# Patient Record
Sex: Female | Born: 1960
Health system: Southern US, Community
[De-identification: ages and names within clinical notes are randomized; demographics above are authoritative.]

## PROBLEM LIST (undated history)

## (undated) DIAGNOSIS — T7840XA Allergy, unspecified, initial encounter: Secondary | ICD-10-CM

## (undated) DIAGNOSIS — M349 Systemic sclerosis, unspecified: Secondary | ICD-10-CM

## (undated) DIAGNOSIS — E559 Vitamin D deficiency, unspecified: Secondary | ICD-10-CM

## (undated) DIAGNOSIS — K219 Gastro-esophageal reflux disease without esophagitis: Secondary | ICD-10-CM

## (undated) DIAGNOSIS — L940 Localized scleroderma [morphea]: Secondary | ICD-10-CM

## (undated) DIAGNOSIS — K743 Primary biliary cirrhosis: Secondary | ICD-10-CM

## (undated) DIAGNOSIS — N920 Excessive and frequent menstruation with regular cycle: Secondary | ICD-10-CM

## (undated) DIAGNOSIS — M1990 Unspecified osteoarthritis, unspecified site: Secondary | ICD-10-CM

## (undated) DIAGNOSIS — I071 Rheumatic tricuspid insufficiency: Secondary | ICD-10-CM

## (undated) DIAGNOSIS — M358 Other specified systemic involvement of connective tissue: Secondary | ICD-10-CM

## (undated) DIAGNOSIS — Z9889 Other specified postprocedural states: Secondary | ICD-10-CM

## (undated) HISTORY — DX: Gastro-esophageal reflux disease without esophagitis: K21.9

## (undated) HISTORY — DX: Rheumatic tricuspid insufficiency: I07.1

## (undated) HISTORY — DX: Vitamin D deficiency, unspecified: E55.9

## (undated) HISTORY — DX: Other specified systemic involvement of connective tissue: M35.8

## (undated) HISTORY — DX: Localized scleroderma (morphea): L94.0

## (undated) HISTORY — PX: VEIN SURGERY: SHX48

## (undated) HISTORY — DX: Systemic sclerosis, unspecified: M34.9

## (undated) HISTORY — DX: Allergy, unspecified, initial encounter: T78.40XA

## (undated) HISTORY — DX: Primary biliary cirrhosis: K74.3

## (undated) HISTORY — DX: Excessive and frequent menstruation with regular cycle: N92.0

## (undated) HISTORY — DX: Other specified postprocedural states: Z98.890

---

## 1990-07-18 HISTORY — PX: DILATION AND CURETTAGE OF UTERUS: SHX78

## 1991-07-19 HISTORY — PX: DILATION AND CURETTAGE OF UTERUS: SHX78

## 2004-12-14 ENCOUNTER — Ambulatory Visit: Payer: Self-pay | Admitting: Internal Medicine

## 2005-07-14 ENCOUNTER — Ambulatory Visit: Payer: Self-pay | Admitting: Rheumatology

## 2005-12-29 ENCOUNTER — Ambulatory Visit: Payer: Self-pay | Admitting: Internal Medicine

## 2007-01-05 ENCOUNTER — Ambulatory Visit: Payer: Self-pay | Admitting: Internal Medicine

## 2008-01-09 ENCOUNTER — Ambulatory Visit: Payer: Self-pay | Admitting: Internal Medicine

## 2008-05-28 ENCOUNTER — Ambulatory Visit: Payer: Self-pay | Admitting: Internal Medicine

## 2009-01-13 ENCOUNTER — Ambulatory Visit: Payer: Self-pay | Admitting: Internal Medicine

## 2009-05-12 ENCOUNTER — Ambulatory Visit: Payer: Self-pay | Admitting: Unknown Physician Specialty

## 2009-05-15 ENCOUNTER — Ambulatory Visit: Payer: Self-pay | Admitting: Unknown Physician Specialty

## 2009-07-18 HISTORY — PX: DILATION AND CURETTAGE OF UTERUS: SHX78

## 2010-01-19 ENCOUNTER — Ambulatory Visit: Payer: Self-pay | Admitting: Unknown Physician Specialty

## 2011-02-01 ENCOUNTER — Ambulatory Visit: Payer: Self-pay | Admitting: Unknown Physician Specialty

## 2012-02-07 ENCOUNTER — Ambulatory Visit: Payer: Self-pay | Admitting: Internal Medicine

## 2012-02-09 ENCOUNTER — Ambulatory Visit: Payer: Self-pay | Admitting: Internal Medicine

## 2012-02-28 LAB — PULMONARY FUNCTION TEST

## 2012-04-06 ENCOUNTER — Ambulatory Visit: Payer: Self-pay | Admitting: Unknown Physician Specialty

## 2012-07-17 ENCOUNTER — Encounter: Payer: Self-pay | Admitting: Internal Medicine

## 2012-07-17 ENCOUNTER — Ambulatory Visit (INDEPENDENT_AMBULATORY_CARE_PROVIDER_SITE_OTHER): Payer: BC Managed Care – PPO | Admitting: Internal Medicine

## 2012-07-17 VITALS — BP 102/60 | HR 66 | Temp 97.8°F | Ht 64.0 in | Wt 177.2 lb

## 2012-07-17 DIAGNOSIS — I7300 Raynaud's syndrome without gangrene: Secondary | ICD-10-CM

## 2012-07-17 DIAGNOSIS — M349 Systemic sclerosis, unspecified: Secondary | ICD-10-CM

## 2012-07-18 ENCOUNTER — Encounter: Payer: Self-pay | Admitting: Internal Medicine

## 2012-07-18 NOTE — Progress Notes (Signed)
  Subjective:    Patient ID: Denise Arnold, female    DOB: May 02, 1961, 52 y.o.   MRN: [id number]  HPI 52 year old female with past history of scleroderma and raynaud's who comes in today for a scheduled follow up.  She states she is seeing Dr Janene Harvey.  Up to date with her breast, pelvic and pap smears.  Vitamin D was low.  On supplemental vitamin D.  Had her mammogram 7/13 and follow up views recommended.  Has her six month follow up 08/14/12.  She recently had an ulcer on her right third finger.  Saw Dr Gavin Potters.  Norvasc was increased to bid and she was placed on nitroglycerin cream.  The ulceration has improved.  Has follow up appt with Dr Gavin Potters 08/07/12.  Breathing test recently evaluated - ok.  Saw Dr Meredeth Ide.  Is being followed at [facility].  Has follow up 07/31/12.  Overall she feels she is doing well.    Past Medical History  Diagnosis Date  . Scleroderma     positive FANA, positive SCL-70 abs, raynauds, mild pulmonary hypertension, normal DLCO  . GERD (gastroesophageal reflux disease)   . Tricuspid regurgitation     Outpatient Encounter Prescriptions as of 07/17/2012  Medication Sig Dispense Refill  . amLODipine (NORVASC) 5 MG tablet Take 5 mg by mouth 2 (two) times daily.      . Calcium-Magnesium-Vitamin D (CALCIUM 500 PO) Take by mouth.      . fish oil-omega-3 fatty acids 1000 MG capsule Take 1 g by mouth daily.      . Multiple Vitamin (MULTIVITAMIN) tablet Take 1 tablet by mouth daily.      . nitroGLYCERIN (NITRO-BID) 2 % ointment Place 0.5 inches onto the skin 3 (three) times daily.        Review of Systems Patient denies any headache, lightheadedness or dizziness.  No sinus or allergy symptoms.   No chest pain, tightness or palpatitions.  No increased shortness of breath, cough or congestion.  No nausea or vomiting.  No acid reflux.  No abdominal pain or cramping.  No bowel change, such as diarrhea, constipation, BRBPR or melana.  No urine change.        Objective:   Physical Exam  Filed Vitals:   07/17/12 0932  BP: 102/60  Pulse: 66  Temp: 97.8 F (22.36 C)    52 year old female in no acute distress.   HEENT:  Nares - clear.  OP- without lesions or erythema.  NECK:  Supple, nontender.  No audible bruit.   HEART:  Appears to be regular. LUNGS:  Without crackles or wheezing audible.  Respirations even and unlabored.   RADIAL PULSE:  Equal bilaterally.  ABDOMEN:  Soft, nontender.  No audible abdominal bruit.   EXTREMITIES:  No increased edema to be present.  Healing ulcer right third finger.                     Assessment & Plan:  ABNORMAL MAMMOGRAM.  Has a six month follow up mammo 08/14/12.    LEG LESION.  Right leg.  She plans to discuss with her dermatologist at her 07/31/12 appt.    VASCULAR.  Saw Dr Guss Bunde.  Doing well.  Continue support hose.    GYN.  Sees Dr Janene Harvey.  Up to date with pelvic/breast exams.  Obtain records.   HEALTH MAINTENANCE.  Gyn through [place].  mammo as outlined.  Had colonoscopy 7-8/13.  Obtain records.

## 2012-07-18 NOTE — Assessment & Plan Note (Signed)
Sees Dr Gavin Potters.  Has the finger ulceration.  Improved.  Continue bid norvasc.  Continue nitroglycerin cream.  Improved.  Keep follow up with Dr Gavin Potters.

## 2012-07-18 NOTE — Assessment & Plan Note (Signed)
Following with Dr Kernodle.  Just evaluated by Dr Fleming.  Breathing test ok per pt.  Follow.     

## 2012-08-07 ENCOUNTER — Encounter: Payer: Self-pay | Admitting: Internal Medicine

## 2012-08-22 ENCOUNTER — Telehealth: Payer: Self-pay | Admitting: Internal Medicine

## 2012-08-22 ENCOUNTER — Ambulatory Visit: Payer: Self-pay | Admitting: Unknown Physician Specialty

## 2012-08-22 NOTE — Telephone Encounter (Signed)
Dr. Hyacinth Meeker put in the referral for the patient's mammogram and her results will be sent to [facility] . Patient's wants to make sure you request her mammogram report in order to receive the results from Dr. Lorin Picket.

## 2012-08-23 NOTE — Telephone Encounter (Signed)
Call [facility] to fax over results.

## 2012-08-27 ENCOUNTER — Telehealth: Payer: Self-pay | Admitting: Internal Medicine

## 2012-08-27 DIAGNOSIS — Z1239 Encounter for other screening for malignant neoplasm of breast: Secondary | ICD-10-CM

## 2012-08-27 DIAGNOSIS — Z1211 Encounter for screening for malignant neoplasm of colon: Secondary | ICD-10-CM

## 2012-08-27 NOTE — Telephone Encounter (Signed)
Patient will need a repeat mammogram done in 6 months from 2.5.14. She will need an order put in for her repeat mammogram and her report requested from [place].

## 2012-08-27 NOTE — Telephone Encounter (Signed)
Need to schedule mammo - follow up.

## 2012-08-27 NOTE — Telephone Encounter (Signed)
Please schedule her a 2 month follow up appt.  Can cancel tomorrow's appt.  Thanks.

## 2012-08-28 NOTE — Telephone Encounter (Signed)
Pt had a six month follow up mammo and ultrasound 08/22/12.  Read as BiRADS II.  Recommended return to yearly mammo - which would make her due for bilateral mammo 7/14.  Order placed for mammo.  Please schedule and let pt know.  Thanks.

## 2012-09-05 ENCOUNTER — Telehealth: Payer: Self-pay | Admitting: Internal Medicine

## 2012-09-05 NOTE — Telephone Encounter (Signed)
Pt left message checking on her 46month mammogram appointment

## 2012-09-05 NOTE — Telephone Encounter (Signed)
Patient is concerned she has a pinched nerve and would like to be seen. I have made her an apt on 3/7. Can we get her in any sooner? The patient is also concerned about her mammo.

## 2012-09-07 NOTE — Telephone Encounter (Signed)
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

## 2012-09-07 NOTE — Telephone Encounter (Signed)
I can see her on 09/14/12 at 2:00 - block 30 minutes.  Let me know if any problem with that appt time.  Confirm does not need to be seen earlier.

## 2012-09-07 NOTE — Telephone Encounter (Signed)
I left msg on home phone asking patient to return my call I was going to offer her an appointment for Monday 09/10/12.

## 2012-09-08 NOTE — Telephone Encounter (Signed)
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

## 2012-09-10 NOTE — Telephone Encounter (Signed)
Pt called and says something around 4 pm would be good if possible

## 2012-09-10 NOTE — Telephone Encounter (Signed)
See if she can come in on Tuesday 09/11/12 at 4:15.  She may have to wait.  Will just add her in at the end of the day.  Thanks.

## 2012-09-10 NOTE — Telephone Encounter (Signed)
Left message for pt to call office

## 2012-09-11 ENCOUNTER — Ambulatory Visit (INDEPENDENT_AMBULATORY_CARE_PROVIDER_SITE_OTHER): Payer: BC Managed Care – PPO | Admitting: Internal Medicine

## 2012-09-11 ENCOUNTER — Encounter: Payer: Self-pay | Admitting: Internal Medicine

## 2012-09-11 VITALS — BP 138/82 | HR 92 | Temp 97.9°F | Wt 175.0 lb

## 2012-09-11 DIAGNOSIS — M349 Systemic sclerosis, unspecified: Secondary | ICD-10-CM

## 2012-09-11 DIAGNOSIS — I7300 Raynaud's syndrome without gangrene: Secondary | ICD-10-CM

## 2012-09-12 ENCOUNTER — Telehealth: Payer: Self-pay | Admitting: Internal Medicine

## 2012-09-12 DIAGNOSIS — R928 Other abnormal and inconclusive findings on diagnostic imaging of breast: Secondary | ICD-10-CM

## 2012-09-12 NOTE — Telephone Encounter (Signed)
Patient needing a referral for a general surgeon.

## 2012-09-12 NOTE — Telephone Encounter (Signed)
Order placed for surgery referral for abnormal mammo

## 2012-09-20 ENCOUNTER — Ambulatory Visit: Payer: BC Managed Care – PPO | Admitting: Internal Medicine

## 2012-09-21 ENCOUNTER — Ambulatory Visit: Payer: BC Managed Care – PPO | Admitting: Internal Medicine

## 2012-09-23 ENCOUNTER — Encounter: Payer: Self-pay | Admitting: Internal Medicine

## 2012-09-23 NOTE — Assessment & Plan Note (Signed)
Following with Dr Gavin Potters.  Just evaluated by Dr Meredeth Ide.  Breathing test ok per pt.  Follow.

## 2012-09-23 NOTE — Progress Notes (Signed)
Subjective:    Patient ID: Denise Arnold, female    DOB: 1960/12/21, 52 y.o.   MRN: [id number]  Sinusitis  52 year old female with past history of scleroderma and raynaud's who comes in today as a work in with concerns regarding some neck and arm discomfort and some numbness.  She states she works on a [employer]im[employer] a lot.  Looks down.  If the turns her head, notices some left arm numbness.  Seeing a [facility].  Helping.  She also had questions and concerns regarding her mammo.  This was a six month follow up.  Read as a Birads II and recommended getting her back in for yearly mammo - which would be done in six months.  No change in her breast.  Otherwise doing well.       Past Medical History  Diagnosis Date  . Scleroderma     positive FANA, positive SCL-70 abs, raynauds, mild pulmonary hypertension, normal DLCO  . GERD (gastroesophageal reflux disease)   . Tricuspid regurgitation     Outpatient Encounter Prescriptions as of 09/11/2012  Medication Sig Dispense Refill  . omeprazole (PRILOSEC) 20 MG capsule Take 20 mg by mouth daily.      Marland Kitchen amLODipine (NORVASC) 5 MG tablet Take 5 mg by mouth 2 (two) times daily.      . Calcium-Magnesium-Vitamin D (CALCIUM 500 PO) Take by mouth.      . fish oil-omega-3 fatty acids 1000 MG capsule Take 1 g by mouth daily.      . Multiple Vitamin (MULTIVITAMIN) tablet Take 1 tablet by mouth daily.      . nitroGLYCERIN (NITRO-BID) 2 % ointment Place 0.5 inches onto the skin 3 (three) times daily.       No facility-administered encounter medications on file as of 09/11/2012.    Review of Systems Patient denies any headache, lightheadedness or dizziness.  No sinus or allergy symptoms.   No chest pain, tightness or palpatitions.  No increased shortness of breath, cough or congestion.  No nausea or vomiting.  No acid reflux.  No abdominal pain or cramping.  No bowel change, such as diarrhea, constipation, BRBPR or melana.  No urine change.  Arm pain and numbness as  outlined.        Objective:   Physical Exam   Filed Vitals:   09/11/12 1658  BP: 138/82  Pulse: 92  Temp: 97.9 F (36.9 C)   52 year old female in no acute distress.   HEENT:  Nares - clear.  OP- without lesions or erythema.  NECK:  Supple, nontender.  No audible bruit.  Increased pulling sensation with rotation of her head. HEART:  Appears to be regular. LUNGS:  Without crackles or wheezing audible.  Respirations even and unlabored.   RADIAL PULSE:  Equal bilaterally.  ABDOMEN:  Soft, nontender.  No audible abdominal bruit.   EXTREMITIES:  No increased edema to be present.  Healing ulcer right third finger.              MSK:  Grip strength normal.  No significant pain with full extension.         Assessment & Plan:  ABNORMAL MAMMOGRAM.  Just had follow up six month mammo.  Read as benign and recommended another six month follow up- which would be her bilateral mammo.  Discussed findings with patient today.  She is anxious.  Request referral to Dr Lemar Livings for his opinion regarding her mammogram.    ARM PAIN/NUMBNESS.  Symptoms and  exam as outlined.  Improving with seeing chiropractor.  Follow.  Will hold on further w/up at this point.  Will notify me if symptoms worsen or change or if she desires any further w/up.    VASCULAR.  Saw Dr Guss Bunde.  Doing well.  Continue support hose.    GYN.  Sees Dr Janene Harvey.  Up to date with pelvic/breast exams.  Obtain records.   HEALTH MAINTENANCE.  Gyn through [facility].  Mammo as outlined.  Had colonoscopy 7-8/13.  Obtain records.

## 2012-09-23 NOTE — Assessment & Plan Note (Signed)
Sees Dr Kernodle.  Has the finger ulceration.  Improved.  Continue bid norvasc.  Continue nitroglycerin cream.  Improved.  Keep follow up with Dr Kernodle.  

## 2012-10-01 ENCOUNTER — Telehealth: Payer: Self-pay | Admitting: Internal Medicine

## 2012-10-01 DIAGNOSIS — M542 Cervicalgia: Secondary | ICD-10-CM

## 2012-10-01 NOTE — Telephone Encounter (Signed)
Patient wanting a referral to a physical therapist . She has gone to [facility] on [address].

## 2012-10-01 NOTE — Telephone Encounter (Signed)
Left message to return call, concerning why patient needs physical therapy.

## 2012-10-01 NOTE — Telephone Encounter (Signed)
Discussed this the last time she was in, she has a pinched nerve in her neck and it goes down the left side of her body. Said she mentioned that she had been to a chiropractor but that did not seem to help but thought physical therapy would be better for her. Admit it did get better but over the wknd it has gotten and think physical therapy could do more for her.

## 2012-10-02 NOTE — Telephone Encounter (Signed)
Patient notified. Denise Arnold patient stated that she prefers to go back to [facility] on [address] [place]. Thanks

## 2012-10-02 NOTE — Telephone Encounter (Signed)
Order placed for physical therapy.   Please notify pt - she should be called with an appt time.

## 2012-10-03 ENCOUNTER — Telehealth: Payer: Self-pay | Admitting: Internal Medicine

## 2012-10-03 NOTE — Telephone Encounter (Signed)
Pt will come in Friday 3/21 @ 4.  Pt wanted to know if there was anything to suggest over the counter for pain until Friday  And patient stated PT was going to call her back this after  [facility]

## 2012-10-03 NOTE — Telephone Encounter (Signed)
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  Was given prednisone a few weeks ago and that helped but in the last day or two she is having a lot of pain again in her left side of her neck.  Would like advice on another medication she could try.

## 2012-10-03 NOTE — Telephone Encounter (Signed)
Given that her symptoms have flared again, I would like to reevaluate her to see what is next best step.  See if she can come in 4:00 on Friday 10/05/12 for evaluation.

## 2012-10-03 NOTE — Telephone Encounter (Signed)
Pt will be here frdiay 3/21

## 2012-10-03 NOTE — Telephone Encounter (Signed)
Can try tylenol extra strength - 2 tablets bid and advil or ibuprofen - 2 tablets q day to bid - take with food.

## 2012-10-04 NOTE — Telephone Encounter (Signed)
Leave patient a message she will be in class this afternoon. She is returning your call.

## 2012-10-04 NOTE — Telephone Encounter (Signed)
Left detailed message on patients voice mail per patient's request.

## 2012-10-04 NOTE — Telephone Encounter (Signed)
Left message for patient to return call on voicemail. 

## 2012-10-05 ENCOUNTER — Ambulatory Visit (INDEPENDENT_AMBULATORY_CARE_PROVIDER_SITE_OTHER): Payer: BC Managed Care – PPO | Admitting: Internal Medicine

## 2012-10-05 ENCOUNTER — Encounter: Payer: Self-pay | Admitting: Internal Medicine

## 2012-10-05 VITALS — BP 110/60 | HR 73 | Temp 98.4°F | Ht 64.0 in | Wt 178.5 lb

## 2012-10-05 DIAGNOSIS — M349 Systemic sclerosis, unspecified: Secondary | ICD-10-CM

## 2012-10-05 DIAGNOSIS — M79609 Pain in unspecified limb: Secondary | ICD-10-CM

## 2012-10-05 DIAGNOSIS — M542 Cervicalgia: Secondary | ICD-10-CM

## 2012-10-05 DIAGNOSIS — M79602 Pain in left arm: Secondary | ICD-10-CM

## 2012-10-05 MED ORDER — CYCLOBENZAPRINE HCL 5 MG PO TABS: 5.0000 mg | ORAL_TABLET | Freq: Every evening | ORAL | Status: DC | PRN

## 2012-10-07 ENCOUNTER — Encounter: Payer: Self-pay | Admitting: Internal Medicine

## 2012-10-07 NOTE — Progress Notes (Signed)
Subjective:    Patient ID: Denise Arnold, female    DOB: 10/27/1960, 52 y.o.   MRN: [id number]  Neck Pain   Sinusitis Associated symptoms include neck pain.  52 year old female with past history of scleroderma and raynaud's who comes in today as a work in with concerns regarding some neck and arm discomfort and some numbness.  She works on a Animator a lot.  Looks down.  If the turns her head, notices some left arm numbness and pain with looking up and looking to the side.  Has seen a chiropractor.  I saw her last visit for the neck and arm pain.  See that note for details.  Was given medrol dose pack.  This helped for a while and now has flared back up again.  More pain localized in her left upper arm and into her axilla.      Past Medical History  Diagnosis Date  . Scleroderma     positive FANA, positive SCL-70 abs, raynauds, mild pulmonary hypertension, normal DLCO  . GERD (gastroesophageal reflux disease)   . Tricuspid regurgitation     Outpatient Encounter Prescriptions as of 10/05/2012  Medication Sig Dispense Refill  . amLODipine (NORVASC) 5 MG tablet Take 5 mg by mouth 2 (two) times daily.      . Calcium-Magnesium-Vitamin D (CALCIUM 500 PO) Take by mouth.      . cyclobenzaprine (FLEXERIL) 5 MG tablet Take 1 tablet (5 mg total) by mouth at bedtime as needed for muscle spasms.  20 tablet  0  . fish oil-omega-3 fatty acids 1000 MG capsule Take 1 g by mouth daily.      . Multiple Vitamin (MULTIVITAMIN) tablet Take 1 tablet by mouth daily.      . nitroGLYCERIN (NITRO-BID) 2 % ointment Place 0.5 inches onto the skin 3 (three) times daily.      Marland Kitchen omeprazole (PRILOSEC) 20 MG capsule Take 20 mg by mouth daily.      . [DISCONTINUED] cyclobenzaprine (FLEXERIL) 5 MG tablet Take 5 mg by mouth at bedtime as needed for muscle spasms.       No facility-administered encounter medications on file as of 10/05/2012.    Review of Systems  HENT: Positive for neck pain.   Patient denies any headache,  lightheadedness or dizziness.  No sinus or allergy symptoms.   No chest pain, tightness or palpatitions.  No increased shortness of breath, cough or congestion.  No nausea or vomiting.  No acid reflux.  No abdominal pain or cramping.  No bowel change, such as diarrhea, constipation, BRBPR or melana.  No urine change.  Arm pain and numbness as outlined as well as neck and shoulder pain as outlined.  Hurts more with certain positions- i.e., turning her head and looking up, etc.        Objective:   Physical Exam   Filed Vitals:   10/05/12 1604  BP: 110/60  Pulse: 73  Temp: 98.4 F (65.64 C)   52 year old female in no acute distress.   HEENT:  Nares - clear.  OP- without lesions or erythema.  NECK:  Supple, nontender.  No audible bruit.  Increased pulling sensation with rotation of her head.  Increased pain in her left shoulder with looking up.  Increased pain in the left upper arm with looking from left to right.  HEART:  Appears to be regular. LUNGS:  Without crackles or wheezing audible.  Respirations even and unlabored.   RADIAL PULSE:  Equal  bilaterally.  ABDOMEN:  Soft, nontender.  No audible abdominal bruit.   EXTREMITIES:  No increased edema to be present.  Healing ulcer right third finger.              MSK:  Grip strength normal.  No significant pain with full extension.          Assessment & Plan:  ABNORMAL MAMMOGRAM.  Just had follow up six month mammo.  Read as benign and recommended another six month follow up- which would be her bilateral mammo.  See last note for details. Referred to Dr Lemar Livings.    ARM PAIN/NUMBNESS.  Symptoms and exam as outlined.  Improved with steroids.  Will give her another medrol dose pack.  Also, flexeril 5mg  q hs prn.  Will obtain MRI c-spine.  Review this prior to PT.    VASCULAR.  Saw Dr Guss Bunde.  Doing well.  Continue support hose.    GYN.  Sees Dr Janene Harvey.  Up to date with pelvic/breast exams.  Obtain records.   HEALTH MAINTENANCE.  Gyn  through [place].  Mammo as outlined.  Had colonoscopy 7-8/13.

## 2012-10-07 NOTE — Assessment & Plan Note (Addendum)
Following with Dr Kernodle.  Stable.   

## 2012-10-20 ENCOUNTER — Ambulatory Visit: Payer: Self-pay | Admitting: Internal Medicine

## 2012-10-24 ENCOUNTER — Telehealth: Payer: Self-pay | Admitting: Internal Medicine

## 2012-10-24 DIAGNOSIS — M542 Cervicalgia: Secondary | ICD-10-CM

## 2012-10-24 NOTE — Telephone Encounter (Signed)
Order placed for neurosurgery referrral.

## 2012-11-06 ENCOUNTER — Encounter: Payer: Self-pay | Admitting: Internal Medicine

## 2013-01-11 ENCOUNTER — Encounter: Payer: BC Managed Care – PPO | Admitting: Internal Medicine

## 2013-01-14 ENCOUNTER — Encounter: Payer: Self-pay | Admitting: Internal Medicine

## 2013-01-14 ENCOUNTER — Ambulatory Visit (INDEPENDENT_AMBULATORY_CARE_PROVIDER_SITE_OTHER): Payer: BC Managed Care – PPO | Admitting: Internal Medicine

## 2013-01-14 VITALS — BP 110/70 | HR 71 | Temp 98.1°F | Ht 64.5 in | Wt 179.0 lb

## 2013-01-14 DIAGNOSIS — I7300 Raynaud's syndrome without gangrene: Secondary | ICD-10-CM

## 2013-01-14 DIAGNOSIS — K219 Gastro-esophageal reflux disease without esophagitis: Secondary | ICD-10-CM

## 2013-01-14 DIAGNOSIS — M5030 Other cervical disc degeneration, unspecified cervical region: Secondary | ICD-10-CM

## 2013-01-14 DIAGNOSIS — E559 Vitamin D deficiency, unspecified: Secondary | ICD-10-CM

## 2013-01-14 DIAGNOSIS — M349 Systemic sclerosis, unspecified: Secondary | ICD-10-CM

## 2013-01-15 ENCOUNTER — Telehealth: Payer: Self-pay | Admitting: Internal Medicine

## 2013-01-15 ENCOUNTER — Encounter: Payer: Self-pay | Admitting: Internal Medicine

## 2013-01-15 DIAGNOSIS — E559 Vitamin D deficiency, unspecified: Secondary | ICD-10-CM | POA: Insufficient documentation

## 2013-01-15 DIAGNOSIS — M5030 Other cervical disc degeneration, unspecified cervical region: Secondary | ICD-10-CM | POA: Insufficient documentation

## 2013-01-15 DIAGNOSIS — K219 Gastro-esophageal reflux disease without esophagitis: Secondary | ICD-10-CM | POA: Insufficient documentation

## 2013-01-15 NOTE — Assessment & Plan Note (Signed)
On replacement.  Obtain lab results from Dr Janene Harvey.

## 2013-01-15 NOTE — Assessment & Plan Note (Signed)
Will increase omeprazole to bid.  Describes the intermittent swallowing issues.  Plans to discuss with Dr Gavin Potters.  Discussed with her regarding swallowing evaluation - especially given her history of scleroderma.  She prefers to discuss with Dr Gavin Potters.  Has an appt with him 7/14.

## 2013-01-15 NOTE — Assessment & Plan Note (Signed)
Neck and shoulder doing better.  Traction prn helps.  Saw Dr Stern.  Refer to his note for details.   

## 2013-01-15 NOTE — Assessment & Plan Note (Signed)
Sees Dr Kernodle.  Has the finger ulceration.  Improved.  Continue bid norvasc.  Keep follow up with Dr Kernodle.   

## 2013-01-15 NOTE — Assessment & Plan Note (Signed)
Following with Dr Kernodle.  Stable.   

## 2013-01-15 NOTE — Telephone Encounter (Signed)
Records received per Dr. Lorin Picket

## 2013-01-15 NOTE — Progress Notes (Signed)
Subjective:    Patient ID: Denise Arnold, female    DOB: 16-Jun-1961, 52 y.o.   MRN: [id number]  HPI 52 year old female with past history of scleroderma and raynaud's who comes in today for a scheduled follow up.  She states she is seeing Dr Janene Harvey.  Up to date with her breast, pelvic and pap smears.  Just evaluated 12/31/12.  Vitamin D was low.  On supplemental vitamin D.  Is scheduled to have her mammogram in 8/14.  She has an ulcer on her right third finger.  Saw Dr Gavin Potters.  Norvasc was increased to bid and she was placed on nitroglycerin cream.  The ulceration has improved, but still present.  Breathing stable.  Occasionally will notice some change with her swallowing.  No significant dysphagia.  Occasional reflux.  On prilosec q day.  Planning to discuss with Dr Gavin Potters.  We discussed further swallowing evaluation, given her history of scleroderma.  She prefers to hold on further testing and discuss with Dr Gavin Potters.  Has follow up planned with Dr Gavin Potters 7/14. Neck/arm better.  Saw neurosurgery (Dr Venetia Maxon).  Recommended traction.  Does this intermittently.  Doing better.      Past Medical History  Diagnosis Date  . Scleroderma     positive FANA, positive SCL-70 abs, raynauds, mild pulmonary hypertension, normal DLCO  . GERD (gastroesophageal reflux disease)   . Tricuspid regurgitation     Outpatient Encounter Prescriptions as of 01/14/2013  Medication Sig Dispense Refill  . amLODipine (NORVASC) 5 MG tablet Take 5 mg by mouth daily.       . Calcium-Magnesium-Vitamin D (CALCIUM 500 PO) Take by mouth.      . fish oil-omega-3 fatty acids 1000 MG capsule Take 1 g by mouth daily.      . Multiple Vitamin (MULTIVITAMIN) tablet Take 1 tablet by mouth daily.      Marland Kitchen omeprazole (PRILOSEC) 20 MG capsule Take 20 mg by mouth daily.      . Vitamin D, Ergocalciferol, (DRISDOL) 50000 UNITS CAPS Take 50,000 Units by mouth every 7 (seven) days.      . [DISCONTINUED] cyclobenzaprine (FLEXERIL) 5 MG tablet Take  1 tablet (5 mg total) by mouth at bedtime as needed for muscle spasms.  20 tablet  0  . [DISCONTINUED] nitroGLYCERIN (NITRO-BID) 2 % ointment Place 0.5 inches onto the skin 3 (three) times daily.       No facility-administered encounter medications on file as of 01/14/2013.    Review of Systems Patient denies any headache, lightheadedness or dizziness.  No sinus or allergy symptoms.   No chest pain, tightness or palpatitions.  No increased shortness of breath, cough or congestion.  No nausea or vomiting.  Minimal acid reflux.  Some occasional swallowing change as outlined.  No abdominal pain or cramping.  No bowel change, such as diarrhea, constipation, BRBPR or melana.  No urine change.  Neck/arm pain better.  Traction intermittently - helps.       Objective:   Physical Exam   Filed Vitals:   01/14/13 1336  BP: 110/70  Pulse: 71  Temp: 98.1 F (26.60 C)    52 year old female in no acute distress.   HEENT:  Nares - clear.  OP- without lesions or erythema.  NECK:  Supple, nontender.  No audible bruit.   HEART:  Appears to be regular. LUNGS:  Without crackles or wheezing audible.  Respirations even and unlabored.   RADIAL PULSE:  Equal bilaterally.  ABDOMEN:  Soft,  nontender.  No audible abdominal bruit.   EXTREMITIES:  No increased edema to be present.  Healing ulcer right third finger.                     Assessment & Plan:  ABNORMAL MAMMOGRAM.  Had a six month follow up mammo 08/14/12 - ok.  Is scheduled for her bilateral mammogram - 8/14.    VASCULAR.  Saw Dr Guss Bunde.  Doing well.  Continue support hose.    GYN.  Sees Dr Janene Harvey.  Up to date with pelvic/breast exams.  Last pap 12/31/12.   HEALTH MAINTENANCE.  Gyn through [facility].  Mammogram as outlined.  Had colonoscopy 02/15/12.

## 2013-01-15 NOTE — Telephone Encounter (Signed)
Need lab results from [facility].  Saw Dr Janene Harvey.  Also need last pap results.

## 2013-01-17 ENCOUNTER — Telehealth: Payer: Self-pay | Admitting: *Deleted

## 2013-01-17 ENCOUNTER — Other Ambulatory Visit: Payer: Self-pay | Admitting: Internal Medicine

## 2013-01-17 DIAGNOSIS — E559 Vitamin D deficiency, unspecified: Secondary | ICD-10-CM

## 2013-01-17 NOTE — Telephone Encounter (Signed)
Pt informed & will call back at a later date to schedule lab appt.

## 2013-01-17 NOTE — Telephone Encounter (Signed)
Pt called to check on her records/results received from [facility]-she never heard from her pap results & wanted to be sure everything was okay

## 2013-01-17 NOTE — Telephone Encounter (Signed)
Notify pt that I received and reviewed her records from [facility].  Her pap was ok.  Her vitamin D was a little low.  My understanding is that they put her on vit d replacement (she is taking now).  I would like to recheck a vitamin d level on her in 3 months.  Please schedule her for a non fasting lab appt in 3 months.

## 2013-01-17 NOTE — Progress Notes (Signed)
Order placed for f/u vit d lab

## 2013-01-31 ENCOUNTER — Other Ambulatory Visit: Payer: Self-pay | Admitting: Rheumatology

## 2013-01-31 DIAGNOSIS — R1310 Dysphagia, unspecified: Secondary | ICD-10-CM

## 2013-01-31 DIAGNOSIS — M349 Systemic sclerosis, unspecified: Secondary | ICD-10-CM

## 2013-02-06 ENCOUNTER — Ambulatory Visit
Admission: RE | Admit: 2013-02-06 | Discharge: 2013-02-06 | Disposition: A | Discharge status: Home / Self Care | Payer: BC Managed Care – PPO | Source: Ambulatory Visit | Attending: Rheumatology | Admitting: Rheumatology

## 2013-02-06 DIAGNOSIS — R1310 Dysphagia, unspecified: Secondary | ICD-10-CM

## 2013-02-06 DIAGNOSIS — M349 Systemic sclerosis, unspecified: Secondary | ICD-10-CM

## 2013-02-18 ENCOUNTER — Ambulatory Visit: Payer: Self-pay | Admitting: Internal Medicine

## 2013-02-25 ENCOUNTER — Encounter: Payer: Self-pay | Admitting: Internal Medicine

## 2013-02-25 DIAGNOSIS — K219 Gastro-esophageal reflux disease without esophagitis: Secondary | ICD-10-CM

## 2013-02-25 DIAGNOSIS — Z86010 Personal history of colonic polyps: Secondary | ICD-10-CM

## 2013-03-01 ENCOUNTER — Encounter: Payer: Self-pay | Admitting: Internal Medicine

## 2013-04-05 ENCOUNTER — Ambulatory Visit: Payer: Self-pay | Admitting: Unknown Physician Specialty

## 2013-04-05 DIAGNOSIS — Z9889 Other specified postprocedural states: Secondary | ICD-10-CM

## 2013-04-05 HISTORY — DX: Other specified postprocedural states: Z98.890

## 2013-04-09 LAB — PATHOLOGY REPORT

## 2013-04-16 ENCOUNTER — Ambulatory Visit: Payer: BC Managed Care – PPO | Admitting: Adult Health

## 2013-04-19 ENCOUNTER — Other Ambulatory Visit: Payer: BC Managed Care – PPO

## 2013-04-19 ENCOUNTER — Ambulatory Visit: Payer: BC Managed Care – PPO

## 2013-04-22 DIAGNOSIS — Z86010 Personal history of colonic polyps: Secondary | ICD-10-CM | POA: Insufficient documentation

## 2013-04-26 ENCOUNTER — Ambulatory Visit (INDEPENDENT_AMBULATORY_CARE_PROVIDER_SITE_OTHER): Payer: BC Managed Care – PPO

## 2013-04-26 ENCOUNTER — Other Ambulatory Visit (INDEPENDENT_AMBULATORY_CARE_PROVIDER_SITE_OTHER): Payer: BC Managed Care – PPO

## 2013-04-26 DIAGNOSIS — Z23 Encounter for immunization: Secondary | ICD-10-CM

## 2013-04-26 DIAGNOSIS — E559 Vitamin D deficiency, unspecified: Secondary | ICD-10-CM

## 2013-04-27 ENCOUNTER — Encounter: Payer: Self-pay | Admitting: Internal Medicine

## 2013-05-03 ENCOUNTER — Encounter: Payer: Self-pay | Admitting: Internal Medicine

## 2013-05-08 ENCOUNTER — Encounter: Payer: Self-pay | Admitting: Internal Medicine

## 2013-05-13 ENCOUNTER — Encounter: Payer: Self-pay | Admitting: Internal Medicine

## 2013-05-16 ENCOUNTER — Encounter: Payer: Self-pay | Admitting: *Deleted

## 2013-07-15 ENCOUNTER — Ambulatory Visit (INDEPENDENT_AMBULATORY_CARE_PROVIDER_SITE_OTHER): Payer: BC Managed Care – PPO | Admitting: Internal Medicine

## 2013-07-15 ENCOUNTER — Telehealth: Payer: Self-pay | Admitting: *Deleted

## 2013-07-15 ENCOUNTER — Encounter: Payer: Self-pay | Admitting: Internal Medicine

## 2013-07-15 VITALS — BP 110/70 | HR 68 | Temp 98.1°F | Ht 64.5 in | Wt 177.5 lb

## 2013-07-15 DIAGNOSIS — Z01419 Encounter for gynecological examination (general) (routine) without abnormal findings: Secondary | ICD-10-CM

## 2013-07-15 DIAGNOSIS — R5381 Other malaise: Secondary | ICD-10-CM

## 2013-07-15 DIAGNOSIS — Z86010 Personal history of colonic polyps: Secondary | ICD-10-CM

## 2013-07-15 DIAGNOSIS — Z23 Encounter for immunization: Secondary | ICD-10-CM

## 2013-07-15 DIAGNOSIS — I7300 Raynaud's syndrome without gangrene: Secondary | ICD-10-CM

## 2013-07-15 DIAGNOSIS — M349 Systemic sclerosis, unspecified: Secondary | ICD-10-CM

## 2013-07-15 DIAGNOSIS — K219 Gastro-esophageal reflux disease without esophagitis: Secondary | ICD-10-CM

## 2013-07-15 DIAGNOSIS — E559 Vitamin D deficiency, unspecified: Secondary | ICD-10-CM

## 2013-07-15 DIAGNOSIS — Z13220 Encounter for screening for lipoid disorders: Secondary | ICD-10-CM

## 2013-07-15 DIAGNOSIS — M5030 Other cervical disc degeneration, unspecified cervical region: Secondary | ICD-10-CM

## 2013-07-15 DIAGNOSIS — R5383 Other fatigue: Secondary | ICD-10-CM

## 2013-07-15 NOTE — Progress Notes (Signed)
Pre-visit discussion using our clinic review tool. No additional management support is needed unless otherwise documented below in the visit note.  

## 2013-07-15 NOTE — Telephone Encounter (Signed)
Pt is coming in for labs tomorrow 12.30.2014 what labs and dx? 

## 2013-07-15 NOTE — Telephone Encounter (Signed)
Orders placed for labs

## 2013-07-15 NOTE — Progress Notes (Signed)
Subjective:    Patient ID: Denise Arnold, female    DOB: 1960-07-26, 52 y.o.   MRN: [id number]  HPI 52 year old female with past history of scleroderma and raynaud's who comes in today for a scheduled follow up.  She had been seeing Dr Janene Harvey.  Up to date with her breast, pelvic and pap smears.  Last evaluated 12/31/12.  Vitamin D was low.  On supplemental vitamin D.  Mammogram 02/18/13 - Birads II.  She has had an ulcer on her right third finger.  Saw Dr Gavin Potters.  Norvasc was increased to bid and she was placed on nitroglycerin cream.  The ulceration has improved. Breathing stable.   On prilosec q day.  No acid reflux or problems swallowing reported.  Has follow up planned with Dr Gavin Potters today.  Neck/arm better.  Saw neurosurgery (Dr Venetia Maxon).  Recommended traction.  Does this intermittently.  Doing better.  Planning to retire soon.       Past Medical History  Diagnosis Date  . Scleroderma     positive FANA, positive SCL-70 abs, raynauds, mild pulmonary hypertension, normal DLCO  . GERD (gastroesophageal reflux disease)   . Tricuspid regurgitation   . History of endoscopy 04/05/2013    upper    Outpatient Encounter Prescriptions as of 07/15/2013  Medication Sig  . amLODipine (NORVASC) 5 MG tablet Take 5 mg by mouth 2 (two) times daily.   . Calcium-Magnesium-Vitamin D (CALCIUM 500 PO) Take by mouth.  . cholecalciferol (VITAMIN D) 1000 UNITS tablet Take 1,000 Units by mouth daily.  . fish oil-omega-3 fatty acids 1000 MG capsule Take 1 g by mouth daily.  . Multiple Vitamin (MULTIVITAMIN) tablet Take 1 tablet by mouth daily.  Marland Kitchen omeprazole (PRILOSEC) 20 MG capsule Take 20 mg by mouth 2 (two) times daily.   . [DISCONTINUED] Vitamin D, Ergocalciferol, (DRISDOL) 50000 UNITS CAPS Take 50,000 Units by mouth every 7 (seven) days.    Review of Systems Patient denies any headache, lightheadedness or dizziness.  No sinus or allergy symptoms.   No chest pain, tightness or palpatitions.  No increased  shortness of breath, cough or congestion.  No nausea or vomiting.  No acid reflux.  No swallowing changes reported.   No abdominal pain or cramping.  No bowel change, such as diarrhea, constipation, BRBPR or melana.  No urine change.  Neck/arm pain better.  Traction intermittently - helps.  Desires to see another gyn.  Has Mirena IUD.  Needs removed.       Objective:   Physical Exam   Filed Vitals:   07/15/13 1355  BP: 110/70  Pulse: 68  Temp: 98.1 F (49.6 C)   52 year old female in no acute distress.   HEENT:  Nares - clear.  OP- without lesions or erythema.  NECK:  Supple, nontender.  No audible bruit.   HEART:  Appears to be regular. LUNGS:  Without crackles or wheezing audible.  Respirations even and unlabored.   RADIAL PULSE:  Equal bilaterally.  ABDOMEN:  Soft, nontender.  No audible abdominal bruit.   EXTREMITIES:  No increased edema to be present.  Healing ulcer right third finger.                     Assessment & Plan:  ABNORMAL MAMMOGRAM.  Had a six month follow up mammo 08/14/12 - ok.  Had bilateral mammogram - 02/18/13 - Birads II.     VASCULAR.  Saw Dr Guss Bunde.  Doing well.  Continue  support hose.    GYN.  Sees Dr Janene Harvey.  Up to date with pelvic/breast exams.  Last pap 12/31/12.  Wants to see a different gyn.  Will refer to Dr Valentino Saxon.    HEALTH MAINTENANCE.  Gyn previously through [place].  Mammogram as outlined.  Had colonoscopy 02/15/12.

## 2013-07-16 ENCOUNTER — Other Ambulatory Visit (INDEPENDENT_AMBULATORY_CARE_PROVIDER_SITE_OTHER): Payer: BC Managed Care – PPO

## 2013-07-16 DIAGNOSIS — R5383 Other fatigue: Secondary | ICD-10-CM

## 2013-07-16 DIAGNOSIS — Z13220 Encounter for screening for lipoid disorders: Secondary | ICD-10-CM

## 2013-07-16 DIAGNOSIS — R5381 Other malaise: Secondary | ICD-10-CM

## 2013-07-16 LAB — CBC WITH DIFFERENTIAL/PLATELET
Basophils Relative: 0.4 % (ref 0.0–3.0)
Eosinophils Absolute: 0.1 10*3/uL (ref 0.0–0.7)
Eosinophils Relative: 2.1 % (ref 0.0–5.0)
Hemoglobin: 13.6 g/dL (ref 12.0–15.0)
MCHC: 34.2 g/dL (ref 30.0–36.0)
MCV: 89 fl (ref 78.0–100.0)
Monocytes Absolute: 0.4 10*3/uL (ref 0.1–1.0)
Neutro Abs: 3.6 10*3/uL (ref 1.4–7.7)
Neutrophils Relative %: 66.6 % (ref 43.0–77.0)
RBC: 4.47 Mil/uL (ref 3.87–5.11)
WBC: 5.4 10*3/uL (ref 4.5–10.5)

## 2013-07-16 LAB — COMPREHENSIVE METABOLIC PANEL
AST: 20 U/L (ref 0–37)
Albumin: 4 g/dL (ref 3.5–5.2)
Alkaline Phosphatase: 49 U/L (ref 39–117)
BUN: 13 mg/dL (ref 6–23)
Calcium: 9.2 mg/dL (ref 8.4–10.5)
Chloride: 106 mEq/L (ref 96–112)
Creatinine, Ser: 0.9 mg/dL (ref 0.4–1.2)
Glucose, Bld: 87 mg/dL (ref 70–99)
Potassium: 4.3 mEq/L (ref 3.5–5.1)

## 2013-07-16 LAB — LIPID PANEL
Cholesterol: 177 mg/dL (ref 0–200)
Triglycerides: 109 mg/dL (ref 0.0–149.0)

## 2013-07-16 LAB — TSH: TSH: 1.42 u[IU]/mL (ref 0.35–5.50)

## 2013-07-17 ENCOUNTER — Encounter: Payer: Self-pay | Admitting: Internal Medicine

## 2013-07-18 ENCOUNTER — Encounter: Payer: Self-pay | Admitting: Internal Medicine

## 2013-07-18 NOTE — Assessment & Plan Note (Signed)
Neck and shoulder doing better.  Traction prn helps.  Saw Dr Stern.  Refer to his note for details.   

## 2013-07-18 NOTE — Assessment & Plan Note (Signed)
On replacement.  Follow.  

## 2013-07-18 NOTE — Assessment & Plan Note (Signed)
Colonoscopy as outlined.  Recommended f/u colonoscopy 9/19.    

## 2013-07-18 NOTE — Assessment & Plan Note (Signed)
Sees Dr Kernodle.  Has the finger ulceration.  Improved.  Continue bid norvasc.  Keep follow up with Dr Kernodle.   

## 2013-07-18 NOTE — Assessment & Plan Note (Signed)
Currently doing well.  Follow.  

## 2013-07-18 NOTE — Assessment & Plan Note (Signed)
Following with Dr Kernodle.  Stable.   

## 2014-01-15 ENCOUNTER — Encounter: Payer: Self-pay | Admitting: Internal Medicine

## 2014-01-15 ENCOUNTER — Ambulatory Visit (INDEPENDENT_AMBULATORY_CARE_PROVIDER_SITE_OTHER): Payer: BC Managed Care – PPO | Admitting: Internal Medicine

## 2014-01-15 VITALS — BP 110/70 | HR 60 | Temp 98.2°F | Ht 64.25 in | Wt 176.0 lb

## 2014-01-15 DIAGNOSIS — K2100 Gastro-esophageal reflux disease with esophagitis, without bleeding: Secondary | ICD-10-CM

## 2014-01-15 DIAGNOSIS — R071 Chest pain on breathing: Secondary | ICD-10-CM

## 2014-01-15 DIAGNOSIS — E559 Vitamin D deficiency, unspecified: Secondary | ICD-10-CM

## 2014-01-15 DIAGNOSIS — I7300 Raynaud's syndrome without gangrene: Secondary | ICD-10-CM

## 2014-01-15 DIAGNOSIS — Z860100 Personal history of colon polyps, unspecified: Secondary | ICD-10-CM

## 2014-01-15 DIAGNOSIS — R0789 Other chest pain: Secondary | ICD-10-CM

## 2014-01-15 DIAGNOSIS — M5030 Other cervical disc degeneration, unspecified cervical region: Secondary | ICD-10-CM

## 2014-01-15 DIAGNOSIS — M349 Systemic sclerosis, unspecified: Secondary | ICD-10-CM

## 2014-01-15 NOTE — Progress Notes (Signed)
Pre visit review using our clinic review tool, if applicable. No additional management support is needed unless otherwise documented below in the visit note. 

## 2014-01-16 ENCOUNTER — Encounter: Payer: BC Managed Care – PPO | Admitting: Internal Medicine

## 2014-01-19 ENCOUNTER — Encounter: Payer: Self-pay | Admitting: Internal Medicine

## 2014-01-19 DIAGNOSIS — R0789 Other chest pain: Secondary | ICD-10-CM | POA: Insufficient documentation

## 2014-01-19 NOTE — Assessment & Plan Note (Signed)
Following with Dr Jefm Bryant.  Stable.

## 2014-01-19 NOTE — Assessment & Plan Note (Signed)
On replacement.  Follow.  

## 2014-01-19 NOTE — Assessment & Plan Note (Signed)
Sees Dr Jefm Bryant.  Has the finger ulceration.  Improved.  Continue bid norvasc.  Keep follow up with Dr Jefm Bryant.

## 2014-01-19 NOTE — Assessment & Plan Note (Signed)
Neck and shoulder doing better.  Traction prn helps.  Saw Dr Vertell Limber.  Refer to his note for details.

## 2014-01-19 NOTE — Assessment & Plan Note (Signed)
Currently doing well.  Follow.  

## 2014-01-19 NOTE — Progress Notes (Signed)
Subjective:    Patient ID: Denise Arnold, female    DOB: May 18, 1961, 53 y.o.   MRN: [id number]  HPI 53 year old female with past history of scleroderma and raynaud's who comes in today to follow up on these issues as well as for a complete physical exam.   She recently saw Dr Marcelline Mates.  Still has her IUD.  States labs checked.  Was checking to see if postmenopausal.  Mammogram 02/18/13 - Birads II.  She has had an ulcer on her right third finger.  Saw Dr Jefm Bryant.  Norvasc was increased to bid and she was placed on nitroglycerin cream.  The ulceration has improved. Breathing stable.   On prilosec q day.  No acid reflux or problems swallowing reported.  Neck/arm better.  Saw neurosurgery (Dr Vertell Limber).  Recommended traction.  Does this intermittently.  Doing better.  First day of retirement - today.  She has been exercising.  Has noticed over the last week some minimal discomfort in her left lateral chest wall.  Some minimal pain to palpation over this area.  Certain movement aggravates.       Past Medical History  Diagnosis Date  . Scleroderma     positive FANA, positive SCL-70 abs, raynauds, mild pulmonary hypertension, normal DLCO  . GERD (gastroesophageal reflux disease)   . Tricuspid regurgitation   . History of endoscopy 04/05/2013    upper    Outpatient Encounter Prescriptions as of 01/15/2014  Medication Sig  . amLODipine (NORVASC) 5 MG tablet Take 5 mg by mouth 2 (two) times daily.   . Calcium-Magnesium-Vitamin D (CALCIUM 500 PO) Take by mouth.  . cholecalciferol (VITAMIN D) 1000 UNITS tablet Take 1,000 Units by mouth daily.  . fish oil-omega-3 fatty acids 1000 MG capsule Take 1 g by mouth daily.  . Multiple Vitamin (MULTIVITAMIN) tablet Take 1 tablet by mouth daily.  Marland Kitchen omeprazole (PRILOSEC) 20 MG capsule Take 20 mg by mouth 2 (two) times daily.     Review of Systems Patient denies any headache, lightheadedness or dizziness.  No sinus or allergy symptoms.   No chest pain, tightness or  palpatitions.  No increased shortness of breath, cough or congestion.  No nausea or vomiting.  No acid reflux.  No swallowing changes reported.   No abdominal pain or cramping.  No bowel change, such as diarrhea, constipation, BRBPR or melana.  No urine change.  Neck/arm pain better.  Traction as needed.   Has Mirena IUD.  Saw gyn.  Left lateral chest discomfort with palpation and movement.  Started exercising recently.       Objective:   Physical Exam   Filed Vitals:   01/15/14 1538  BP: 110/70  Pulse: 60  Temp: 98.2 F (16.75 C)   53 year old female in no acute distress.   HEENT:  Nares- clear.  Oropharynx - without lesions. NECK:  Supple.  Nontender.  No audible bruit.  HEART:  Appears to be regular. LUNGS:  No crackles or wheezing audible.  Respirations even and unlabored.  RADIAL PULSE:  Equal bilaterally.    BREASTS:  No nipple discharge or nipple retraction present.  Could not appreciate any distinct nodules or axillary adenopathy.  Minimal discomfort - left lateral chest wall.  No rash.   ABDOMEN:  Soft, nontender.  Bowel sounds present and normal.  No audible abdominal bruit.  GU:  Performed by gyn.    EXTREMITIES:  No increased edema present.  DP pulses palpable and equal bilaterally.  Healing ulcer - finger.                 Assessment & Plan:  ABNORMAL MAMMOGRAM.  Had a six month follow up mammo 08/14/12 - ok.  Had bilateral mammogram - 02/18/13 - Birads II.   Is scheduled for a f/u mammogram 02/27/14.    VASCULAR.  Saw Dr Aleda Grana.  Doing well.  Continue support hose.    GYN.  Just saw Dr Marcelline Mates.  See above.     HEALTH MAINTENANCE.  Saw gyn recently.  Mammogram as outlined.  Had colonoscopy 02/15/12.

## 2014-01-19 NOTE — Assessment & Plan Note (Signed)
Reproducible discomfort on exam.  Exam as outlined.  Scheduled for mammogram in august as outlined.  Just started exercising.  Could have aggravated a muscle.  Avoid strenuous lifting.  Call if persistent pain or problems.

## 2014-01-19 NOTE — Assessment & Plan Note (Signed)
Colonoscopy as outlined.  Recommended f/u colonoscopy 9/19.

## 2014-01-20 ENCOUNTER — Telehealth: Payer: Self-pay | Admitting: Internal Medicine

## 2014-01-20 ENCOUNTER — Encounter: Payer: Self-pay | Admitting: Internal Medicine

## 2014-01-20 NOTE — Telephone Encounter (Signed)
Sent pt a My Chart message in response to her questions.  I will place labs in your box.  Please forward to Dr Precious Reel.  Thanks.

## 2014-01-20 NOTE — Telephone Encounter (Signed)
Pt was notified by her gyn of lab results.  Had questions about her cholesterol.  Please send her a copy of the [place].  I have already responded back to her questions.  She just needs a copy of the [facility] Lipid diet.  Please send to her.  Thanks.

## 2014-01-20 NOTE — Telephone Encounter (Signed)
Copy of lab results placed in your folder

## 2014-01-20 NOTE — Telephone Encounter (Signed)
Pt left vm, states she had test results sent from Dr. Marcelline Mates.  States she was told lipids were high and she asked them to send to Dr. Nicki Reaper so Dr. Nicki Reaper can take care of her.  Asking for a call with the plan.  States she sees Dr. Duard Brady today and would also like him to have those results.

## 2014-01-21 NOTE — Telephone Encounter (Signed)
Copy of diet mailed

## 2014-01-24 ENCOUNTER — Other Ambulatory Visit: Payer: Self-pay | Admitting: Internal Medicine

## 2014-01-24 NOTE — Telephone Encounter (Signed)
Pt responded to [employer] & message forwarded to Dr. Nicki Reaper

## 2014-02-27 ENCOUNTER — Ambulatory Visit: Payer: Self-pay | Admitting: Obstetrics and Gynecology

## 2014-02-27 LAB — HM MAMMOGRAPHY: HM Mammogram: NORMAL

## 2014-07-21 ENCOUNTER — Encounter: Payer: Self-pay | Admitting: Internal Medicine

## 2014-07-21 ENCOUNTER — Telehealth: Payer: Self-pay | Admitting: *Deleted

## 2014-07-21 ENCOUNTER — Ambulatory Visit (INDEPENDENT_AMBULATORY_CARE_PROVIDER_SITE_OTHER): Payer: Medicare Other | Admitting: Internal Medicine

## 2014-07-21 VITALS — BP 116/70 | HR 69 | Temp 97.9°F | Ht 64.25 in | Wt 171.5 lb

## 2014-07-21 DIAGNOSIS — Z86010 Personal history of colonic polyps: Secondary | ICD-10-CM

## 2014-07-21 DIAGNOSIS — M349 Systemic sclerosis, unspecified: Secondary | ICD-10-CM

## 2014-07-21 DIAGNOSIS — I7300 Raynaud's syndrome without gangrene: Secondary | ICD-10-CM

## 2014-07-21 DIAGNOSIS — K2100 Gastro-esophageal reflux disease with esophagitis, without bleeding: Secondary | ICD-10-CM

## 2014-07-21 DIAGNOSIS — L98499 Non-pressure chronic ulcer of skin of other sites with unspecified severity: Secondary | ICD-10-CM

## 2014-07-21 DIAGNOSIS — E7800 Pure hypercholesterolemia, unspecified: Secondary | ICD-10-CM

## 2014-07-21 MED ORDER — MUPIROCIN 2 % EX OINT: 1.0000 "application " | TOPICAL_OINTMENT | Freq: Two times a day (BID) | CUTANEOUS | Status: DC

## 2014-07-21 NOTE — Progress Notes (Signed)
Subjective:    Patient ID: Denise Arnold, female    DOB: 1961-05-26, 54 y.o.   MRN: [id number]  HPI 54 year old female with past history of scleroderma and raynaud's who comes in today for a scheduled follow up.  Had her IUD removed.  No period since.  Doing well.   Mammogram 02/18/13 - Birads II.  Had mammogram 2015.  States ok.  Need results.  She has had an ulcer on her right third finger.  Recently worsened.  Increased swelling and infection.  Saw Dr Jefm Bryant.  She is on  norvasc bid and she was placed on nitroglycerin cream.  Was placed on bactrim.  Had intolerance.  Then placed on zpak.  Completed yesterday.  Finger drained recently.  Decreased pain and swelling.  Breathing stable.   On prilosec q day.  No acid reflux or problems swallowing reported.   She has been exercising.  Enjoying retirement.       Past Medical History  Diagnosis Date  . Scleroderma     positive FANA, positive SCL-70 abs, raynauds, mild pulmonary hypertension, normal DLCO  . GERD (gastroesophageal reflux disease)   . Tricuspid regurgitation   . History of endoscopy 04/05/2013    upper    Outpatient Encounter Prescriptions as of 07/21/2014  Medication Sig  . amLODipine (NORVASC) 5 MG tablet Take 5 mg by mouth 3 (three) times daily.   . Calcium-Magnesium-Vitamin D (CALCIUM 500 PO) Take by mouth.  . cholecalciferol (VITAMIN D) 1000 UNITS tablet Take 1,000 Units by mouth daily.  . fish oil-omega-3 fatty acids 1000 MG capsule Take 1 g by mouth daily.  . Multiple Vitamin (MULTIVITAMIN) tablet Take 1 tablet by mouth daily.  Marland Kitchen omeprazole (PRILOSEC) 20 MG capsule Take 20 mg by mouth 2 (two) times daily.   . [DISCONTINUED] sulfamethoxazole-trimethoprim (BACTRIM DS,SEPTRA DS) 800-160 MG per tablet Take by mouth.    Review of Systems Patient denies any headache, lightheadedness or dizziness.  No sinus or allergy symptoms.   No chest pain, tightness or palpatitions.  No increased shortness of breath, cough or congestion.  No  nausea or vomiting.  No acid reflux.  No swallowing changes reported.   No abdominal pain or cramping.  No bowel change, such as diarrhea, constipation, BRBPR or melana.  No urine change.  S/p IUD removal.  Doing well.  No period.  Finger swelling and ulceration as outlined.       Objective:   Physical Exam  Filed Vitals:   07/21/14 1407  BP: 116/70  Pulse: 69  Temp: 97.9 F (64.26 C)   54 year old female in no acute distress.   HEENT:  Nares- clear.  Oropharynx - without lesions. NECK:  Supple.  Nontender.  No audible bruit.  HEART:  Appears to be regular. LUNGS:  No crackles or wheezing audible.  Respirations even and unlabored.  RADIAL PULSE:  Equal bilaterally.    ABDOMEN:  Soft, nontender.  Bowel sounds present and normal.  No audible abdominal bruit.   EXTREMITIES:  No increased edema present.  DP pulses palpable and equal bilaterally.  Right third finger - swelling and draining.  No erythema extending up the finger.  Increased pain to palpation.                Assessment & Plan:  1. Gastroesophageal reflux disease with esophagitis Controlled.  On omeprazole.    2. Scleroderma Following with Dr Jefm Bryant.    3. Raynauds phenomenon On amlodipine and nitroglycerin cream.  Increased swelling and infection as outlined.  Recently drained.  Some better now.  Seeing Dr Jefm Bryant.  Just completed zpak.  bactroban as directed.  Follow closely.    4. History of colonic polyps Colonoscopy 02/15/12.    5. Finger ulcer, with unspecified severity See above.  bactroban as directed.  Draining.  Starting to improve per pt.  Has f/u with Dr Jefm Bryant next week.  Just completed zpak.  Follow closely.    6. ABNORMAL MAMMOGRAM.  Had a six month follow up mammo 08/14/12 - ok.  Had bilateral mammogram - 02/18/13 - Birads II.   Was scheduled for a f/u mammogram 02/27/14.  States had mammo and ok.  Need results.    7. VASCULAR.  Saw Dr Aleda Grana.  Doing well.  Continue support hose.    8. GYN.   Seeing Dr Marcelline Mates.  S/P IUD removal.     HEALTH MAINTENANCE.  Seeing gyn recently.  Mammogram as outlined.  Had colonoscopy 02/15/12.

## 2014-07-21 NOTE — Telephone Encounter (Signed)
Pt is coming tomorrow what labs and dx?  

## 2014-07-21 NOTE — Progress Notes (Signed)
Pre visit review using our clinic review tool, if applicable. No additional management support is needed unless otherwise documented below in the visit note. 

## 2014-07-21 NOTE — Telephone Encounter (Signed)
Order placed for labs.

## 2014-07-22 ENCOUNTER — Other Ambulatory Visit (INDEPENDENT_AMBULATORY_CARE_PROVIDER_SITE_OTHER): Payer: BC Managed Care – PPO

## 2014-07-22 DIAGNOSIS — E7800 Pure hypercholesterolemia, unspecified: Secondary | ICD-10-CM

## 2014-07-22 DIAGNOSIS — M349 Systemic sclerosis, unspecified: Secondary | ICD-10-CM

## 2014-07-22 LAB — COMPREHENSIVE METABOLIC PANEL
ALK PHOS: 60 U/L (ref 39–117)
ALT: 18 U/L (ref 0–35)
AST: 21 U/L (ref 0–37)
Albumin: 4.2 g/dL (ref 3.5–5.2)
BUN: 16 mg/dL (ref 6–23)
CALCIUM: 9.5 mg/dL (ref 8.4–10.5)
CO2: 28 meq/L (ref 19–32)
Chloride: 105 mEq/L (ref 96–112)
Creatinine, Ser: 0.8 mg/dL (ref 0.4–1.2)
GFR: 75.49 mL/min (ref >60.00)
GLUCOSE: 90 mg/dL (ref 70–99)
POTASSIUM: 4 meq/L (ref 3.5–5.1)
Sodium: 138 mEq/L (ref 135–145)
TOTAL PROTEIN: 7.3 g/dL (ref 6.0–8.3)
Total Bilirubin: 0.8 mg/dL (ref 0.2–1.2)

## 2014-07-22 LAB — LIPID PANEL
CHOLESTEROL: 185 mg/dL (ref 0–200)
HDL: 52.9 mg/dL (ref >39.00)
LDL Cholesterol: 105 mg/dL — ABNORMAL HIGH (ref 0–99)
NONHDL: 132.1
TRIGLYCERIDES: 137 mg/dL (ref 0.0–149.0)
Total CHOL/HDL Ratio: 3
VLDL: 27.4 mg/dL (ref 0.0–40.0)

## 2014-07-23 ENCOUNTER — Encounter: Payer: Self-pay | Admitting: Internal Medicine

## 2014-07-26 ENCOUNTER — Encounter: Payer: Self-pay | Admitting: Internal Medicine

## 2014-07-26 DIAGNOSIS — L98499 Non-pressure chronic ulcer of skin of other sites with unspecified severity: Secondary | ICD-10-CM

## 2015-01-13 ENCOUNTER — Encounter: Payer: Self-pay | Admitting: Obstetrics and Gynecology

## 2015-01-20 ENCOUNTER — Encounter: Payer: Self-pay | Admitting: Internal Medicine

## 2015-01-20 ENCOUNTER — Other Ambulatory Visit (HOSPITAL_COMMUNITY)
Admission: RE | Admit: 2015-01-20 | Discharge: 2015-01-20 | Disposition: A | Discharge status: Home / Self Care | Payer: BC Managed Care – PPO | Source: Ambulatory Visit | Attending: Internal Medicine | Admitting: Internal Medicine

## 2015-01-20 ENCOUNTER — Ambulatory Visit (INDEPENDENT_AMBULATORY_CARE_PROVIDER_SITE_OTHER): Payer: BC Managed Care – PPO | Admitting: Internal Medicine

## 2015-01-20 VITALS — BP 110/70 | HR 62 | Temp 98.1°F | Ht 64.25 in | Wt 173.4 lb

## 2015-01-20 DIAGNOSIS — Z1151 Encounter for screening for human papillomavirus (HPV): Secondary | ICD-10-CM | POA: Diagnosis present

## 2015-01-20 DIAGNOSIS — I7300 Raynaud's syndrome without gangrene: Secondary | ICD-10-CM

## 2015-01-20 DIAGNOSIS — Z01419 Encounter for gynecological examination (general) (routine) without abnormal findings: Secondary | ICD-10-CM | POA: Diagnosis present

## 2015-01-20 DIAGNOSIS — Z0000 Encounter for general adult medical examination without abnormal findings: Secondary | ICD-10-CM | POA: Diagnosis not present

## 2015-01-20 DIAGNOSIS — Z13220 Encounter for screening for lipoid disorders: Secondary | ICD-10-CM

## 2015-01-20 DIAGNOSIS — K2100 Gastro-esophageal reflux disease with esophagitis, without bleeding: Secondary | ICD-10-CM

## 2015-01-20 DIAGNOSIS — M5030 Other cervical disc degeneration, unspecified cervical region: Secondary | ICD-10-CM

## 2015-01-20 DIAGNOSIS — M349 Systemic sclerosis, unspecified: Secondary | ICD-10-CM

## 2015-01-20 DIAGNOSIS — E559 Vitamin D deficiency, unspecified: Secondary | ICD-10-CM

## 2015-01-20 DIAGNOSIS — Z1239 Encounter for other screening for malignant neoplasm of breast: Secondary | ICD-10-CM

## 2015-01-20 DIAGNOSIS — L98499 Non-pressure chronic ulcer of skin of other sites with unspecified severity: Secondary | ICD-10-CM

## 2015-01-20 DIAGNOSIS — Z86010 Personal history of colonic polyps: Secondary | ICD-10-CM

## 2015-01-20 NOTE — Progress Notes (Signed)
Patient ID: Denise Arnold, female   DOB: [birth year], 54 y.o.   MRN: [id number]   Subjective:    Patient ID: Denise Arnold, female    DOB: [birth year], 54 y.o.   MRN: [id number]  HPI  Patient here to follow up on these medical issues as well as for a complete physical exam.  Her finger has improved.  Seeing Dr Jefm Bryant for follow up of her raynaud's and her scleroderma.  No sob.  Due to see Dr Raul Del in 02/2015.  Staying active.  Is exercising.  No cardiac symptoms with increased activity or exertion.  Bowels stable.     Past Medical History  Diagnosis Date  . Scleroderma     positive FANA, positive SCL-70 abs, raynauds, mild pulmonary hypertension, normal DLCO  . GERD (gastroesophageal reflux disease)   . Tricuspid regurgitation   . History of endoscopy 04/05/2013    upper  . Menorrhagia   . Reynolds syndrome   . Vitamin D deficiency     Current Outpatient Prescriptions on File Prior to Visit  Medication Sig Dispense Refill  . amLODipine (NORVASC) 5 MG tablet Take 5 mg by mouth 2 (two) times daily.     . Calcium-Magnesium-Vitamin D (CALCIUM 500 PO) Take by mouth.    . cholecalciferol (VITAMIN D) 1000 UNITS tablet Take 1,000 Units by mouth daily.    . fish oil-omega-3 fatty acids 1000 MG capsule Take 1 g by mouth daily.    . Multiple Vitamin (MULTIVITAMIN) tablet Take 1 tablet by mouth daily.    Marland Kitchen omeprazole (PRILOSEC) 20 MG capsule Take 20 mg by mouth 2 (two) times daily.      No current facility-administered medications on file prior to visit.    Review of Systems  Constitutional: Negative for appetite change and unexpected weight change.  HENT: Negative for congestion and sinus pressure.   Eyes: Negative for pain and visual disturbance.  Respiratory: Negative for cough, chest tightness and shortness of breath.   Cardiovascular: Negative for chest pain, palpitations and leg swelling.  Gastrointestinal: Negative for nausea, vomiting, abdominal pain and diarrhea.  Genitourinary:  Negative for dysuria and difficulty urinating.  Musculoskeletal:       Followed by Dr Jefm Bryant for her scleroderma and raynaud's.  Finger better.    Skin: Negative for color change and rash.  Neurological: Negative for dizziness, light-headedness and headaches.  Hematological: Negative for adenopathy. Does not bruise/bleed easily.  Psychiatric/Behavioral: Negative for dysphoric mood and agitation.       Objective:    Physical Exam  Constitutional: She is oriented to person, place, and time. She appears well-developed and well-nourished.  HENT:  Nose: Nose normal.  Mouth/Throat: Oropharynx is clear and moist.  Eyes: Right eye exhibits no discharge. Left eye exhibits no discharge. No scleral icterus.  Neck: Neck supple. No thyromegaly present.  Cardiovascular: Normal rate and regular rhythm.   Pulmonary/Chest: Breath sounds normal. No accessory muscle usage. No tachypnea. No respiratory distress. She has no decreased breath sounds. She has no wheezes. She has no rhonchi. Right breast exhibits no inverted nipple, no mass, no nipple discharge and no tenderness (no axillary adenopathy). Left breast exhibits no inverted nipple, no mass, no nipple discharge and no tenderness (no axilarry adenopathy).  Abdominal: Soft. Bowel sounds are normal. There is no tenderness.  Genitourinary:  Normal external genitalia.  Vaginal vault without lesions.  Cervix identified.  Pap smear performed.  Could not appreciate any adnexal masses or tenderness.    Musculoskeletal: She exhibits  no edema or tenderness.  Lymphadenopathy:    She has no cervical adenopathy.  Neurological: She is alert and oriented to person, place, and time.  Skin: Skin is warm. No rash noted.  Psychiatric: She has a normal mood and affect. Her behavior is normal.    BP 110/70 mmHg  Pulse 62  Temp(Src) 98.1 F (36.7 C) (Oral)  Ht 5' 4.25" (1.632 m)  Wt 173 lb 6 oz (78.642 kg)  BMI 29.53 kg/m2  SpO2 98% Wt Readings from Last 3  Encounters:  01/20/15 173 lb 6 oz (78.642 kg)  07/21/14 171 lb 8 oz (77.792 kg)  01/15/14 176 lb (79.833 kg)     Lab Results  Component Value Date   WBC 5.3 01/21/2015   HGB 13.4 01/21/2015   HCT 39.9 01/21/2015   PLT 208.0 01/21/2015   GLUCOSE 91 01/21/2015   CHOL 177 01/21/2015   TRIG 136.0 01/21/2015   HDL 49.50 01/21/2015   LDLCALC 100* 01/21/2015   ALT 15 01/21/2015   AST 20 01/21/2015   NA 140 01/21/2015   K 4.3 01/21/2015   CL 105 01/21/2015   CREATININE 0.81 01/21/2015   BUN 12 01/21/2015   CO2 29 01/21/2015   TSH 1.79 01/21/2015       Assessment & Plan:   Problem List Items Addressed This Visit    Degenerative cervical disc    Saw Dr Vertell Limber.  No pain reported today.       Finger ulcer    Healing.  Improved.  Continue f/u with Dr Jefm Bryant.        GERD (gastroesophageal reflux disease)    EGD as outlined in overview.  Doing well.        Health care maintenance    Physical today 01/20/15.  PAP today.  Refer to GI as outlined.  Schedule mammogram.       History of colonic polyps    Colonoscopy 04/05/13.  Recommended f/u colonoscopy in 03/2018.  States she spoke to Dr Tiffany Kocher when her husband went in for his colonoscopy.  States was told to schedule earlier appt.  Referral palced.        Relevant Orders   Ambulatory referral to Gastroenterology   Cytology - PAP (Completed)   Raynauds phenomenon    Followed by Dr Jefm Bryant.  Finger improved.       Relevant Orders   Cytology - PAP (Completed)   Scleroderma - Primary    Followed by Dr Jefm Bryant.  Due to see Dr Raul Del next month.        Relevant Orders   CBC with Differential/Platelet (Completed)   TSH (Completed)   Basic metabolic panel (Completed)   Hepatic function panel (Completed)   Cytology - PAP (Completed)   Vitamin D deficiency    Follow level.       Relevant Orders   Vit D  25 hydroxy (rtn osteoporosis monitoring) (Completed)   Cytology - PAP (Completed)    Other Visit Diagnoses     Screening breast examination        Relevant Orders    MM DIGITAL SCREENING BILATERAL    Cytology - PAP (Completed)    Screening cholesterol level        Relevant Orders    Lipid panel (Completed)    Cytology - PAP (Completed)        Einar Pheasant, MD

## 2015-01-20 NOTE — Progress Notes (Signed)
Pre visit review using our clinic review tool, if applicable. No additional management support is needed unless otherwise documented below in the visit note. 

## 2015-01-21 ENCOUNTER — Other Ambulatory Visit (INDEPENDENT_AMBULATORY_CARE_PROVIDER_SITE_OTHER): Payer: BC Managed Care – PPO

## 2015-01-21 ENCOUNTER — Encounter: Payer: Self-pay | Admitting: Internal Medicine

## 2015-01-21 DIAGNOSIS — M349 Systemic sclerosis, unspecified: Secondary | ICD-10-CM

## 2015-01-21 DIAGNOSIS — Z13220 Encounter for screening for lipoid disorders: Secondary | ICD-10-CM | POA: Diagnosis not present

## 2015-01-21 DIAGNOSIS — E559 Vitamin D deficiency, unspecified: Secondary | ICD-10-CM | POA: Diagnosis not present

## 2015-01-21 LAB — BASIC METABOLIC PANEL
BUN: 12 mg/dL (ref 6–23)
CALCIUM: 9.6 mg/dL (ref 8.4–10.5)
CO2: 29 mEq/L (ref 19–32)
Chloride: 105 mEq/L (ref 96–112)
Creatinine, Ser: 0.81 mg/dL (ref 0.40–1.20)
GFR: 78.57 mL/min (ref >60.00)
Glucose, Bld: 91 mg/dL (ref 70–99)
POTASSIUM: 4.3 meq/L (ref 3.5–5.1)
SODIUM: 140 meq/L (ref 135–145)

## 2015-01-21 LAB — HEPATIC FUNCTION PANEL
ALBUMIN: 3.9 g/dL (ref 3.5–5.2)
ALT: 15 U/L (ref 0–35)
AST: 20 U/L (ref 0–37)
Alkaline Phosphatase: 61 U/L (ref 39–117)
Bilirubin, Direct: 0.1 mg/dL (ref 0.0–0.3)
Total Bilirubin: 0.5 mg/dL (ref 0.2–1.2)
Total Protein: 7 g/dL (ref 6.0–8.3)

## 2015-01-21 LAB — LIPID PANEL
CHOLESTEROL: 177 mg/dL (ref 0–200)
HDL: 49.5 mg/dL (ref >39.00)
LDL Cholesterol: 100 mg/dL — ABNORMAL HIGH (ref 0–99)
NonHDL: 127.5
TRIGLYCERIDES: 136 mg/dL (ref 0.0–149.0)
Total CHOL/HDL Ratio: 4
VLDL: 27.2 mg/dL (ref 0.0–40.0)

## 2015-01-21 LAB — TSH: TSH: 1.79 u[IU]/mL (ref 0.35–4.50)

## 2015-01-21 LAB — VITAMIN D 25 HYDROXY (VIT D DEFICIENCY, FRACTURES): VITD: 32.32 ng/mL (ref 30.00–100.00)

## 2015-01-21 LAB — CYTOLOGY - PAP

## 2015-01-22 ENCOUNTER — Encounter: Payer: Self-pay | Admitting: *Deleted

## 2015-01-22 ENCOUNTER — Encounter: Payer: Self-pay | Admitting: Internal Medicine

## 2015-01-22 LAB — CBC WITH DIFFERENTIAL/PLATELET
Basophils Absolute: 0 10*3/uL (ref 0.0–0.1)
Basophils Relative: 0.2 % (ref 0.0–3.0)
EOS PCT: 2 % (ref 0.0–5.0)
Eosinophils Absolute: 0.1 10*3/uL (ref 0.0–0.7)
HEMATOCRIT: 39.9 % (ref 36.0–46.0)
Hemoglobin: 13.4 g/dL (ref 12.0–15.0)
LYMPHS ABS: 1.6 10*3/uL (ref 0.7–4.0)
Lymphocytes Relative: 30.4 % (ref 12.0–46.0)
MCHC: 33.6 g/dL (ref 30.0–36.0)
MCV: 90.5 fl (ref 78.0–100.0)
Monocytes Absolute: 0.3 10*3/uL (ref 0.1–1.0)
Monocytes Relative: 6 % (ref 3.0–12.0)
Neutro Abs: 3.2 10*3/uL (ref 1.4–7.7)
Neutrophils Relative %: 61.4 % (ref 43.0–77.0)
Platelets: 208 10*3/uL (ref 150.0–400.0)
RBC: 4.41 Mil/uL (ref 3.87–5.11)
RDW: 14.1 % (ref 11.5–15.5)
WBC: 5.3 10*3/uL (ref 4.0–10.5)

## 2015-01-25 ENCOUNTER — Encounter: Payer: Self-pay | Admitting: Internal Medicine

## 2015-01-25 DIAGNOSIS — Z0000 Encounter for general adult medical examination without abnormal findings: Secondary | ICD-10-CM | POA: Insufficient documentation

## 2015-01-25 NOTE — Assessment & Plan Note (Signed)
Follow level.

## 2015-01-25 NOTE — Assessment & Plan Note (Signed)
Healing.  Improved.  Continue f/u with Dr Jefm Bryant.

## 2015-01-25 NOTE — Assessment & Plan Note (Signed)
Followed by Dr Jefm Bryant.  Due to see Dr Raul Del next month.

## 2015-01-25 NOTE — Assessment & Plan Note (Signed)
Physical today 01/20/15.  PAP today.  Refer to GI as outlined.  Schedule mammogram.

## 2015-01-25 NOTE — Assessment & Plan Note (Addendum)
Colonoscopy 04/05/13.  Recommended f/u colonoscopy in 03/2018.  States she spoke to Dr Tiffany Kocher when her husband went in for his colonoscopy.  States was told to schedule earlier appt.  Referral palced.

## 2015-01-25 NOTE — Assessment & Plan Note (Signed)
EGD as outlined in overview.  Doing well.

## 2015-01-25 NOTE — Assessment & Plan Note (Signed)
Followed by Dr Jefm Bryant.  Finger improved.

## 2015-01-25 NOTE — Assessment & Plan Note (Signed)
Saw Dr Vertell Limber.  No pain reported today.

## 2015-01-29 ENCOUNTER — Ambulatory Visit (INDEPENDENT_AMBULATORY_CARE_PROVIDER_SITE_OTHER): Payer: BC Managed Care – PPO | Admitting: General Surgery

## 2015-01-29 ENCOUNTER — Encounter: Payer: Self-pay | Admitting: General Surgery

## 2015-01-29 VITALS — BP 120/78 | HR 80 | Resp 12 | Ht 64.0 in | Wt 172.0 lb

## 2015-01-29 DIAGNOSIS — I868 Varicose veins of other specified sites: Secondary | ICD-10-CM | POA: Diagnosis not present

## 2015-01-29 DIAGNOSIS — I8390 Asymptomatic varicose veins of unspecified lower extremity: Secondary | ICD-10-CM

## 2015-01-29 NOTE — Progress Notes (Signed)
Patient ID: Denise Arnold, female   DOB: 1960/12/11, 54 y.o.   MRN: [id number]  Chief Complaint  Patient presents with  . Other    Varicose veins    HPI Denise Arnold is a 54 y.o. female here today for a evaluation of Varicose veins. Patient states she had a bilateral leg ultrasound was done in 2015. She states both legs swell but no pain. She states she has a sore spot on her right leg.  HPI  Past Medical History  Diagnosis Date  . Scleroderma     positive FANA, positive SCL-70 abs, raynauds, mild pulmonary hypertension, normal DLCO  . GERD (gastroesophageal reflux disease)   . Tricuspid regurgitation   . History of endoscopy 04/05/2013    upper  . Menorrhagia   . Reynolds syndrome   . Vitamin D deficiency     Past Surgical History  Procedure Laterality Date  . Dilation and curettage of uterus  1992  . Dilation and curettage of uterus  1993  . Cesarean section  1994  . Dilation and curettage of uterus  2011  . Vein surgery      Family History  Problem Relation Age of Onset  . Heart disease Father     myocardial infarction  . Arthritis/Rheumatoid Father   . Hypertension Father   . Hyperlipidemia Mother   . Breast cancer Maternal Grandmother   . Colon cancer Neg Hx     Social History History  Substance Use Topics  . Smoking status: Never Smoker   . Smokeless tobacco: Never Used  . Alcohol Use: No    Allergies  Allergen Reactions  . Sulfa Antibiotics Nausea And Vomiting  . Penicillins Rash    Current Outpatient Prescriptions  Medication Sig Dispense Refill  . amLODipine (NORVASC) 5 MG tablet Take 5 mg by mouth 2 (two) times daily.     . Calcium-Magnesium-Vitamin D (CALCIUM 500 PO) Take by mouth.    . cholecalciferol (VITAMIN D) 1000 UNITS tablet Take 1,000 Units by mouth daily.    . fish oil-omega-3 fatty acids 1000 MG capsule Take 1 g by mouth daily.    Marland Kitchen glucosamine-chondroitin 500-400 MG tablet Take 1 tablet by mouth daily.    . Multiple Vitamin  (MULTIVITAMIN) tablet Take 1 tablet by mouth daily.    Marland Kitchen omeprazole (PRILOSEC) 20 MG capsule Take 20 mg by mouth 2 (two) times daily.      No current facility-administered medications for this visit.    Review of Systems Review of Systems  Constitutional: Negative.   Respiratory: Negative.   Cardiovascular: Positive for leg swelling. Negative for chest pain and palpitations.    Blood pressure 120/78, pulse 80, resp. rate 12, height 5\' 4"  (1.626 m), weight 172 lb (78.019 kg).  Physical Exam Physical Exam  Constitutional: She is oriented to person, place, and time. She appears well-developed and well-nourished.  Eyes: Conjunctivae are normal. No scleral icterus.  Neck: Neck supple.  Cardiovascular: Normal rate, regular rhythm and normal heart sounds.   Pulses:      Dorsalis pedis pulses are 2+ on the right side, and 2+ on the left side.       Posterior tibial pulses are 2+ on the right side, and 2+ on the left side.  Very mild edema bilateral legs.   Abdominal: Soft. Normal appearance and bowel sounds are normal. There is no hepatomegaly. There is no tenderness.  Lymphadenopathy:    She has no cervical adenopathy.  Neurological: She is alert  and oriented to person, place, and time.  Skin: Skin is warm and dry.  There sore spot pt points to in right leg is in mid leg, no skin induration or subcutaneous mass. There is minimally prominent vein at this site. Very few residual VV noted on both legs.  Data Reviewed Notes from [place] has had bilateral GSV ablations 3-4 yrs ago.   Assessment    Pt advised-no findings of concern in the right leg where she fels a sore spot. Has features of venous insufficiency- not requiring further invesetigation at present    Plan    Patient to return if she develops any stasis symptoms-these were explained  to her .  Patient to use her compassion hoses daily.     PCP:  Derry Skill 01/30/2015, 5:05  PM

## 2015-01-29 NOTE — Patient Instructions (Signed)
Patient to return as needed.  Patient to worn her compassion hoses daily.

## 2015-01-30 ENCOUNTER — Encounter: Payer: Self-pay | Admitting: General Surgery

## 2015-02-02 ENCOUNTER — Encounter: Payer: Self-pay | Admitting: General Surgery

## 2015-03-02 ENCOUNTER — Ambulatory Visit
Admission: RE | Admit: 2015-03-02 | Discharge: 2015-03-02 | Disposition: A | Discharge status: Home / Self Care | Payer: BC Managed Care – PPO | Source: Ambulatory Visit | Attending: Internal Medicine | Admitting: Internal Medicine

## 2015-03-02 DIAGNOSIS — Z1239 Encounter for other screening for malignant neoplasm of breast: Secondary | ICD-10-CM

## 2015-03-02 DIAGNOSIS — Z1231 Encounter for screening mammogram for malignant neoplasm of breast: Secondary | ICD-10-CM | POA: Diagnosis present

## 2015-05-28 ENCOUNTER — Encounter: Payer: Self-pay | Admitting: *Deleted

## 2015-05-29 ENCOUNTER — Encounter: Payer: Self-pay | Admitting: Anesthesiology

## 2015-05-29 ENCOUNTER — Ambulatory Visit: Payer: BC Managed Care – PPO | Admitting: Anesthesiology

## 2015-05-29 ENCOUNTER — Encounter
Admission: RE | Disposition: A | Discharge status: Home / Self Care | Payer: Self-pay | Source: Ambulatory Visit | Attending: Unknown Physician Specialty

## 2015-05-29 ENCOUNTER — Ambulatory Visit
Admission: RE | Admit: 2015-05-29 | Discharge: 2015-05-29 | Disposition: A | Discharge status: Home / Self Care | Payer: BC Managed Care – PPO | Source: Ambulatory Visit | Attending: Unknown Physician Specialty | Admitting: Unknown Physician Specialty

## 2015-05-29 DIAGNOSIS — K5730 Diverticulosis of large intestine without perforation or abscess without bleeding: Secondary | ICD-10-CM | POA: Diagnosis not present

## 2015-05-29 DIAGNOSIS — N920 Excessive and frequent menstruation with regular cycle: Secondary | ICD-10-CM | POA: Insufficient documentation

## 2015-05-29 DIAGNOSIS — Z86010 Personal history of colonic polyps: Secondary | ICD-10-CM | POA: Insufficient documentation

## 2015-05-29 DIAGNOSIS — Z803 Family history of malignant neoplasm of breast: Secondary | ICD-10-CM | POA: Diagnosis not present

## 2015-05-29 DIAGNOSIS — E559 Vitamin D deficiency, unspecified: Secondary | ICD-10-CM | POA: Diagnosis not present

## 2015-05-29 DIAGNOSIS — Z9889 Other specified postprocedural states: Secondary | ICD-10-CM | POA: Insufficient documentation

## 2015-05-29 DIAGNOSIS — D125 Benign neoplasm of sigmoid colon: Secondary | ICD-10-CM | POA: Insufficient documentation

## 2015-05-29 DIAGNOSIS — Z8261 Family history of arthritis: Secondary | ICD-10-CM | POA: Insufficient documentation

## 2015-05-29 DIAGNOSIS — Z8249 Family history of ischemic heart disease and other diseases of the circulatory system: Secondary | ICD-10-CM | POA: Diagnosis not present

## 2015-05-29 DIAGNOSIS — K219 Gastro-esophageal reflux disease without esophagitis: Secondary | ICD-10-CM | POA: Diagnosis not present

## 2015-05-29 DIAGNOSIS — I272 Other secondary pulmonary hypertension: Secondary | ICD-10-CM | POA: Insufficient documentation

## 2015-05-29 DIAGNOSIS — K640 First degree hemorrhoids: Secondary | ICD-10-CM | POA: Insufficient documentation

## 2015-05-29 DIAGNOSIS — M1990 Unspecified osteoarthritis, unspecified site: Secondary | ICD-10-CM | POA: Diagnosis not present

## 2015-05-29 DIAGNOSIS — I071 Rheumatic tricuspid insufficiency: Secondary | ICD-10-CM | POA: Diagnosis not present

## 2015-05-29 DIAGNOSIS — Z880 Allergy status to penicillin: Secondary | ICD-10-CM | POA: Insufficient documentation

## 2015-05-29 DIAGNOSIS — I7300 Raynaud's syndrome without gangrene: Secondary | ICD-10-CM | POA: Insufficient documentation

## 2015-05-29 DIAGNOSIS — M349 Systemic sclerosis, unspecified: Secondary | ICD-10-CM | POA: Insufficient documentation

## 2015-05-29 DIAGNOSIS — Z882 Allergy status to sulfonamides status: Secondary | ICD-10-CM | POA: Diagnosis not present

## 2015-05-29 DIAGNOSIS — Z79899 Other long term (current) drug therapy: Secondary | ICD-10-CM | POA: Insufficient documentation

## 2015-05-29 HISTORY — PX: COLONOSCOPY WITH PROPOFOL: SHX5780

## 2015-05-29 HISTORY — DX: Unspecified osteoarthritis, unspecified site: M19.90

## 2015-05-29 SURGERY — COLONOSCOPY WITH PROPOFOL
Anesthesia: General

## 2015-05-29 MED ORDER — MIDAZOLAM HCL 5 MG/5ML IJ SOLN
INTRAMUSCULAR | Status: DC | PRN
  Administered 2015-05-29: 1 mg via INTRAVENOUS

## 2015-05-29 MED ORDER — SODIUM CHLORIDE 0.9 % IV SOLN: INTRAVENOUS | Status: DC

## 2015-05-29 MED ORDER — PROPOFOL 10 MG/ML IV BOLUS
INTRAVENOUS | Status: DC | PRN
  Administered 2015-05-29: 55 mg via INTRAVENOUS

## 2015-05-29 MED ORDER — ONDANSETRON HCL 4 MG/2ML IJ SOLN: 4.0000 mg | Freq: Once | INTRAMUSCULAR | Status: DC | PRN

## 2015-05-29 MED ORDER — SODIUM CHLORIDE 0.9 % IV SOLN
INTRAVENOUS | Status: DC
  Administered 2015-05-29: 14:00:00 via INTRAVENOUS

## 2015-05-29 MED ORDER — PHENYLEPHRINE HCL 10 MG/ML IJ SOLN
INTRAMUSCULAR | Status: DC | PRN
Start: 2015-05-29 — End: 2015-05-29
  Administered 2015-05-29: 100 ug via INTRAVENOUS
  Administered 2015-05-29: 75 ug via INTRAVENOUS

## 2015-05-29 MED ORDER — PROPOFOL 500 MG/50ML IV EMUL
INTRAVENOUS | Status: DC | PRN
  Administered 2015-05-29: 140 ug/kg/min via INTRAVENOUS

## 2015-05-29 MED ORDER — FENTANYL CITRATE (PF) 100 MCG/2ML IJ SOLN
25.0000 ug | INTRAMUSCULAR | Status: DC | PRN
  Administered 2015-05-29: 50 ug via INTRAVENOUS

## 2015-05-29 NOTE — Anesthesia Preprocedure Evaluation (Signed)
Anesthesia Evaluation  Patient identified by MRN, date of birth, ID band Patient awake    Reviewed: Allergy & Precautions, NPO status , Patient's Chart, lab work & pertinent test results  Airway Mallampati: II  TM Distance: <3 FB Neck ROM: Full    Dental no notable dental hx. (+) Caps   Pulmonary neg pulmonary ROS,    Pulmonary exam normal breath sounds clear to auscultation       Cardiovascular hypertension, Pt. on medications Normal cardiovascular exam+ Valvular Problems/Murmurs MR   raynauds   Neuro/Psych negative neurological ROS  negative psych ROS   GI/Hepatic Neg liver ROS, GERD  Medicated and Controlled,  Endo/Other  negative endocrine ROS  Renal/GU negative Renal ROS     Musculoskeletal  (+) Arthritis , Osteoarthritis,    Abdominal Normal abdominal exam  (+)   Peds negative pediatric ROS (+)  Hematology negative hematology ROS (+)   Anesthesia Other Findings Scleroderma...degenerative disc disease  Reproductive/Obstetrics                             Anesthesia Physical Anesthesia Plan  ASA: III  Anesthesia Plan: General   Post-op Pain Management:    Induction: Intravenous  Airway Management Planned: Nasal Cannula  Additional Equipment:   Intra-op Plan:   Post-operative Plan:   Informed Consent: I have reviewed the patients History and Physical, chart, labs and discussed the procedure including the risks, benefits and alternatives for the proposed anesthesia with the patient or authorized representative who has indicated his/her understanding and acceptance.   Dental advisory given  Plan Discussed with: CRNA and Surgeon  Anesthesia Plan Comments:         Anesthesia Quick Evaluation

## 2015-05-29 NOTE — Transfer of Care (Signed)
Immediate Anesthesia Transfer of Care Note  Patient: Denise Arnold  Procedure(s) Performed: Procedure(s): COLONOSCOPY WITH PROPOFOL (N/A)  Patient Location: PACU and Endoscopy Unit  Anesthesia Type:General  Level of Consciousness: awake, alert  and oriented  Airway & Oxygen Therapy: Patient Spontanous Breathing and Patient connected to nasal cannula oxygen  Post-op Assessment: Report given to RN, Post -op Vital signs reviewed and stable, Patient moving all extremities and Patient moving all extremities X 4  Post vital signs: Reviewed and stable  Last Vitals:  Filed Vitals:   05/29/15 1420  BP:   Pulse:   Temp: 36 C  Resp:     Complications: No apparent anesthesia complications

## 2015-05-29 NOTE — H&P (Signed)
Primary Care Physician:  Einar Pheasant, MD Primary Gastroenterologist:  Dr. Vira Agar  Pre-Procedure History & Physical: HPI:  Denise Arnold is a 54 y.o. female is here for an colonoscopy.   Past Medical History  Diagnosis Date  . Scleroderma (HCC)     positive FANA, positive SCL-70 abs, raynauds, mild pulmonary hypertension, normal DLCO  . GERD (gastroesophageal reflux disease)   . Tricuspid regurgitation   . History of endoscopy 04/05/2013    upper  . Menorrhagia   . Reynolds syndrome ([place])   . Vitamin D deficiency   . Arthritis     Past Surgical History  Procedure Laterality Date  . Dilation and curettage of uterus  1992  . Dilation and curettage of uterus  1993  . Cesarean section  1994  . Dilation and curettage of uterus  2011  . Vein surgery      Prior to Admission medications   Medication Sig Start Date End Date Taking? Authorizing Provider  amLODipine (NORVASC) 5 MG tablet Take 5 mg by mouth 2 (two) times daily.    Yes Historical Provider, MD  Calcium-Magnesium-Vitamin D (CALCIUM 500 PO) Take by mouth.   Yes Historical Provider, MD  cholecalciferol (VITAMIN D) 1000 UNITS tablet Take 1,000 Units by mouth daily.   Yes Historical Provider, MD  fish oil-omega-3 fatty acids 1000 MG capsule Take 1 g by mouth daily.   Yes Historical Provider, MD  Multiple Vitamin (MULTIVITAMIN) tablet Take 1 tablet by mouth daily.   Yes Historical Provider, MD  nitroGLYCERIN (NITROGLYN) 2 % ointment Apply 0.5 inches topically 3 (three) times daily as needed for chest pain.   Yes Historical Provider, MD  omeprazole (PRILOSEC) 20 MG capsule Take 20 mg by mouth 2 (two) times daily.    Yes Historical Provider, MD  glucosamine-chondroitin 500-400 MG tablet Take 1 tablet by mouth daily.    Historical Provider, MD    Allergies as of 04/13/2015 - Review Complete 02/02/2015  Allergen Reaction Noted  . Sulfa antibiotics Nausea And Vomiting 07/21/2014  . Penicillins Rash 07/17/2012    Family  History  Problem Relation Age of Onset  . Heart disease Father     myocardial infarction  . Arthritis/Rheumatoid Father   . Hypertension Father   . Hyperlipidemia Mother   . Breast cancer Maternal Grandmother   . Colon cancer Neg Hx     Social History   Social History  . Marital Status: Married    Spouse Name: N/A  . Number of Children: 1  . Years of Education: N/A   Occupational History  . Not on file.   Social History Main Topics  . Smoking status: Never Smoker   . Smokeless tobacco: Never Used  . Alcohol Use: No  . Drug Use: No  . Sexual Activity: Not on file   Other Topics Concern  . Not on file   Social History Narrative    Review of Systems: See HPI, otherwise negative ROS  Physical Exam: BP 125/75 mmHg  Pulse 56  Temp(Src) 98.5 F (36.9 C) (Tympanic)  Resp 19  Ht 5\' 4"  (1.626 m)  Wt 76.204 kg (168 lb)  BMI 28.82 kg/m2  SpO2 100%  LMP 02/14/2010 General:   Alert,  pleasant and cooperative in NAD Head:  Normocephalic and atraumatic. Neck:  Supple; no masses or thyromegaly. Lungs:  Clear throughout to auscultation.    Heart:  Regular rate and rhythm. Abdomen:  Soft, nontender and nondistended. Normal bowel sounds, without guarding, and without  rebound.   Neurologic:  Alert and  oriented x4;  grossly normal neurologically.  Impression/Plan: Denise Arnold is here for an colonoscopy to be performed for Denise Arnold colon polyps  Risks, benefits, limitations, and alternatives regarding  colonoscopy have been reviewed with the patient.  Questions have been answered.  All parties agreeable.   Gaylyn Cheers, MD  05/29/2015, 1:49 PM

## 2015-05-29 NOTE — Op Note (Signed)
[facility] Gastroenterology Patient Name: Denise Arnold Procedure Date: 05/29/2015 1:55 PM MRN: [id number] Account #: [id number] Date of Birth: 1961/05/12 Admit Type: Outpatient Age: 54 Room: [facility] ENDO ROOM 1 Gender: Female Note Status: Finalized Procedure:         Colonoscopy Indications:       High risk colon cancer surveillance: Personal history of                     colonic polyps Providers:         Manya Silvas, MD Medicines:         Propofol per Anesthesia Complications:     No immediate complications. Procedure:         Pre-Anesthesia Assessment:                    - After reviewing the risks and benefits, the patient was                     deemed in satisfactory condition to undergo the procedure.                    After obtaining informed consent, the colonoscope was                     passed under direct vision. Throughout the procedure, the                     patient's blood pressure, pulse, and oxygen saturations                     were monitored continuously. The Colonoscope was                     introduced through the anus and advanced to the the cecum,                     identified by appendiceal orifice and ileocecal valve. The                     colonoscopy was performed without difficulty. The patient                     tolerated the procedure well. The quality of the bowel                     preparation was excellent. Findings:      A diminutive polyp was found in the sigmoid colon. The polyp was       sessile. The polyp was removed with a cold snare. Resection was       complete, but the polyp tissue was not retrieved.      A few small-mouthed diverticula were found in the sigmoid colon.      Internal hemorrhoids were found during endoscopy. The hemorrhoids were       small and Grade I (internal hemorrhoids that do not prolapse).      The exam was otherwise without abnormality. Impression:        - One diminutive polyp in the  sigmoid colon. Complete                     resection. Polyp tissue not retrieved.                    - Diverticulosis in  the sigmoid colon.                    - Internal hemorrhoids.                    - The examination was otherwise normal. Recommendation:    - The findings and recommendations were discussed with the                     patient. Manya Silvas, MD 05/29/2015 2:17:56 PM This report has been signed electronically. Number of Addenda: 0 Note Initiated On: 05/29/2015 1:55 PM Scope Withdrawal Time: 0 hours 10 minutes 33 seconds  Total Procedure Duration: 0 hours 16 minutes 9 seconds       [facility]

## 2015-05-30 NOTE — Anesthesia Postprocedure Evaluation (Signed)
  Anesthesia Post-op Note  Patient: Denise Arnold  Procedure(s) Performed: Procedure(s): COLONOSCOPY WITH PROPOFOL (N/A)  Anesthesia type:General  Patient location: PACU  Post pain: Pain level controlled  Post assessment: Post-op Vital signs reviewed, Patient's Cardiovascular Status Stable, Respiratory Function Stable, Patent Airway and No signs of Nausea or vomiting  Post vital signs: Reviewed and stable  Last Vitals:  Filed Vitals:   05/29/15 1510  BP:   Pulse:   Temp:   Resp: 14    Level of consciousness: awake, alert  and patient cooperative  Complications: No apparent anesthesia complications

## 2015-06-03 ENCOUNTER — Encounter: Payer: Self-pay | Admitting: Unknown Physician Specialty

## 2015-07-23 ENCOUNTER — Encounter: Payer: Self-pay | Admitting: Internal Medicine

## 2015-07-23 ENCOUNTER — Ambulatory Visit (INDEPENDENT_AMBULATORY_CARE_PROVIDER_SITE_OTHER): Payer: BC Managed Care – PPO | Admitting: Internal Medicine

## 2015-07-23 VITALS — BP 100/70 | HR 65 | Temp 97.7°F | Resp 18 | Ht 64.0 in | Wt 169.0 lb

## 2015-07-23 DIAGNOSIS — E559 Vitamin D deficiency, unspecified: Secondary | ICD-10-CM | POA: Diagnosis not present

## 2015-07-23 DIAGNOSIS — M349 Systemic sclerosis, unspecified: Secondary | ICD-10-CM

## 2015-07-23 DIAGNOSIS — L98499 Non-pressure chronic ulcer of skin of other sites with unspecified severity: Secondary | ICD-10-CM

## 2015-07-23 DIAGNOSIS — Z860100 Personal history of colon polyps, unspecified: Secondary | ICD-10-CM

## 2015-07-23 DIAGNOSIS — K2100 Gastro-esophageal reflux disease with esophagitis, without bleeding: Secondary | ICD-10-CM

## 2015-07-23 DIAGNOSIS — I7300 Raynaud's syndrome without gangrene: Secondary | ICD-10-CM

## 2015-07-23 DIAGNOSIS — Z13220 Encounter for screening for lipoid disorders: Secondary | ICD-10-CM | POA: Diagnosis not present

## 2015-07-23 NOTE — Progress Notes (Signed)
Pre-visit discussion using our clinic review tool. No additional management support is needed unless otherwise documented below in the visit note.  

## 2015-07-23 NOTE — Progress Notes (Signed)
Patient ID: Denise Arnold, female   DOB: October 25, 1960, 55 y.o.   MRN: [id number]   Subjective:    Patient ID: Denise Arnold, female    DOB: 11-Jan-1961, 55 y.o.   MRN: [id number]  HPI  Patient with past history of scleroderma, GERD, Reynauds syndrome and arthritis.  She comes in today to follow up on these issues.  She is doing well.  Exercising.  Stays active.  No cardiac symptoms with increased activity or exertion.  No sob.  No acid reflux.  No abdominal pain or cramping.  Bowels stable.  Finger ulceration better.   Had colonoscopy.  Exercises.     Past Medical History  Diagnosis Date  . Scleroderma (HCC)     positive FANA, positive SCL-70 abs, raynauds, mild pulmonary hypertension, normal DLCO  . GERD (gastroesophageal reflux disease)   . Tricuspid regurgitation   . History of endoscopy 04/05/2013    upper  . Menorrhagia   . Reynolds syndrome ([place])   . Vitamin D deficiency   . Arthritis    Past Surgical History  Procedure Laterality Date  . Dilation and curettage of uterus  1992  . Dilation and curettage of uterus  1993  . Cesarean section  1994  . Dilation and curettage of uterus  2011  . Vein surgery    . Colonoscopy with propofol N/A 05/29/2015    Procedure: COLONOSCOPY WITH PROPOFOL;  Surgeon: Manya Silvas, MD;  Location: [facility] ENDOSCOPY;  Service: Endoscopy;  Laterality: N/A;   Family History  Problem Relation Age of Onset  . Heart disease Father     myocardial infarction  . Arthritis/Rheumatoid Father   . Hypertension Father   . Hyperlipidemia Mother   . Breast cancer Maternal Grandmother   . Colon cancer Neg Hx    Social History   Social History  . Marital Status: Married    Spouse Name: N/A  . Number of Children: 1  . Years of Education: N/A   Social History Main Topics  . Smoking status: Never Smoker   . Smokeless tobacco: Never Used  . Alcohol Use: No  . Drug Use: No  . Sexual Activity: Not Asked   Other Topics Concern  . None   Social History  Narrative    Outpatient Encounter Prescriptions as of 07/23/2015  Medication Sig  . amLODipine (NORVASC) 5 MG tablet Take 5 mg by mouth daily.   . Calcium-Magnesium-Vitamin D (CALCIUM 500 PO) Take by mouth.  . cholecalciferol (VITAMIN D) 1000 UNITS tablet Take 1,000 Units by mouth daily.  . fish oil-omega-3 fatty acids 1000 MG capsule Take 1 g by mouth daily.  Marland Kitchen glucosamine-chondroitin 500-400 MG tablet Take 1 tablet by mouth daily.  . Multiple Vitamin (MULTIVITAMIN) tablet Take 1 tablet by mouth daily.  . nitroGLYCERIN (NITROGLYN) 2 % ointment Apply 0.5 inches topically 3 (three) times daily as needed.   Marland Kitchen omeprazole (PRILOSEC) 20 MG capsule Take 20 mg by mouth 2 (two) times daily.   . TURMERIC PO Take by mouth.   No facility-administered encounter medications on file as of 07/23/2015.    Review of Systems  Constitutional: Negative for appetite change and unexpected weight change.  HENT: Negative for congestion and sinus pressure.   Respiratory: Negative for cough, chest tightness and shortness of breath.   Cardiovascular: Negative for chest pain, palpitations and leg swelling.  Gastrointestinal: Negative for nausea, vomiting, abdominal pain and diarrhea.  Genitourinary: Negative for dysuria and difficulty urinating.  Musculoskeletal: Negative for myalgias  and back pain.  Skin: Negative for color change and rash.       Finger ulceration better.   Neurological: Negative for dizziness, light-headedness and headaches.  Psychiatric/Behavioral: Negative for dysphoric mood and agitation.       Objective:    Physical Exam  Constitutional: She appears well-developed and well-nourished. No distress.  HENT:  Nose: Nose normal.  Mouth/Throat: Oropharynx is clear and moist.  Eyes: Conjunctivae are normal. Right eye exhibits no discharge. Left eye exhibits no discharge.  Neck: Neck supple. No thyromegaly present.  Cardiovascular: Normal rate and regular rhythm.   Pulmonary/Chest: Breath  sounds normal. No respiratory distress. She has no wheezes.  Abdominal: Soft. Bowel sounds are normal. There is no tenderness.  Musculoskeletal: She exhibits no edema or tenderness.  Lymphadenopathy:    She has no cervical adenopathy.  Skin: No rash noted. No erythema.  Psychiatric: She has a normal mood and affect. Her behavior is normal.    BP 100/70 mmHg  Pulse 65  Temp(Src) 97.7 F (36.5 C) (Oral)  Resp 18  Ht 5\' 4"  (1.626 m)  Wt 169 lb (76.658 kg)  BMI 28.99 kg/m2  SpO2 98%  LMP 02/14/2010 Wt Readings from Last 3 Encounters:  07/23/15 169 lb (76.658 kg)  05/29/15 168 lb (76.204 kg)  01/29/15 172 lb (78.019 kg)     Lab Results  Component Value Date   WBC 5.3 01/21/2015   HGB 13.4 01/21/2015   HCT 39.9 01/21/2015   PLT 208.0 01/21/2015   GLUCOSE 91 01/21/2015   CHOL 177 01/21/2015   TRIG 136.0 01/21/2015   HDL 49.50 01/21/2015   LDLCALC 100* 01/21/2015   ALT 15 01/21/2015   AST 20 01/21/2015   NA 140 01/21/2015   K 4.3 01/21/2015   CL 105 01/21/2015   CREATININE 0.81 01/21/2015   BUN 12 01/21/2015   CO2 29 01/21/2015   TSH 1.79 01/21/2015       Assessment & Plan:   Problem List Items Addressed This Visit    Finger ulcer ([place])    Better.  Follow.       GERD (gastroesophageal reflux disease)    EGD 03/2013.  On omeprazole.  Upper symptoms controlled.        History of colonic polyps    Colonoscopy 03/2013.  Recommended f/u colonoscopy 03/2018      Raynauds phenomenon    Followed by Dr Jefm Bryant.  On amlodipine.        Scleroderma ([place]) - Primary    Followed by Dr Jefm Bryant.  Stable.        Relevant Orders   CBC with Differential/Platelet   Hepatic function panel   Basic metabolic panel   Vitamin D deficiency    Continue vitamin D supplements.  Follow.        Other Visit Diagnoses    Screening cholesterol level        Relevant Orders    Lipid panel        Einar Pheasant, MD

## 2015-07-26 ENCOUNTER — Encounter: Payer: Self-pay | Admitting: Internal Medicine

## 2015-07-26 NOTE — Assessment & Plan Note (Signed)
Better.  Follow.  

## 2015-07-26 NOTE — Assessment & Plan Note (Signed)
Colonoscopy 03/2013.  Recommended f/u colonoscopy 03/2018

## 2015-07-26 NOTE — Assessment & Plan Note (Signed)
Followed by Dr Jefm Bryant.  On amlodipine.

## 2015-07-26 NOTE — Assessment & Plan Note (Signed)
EGD 03/2013.  On omeprazole.  Upper symptoms controlled.

## 2015-07-26 NOTE — Assessment & Plan Note (Signed)
Followed by Dr Kernodle.  Stable.  

## 2015-07-26 NOTE — Assessment & Plan Note (Signed)
Continue vitamin D supplements.  Follow.   

## 2015-07-29 ENCOUNTER — Other Ambulatory Visit (INDEPENDENT_AMBULATORY_CARE_PROVIDER_SITE_OTHER): Payer: BC Managed Care – PPO

## 2015-07-29 DIAGNOSIS — Z13220 Encounter for screening for lipoid disorders: Secondary | ICD-10-CM

## 2015-07-29 DIAGNOSIS — M349 Systemic sclerosis, unspecified: Secondary | ICD-10-CM

## 2015-07-29 LAB — BASIC METABOLIC PANEL
BUN: 16 mg/dL (ref 6–23)
CALCIUM: 9.6 mg/dL (ref 8.4–10.5)
CHLORIDE: 104 meq/L (ref 96–112)
CO2: 30 meq/L (ref 19–32)
CREATININE: 0.82 mg/dL (ref 0.40–1.20)
GFR: 77.32 mL/min (ref >60.00)
Glucose, Bld: 84 mg/dL (ref 70–99)
Potassium: 4.9 mEq/L (ref 3.5–5.1)
Sodium: 140 mEq/L (ref 135–145)

## 2015-07-29 LAB — CBC WITH DIFFERENTIAL/PLATELET
BASOS ABS: 0 10*3/uL (ref 0.0–0.1)
BASOS PCT: 0.5 % (ref 0.0–3.0)
EOS ABS: 0.1 10*3/uL (ref 0.0–0.7)
Eosinophils Relative: 1.9 % (ref 0.0–5.0)
HEMATOCRIT: 41.7 % (ref 36.0–46.0)
Hemoglobin: 13.8 g/dL (ref 12.0–15.0)
LYMPHS ABS: 1.5 10*3/uL (ref 0.7–4.0)
Lymphocytes Relative: 28.4 % (ref 12.0–46.0)
MCHC: 33 g/dL (ref 30.0–36.0)
MCV: 90.7 fl (ref 78.0–100.0)
MONO ABS: 0.4 10*3/uL (ref 0.1–1.0)
Monocytes Relative: 8.2 % (ref 3.0–12.0)
NEUTROS ABS: 3.1 10*3/uL (ref 1.4–7.7)
NEUTROS PCT: 61 % (ref 43.0–77.0)
PLATELETS: 201 10*3/uL (ref 150.0–400.0)
RBC: 4.6 Mil/uL (ref 3.87–5.11)
RDW: 13.9 % (ref 11.5–15.5)
WBC: 5.2 10*3/uL (ref 4.0–10.5)

## 2015-07-29 LAB — LIPID PANEL
CHOL/HDL RATIO: 3
Cholesterol: 187 mg/dL (ref 0–200)
HDL: 59.4 mg/dL (ref >39.00)
LDL Cholesterol: 111 mg/dL — ABNORMAL HIGH (ref 0–99)
NonHDL: 127.97
TRIGLYCERIDES: 83 mg/dL (ref 0.0–149.0)
VLDL: 16.6 mg/dL (ref 0.0–40.0)

## 2015-07-29 LAB — HEPATIC FUNCTION PANEL
ALK PHOS: 60 U/L (ref 39–117)
ALT: 13 U/L (ref 0–35)
AST: 21 U/L (ref 0–37)
Albumin: 4.1 g/dL (ref 3.5–5.2)
BILIRUBIN DIRECT: 0.1 mg/dL (ref 0.0–0.3)
TOTAL PROTEIN: 6.6 g/dL (ref 6.0–8.3)
Total Bilirubin: 0.7 mg/dL (ref 0.2–1.2)

## 2015-07-30 ENCOUNTER — Encounter: Payer: Self-pay | Admitting: Internal Medicine

## 2015-09-01 ENCOUNTER — Encounter: Payer: Self-pay | Admitting: Internal Medicine

## 2015-09-02 ENCOUNTER — Encounter: Payer: Self-pay | Admitting: Family Medicine

## 2015-09-02 ENCOUNTER — Ambulatory Visit (INDEPENDENT_AMBULATORY_CARE_PROVIDER_SITE_OTHER): Payer: BC Managed Care – PPO | Admitting: Family Medicine

## 2015-09-02 VITALS — BP 108/62 | HR 76 | Temp 97.9°F | Ht 64.0 in | Wt 166.6 lb

## 2015-09-02 DIAGNOSIS — I7300 Raynaud's syndrome without gangrene: Secondary | ICD-10-CM | POA: Diagnosis not present

## 2015-09-02 MED ORDER — DOXYCYCLINE HYCLATE 100 MG PO TABS: 100.0000 mg | ORAL_TABLET | Freq: Two times a day (BID) | ORAL | Status: DC

## 2015-09-02 NOTE — Progress Notes (Signed)
Patient ID: Denise Arnold, female   DOB: [birth year], 55 y.o.   MRN: [id number]  Tommi Rumps, MD Phone: [phone]  Denise Arnold is a 55 y.o. female who presents today for same-day visit.  Patient reports since the beginning of January she has had discomfort in her right distal index finger. Notes worsening last 2 weeks. Has separated the skin between the nail bed proximally. There is erythema overlying the distal interphalangeal joint. Notes it is throbbing. Hard to bend the DIP joint. Finger has not been cold. No increase in warmth. No color change to the finger. Notes it felt better this morning though worsened over the last several hours. No fevers. In the past she has been treated with Bactrim and topical mupirocin for fingertip ulcers  PMH: nonsmoker.   ROS see history of present illness  Objective  Physical Exam Filed Vitals:   09/02/15 0902  BP: 108/62  Pulse: 76  Temp: 97.9 F (36.6 C)    BP Readings from Last 3 Encounters:  09/02/15 108/62  07/23/15 100/70  05/29/15 117/73   Wt Readings from Last 3 Encounters:  09/02/15 166 lb 9.6 oz (75.569 kg)  07/23/15 169 lb (76.658 kg)  05/29/15 168 lb (76.204 kg)    Physical Exam  Constitutional: She is well-developed, well-nourished, and in no distress.  HENT:  Head: Normocephalic and atraumatic.  Cardiovascular: Normal rate, regular rhythm and normal heart sounds.  Exam reveals no gallop and no friction rub.   No murmur heard. Pulmonary/Chest: Effort normal and breath sounds normal. No respiratory distress. She has no wheezes. She has no rales.  Musculoskeletal:  Right index finger with redness over DIP joint distally to the base of the fingernail and separation between finger nail and skin at the nail bed, no drainage, sensation intact, finger warm and well perfused with cap refill less than 2 seconds, finger pink, mildly tender, decreased range of motion on flexion of DIP joint, other fingers appear normal with  sensation intact and good cap refill with normal range of motion and nontender  Skin: She is not diaphoretic.     Assessment/Plan: Please see individual problem list.  Raynauds phenomenon Issue with patient's right index finger could be related to her Raynaud's phenomenon and scleroderma. Could also be related to infection. Finger is warm and well perfused and has no signs of ischemia. No systemic signs of illness. I spoke with the patient's rheumatologist Dr. Jefm Bryant over the phone and described the exam findings and history. He advised that this could be infection versus calcinosis. Advised to treat with doxycycline, analgesic, and have the patient continue to do Nitropaste between the web spaces of her fingers. He will see her in follow-up. Will have our referral coordinator obtain an appointment for the patient next several days. Patient was prescribed doxycycline. Ibuprofen or Tylenol for discomfort. Continue Nitropaste. Given return precautions.    Meds ordered this encounter  Medications  . doxycycline (VIBRA-TABS) 100 MG tablet    Sig: Take 1 tablet (100 mg total) by mouth 2 (two) times daily.    Dispense:  14 tablet    Refill:  0     Tommi Rumps

## 2015-09-02 NOTE — Patient Instructions (Signed)
Nice to meet you Her symptoms are likely related to your scleroderma, though could also be related to infection. We will treat you with an antibiotic called doxycycline. You can continue Tylenol and ibuprofen as needed for pain. He should continue to use the Nitropaste as well. If you need to move the joint, spreading redness, change in color of your finger, decreased pinkness, blackness, or blue color to your finger or your finger becomes cold or you develop any new or changing symptoms she needs seek medical attention immediately.

## 2015-09-02 NOTE — Progress Notes (Signed)
Pre visit review using our clinic review tool, if applicable. No additional management support is needed unless otherwise documented below in the visit note. 

## 2015-09-02 NOTE — Assessment & Plan Note (Signed)
Issue with patient's right index finger could be related to her Raynaud's phenomenon and scleroderma. Could also be related to infection. Finger is warm and well perfused and has no signs of ischemia. No systemic signs of illness. I spoke with the patient's rheumatologist Dr. Jefm Bryant over the phone and described the exam findings and history. He advised that this could be infection versus calcinosis. Advised to treat with doxycycline, analgesic, and have the patient continue to do Nitropaste between the web spaces of her fingers. He will see her in follow-up. Will have our referral coordinator obtain an appointment for the patient next several days. Patient was prescribed doxycycline. Ibuprofen or Tylenol for discomfort. Continue Nitropaste. Given return precautions.

## 2015-09-04 ENCOUNTER — Encounter: Payer: Self-pay | Admitting: Internal Medicine

## 2015-09-08 MED ORDER — FLUCONAZOLE 150 MG PO TABS: 150.0000 mg | ORAL_TABLET | Freq: Once | ORAL | Status: DC

## 2015-09-08 NOTE — Telephone Encounter (Signed)
Order sent in for diflucan 150mg  x 1

## 2016-01-25 ENCOUNTER — Ambulatory Visit (INDEPENDENT_AMBULATORY_CARE_PROVIDER_SITE_OTHER): Payer: BC Managed Care – PPO | Admitting: Internal Medicine

## 2016-01-25 ENCOUNTER — Telehealth: Payer: Self-pay | Admitting: *Deleted

## 2016-01-25 ENCOUNTER — Encounter: Payer: Self-pay | Admitting: Internal Medicine

## 2016-01-25 VITALS — BP 110/70 | HR 69 | Temp 97.9°F | Resp 18 | Ht 64.0 in | Wt 168.0 lb

## 2016-01-25 DIAGNOSIS — Z1239 Encounter for other screening for malignant neoplasm of breast: Secondary | ICD-10-CM

## 2016-01-25 DIAGNOSIS — K2100 Gastro-esophageal reflux disease with esophagitis, without bleeding: Secondary | ICD-10-CM

## 2016-01-25 DIAGNOSIS — I7300 Raynaud's syndrome without gangrene: Secondary | ICD-10-CM

## 2016-01-25 DIAGNOSIS — E559 Vitamin D deficiency, unspecified: Secondary | ICD-10-CM

## 2016-01-25 DIAGNOSIS — L98499 Non-pressure chronic ulcer of skin of other sites with unspecified severity: Secondary | ICD-10-CM

## 2016-01-25 DIAGNOSIS — Z0000 Encounter for general adult medical examination without abnormal findings: Secondary | ICD-10-CM | POA: Diagnosis not present

## 2016-01-25 DIAGNOSIS — Z86010 Personal history of colonic polyps: Secondary | ICD-10-CM

## 2016-01-25 DIAGNOSIS — M349 Systemic sclerosis, unspecified: Secondary | ICD-10-CM

## 2016-01-25 DIAGNOSIS — Z13220 Encounter for screening for lipoid disorders: Secondary | ICD-10-CM

## 2016-01-25 DIAGNOSIS — M5030 Other cervical disc degeneration, unspecified cervical region: Secondary | ICD-10-CM

## 2016-01-25 DIAGNOSIS — M25531 Pain in right wrist: Secondary | ICD-10-CM

## 2016-01-25 NOTE — Assessment & Plan Note (Addendum)
Physical today 01/25/16.  PAP 01/20/15- negative with negative HPV.  Colonoscopy 03/2013.  Recommended f/u colonoscopy in 03/2018.  She reported that she had her colonoscopy 05/2015 and states not due until 2021.  Obtain colonoscopy report.   Mammogram 03/02/15 - Birads I.  Schedule f/u mammogram.

## 2016-01-25 NOTE — Telephone Encounter (Signed)
Orders placed for labs

## 2016-01-25 NOTE — Progress Notes (Signed)
Patient ID: Denise Arnold, female   DOB: 07-10-1961, 55 y.o.   MRN: [id number]   Subjective:    Patient ID: Denise Arnold, female    DOB: 31-May-1961, 55 y.o.   MRN: [id number]  HPI  Patient here for her physical exam.  She states she is doing well.  Still exercising.  No cardiac symptoms with increased activity or exertion.  No sob.  No acid reflux.  Finger looks better.  No ulcerations.  No abdominal pain or cramping.  Bowels stable.  Does report increased pain in her right wrist. Notices more when she is using her hand to push herself up (with flexion of her wrist).  Notices when doing a push up.  Started with soreness in her right forearm.  Now localized to her wrist and notices when in a strained or flexed position.  Notices when working on her computer.  Also report some neck tightness.  Massage helps.  No numbness or tingling in upper extremities.     Past Medical History  Diagnosis Date  . Scleroderma (HCC)     positive FANA, positive SCL-70 abs, raynauds, mild pulmonary hypertension, normal DLCO  . GERD (gastroesophageal reflux disease)   . Tricuspid regurgitation   . History of endoscopy 04/05/2013    upper  . Menorrhagia   . 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 Vitamin D deficiency   . Arthritis    Past Surgical History  Procedure Laterality Date  . Dilation and curettage of uterus  1992  . Dilation and curettage of uterus  1993  . Cesarean section  1994  . Dilation and curettage of uterus  2011  . Vein surgery    . Colonoscopy with propofol N/A 05/29/2015    Procedure: COLONOSCOPY WITH PROPOFOL;  Surgeon: Manya Silvas, MD;  Location: [facility] ENDOSCOPY;  Service: Endoscopy;  Laterality: N/A;   Family History  Problem Relation Age of Onset  . Heart disease Father     myocardial infarction  . Arthritis/Rheumatoid Father   . Hypertension Father   . Hyperlipidemia Mother   . Breast cancer Maternal Grandmother   . Colon cancer Neg Hx    Social History   Social History  .  Marital Status: Married    Spouse Name: N/A  . Number of Children: 1  . Years of Education: N/A   Social History Main Topics  . Smoking status: Never Smoker   . Smokeless tobacco: Never Used  . Alcohol Use: No  . Drug Use: No  . Sexual Activity: Not Asked   Other Topics Concern  . None   Social History Narrative    Outpatient Encounter Prescriptions as of 01/25/2016  Medication Sig  . amLODipine (NORVASC) 5 MG tablet Take 5 mg by mouth 2 (two) times daily.   . Calcium-Magnesium-Vitamin D (CALCIUM 500 PO) Take by mouth.  . cholecalciferol (VITAMIN D) 1000 UNITS tablet Take 1,000 Units by mouth daily.  . fish oil-omega-3 fatty acids 1000 MG capsule Take 1 g by mouth daily.  Marland Kitchen glucosamine-chondroitin 500-400 MG tablet Take 1 tablet by mouth daily.  . Multiple Vitamin (MULTIVITAMIN) tablet Take 1 tablet by mouth daily.  . nitroGLYCERIN (NITROGLYN) 2 % ointment Apply 0.5 inches topically 3 (three) times daily as needed.   Marland Kitchen omeprazole (PRILOSEC) 20 MG capsule Take 20 mg by mouth 2 (two) times daily.   . Probiotic Product (PROBIOTIC DAILY PO) Take by mouth.  . [DISCONTINUED] doxycycline (VIBRA-TABS) 100 MG tablet Take 1 tablet (100 mg total)  by mouth 2 (two) times daily.  . [DISCONTINUED] fluconazole (DIFLUCAN) 150 MG tablet Take 1 tablet (150 mg total) by mouth once.  . [DISCONTINUED] TURMERIC PO Take by mouth.   No facility-administered encounter medications on file as of 01/25/2016.    Review of Systems  Constitutional: Negative for appetite change and unexpected weight change.  HENT: Negative for congestion and sinus pressure.   Eyes: Negative for pain and visual disturbance.  Respiratory: Negative for cough, chest tightness and shortness of breath.   Cardiovascular: Negative for chest pain, palpitations and leg swelling.  Gastrointestinal: Negative for nausea, vomiting, abdominal pain and diarrhea.  Genitourinary: Negative for dysuria and difficulty urinating.    Musculoskeletal: Negative for back pain.       Right wrist pain as outlined.   Skin: Negative for color change and rash.  Neurological: Negative for dizziness, light-headedness and headaches.  Hematological: Negative for adenopathy. Does not bruise/bleed easily.  Psychiatric/Behavioral: Negative for dysphoric mood and agitation.       Objective:    Physical Exam  Constitutional: She is oriented to person, place, and time. She appears well-developed and well-nourished. No distress.  HENT:  Nose: Nose normal.  Mouth/Throat: Oropharynx is clear and moist.  Eyes: Right eye exhibits no discharge. Left eye exhibits no discharge. No scleral icterus.  Neck: Neck supple. No thyromegaly present.  Cardiovascular: Normal rate and regular rhythm.   Pulmonary/Chest: Breath sounds normal. No accessory muscle usage. No tachypnea. No respiratory distress. She has no decreased breath sounds. She has no wheezes. She has no rhonchi. Right breast exhibits no inverted nipple, no mass, no nipple discharge and no tenderness (no axillary adenopathy). Left breast exhibits no inverted nipple, no mass, no nipple discharge and no tenderness (no axilarry adenopathy).  Abdominal: Soft. Bowel sounds are normal. There is no tenderness.  Musculoskeletal: She exhibits no edema or tenderness.  Increased pain in her right wrist with flexion.  No pain right forearm.  Good rom of neck.    Lymphadenopathy:    She has no cervical adenopathy.  Neurological: She is alert and oriented to person, place, and time.  Skin: Skin is warm. No rash noted. No erythema.  Psychiatric: She has a normal mood and affect. Her behavior is normal.    BP 110/70 mmHg  Pulse 69  Temp(Src) 97.9 F (36.6 C) (Oral)  Resp 18  Ht 5\' 4"  (1.626 m)  Wt 168 lb (76.204 kg)  BMI 28.82 kg/m2  SpO2 98%  LMP 02/14/2010 Wt Readings from Last 3 Encounters:  01/25/16 168 lb (76.204 kg)  09/02/15 166 lb 9.6 oz (75.569 kg)  07/23/15 169 lb (76.658 kg)      Lab Results  Component Value Date   WBC 5.2 07/29/2015   HGB 13.8 07/29/2015   HCT 41.7 07/29/2015   PLT 201.0 07/29/2015   GLUCOSE 84 07/29/2015   CHOL 187 07/29/2015   TRIG 83.0 07/29/2015   HDL 59.40 07/29/2015   LDLCALC 111* 07/29/2015   ALT 13 07/29/2015   AST 21 07/29/2015   NA 140 07/29/2015   K 4.9 07/29/2015   CL 104 07/29/2015   CREATININE 0.82 07/29/2015   BUN 16 07/29/2015   CO2 30 07/29/2015   TSH 1.79 01/21/2015       Assessment & Plan:   Problem List Items Addressed This Visit    Degenerative cervical disc    Some stiffness as outlined.  Massage helped.  Not a significant issue today.  Follow.  Finger ulcer ([place])    Resolved.  Still some changes with her nail and finger tip, but ulceration resolved.        GERD (gastroesophageal reflux disease)    Had EGD 03/26/13 as outlined.  Upper symptoms controlled on prilosec.       Relevant Medications   Probiotic Product (PROBIOTIC DAILY PO)   Health care maintenance    Physical today 01/25/16.  PAP 01/20/15- negative with negative HPV.  Colonoscopy 03/2013.  Recommended f/u colonoscopy in 03/2018.  She reported that she had her colonoscopy 05/2015 and states not due until 2021.  Obtain colonoscopy report.   Mammogram 03/02/15 - Birads I.  Schedule f/u mammogram.       History of colonic polyps    States had colonoscopy 05/2015.  Recommended f/u in 2021.       Raynauds phenomenon    Stable.  On amlodipine.       Relevant Orders   Comprehensive metabolic panel   TSH   Scleroderma (HCC)    Stable.  Followed by Dr Jefm Bryant.        Vitamin D deficiency    Continue vitamin D supplements.  Follow.       Wrist pain, right    Hold on xray.  Wrist splint.  Follow.  Due to see Dr Jefm Bryant next week.  Will discuss with him at appt.        Other Visit Diagnoses    Routine general medical examination at a health care facility    -  Primary    Screening breast examination        Relevant Orders     MM DIGITAL SCREENING BILATERAL    Screening cholesterol level        Relevant Orders    Lipid panel        Einar Pheasant, MD

## 2016-01-25 NOTE — Progress Notes (Signed)
Pre-visit discussion using our clinic review tool. No additional management support is needed unless otherwise documented below in the visit note.  

## 2016-01-25 NOTE — Telephone Encounter (Signed)
Please advise and order for labs, last labs in January of 2017. thanks

## 2016-01-25 NOTE — Telephone Encounter (Signed)
Patient is scheduled for labs 01/27/16, she stated that she  Has labs with her physical appt.  Please advise if orders a placed

## 2016-01-26 ENCOUNTER — Encounter: Payer: Self-pay | Admitting: Internal Medicine

## 2016-01-26 DIAGNOSIS — M25531 Pain in right wrist: Secondary | ICD-10-CM | POA: Insufficient documentation

## 2016-01-26 NOTE — Assessment & Plan Note (Signed)
Continue vitamin D supplements.  Follow.   

## 2016-01-26 NOTE — Assessment & Plan Note (Signed)
Some stiffness as outlined.  Massage helped.  Not a significant issue today.  Follow.

## 2016-01-26 NOTE — Assessment & Plan Note (Signed)
Hold on xray.  Wrist splint.  Follow.  Due to see Dr Jefm Bryant next week.  Will discuss with him at appt.

## 2016-01-26 NOTE — Assessment & Plan Note (Signed)
States had colonoscopy 05/2015.  Recommended f/u in 2021.

## 2016-01-26 NOTE — Assessment & Plan Note (Signed)
Stable.  Followed by Dr Kernodle.   

## 2016-01-26 NOTE — Assessment & Plan Note (Signed)
Had EGD 03/26/13 as outlined.  Upper symptoms controlled on prilosec.

## 2016-01-26 NOTE — Assessment & Plan Note (Signed)
Resolved.  Still some changes with her nail and finger tip, but ulceration resolved.

## 2016-01-26 NOTE — Assessment & Plan Note (Signed)
Stable.  On amlodipine.

## 2016-01-27 ENCOUNTER — Other Ambulatory Visit (INDEPENDENT_AMBULATORY_CARE_PROVIDER_SITE_OTHER): Payer: BC Managed Care – PPO

## 2016-01-27 ENCOUNTER — Encounter: Payer: Self-pay | Admitting: Internal Medicine

## 2016-01-27 DIAGNOSIS — I7300 Raynaud's syndrome without gangrene: Secondary | ICD-10-CM | POA: Diagnosis not present

## 2016-01-27 DIAGNOSIS — Z13220 Encounter for screening for lipoid disorders: Secondary | ICD-10-CM

## 2016-01-27 LAB — LIPID PANEL
CHOL/HDL RATIO: 3
Cholesterol: 180 mg/dL (ref 0–200)
HDL: 57.9 mg/dL (ref >39.00)
LDL CALC: 100 mg/dL — AB (ref 0–99)
NONHDL: 122
Triglycerides: 109 mg/dL (ref 0.0–149.0)
VLDL: 21.8 mg/dL (ref 0.0–40.0)

## 2016-01-27 LAB — COMPREHENSIVE METABOLIC PANEL
ALT: 11 U/L (ref 0–35)
AST: 17 U/L (ref 0–37)
Albumin: 3.9 g/dL (ref 3.5–5.2)
Alkaline Phosphatase: 56 U/L (ref 39–117)
BILIRUBIN TOTAL: 0.6 mg/dL (ref 0.2–1.2)
BUN: 13 mg/dL (ref 6–23)
CALCIUM: 9.6 mg/dL (ref 8.4–10.5)
CHLORIDE: 106 meq/L (ref 96–112)
CO2: 30 mEq/L (ref 19–32)
Creatinine, Ser: 0.81 mg/dL (ref 0.40–1.20)
GFR: 78.27 mL/min (ref >60.00)
Glucose, Bld: 94 mg/dL (ref 70–99)
POTASSIUM: 4 meq/L (ref 3.5–5.1)
Sodium: 140 mEq/L (ref 135–145)
Total Protein: 6.8 g/dL (ref 6.0–8.3)

## 2016-01-27 LAB — TSH: TSH: 2.28 u[IU]/mL (ref 0.35–4.50)

## 2016-03-02 ENCOUNTER — Ambulatory Visit
Admission: RE | Admit: 2016-03-02 | Discharge: 2016-03-02 | Disposition: A | Discharge status: Home / Self Care | Payer: BC Managed Care – PPO | Source: Ambulatory Visit | Attending: Internal Medicine | Admitting: Internal Medicine

## 2016-03-02 ENCOUNTER — Other Ambulatory Visit: Payer: Self-pay | Admitting: Internal Medicine

## 2016-03-02 DIAGNOSIS — Z1239 Encounter for other screening for malignant neoplasm of breast: Secondary | ICD-10-CM | POA: Diagnosis not present

## 2016-03-02 DIAGNOSIS — Z1231 Encounter for screening mammogram for malignant neoplasm of breast: Secondary | ICD-10-CM | POA: Diagnosis not present

## 2016-07-20 ENCOUNTER — Encounter: Payer: Self-pay | Admitting: Internal Medicine

## 2016-07-26 ENCOUNTER — Ambulatory Visit: Payer: BC Managed Care – PPO | Admitting: Internal Medicine

## 2016-07-27 ENCOUNTER — Ambulatory Visit: Payer: BC Managed Care – PPO | Admitting: Internal Medicine

## 2016-07-27 ENCOUNTER — Ambulatory Visit (INDEPENDENT_AMBULATORY_CARE_PROVIDER_SITE_OTHER): Payer: BC Managed Care – PPO | Admitting: Internal Medicine

## 2016-07-27 ENCOUNTER — Encounter: Payer: Self-pay | Admitting: Internal Medicine

## 2016-07-27 VITALS — BP 130/70 | HR 66 | Wt 166.0 lb

## 2016-07-27 DIAGNOSIS — E559 Vitamin D deficiency, unspecified: Secondary | ICD-10-CM | POA: Diagnosis not present

## 2016-07-27 DIAGNOSIS — Z13220 Encounter for screening for lipoid disorders: Secondary | ICD-10-CM | POA: Diagnosis not present

## 2016-07-27 DIAGNOSIS — K2100 Gastro-esophageal reflux disease with esophagitis, without bleeding: Secondary | ICD-10-CM

## 2016-07-27 DIAGNOSIS — I7300 Raynaud's syndrome without gangrene: Secondary | ICD-10-CM | POA: Diagnosis not present

## 2016-07-27 DIAGNOSIS — M349 Systemic sclerosis, unspecified: Secondary | ICD-10-CM | POA: Diagnosis not present

## 2016-07-27 DIAGNOSIS — L98499 Non-pressure chronic ulcer of skin of other sites with unspecified severity: Secondary | ICD-10-CM

## 2016-07-27 LAB — COMPREHENSIVE METABOLIC PANEL
ALBUMIN: 4 g/dL (ref 3.5–5.2)
ALK PHOS: 58 U/L (ref 39–117)
ALT: 11 U/L (ref 0–35)
AST: 17 U/L (ref 0–37)
BILIRUBIN TOTAL: 0.7 mg/dL (ref 0.2–1.2)
BUN: 14 mg/dL (ref 6–23)
CALCIUM: 9.5 mg/dL (ref 8.4–10.5)
CHLORIDE: 105 meq/L (ref 96–112)
CO2: 27 mEq/L (ref 19–32)
CREATININE: 0.81 mg/dL (ref 0.40–1.20)
GFR: 78.13 mL/min (ref >60.00)
Glucose, Bld: 90 mg/dL (ref 70–99)
Potassium: 4.3 mEq/L (ref 3.5–5.1)
SODIUM: 140 meq/L (ref 135–145)
TOTAL PROTEIN: 7 g/dL (ref 6.0–8.3)

## 2016-07-27 LAB — CBC WITH DIFFERENTIAL/PLATELET
Basophils Absolute: 0 10*3/uL (ref 0.0–0.1)
Basophils Relative: 0.4 % (ref 0.0–3.0)
EOS ABS: 0.1 10*3/uL (ref 0.0–0.7)
EOS PCT: 1.8 % (ref 0.0–5.0)
HEMATOCRIT: 40.3 % (ref 36.0–46.0)
HEMOGLOBIN: 13.7 g/dL (ref 12.0–15.0)
LYMPHS PCT: 28 % (ref 12.0–46.0)
Lymphs Abs: 1.5 10*3/uL (ref 0.7–4.0)
MCHC: 34.1 g/dL (ref 30.0–36.0)
MCV: 90.1 fl (ref 78.0–100.0)
MONO ABS: 0.4 10*3/uL (ref 0.1–1.0)
Monocytes Relative: 7.6 % (ref 3.0–12.0)
Neutro Abs: 3.3 10*3/uL (ref 1.4–7.7)
Neutrophils Relative %: 62.2 % (ref 43.0–77.0)
Platelets: 224 10*3/uL (ref 150.0–400.0)
RBC: 4.47 Mil/uL (ref 3.87–5.11)
RDW: 13.7 % (ref 11.5–15.5)
WBC: 5.3 10*3/uL (ref 4.0–10.5)

## 2016-07-27 LAB — LIPID PANEL
CHOLESTEROL: 197 mg/dL (ref 0–200)
HDL: 62 mg/dL (ref >39.00)
LDL Cholesterol: 117 mg/dL — ABNORMAL HIGH (ref 0–99)
NonHDL: 135.49
TRIGLYCERIDES: 93 mg/dL (ref 0.0–149.0)
Total CHOL/HDL Ratio: 3
VLDL: 18.6 mg/dL (ref 0.0–40.0)

## 2016-07-27 LAB — VITAMIN D 25 HYDROXY (VIT D DEFICIENCY, FRACTURES): VITD: 94.48 ng/mL (ref 30.00–100.00)

## 2016-07-27 NOTE — Progress Notes (Signed)
Patient ID: Denise Arnold, female   DOB: 10-Mar-1961, 56 y.o.   MRN: [id number]   Subjective:    Patient ID: Denise Arnold, female    DOB: 1961/05/17, 56 y.o.   MRN: [id number]  HPI  Patient here for a scheduled follow up.  States she is doing well.  Is exercising regularly.  No chest pain.  No sob.  No acid reflux. No abdominal pain or cramping.  Bowels stable.  Fingers doing better.     Past Medical History:  Diagnosis Date  . Arthritis   . GERD (gastroesophageal reflux disease)   . History of endoscopy 04/05/2013   upper  . Menorrhagia   . 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 Scleroderma (HCC)    positive FANA, positive SCL-70 abs, raynauds, mild pulmonary hypertension, normal DLCO  . Tricuspid regurgitation   . Vitamin D deficiency    Past Surgical History:  Procedure Laterality Date  . CESAREAN SECTION  1994  . COLONOSCOPY WITH PROPOFOL N/A 05/29/2015   Procedure: COLONOSCOPY WITH PROPOFOL;  Surgeon: Manya Silvas, MD;  Location: [facility] ENDOSCOPY;  Service: Endoscopy;  Laterality: N/A;  . DILATION AND CURETTAGE OF UTERUS  1992  . DILATION AND CURETTAGE OF UTERUS  1993  . DILATION AND CURETTAGE OF UTERUS  2011  . VEIN SURGERY     Family History  Problem Relation Age of Onset  . Heart disease Father     myocardial infarction  . Arthritis/Rheumatoid Father   . Hypertension Father   . Hyperlipidemia Mother   . Breast cancer Maternal Grandmother   . Colon cancer Neg Hx    Social History   Social History  . Marital status: Married    Spouse name: N/A  . Number of children: 1  . Years of education: N/A   Social History Main Topics  . Smoking status: Never Smoker  . Smokeless tobacco: Never Used  . Alcohol use No  . Drug use: No  . Sexual activity: Not Asked   Other Topics Concern  . None   Social History Narrative  . None    Outpatient Encounter Prescriptions as of 07/27/2016  Medication Sig  . amLODipine (NORVASC) 5 MG tablet Take 5 mg by mouth 2 (two) times daily.    . Calcium-Magnesium-Vitamin D (CALCIUM 500 PO) Take by mouth.  . cholecalciferol (VITAMIN D) 1000 UNITS tablet Take 1,000 Units by mouth daily.  . fish oil-omega-3 fatty acids 1000 MG capsule Take 1 g by mouth daily.  Marland Kitchen glucosamine-chondroitin 500-400 MG tablet Take 1 tablet by mouth daily.  . Multiple Vitamin (MULTIVITAMIN) tablet Take 1 tablet by mouth daily.  . nitroGLYCERIN (NITROGLYN) 2 % ointment Apply 0.5 inches topically 3 (three) times daily as needed.   Marland Kitchen omeprazole (PRILOSEC) 20 MG capsule Take 20 mg by mouth 2 (two) times daily.   . Probiotic Product (PROBIOTIC DAILY PO) Take by mouth.   No facility-administered encounter medications on file as of 07/27/2016.     Review of Systems  Constitutional: Negative for appetite change and unexpected weight change.  HENT: Negative for congestion and sinus pressure.   Respiratory: Negative for cough, chest tightness and shortness of breath.   Cardiovascular: Negative for chest pain, palpitations and leg swelling.  Gastrointestinal: Negative for abdominal pain, diarrhea, nausea and vomiting.  Genitourinary: Negative for difficulty urinating and dysuria.  Musculoskeletal: Negative for back pain and joint swelling.  Skin: Negative for color change and rash.  Neurological: Negative for dizziness, light-headedness and headaches.  Psychiatric/Behavioral: Negative for agitation and dysphoric mood.       Objective:     Blood pressure rechecked by me:  110/72  Physical Exam  Constitutional: She appears well-developed and well-nourished. No distress.  HENT:  Nose: Nose normal.  Mouth/Throat: Oropharynx is clear and moist.  Neck: Neck supple. No thyromegaly present.  Cardiovascular: Normal rate and regular rhythm.   Pulmonary/Chest: Breath sounds normal. No respiratory distress. She has no wheezes.  Abdominal: Soft. Bowel sounds are normal. There is no tenderness.  Musculoskeletal: She exhibits no edema or tenderness.    Lymphadenopathy:    She has no cervical adenopathy.  Skin: No rash noted. No erythema.  Psychiatric: She has a normal mood and affect. Her behavior is normal.    BP 130/70   Pulse 66   Wt 166 lb (75.3 kg)   LMP 02/14/2010   SpO2 98%   BMI 28.49 kg/m  Wt Readings from Last 3 Encounters:  07/27/16 166 lb (75.3 kg)  01/25/16 168 lb (76.2 kg)  09/02/15 166 lb 9.6 oz (75.6 kg)     Lab Results  Component Value Date   WBC 5.3 07/27/2016   HGB 13.7 07/27/2016   HCT 40.3 07/27/2016   PLT 224.0 07/27/2016   GLUCOSE 90 07/27/2016   CHOL 197 07/27/2016   TRIG 93.0 07/27/2016   HDL 62.00 07/27/2016   LDLCALC 117 (H) 07/27/2016   ALT 11 07/27/2016   AST 17 07/27/2016   NA 140 07/27/2016   K 4.3 07/27/2016   CL 105 07/27/2016   CREATININE 0.81 07/27/2016   BUN 14 07/27/2016   CO2 27 07/27/2016   TSH 2.28 01/27/2016    Mm Screening Breast Tomo Bilateral  Result Date: 03/02/2016 CLINICAL DATA:  Screening. EXAM: 2D DIGITAL SCREENING BILATERAL MAMMOGRAM WITH CAD AND ADJUNCT TOMO COMPARISON:  Previous exam(s). ACR Breast Density Category c: The breast tissue is heterogeneously dense, which may obscure small masses. FINDINGS: There are no findings suspicious for malignancy. Images were processed with CAD. IMPRESSION: No mammographic evidence of malignancy. A result letter of this screening mammogram will be mailed directly to the patient. RECOMMENDATION: Screening mammogram in one year. (Code:SM-B-01Y) BI-RADS CATEGORY  1: Negative. Electronically Signed   By: Pamelia Hoit M.D.   On: 03/02/2016 17:20       Assessment & Plan:   Problem List Items Addressed This Visit    Finger ulcer ([place])    Resolved.  Better.  Continue amlodipine.        GERD (gastroesophageal reflux disease)    Controlled on prilosec.        Raynauds phenomenon    On amlodipine.  Followed by Dr Jefm Bryant.        Scleroderma (HCC)    Stable.  Followed by Dr Jefm Bryant.        Relevant Orders   CBC with  Differential/Platelet (Completed)   Comprehensive metabolic panel (Completed)   Vitamin D deficiency    Continue vitamin D supplements.  Follow.       Relevant Orders   VITAMIN D 25 Hydroxy (Vit-D Deficiency, Fractures) (Completed)    Other Visit Diagnoses    Screening cholesterol level    -  Primary   Relevant Orders   Lipid panel (Completed)       Einar Pheasant, MD

## 2016-07-28 ENCOUNTER — Encounter: Payer: Self-pay | Admitting: Internal Medicine

## 2016-08-01 ENCOUNTER — Encounter: Payer: Self-pay | Admitting: Internal Medicine

## 2016-08-01 NOTE — Assessment & Plan Note (Signed)
On amlodipine.  Followed by Dr Kernodle.   

## 2016-08-01 NOTE — Assessment & Plan Note (Signed)
Controlled on prilosec.   

## 2016-08-01 NOTE — Assessment & Plan Note (Signed)
Stable.  Followed by Dr Kernodle.   

## 2016-08-01 NOTE — Assessment & Plan Note (Signed)
Resolved.  Better.  Continue amlodipine.

## 2016-08-01 NOTE — Assessment & Plan Note (Signed)
Continue vitamin D supplements.  Follow.   

## 2016-08-02 DIAGNOSIS — M349 Systemic sclerosis, unspecified: Secondary | ICD-10-CM | POA: Diagnosis not present

## 2016-08-02 DIAGNOSIS — I7300 Raynaud's syndrome without gangrene: Secondary | ICD-10-CM | POA: Diagnosis not present

## 2016-10-19 ENCOUNTER — Ambulatory Visit (INDEPENDENT_AMBULATORY_CARE_PROVIDER_SITE_OTHER): Payer: BC Managed Care – PPO | Admitting: Family Medicine

## 2016-10-19 ENCOUNTER — Encounter: Payer: Self-pay | Admitting: Family Medicine

## 2016-10-19 DIAGNOSIS — R05 Cough: Secondary | ICD-10-CM

## 2016-10-19 DIAGNOSIS — R059 Cough, unspecified: Secondary | ICD-10-CM | POA: Insufficient documentation

## 2016-10-19 MED ORDER — PREDNISONE 50 MG PO TABS: ORAL_TABLET | ORAL | 0 refills | Status: DC

## 2016-10-19 NOTE — Assessment & Plan Note (Signed)
New problem. Exam unremarkable. Treating with prednisone.

## 2016-10-19 NOTE — Patient Instructions (Signed)
Prednisone as prescribed.  Let us know if you worse or fail to improve.  Take care  Dr. Lacinda Axon

## 2016-10-19 NOTE — Progress Notes (Signed)
Pre visit review using our clinic review tool, if applicable. No additional management support is needed unless otherwise documented below in the visit note. 

## 2016-10-19 NOTE — Progress Notes (Signed)
   Subjective:  Patient ID: Denise Arnold, female    DOB: [birth year]  Age: 56 y.o. MRN: [id number]  CC: Cough  HPI:  56 year old female with scleroderma presents with complaints of cough.  Cough  1 week.  Nonproductive. Moderate in severity.  No associated fever or shortness of breath.  She been taking Mucinex and Robitussin without significant improvement.  Report some associated wheezing.  No known exacerbating factors.  No other associated symptoms.  No other complaints or concerns at this time.   Social Hx   Social History   Social History  . Marital status: Married    Spouse name: N/A  . Number of children: 1  . Years of education: N/A   Social History Main Topics  . Smoking status: Never Smoker  . Smokeless tobacco: Never Used  . Alcohol use No  . Drug use: No  . Sexual activity: Not Asked   Other Topics Concern  . None   Social History Narrative  . None    Review of Systems  Constitutional: Negative.   Respiratory: Positive for cough.    Objective:  BP 130/68   Pulse 96   Temp 97.8 F (36.6 C) (Oral)   Wt 166 lb 2 oz (75.4 kg)   LMP 02/14/2010   SpO2 93%   BMI 28.52 kg/m   BP/Weight 10/19/2016 07/27/2016 3/83/3383  Systolic BP 291 916 606  Diastolic BP 68 70 70  Wt. (Lbs) 166.13 166 168  BMI 28.52 28.49 28.82    Physical Exam  Constitutional: She is oriented to person, place, and time. She appears well-developed. No distress.  HENT:  Mouth/Throat: Oropharynx is clear and moist.  Cardiovascular: Normal rate and regular rhythm.   Pulmonary/Chest: Effort normal and breath sounds normal. She has no wheezes. She has no rales.  Neurological: She is alert and oriented to person, place, and time.  Psychiatric: She has a normal mood and affect.  Vitals reviewed.  Lab Results  Component Value Date   WBC 5.3 07/27/2016   HGB 13.7 07/27/2016   HCT 40.3 07/27/2016   PLT 224.0 07/27/2016   GLUCOSE 90 07/27/2016   CHOL 197 07/27/2016   TRIG 93.0 07/27/2016   HDL 62.00 07/27/2016   LDLCALC 117 (H) 07/27/2016   ALT 11 07/27/2016   AST 17 07/27/2016   NA 140 07/27/2016   K 4.3 07/27/2016   CL 105 07/27/2016   CREATININE 0.81 07/27/2016   BUN 14 07/27/2016   CO2 27 07/27/2016   TSH 2.28 01/27/2016    Assessment & Plan:   Problem List Items Addressed This Visit    Cough    New problem. Exam unremarkable. Treating with prednisone.         Meds ordered this encounter  Medications  . predniSONE (DELTASONE) 50 MG tablet    Sig: 1 tablet daily x 5 days.    Dispense:  5 tablet    Refill:  0   Follow-up: PRN  Denise Arnold

## 2016-10-21 ENCOUNTER — Encounter: Payer: Self-pay | Admitting: Internal Medicine

## 2016-10-24 NOTE — Telephone Encounter (Signed)
If symptoms are improved, may not need abx  If persistent symptoms or feel needs abx, I can reevaluate to confirm.   Just let me know if feels needs anything.

## 2016-10-24 NOTE — Telephone Encounter (Signed)
Spoke with patient states cough has got better if it doesn't improve will let us know .

## 2016-11-03 ENCOUNTER — Encounter: Payer: Self-pay | Admitting: Internal Medicine

## 2016-11-05 DIAGNOSIS — N390 Urinary tract infection, site not specified: Secondary | ICD-10-CM | POA: Diagnosis not present

## 2016-11-07 ENCOUNTER — Encounter: Payer: Self-pay | Admitting: Internal Medicine

## 2016-11-16 ENCOUNTER — Encounter: Payer: Self-pay | Admitting: Internal Medicine

## 2016-11-16 DIAGNOSIS — N3000 Acute cystitis without hematuria: Secondary | ICD-10-CM

## 2016-11-17 ENCOUNTER — Other Ambulatory Visit (INDEPENDENT_AMBULATORY_CARE_PROVIDER_SITE_OTHER): Payer: BC Managed Care – PPO

## 2016-11-17 ENCOUNTER — Telehealth: Payer: Self-pay | Admitting: Radiology

## 2016-11-17 DIAGNOSIS — N3000 Acute cystitis without hematuria: Secondary | ICD-10-CM

## 2016-11-17 LAB — URINALYSIS, ROUTINE W REFLEX MICROSCOPIC
Bilirubin Urine: NEGATIVE
Hgb urine dipstick: NEGATIVE
Ketones, ur: NEGATIVE
Leukocytes, UA: NEGATIVE
Nitrite: NEGATIVE
PH: 5.5 (ref 5.0–8.0)
RBC / HPF: NONE SEEN (ref 0–?)
TOTAL PROTEIN, URINE-UPE24: NEGATIVE
UROBILINOGEN UA: 0.2 (ref 0.0–1.0)
Urine Glucose: NEGATIVE

## 2016-11-17 NOTE — Telephone Encounter (Signed)
Order placed for urinalysis and culture

## 2016-11-17 NOTE — Telephone Encounter (Signed)
Pt was put on lab schedule today, please place future orders. Thank you.

## 2016-11-17 NOTE — Telephone Encounter (Signed)
I have placed the order

## 2016-11-17 NOTE — Telephone Encounter (Signed)
I have spoke to patient she would like to leave sample. I have put on lab schedule but will need orders. She lives out of town and would like to do while she is near our office today.

## 2016-11-18 ENCOUNTER — Encounter: Payer: Self-pay | Admitting: Internal Medicine

## 2016-11-18 LAB — URINE CULTURE: ORGANISM ID, BACTERIA: NO GROWTH

## 2016-11-19 ENCOUNTER — Encounter: Payer: Self-pay | Admitting: Internal Medicine

## 2017-01-30 DIAGNOSIS — I7300 Raynaud's syndrome without gangrene: Secondary | ICD-10-CM | POA: Diagnosis not present

## 2017-01-30 DIAGNOSIS — M7062 Trochanteric bursitis, left hip: Secondary | ICD-10-CM | POA: Diagnosis not present

## 2017-01-30 DIAGNOSIS — M349 Systemic sclerosis, unspecified: Secondary | ICD-10-CM | POA: Diagnosis not present

## 2017-02-01 ENCOUNTER — Encounter: Payer: Self-pay | Admitting: Internal Medicine

## 2017-02-01 ENCOUNTER — Ambulatory Visit (INDEPENDENT_AMBULATORY_CARE_PROVIDER_SITE_OTHER): Payer: BC Managed Care – PPO | Admitting: Internal Medicine

## 2017-02-01 VITALS — BP 130/66 | HR 67 | Temp 97.8°F | Resp 12 | Ht 64.0 in | Wt 163.2 lb

## 2017-02-01 DIAGNOSIS — I7300 Raynaud's syndrome without gangrene: Secondary | ICD-10-CM | POA: Diagnosis not present

## 2017-02-01 DIAGNOSIS — E559 Vitamin D deficiency, unspecified: Secondary | ICD-10-CM | POA: Diagnosis not present

## 2017-02-01 DIAGNOSIS — L98499 Non-pressure chronic ulcer of skin of other sites with unspecified severity: Secondary | ICD-10-CM | POA: Diagnosis not present

## 2017-02-01 DIAGNOSIS — Z1231 Encounter for screening mammogram for malignant neoplasm of breast: Secondary | ICD-10-CM

## 2017-02-01 DIAGNOSIS — K2100 Gastro-esophageal reflux disease with esophagitis, without bleeding: Secondary | ICD-10-CM

## 2017-02-01 DIAGNOSIS — M349 Systemic sclerosis, unspecified: Secondary | ICD-10-CM | POA: Diagnosis not present

## 2017-02-01 DIAGNOSIS — Z13220 Encounter for screening for lipoid disorders: Secondary | ICD-10-CM | POA: Diagnosis not present

## 2017-02-01 DIAGNOSIS — Z0000 Encounter for general adult medical examination without abnormal findings: Secondary | ICD-10-CM

## 2017-02-01 DIAGNOSIS — Z124 Encounter for screening for malignant neoplasm of cervix: Secondary | ICD-10-CM

## 2017-02-01 DIAGNOSIS — Z860100 Personal history of colon polyps, unspecified: Secondary | ICD-10-CM

## 2017-02-01 DIAGNOSIS — Z1239 Encounter for other screening for malignant neoplasm of breast: Secondary | ICD-10-CM

## 2017-02-01 LAB — COMPREHENSIVE METABOLIC PANEL
ALBUMIN: 4.1 g/dL (ref 3.5–5.2)
ALK PHOS: 56 U/L (ref 39–117)
ALT: 10 U/L (ref 0–35)
AST: 16 U/L (ref 0–37)
BILIRUBIN TOTAL: 0.6 mg/dL (ref 0.2–1.2)
BUN: 15 mg/dL (ref 6–23)
CO2: 29 mEq/L (ref 19–32)
CREATININE: 0.83 mg/dL (ref 0.40–1.20)
Calcium: 9.9 mg/dL (ref 8.4–10.5)
Chloride: 105 mEq/L (ref 96–112)
GFR: 75.81 mL/min (ref >60.00)
Glucose, Bld: 91 mg/dL (ref 70–99)
Potassium: 4.6 mEq/L (ref 3.5–5.1)
Sodium: 139 mEq/L (ref 135–145)
TOTAL PROTEIN: 6.7 g/dL (ref 6.0–8.3)

## 2017-02-01 LAB — CBC WITH DIFFERENTIAL/PLATELET
BASOS PCT: 0.3 % (ref 0.0–3.0)
Basophils Absolute: 0 10*3/uL (ref 0.0–0.1)
EOS ABS: 0.1 10*3/uL (ref 0.0–0.7)
Eosinophils Relative: 1.8 % (ref 0.0–5.0)
HEMATOCRIT: 40.3 % (ref 36.0–46.0)
Hemoglobin: 13.4 g/dL (ref 12.0–15.0)
LYMPHS ABS: 1.5 10*3/uL (ref 0.7–4.0)
LYMPHS PCT: 28.2 % (ref 12.0–46.0)
MCHC: 33.2 g/dL (ref 30.0–36.0)
MCV: 92.4 fl (ref 78.0–100.0)
Monocytes Absolute: 0.5 10*3/uL (ref 0.1–1.0)
Monocytes Relative: 8.8 % (ref 3.0–12.0)
Neutro Abs: 3.3 10*3/uL (ref 1.4–7.7)
Neutrophils Relative %: 60.9 % (ref 43.0–77.0)
Platelets: 205 10*3/uL (ref 150.0–400.0)
RBC: 4.36 Mil/uL (ref 3.87–5.11)
RDW: 13.5 % (ref 11.5–15.5)
WBC: 5.4 10*3/uL (ref 4.0–10.5)

## 2017-02-01 LAB — LIPID PANEL
CHOL/HDL RATIO: 3
Cholesterol: 186 mg/dL (ref 0–200)
HDL: 62.7 mg/dL (ref >39.00)
LDL Cholesterol: 107 mg/dL — ABNORMAL HIGH (ref 0–99)
NONHDL: 123.65
Triglycerides: 81 mg/dL (ref 0.0–149.0)
VLDL: 16.2 mg/dL (ref 0.0–40.0)

## 2017-02-01 LAB — TSH: TSH: 1.77 u[IU]/mL (ref 0.35–4.50)

## 2017-02-01 NOTE — Progress Notes (Signed)
Patient ID: Denise Arnold, female   DOB: Jun 27, 1961, 56 y.o.   MRN: [id number]   Subjective:    Patient ID: Denise Arnold, female    DOB: 1960/08/18, 56 y.o.   MRN: [id number]  HPI  Patient here for her physical exam.  She reports she is doing relatively well.  Still exercises regularly.  No chest pain.  No sob.  No acid reflux.  No abdominal pain.  Seeing Dr Jefm Bryant - for f/u scleroderma.  Stable.     Past Medical History:  Diagnosis Date  . Arthritis   . GERD (gastroesophageal reflux disease)   . History of endoscopy 04/05/2013   upper  . Menorrhagia   . 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 Scleroderma ([facility])    positive FANA, positive SCL-70 abs, raynauds, mild pulmonary hypertension, normal DLCO  . Tricuspid regurgitation   . Vitamin D deficiency    Past Surgical History:  Procedure Laterality Date  . CESAREAN SECTION  1994  . COLONOSCOPY WITH PROPOFOL N/A 05/29/2015   Procedure: COLONOSCOPY WITH PROPOFOL;  Surgeon: Manya Silvas, MD;  Location: [facility] ENDOSCOPY;  Service: Endoscopy;  Laterality: N/A;  . DILATION AND CURETTAGE OF UTERUS  1992  . DILATION AND CURETTAGE OF UTERUS  1993  . DILATION AND CURETTAGE OF UTERUS  2011  . VEIN SURGERY     Family History  Problem Relation Age of Onset  . Heart disease Father        myocardial infarction  . Arthritis/Rheumatoid Father   . Hypertension Father   . Hyperlipidemia Mother   . Breast cancer Maternal Grandmother   . Colon cancer Neg Hx    Social History   Social History  . Marital status: Married    Spouse name: N/A  . Number of children: 1  . Years of education: N/A   Social History Main Topics  . Smoking status: Never Smoker  . Smokeless tobacco: Never Used  . Alcohol use No  . Drug use: No  . Sexual activity: Not Asked   Other Topics Concern  . None   Social History Narrative  . None    Outpatient Encounter Prescriptions as of 02/01/2017  Medication Sig  . amLODipine (NORVASC) 5 MG tablet Take 5 mg by mouth 2  (two) times daily.   . Calcium-Magnesium-Vitamin D (CALCIUM 500 PO) Take by mouth.  . cholecalciferol (VITAMIN D) 1000 UNITS tablet Take 1,000 Units by mouth daily.  . fish oil-omega-3 fatty acids 1000 MG capsule Take 1 g by mouth daily.  Marland Kitchen glucosamine-chondroitin 500-400 MG tablet Take 1 tablet by mouth daily.  . Multiple Vitamin (MULTIVITAMIN) tablet Take 1 tablet by mouth daily.  . nitroGLYCERIN (NITROGLYN) 2 % ointment Apply 0.5 inches topically 3 (three) times daily as needed.   Marland Kitchen omeprazole (PRILOSEC) 20 MG capsule Take 20 mg by mouth 2 (two) times daily.   . predniSONE (DELTASONE) 50 MG tablet 1 tablet daily x 5 days.  . Probiotic Product (PROBIOTIC DAILY PO) Take by mouth.   No facility-administered encounter medications on file as of 02/01/2017.     Review of Systems  Constitutional: Negative for appetite change and unexpected weight change.  HENT: Negative for congestion and sinus pressure.   Eyes: Negative for pain and visual disturbance.  Respiratory: Negative for cough, chest tightness and shortness of breath.   Cardiovascular: Negative for chest pain, palpitations and leg swelling.  Gastrointestinal: Negative for abdominal pain, nausea and vomiting.  Genitourinary: Negative for difficulty urinating and dysuria.  Musculoskeletal: Negative for back pain and joint swelling.  Skin: Negative for color change and rash.  Neurological: Negative for dizziness, light-headedness and headaches.  Hematological: Negative for adenopathy. Does not bruise/bleed easily.  Psychiatric/Behavioral: Negative for agitation and dysphoric mood.       Objective:    Physical Exam  Constitutional: She is oriented to person, place, and time. She appears well-developed and well-nourished. No distress.  HENT:  Nose: Nose normal.  Mouth/Throat: Oropharynx is clear and moist.  Eyes: Right eye exhibits no discharge. Left eye exhibits no discharge. No scleral icterus.  Neck: Neck supple. No  thyromegaly present.  Cardiovascular: Normal rate and regular rhythm.   Pulmonary/Chest: Breath sounds normal. No accessory muscle usage. No tachypnea. No respiratory distress. She has no decreased breath sounds. She has no wheezes. She has no rhonchi. Right breast exhibits no inverted nipple, no mass, no nipple discharge and no tenderness (no axillary adenopathy). Left breast exhibits no inverted nipple, no mass, no nipple discharge and no tenderness (no axilarry adenopathy).  Abdominal: Soft. Bowel sounds are normal. There is no tenderness.  Genitourinary:  Genitourinary Comments: Normal external genitalia.  Vaginal vault without lesions.  Cervix identified.  Pap smear performed.  Could not appreciate any adnexal masses or tenderness.    Musculoskeletal: She exhibits no edema or tenderness.  Lymphadenopathy:    She has no cervical adenopathy.  Neurological: She is alert and oriented to person, place, and time.  Skin: Skin is warm. No rash noted. No erythema.  Psychiatric: She has a normal mood and affect. Her behavior is normal.    BP 130/66 (BP Location: Left Arm, Patient Position: Sitting, Cuff Size: Normal)   Pulse 67   Temp 97.8 F (36.6 C) (Oral)   Resp 12   Ht 5\' 4"  (1.626 m)   Wt 163 lb 3.2 oz (74 kg)   LMP 02/14/2010   SpO2 99%   BMI 28.01 kg/m  Wt Readings from Last 3 Encounters:  02/01/17 163 lb 3.2 oz (74 kg)  10/19/16 166 lb 2 oz (75.4 kg)  07/27/16 166 lb (75.3 kg)     Lab Results  Component Value Date   WBC 5.4 02/01/2017   HGB 13.4 02/01/2017   HCT 40.3 02/01/2017   PLT 205.0 02/01/2017   GLUCOSE 91 02/01/2017   CHOL 186 02/01/2017   TRIG 81.0 02/01/2017   HDL 62.70 02/01/2017   LDLCALC 107 (H) 02/01/2017   ALT 10 02/01/2017   AST 16 02/01/2017   NA 139 02/01/2017   K 4.6 02/01/2017   CL 105 02/01/2017   CREATININE 0.83 02/01/2017   BUN 15 02/01/2017   CO2 29 02/01/2017   TSH 1.77 02/01/2017    Mm Screening Breast Tomo Bilateral  Result Date:  03/02/2016 CLINICAL DATA:  Screening. EXAM: 2D DIGITAL SCREENING BILATERAL MAMMOGRAM WITH CAD AND ADJUNCT TOMO COMPARISON:  Previous exam(s). ACR Breast Density Category c: The breast tissue is heterogeneously dense, which may obscure small masses. FINDINGS: There are no findings suspicious for malignancy. Images were processed with CAD. IMPRESSION: No mammographic evidence of malignancy. A result letter of this screening mammogram will be mailed directly to the patient. RECOMMENDATION: Screening mammogram in one year. (Code:SM-B-01Y) BI-RADS CATEGORY  1: Negative. Electronically Signed   By: Pamelia Hoit M.D.   On: 03/02/2016 17:20       Assessment & Plan:   Problem List Items Addressed This Visit    Finger ulcer ([place])    Continue amlodipine.  Improved.  GERD (gastroesophageal reflux disease)    Controlled on prilosec.        Health care maintenance    Physical today 02/01/17.  PAP 01/20/15 - negative with negative HPV.  PAP 02/01/17.  Colonoscopy 03/2013.  Recommended f/u colonoscopy in 05/2015 - diminutive polyp, diverticulosis and internal hemorrhoids.         History of colonic polyps    Colonoscopy 05/2015.  Recommended f/u colonoscopy in 2021.        Raynauds phenomenon    On amlodipine.  Followed by Dr Jefm Bryant.        Scleroderma (HCC)    Stable.  Followed by Dr Jefm Bryant.        Relevant Orders   CBC with Differential/Platelet (Completed)   Comprehensive metabolic panel (Completed)   TSH (Completed)   Vitamin D deficiency    Continue vitamin D supplements.         Other Visit Diagnoses    Screening for breast cancer    -  Primary   Relevant Orders   MM DIGITAL SCREENING BILATERAL   Screening cholesterol level       Relevant Orders   Lipid panel (Completed)       Einar Pheasant, MD

## 2017-02-01 NOTE — Assessment & Plan Note (Addendum)
Physical today 02/01/17.  PAP 01/20/15 - negative with negative HPV.  PAP 02/01/17.  Colonoscopy 03/2013.  Recommended f/u colonoscopy in 05/2015 - diminutive polyp, diverticulosis and internal hemorrhoids.

## 2017-02-01 NOTE — Progress Notes (Signed)
Pre-visit discussion using our clinic review tool. No additional management support is needed unless otherwise documented below in the visit note.  

## 2017-02-02 ENCOUNTER — Other Ambulatory Visit (HOSPITAL_COMMUNITY)
Admission: RE | Admit: 2017-02-02 | Discharge: 2017-02-02 | Disposition: A | Discharge status: Home / Self Care | Payer: BC Managed Care – PPO | Source: Ambulatory Visit | Attending: Internal Medicine | Admitting: Internal Medicine

## 2017-02-02 ENCOUNTER — Encounter: Payer: Self-pay | Admitting: Internal Medicine

## 2017-02-02 NOTE — Addendum Note (Signed)
Addended by: Francella Solian on: 02/02/2017 08:59 AM   Modules accepted: Orders

## 2017-02-02 NOTE — Assessment & Plan Note (Signed)
Controlled on prilosec.   

## 2017-02-02 NOTE — Assessment & Plan Note (Signed)
Colonoscopy 05/2015.  Recommended f/u colonoscopy in 2021.

## 2017-02-02 NOTE — Assessment & Plan Note (Signed)
Continue amlodipine.  Improved.

## 2017-02-02 NOTE — Assessment & Plan Note (Signed)
Stable.  Followed by Dr Kernodle.   

## 2017-02-02 NOTE — Assessment & Plan Note (Signed)
Continue vitamin D supplements.  

## 2017-02-02 NOTE — Assessment & Plan Note (Signed)
On amlodipine.  Followed by Dr Kernodle.   

## 2017-02-06 LAB — CYTOLOGY - PAP
Diagnosis: NEGATIVE
HPV: NOT DETECTED

## 2017-02-07 ENCOUNTER — Encounter: Payer: Self-pay | Admitting: Internal Medicine

## 2017-03-03 ENCOUNTER — Ambulatory Visit
Admission: RE | Admit: 2017-03-03 | Discharge: 2017-03-03 | Disposition: A | Discharge status: Home / Self Care | Payer: BC Managed Care – PPO | Source: Ambulatory Visit | Attending: Internal Medicine | Admitting: Internal Medicine

## 2017-03-03 DIAGNOSIS — Z1231 Encounter for screening mammogram for malignant neoplasm of breast: Secondary | ICD-10-CM | POA: Diagnosis present

## 2017-03-03 DIAGNOSIS — Z1239 Encounter for other screening for malignant neoplasm of breast: Secondary | ICD-10-CM

## 2017-03-07 DIAGNOSIS — Z1283 Encounter for screening for malignant neoplasm of skin: Secondary | ICD-10-CM | POA: Diagnosis not present

## 2017-03-07 DIAGNOSIS — L814 Other melanin hyperpigmentation: Secondary | ICD-10-CM | POA: Diagnosis not present

## 2017-03-07 DIAGNOSIS — L570 Actinic keratosis: Secondary | ICD-10-CM | POA: Diagnosis not present

## 2017-03-07 DIAGNOSIS — L821 Other seborrheic keratosis: Secondary | ICD-10-CM | POA: Diagnosis not present

## 2017-03-08 DIAGNOSIS — M349 Systemic sclerosis, unspecified: Secondary | ICD-10-CM | POA: Diagnosis not present

## 2017-03-08 DIAGNOSIS — R0609 Other forms of dyspnea: Secondary | ICD-10-CM | POA: Diagnosis not present

## 2017-03-08 DIAGNOSIS — R05 Cough: Secondary | ICD-10-CM | POA: Diagnosis not present

## 2017-03-08 DIAGNOSIS — Z8719 Personal history of other diseases of the digestive system: Secondary | ICD-10-CM | POA: Diagnosis not present

## 2017-03-28 DIAGNOSIS — M349 Systemic sclerosis, unspecified: Secondary | ICD-10-CM | POA: Diagnosis not present

## 2017-03-28 DIAGNOSIS — R0609 Other forms of dyspnea: Secondary | ICD-10-CM | POA: Diagnosis not present

## 2017-08-08 DIAGNOSIS — M7062 Trochanteric bursitis, left hip: Secondary | ICD-10-CM | POA: Diagnosis not present

## 2017-08-08 DIAGNOSIS — M349 Systemic sclerosis, unspecified: Secondary | ICD-10-CM | POA: Diagnosis not present

## 2017-08-08 DIAGNOSIS — I7300 Raynaud's syndrome without gangrene: Secondary | ICD-10-CM | POA: Diagnosis not present

## 2017-08-09 ENCOUNTER — Ambulatory Visit (INDEPENDENT_AMBULATORY_CARE_PROVIDER_SITE_OTHER): Payer: BC Managed Care – PPO | Admitting: Internal Medicine

## 2017-08-09 ENCOUNTER — Encounter: Payer: Self-pay | Admitting: Internal Medicine

## 2017-08-09 DIAGNOSIS — M349 Systemic sclerosis, unspecified: Secondary | ICD-10-CM

## 2017-08-09 DIAGNOSIS — E559 Vitamin D deficiency, unspecified: Secondary | ICD-10-CM

## 2017-08-09 DIAGNOSIS — K2100 Gastro-esophageal reflux disease with esophagitis, without bleeding: Secondary | ICD-10-CM

## 2017-08-09 DIAGNOSIS — I7300 Raynaud's syndrome without gangrene: Secondary | ICD-10-CM | POA: Diagnosis not present

## 2017-08-09 DIAGNOSIS — L98499 Non-pressure chronic ulcer of skin of other sites with unspecified severity: Secondary | ICD-10-CM | POA: Diagnosis not present

## 2017-08-09 NOTE — Progress Notes (Signed)
Patient ID: RAYLEE STREHL, female   DOB: May 01, 1961, 57 y.o.   MRN: [id number]   Subjective:    Patient ID: Bing Matter Hessling, female    DOB: 20-Feb-1961, 57 y.o.   MRN: [id number]  HPI  Patient here for a scheduled follow up.  She reports she is doing well.  Sees Dr Jefm Bryant for her scleroderma and raynauds.  Some bilateral hip pain.  Stable.  Desires no further intervention.  Exercises.  No chest pain.  No sob.  No acid reflux.  No abdominal pain.  Bowels moving.     Past Medical History:  Diagnosis Date  . Arthritis   . GERD (gastroesophageal reflux disease)   . History of endoscopy 04/05/2013   upper  . Menorrhagia   . 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 Scleroderma (HCC)    positive FANA, positive SCL-70 abs, raynauds, mild pulmonary hypertension, normal DLCO  . Tricuspid regurgitation   . Vitamin D deficiency    Past Surgical History:  Procedure Laterality Date  . CESAREAN SECTION  1994  . COLONOSCOPY WITH PROPOFOL N/A 05/29/2015   Procedure: COLONOSCOPY WITH PROPOFOL;  Surgeon: Manya Silvas, MD;  Location: [facility] ENDOSCOPY;  Service: Endoscopy;  Laterality: N/A;  . DILATION AND CURETTAGE OF UTERUS  1992  . DILATION AND CURETTAGE OF UTERUS  1993  . DILATION AND CURETTAGE OF UTERUS  2011  . VEIN SURGERY     Family History  Problem Relation Age of Onset  . Heart disease Father        myocardial infarction  . Arthritis/Rheumatoid Father   . Hypertension Father   . Hyperlipidemia Mother   . Breast cancer Maternal Grandmother   . Colon cancer Neg Hx    Social History   Socioeconomic History  . Marital status: Married    Spouse name: None  . Number of children: 1  . Years of education: None  . Highest education level: None  Social Needs  . Financial resource strain: None  . Food insecurity - worry: None  . Food insecurity - inability: None  . Transportation needs - medical: None  . Transportation needs - non-medical: None  Occupational History  . None  Tobacco Use  .  Smoking status: Never Smoker  . Smokeless tobacco: Never Used  Substance and Sexual Activity  . Alcohol use: No    Alcohol/week: 0.0 oz  . Drug use: No  . Sexual activity: None  Other Topics Concern  . None  Social History Narrative  . None    Outpatient Encounter Medications as of 08/09/2017  Medication Sig  . amLODipine (NORVASC) 5 MG tablet Take 5 mg by mouth 2 (two) times daily.   . Calcium-Magnesium-Vitamin D (CALCIUM 500 PO) Take by mouth.  . cholecalciferol (VITAMIN D) 1000 UNITS tablet Take 1,000 Units by mouth daily.  . fish oil-omega-3 fatty acids 1000 MG capsule Take 1 g by mouth daily.  Marland Kitchen glucosamine-chondroitin 500-400 MG tablet Take 1 tablet by mouth daily.  Marland Kitchen MISC NATURAL PRODUCTS PO Herbal Supplement  . Multiple Vitamin (MULTIVITAMIN) tablet Take 1 tablet by mouth daily.  . nitroGLYCERIN (NITROGLYN) 2 % ointment Apply 0.5 inches topically 3 (three) times daily as needed.   Marland Kitchen omeprazole (PRILOSEC) 20 MG capsule Take 20 mg by mouth 2 (two) times daily.   . predniSONE (DELTASONE) 50 MG tablet 1 tablet daily x 5 days.  . Probiotic Product (PROBIOTIC DAILY PO) Take by mouth.  . Probiotic Product (PROBIOTIC PO) Take by mouth.  No facility-administered encounter medications on file as of 08/09/2017.     Review of Systems  Constitutional: Negative for appetite change and unexpected weight change.  HENT: Negative for congestion and sinus pressure.   Respiratory: Negative for cough, chest tightness and shortness of breath.   Cardiovascular: Negative for chest pain, palpitations and leg swelling.  Gastrointestinal: Negative for abdominal pain, diarrhea, nausea and vomiting.  Genitourinary: Negative for difficulty urinating and dysuria.  Musculoskeletal: Negative for myalgias.       Bilateral hip pain as outlined.  Stable.    Skin: Negative for color change and rash.  Neurological: Negative for dizziness, light-headedness and headaches.  Psychiatric/Behavioral: Negative  for agitation and dysphoric mood.       Objective:    Physical Exam  Constitutional: She appears well-developed and well-nourished. No distress.  HENT:  Nose: Nose normal.  Mouth/Throat: Oropharynx is clear and moist.  Neck: Neck supple. No thyromegaly present.  Cardiovascular: Normal rate and regular rhythm.  Pulmonary/Chest: Breath sounds normal. No respiratory distress. She has no wheezes.  Abdominal: Soft. Bowel sounds are normal. There is no tenderness.  Musculoskeletal: She exhibits no edema or tenderness.  Lymphadenopathy:    She has no cervical adenopathy.  Skin: No rash noted. No erythema.  Psychiatric: She has a normal mood and affect. Her behavior is normal.    BP 102/60 (BP Location: Left Arm, Patient Position: Sitting, Cuff Size: Normal)   Pulse 74   Temp 98.2 F (36.8 C) (Oral)   LMP 02/14/2010  Wt Readings from Last 3 Encounters:  02/01/17 163 lb 3.2 oz (74 kg)  10/19/16 166 lb 2 oz (75.4 kg)  07/27/16 166 lb (75.3 kg)     Lab Results  Component Value Date   WBC 5.4 02/01/2017   HGB 13.4 02/01/2017   HCT 40.3 02/01/2017   PLT 205.0 02/01/2017   GLUCOSE 91 02/01/2017   CHOL 186 02/01/2017   TRIG 81.0 02/01/2017   HDL 62.70 02/01/2017   LDLCALC 107 (H) 02/01/2017   ALT 10 02/01/2017   AST 16 02/01/2017   NA 139 02/01/2017   K 4.6 02/01/2017   CL 105 02/01/2017   CREATININE 0.83 02/01/2017   BUN 15 02/01/2017   CO2 29 02/01/2017   TSH 1.77 02/01/2017    Mm Screening Breast Tomo Bilateral  Result Date: 03/03/2017 CLINICAL DATA:  Screening. EXAM: 2D DIGITAL SCREENING BILATERAL MAMMOGRAM WITH CAD AND ADJUNCT TOMO COMPARISON:  Previous exam(s). ACR Breast Density Category b: There are scattered areas of fibroglandular density. FINDINGS: There are no findings suspicious for malignancy. Images were processed with CAD. IMPRESSION: No mammographic evidence of malignancy. A result letter of this screening mammogram will be mailed directly to the patient.  RECOMMENDATION: Screening mammogram in one year. (Code:SM-B-01Y) BI-RADS CATEGORY  1: Negative. Electronically Signed   By: Lajean Manes M.D.   On: 03/03/2017 12:25       Assessment & Plan:   Problem List Items Addressed This Visit    Finger ulcer ([place])    Better.  Continue on amlodipine.        GERD (gastroesophageal reflux disease)    Controlled on prilosec.        Relevant Medications   Probiotic Product (PROBIOTIC PO)   Raynauds phenomenon    On amlodipine.  Followed by Dr Jefm Bryant.  No active lesions.  Stable.       Scleroderma (HCC)    Stable.  Followed by Dr Jefm Bryant.  Vitamin D deficiency    Follow vitamin D level.           Einar Pheasant, MD

## 2017-08-12 ENCOUNTER — Encounter: Payer: Self-pay | Admitting: Internal Medicine

## 2017-08-12 NOTE — Assessment & Plan Note (Signed)
On amlodipine.  Followed by Dr Jefm Bryant.  No active lesions.  Stable.

## 2017-08-12 NOTE — Assessment & Plan Note (Signed)
Stable.  Followed by Dr Kernodle.   

## 2017-08-12 NOTE — Assessment & Plan Note (Signed)
Controlled on prilosec.   

## 2017-08-12 NOTE — Assessment & Plan Note (Signed)
Better.  Continue on amlodipine.

## 2017-08-12 NOTE — Assessment & Plan Note (Signed)
Follow vitamin D level.  

## 2017-08-29 DIAGNOSIS — M349 Systemic sclerosis, unspecified: Secondary | ICD-10-CM | POA: Diagnosis not present

## 2017-09-18 ENCOUNTER — Encounter: Payer: Self-pay | Admitting: *Deleted

## 2017-09-18 ENCOUNTER — Ambulatory Visit: Payer: Self-pay | Admitting: *Deleted

## 2017-09-18 NOTE — Telephone Encounter (Signed)
  Pt called with skipped beats that she is noticing more frequently. She states when she is seating or trying to sleep on her left side she notices it. She is denying other cardiac symptoms, or shortness of breath.  She would like an appointment, but only wants to see her pcp. No near appointment available with her pcp. She thinks she needs a stress test done. Will contact Dr. Vella Kohler or her cardiologist    Reason for Disposition . [1] Skipped or extra beat(s) AND [2] occurs < 4 times / minute  Answer Assessment - Initial Assessment Questions 1. DESCRIPTION: "Please describe your heart rate or heart beat that you are having" (e.g., fast/slow, regular/irregular, skipped or extra beats, "palpitations")     Skipped beats 2. ONSET: "When did it start?" (Minutes, hours or days)      Noticed more frequently since last week. 3. DURATION: "How long does it last" (e.g., seconds, minutes, hours)     Not sure 4. PATTERN "Does it come and go, or has it been constant since it started?"  "Does it get worse with exertion?"   "Are you feeling it now?"     Constant, just noticed more lately 5. TAP: "Using your hand, can you tap out what you are feeling on a chair or table in front of you, so that I can hear?" (Note: not all patients can do this)       n/a 6. HEART RATE: "Can you tell me your heart rate?" "How many beats in 15 seconds?"  (Note: not all patients can do this)       Usually 60ish 7. RECURRENT SYMPTOM: "Have you ever had this before?" If so, ask: "When was the last time?" and "What happened that time?"      Yes pretty much all the time 8. CAUSE: "What do you think is causing the palpitations?"     n/a 9. CARDIAC HISTORY: "Do you have any history of heart disease?" (e.g., heart attack, angina, bypass surgery, angioplasty, arrhythmia)      arrhythmia 10. OTHER SYMPTOMS: "Do you have any other symptoms?" (e.g., dizziness, chest pain, sweating, difficulty breathing)       Some dizziness last week  when she was exercising.  11. PREGNANCY: "Is there any chance you are pregnant?" "When was your last menstrual period?"       no  Protocols used: HEART RATE AND HEARTBEAT QUESTIONS-A-AH

## 2017-09-18 NOTE — Telephone Encounter (Signed)
  Reason for Disposition . [1] Skipped or extra beat(s) AND [2] occurs 4 or more times per minute  Answer Assessment - Initial Assessment Questions 1. DESCRIPTION: "Please describe your heart rate or heart beat that you are having" (e.g., fast/slow, regular/irregular, skipped or extra beats, "palpitations")     Skipped beats 2. ONSET: "When did it start?" (Minutes, hours or days)      Started noticing more last week 3. DURATION: "How long does it last" (e.g., seconds, minutes, hours)     Not sure 4. PATTERN "Does it come and go, or has it been constant since it started?"  "Does it get worse with exertion?"   "Are you feeling it now?"     Constantly more noticeable at times, worse with stress 5. TAP: "Using your hand, can you tap out what you are feeling on a chair or table in front of you, so that I can hear?" (Note: not all patients can do this)       no 6. HEART RATE: "Can you tell me your heart rate?" "How many beats in 15 seconds?"  (Note: not all patients can do this)       60's 7. RECURRENT SYMPTOM: "Have you ever had this before?" If so, ask: "When was the last time?" and "What happened that time?"      Yes most of the time 8. CAUSE: "What do you think is causing the palpitations?"     Not sure why she is noticing it more frequently 9. CARDIAC HISTORY: "Do you have any history of heart disease?" (e.g., heart attack, angina, bypass surgery, angioplasty, arrhythmia)      Mitral valve, arrhythmia 10. OTHER SYMPTOMS: "Do you have any other symptoms?" (e.g., dizziness, chest pain, sweating, difficulty breathing)       Dizziness once last week when exercising 11. PREGNANCY: "Is there any chance you are pregnant?" "When was your last menstrual period?"       no  Protocols used: HEART RATE AND HEARTBEAT QUESTIONS-A-AH

## 2017-09-18 NOTE — Telephone Encounter (Signed)
  Additional Information . Commented on: [1] Skipped or extra beat(s) AND [2] occurs 4 or more times per minute    error . Commented on: [1] Skipped or extra beat(s) AND [2] occurs < 4 times / minute    Pt is having skipped heart beats which she says seems to be noticing more frequently. Denies cardiac symptoms.  She thinks she needs a stress test done. Will contact Dr. Vella Kohler or her c ardiologist.  Protocols used: HEART RATE AND HEARTBEAT QUESTIONS-A-AH  This encounter was created in error - please disregard.

## 2017-09-22 DIAGNOSIS — I059 Rheumatic mitral valve disease, unspecified: Secondary | ICD-10-CM | POA: Diagnosis not present

## 2017-09-22 DIAGNOSIS — R002 Palpitations: Secondary | ICD-10-CM | POA: Diagnosis not present

## 2017-09-22 DIAGNOSIS — M349 Systemic sclerosis, unspecified: Secondary | ICD-10-CM | POA: Diagnosis not present

## 2017-10-16 DIAGNOSIS — I059 Rheumatic mitral valve disease, unspecified: Secondary | ICD-10-CM | POA: Diagnosis not present

## 2017-10-16 DIAGNOSIS — M349 Systemic sclerosis, unspecified: Secondary | ICD-10-CM | POA: Diagnosis not present

## 2017-10-16 DIAGNOSIS — R002 Palpitations: Secondary | ICD-10-CM | POA: Diagnosis not present

## 2017-11-23 ENCOUNTER — Encounter: Payer: Self-pay | Admitting: Internal Medicine

## 2017-11-24 ENCOUNTER — Telehealth: Payer: Self-pay | Admitting: *Deleted

## 2017-11-24 NOTE — Telephone Encounter (Signed)
Please call pt and confirm that she is not having any acute issues (I.e., fever, muscle aches, rash, etc).  If no and she just found a tick on her, then recommend monitoring for any of these symptoms or problems.  Will need to be evaluated if any problems.  Thanks

## 2017-11-24 NOTE — Telephone Encounter (Signed)
Patient is currently having no symptoms denies rash, fever . Chills or muscle aches. Patient was advised to monitor and to call office immediately if symptoms occur.Patient stated she had to use magnifying glass to see tick, advised patient to monitor and lets [place] know .

## 2017-11-24 NOTE — Telephone Encounter (Signed)
Left message for patient to return call to office. 

## 2017-11-24 NOTE — Telephone Encounter (Signed)
Patient calling back, call [phone]

## 2017-11-24 NOTE — Telephone Encounter (Signed)
Copied from [place] #[id number]. Topic: Quick Communication - Office Called Patient >> Nov 24, 2017  1:46 PM Synthia Innocent wrote: Reason for CRM: Returning call

## 2017-12-19 DIAGNOSIS — T07XXXA Unspecified multiple injuries, initial encounter: Secondary | ICD-10-CM | POA: Diagnosis not present

## 2017-12-19 DIAGNOSIS — L820 Inflamed seborrheic keratosis: Secondary | ICD-10-CM | POA: Diagnosis not present

## 2018-01-10 DIAGNOSIS — L0390 Cellulitis, unspecified: Secondary | ICD-10-CM | POA: Diagnosis not present

## 2018-01-11 IMAGING — MG MM DIGITAL SCREENING BILAT W/ TOMO W/ CAD
9 of 12 series · 9 of 28 positions shown · non-contrast
Comparison: Previous exam(s).

CLINICAL DATA: Screening.

EXAM:
2D DIGITAL SCREENING BILATERAL MAMMOGRAM WITH CAD AND ADJUNCT TOMO

[L MLO synth-2D]
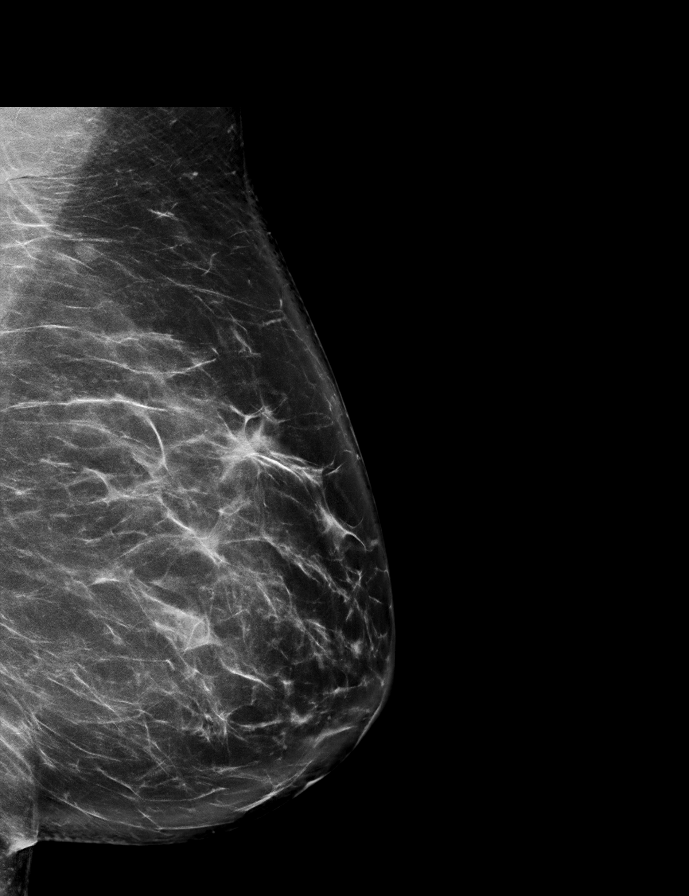

[L CC synth-2D]
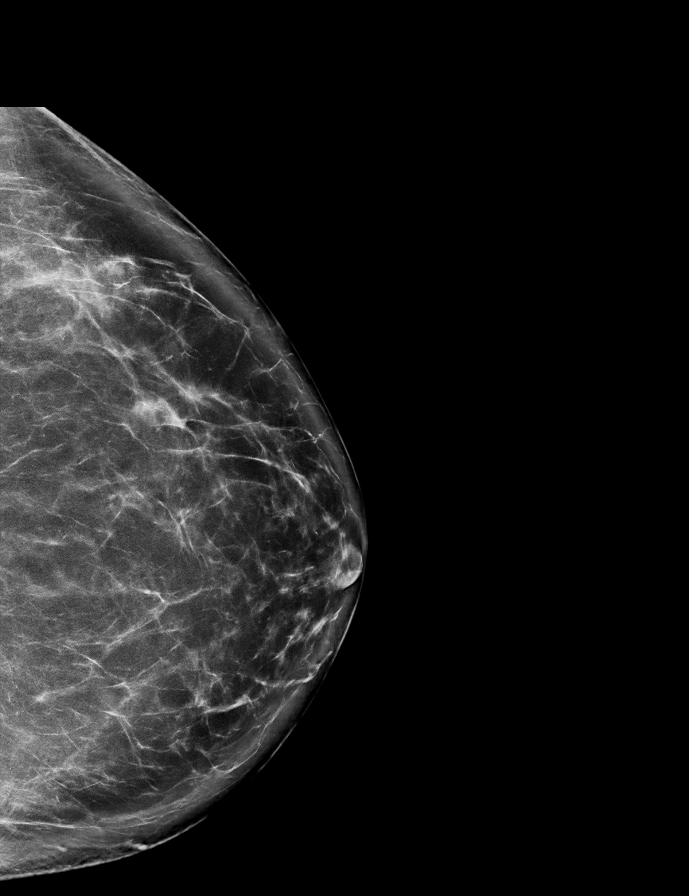

[R MLO synth-2D]
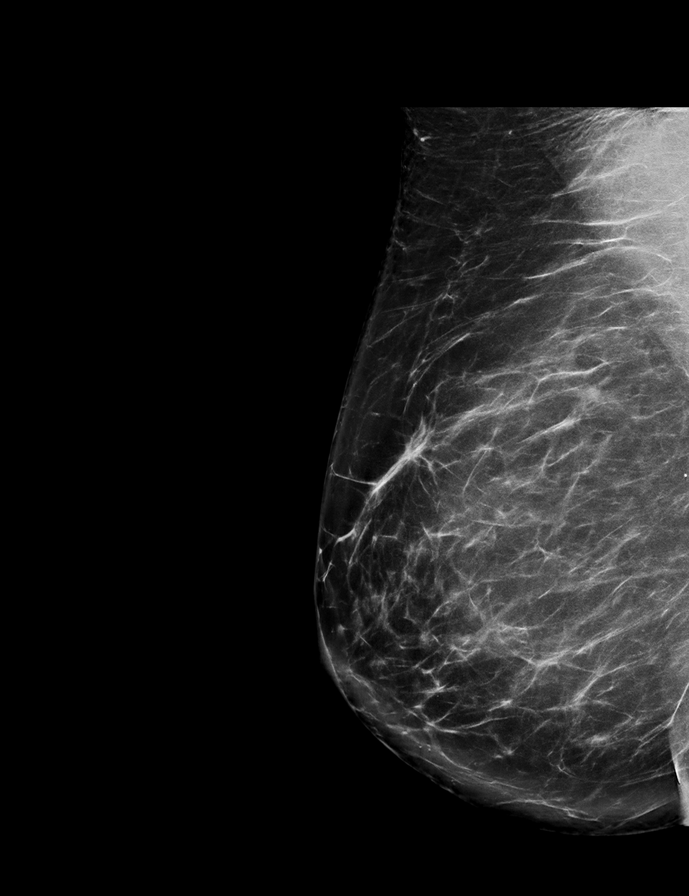

[L MLO]
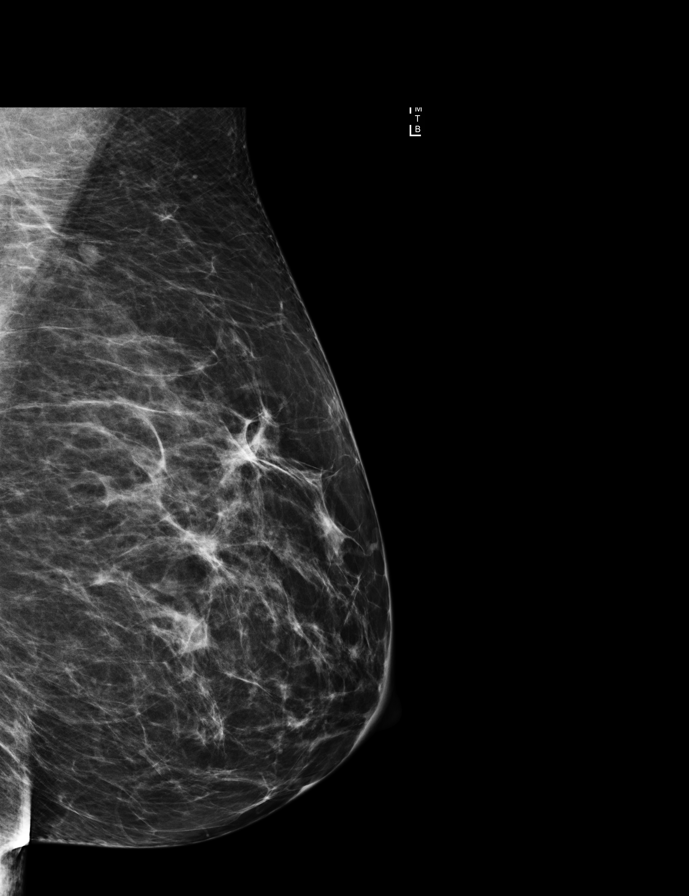

[R CC synth-2D]
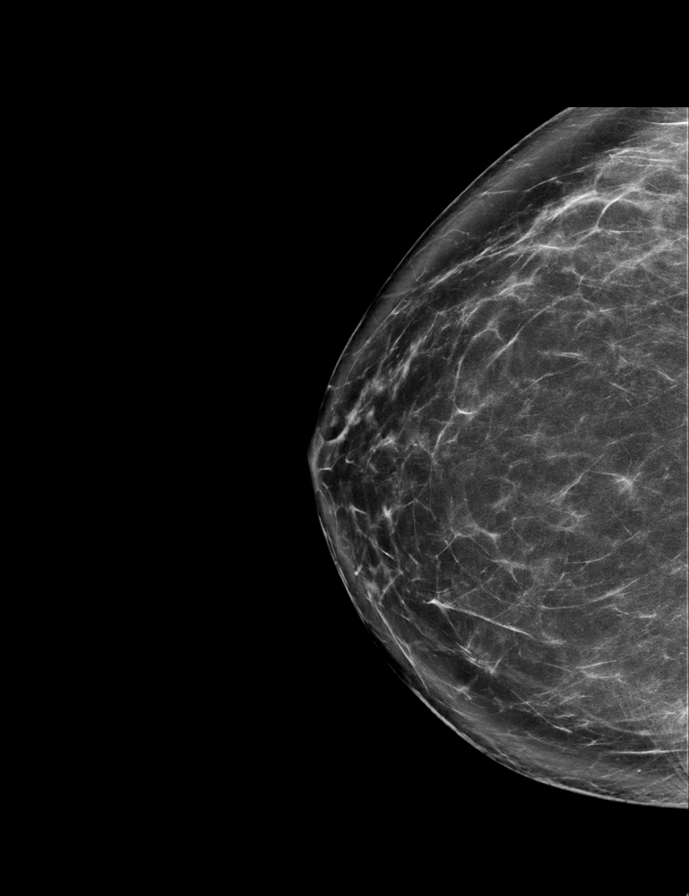

[R CC]
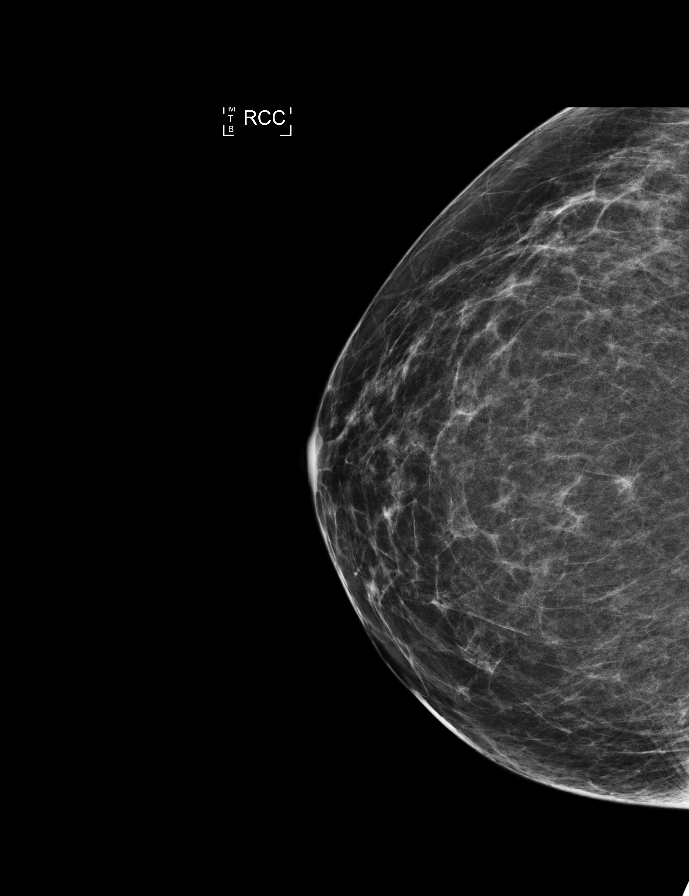

[R MLO]
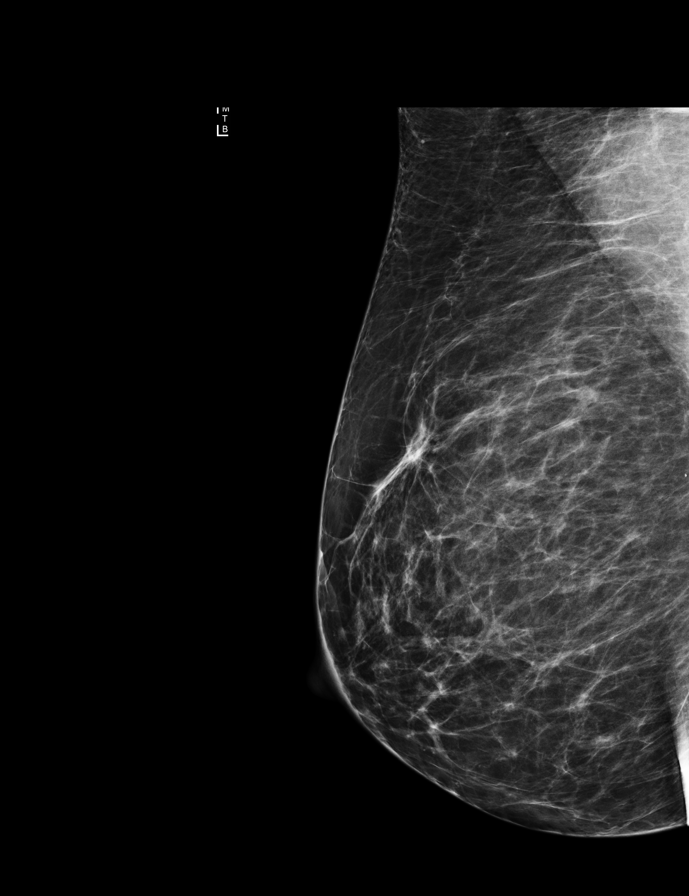

[L CC]
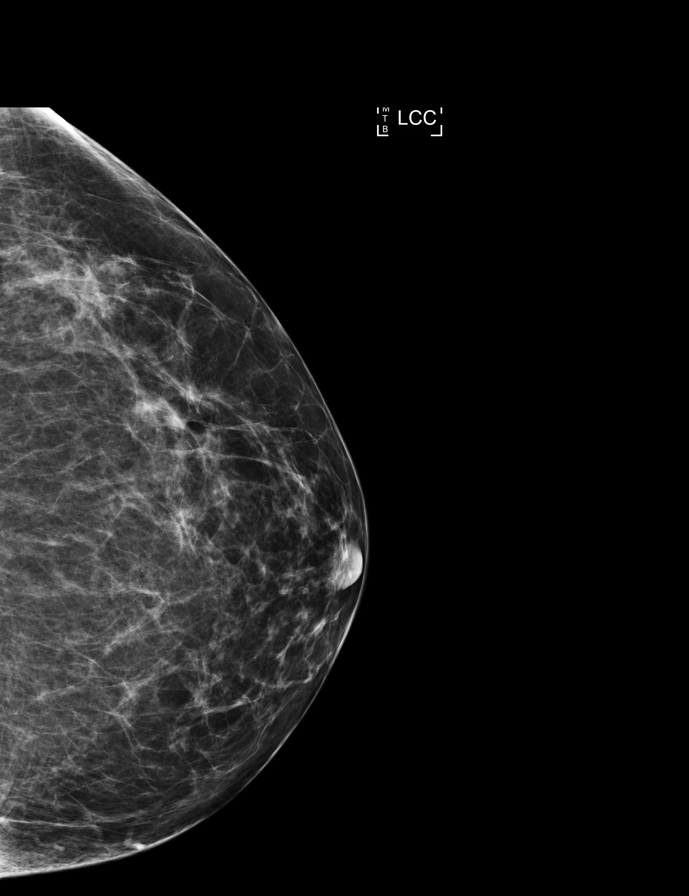

[R MLO tomo · tomo slice 48/95.0]
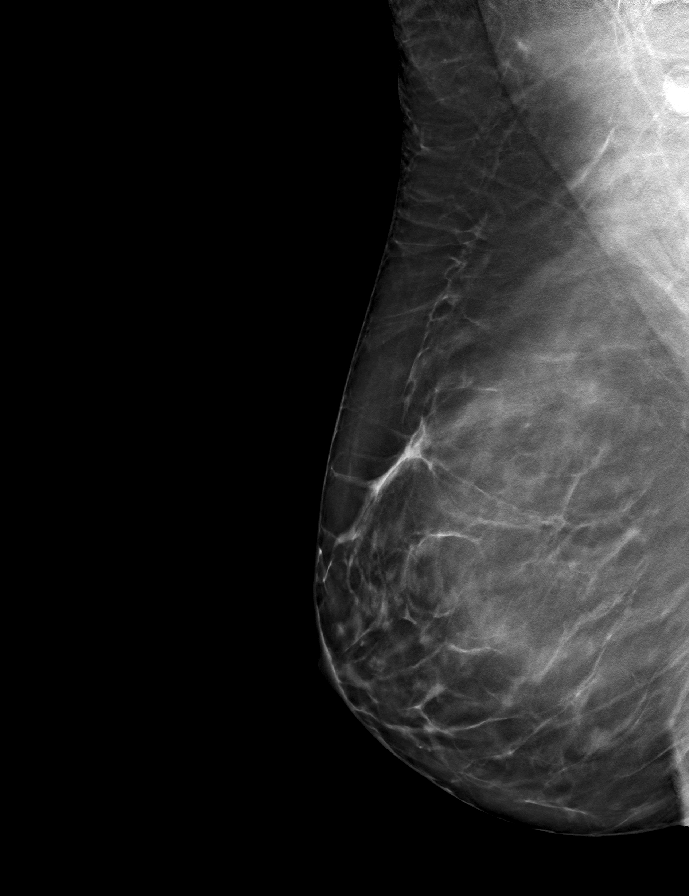

[9 of 28 positions shown; findings below may reference images not displayed]

ACR Breast Density Category b: There are scattered areas of
fibroglandular density.
FINDINGS: There are no findings suspicious for malignancy. Images were
processed with CAD.
IMPRESSION: No mammographic evidence of malignancy. A result letter of this
screening mammogram will be mailed directly to the patient.

RECOMMENDATION:
Screening mammogram in one year. (Code:97-6-RS4)

BI-RADS CATEGORY  1: Negative.

## 2018-01-12 DIAGNOSIS — Z9889 Other specified postprocedural states: Secondary | ICD-10-CM | POA: Diagnosis not present

## 2018-01-12 DIAGNOSIS — R2230 Localized swelling, mass and lump, unspecified upper limb: Secondary | ICD-10-CM | POA: Insufficient documentation

## 2018-01-12 DIAGNOSIS — R2231 Localized swelling, mass and lump, right upper limb: Secondary | ICD-10-CM | POA: Diagnosis not present

## 2018-01-14 ENCOUNTER — Encounter: Payer: Self-pay | Admitting: Internal Medicine

## 2018-01-15 ENCOUNTER — Ambulatory Visit (INDEPENDENT_AMBULATORY_CARE_PROVIDER_SITE_OTHER): Payer: BC Managed Care – PPO | Admitting: Internal Medicine

## 2018-01-15 DIAGNOSIS — L03113 Cellulitis of right upper limb: Secondary | ICD-10-CM

## 2018-01-15 DIAGNOSIS — M349 Systemic sclerosis, unspecified: Secondary | ICD-10-CM | POA: Diagnosis not present

## 2018-01-15 MED ORDER — DOXYCYCLINE HYCLATE 100 MG PO TABS: 100.0000 mg | ORAL_TABLET | Freq: Two times a day (BID) | ORAL | 0 refills | Status: DC

## 2018-01-15 NOTE — Telephone Encounter (Signed)
I have scheduled patient with you at 4:00 today.

## 2018-01-15 NOTE — Progress Notes (Signed)
Patient ID: Denise Arnold, female   DOB: 06/23/1961, 57 y.o.   MRN: [id number]   Subjective:    Patient ID: Denise Arnold, female    DOB: June 30, 1961, 57 y.o.   MRN: [id number]  HPI  Patient here as a work in with concerns regarding persistent right arm lesion.   She was at the beach last week.  Noticed what looked like a boil on her right arm.  Persistent.  To an urgent care.  Diagnosed with cellulitis.  Given rocephin.  Returned two days later.  Able to express some pus from the lesion.  Back to urgent care.  S/p incision and she reported a "kernel" removed from center of lesion.  Placed on keflex.  States the surrounding redness has improved, but now has a firm red area above the lesion.  Increased tenderness.  No fever.  Eating.  No diarrhea.  Some minimal nausea since starting keflex.     Past Medical History:  Diagnosis Date  . Arthritis   . GERD (gastroesophageal reflux disease)   . History of endoscopy 04/05/2013   upper  . Menorrhagia   . 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 Scleroderma (HCC)    positive FANA, positive SCL-70 abs, raynauds, mild pulmonary hypertension, normal DLCO  . Tricuspid regurgitation   . Vitamin D deficiency    Past Surgical History:  Procedure Laterality Date  . CESAREAN SECTION  1994  . COLONOSCOPY WITH PROPOFOL N/A 05/29/2015   Procedure: COLONOSCOPY WITH PROPOFOL;  Surgeon: Manya Silvas, MD;  Location: [facility] ENDOSCOPY;  Service: Endoscopy;  Laterality: N/A;  . DILATION AND CURETTAGE OF UTERUS  1992  . DILATION AND CURETTAGE OF UTERUS  1993  . DILATION AND CURETTAGE OF UTERUS  2011  . VEIN SURGERY     Family History  Problem Relation Age of Onset  . Heart disease Father        myocardial infarction  . Arthritis/Rheumatoid Father   . Hypertension Father   . Hyperlipidemia Mother   . Breast cancer Maternal Grandmother   . Colon cancer Neg Hx    Social History   Socioeconomic History  . Marital status: Married    Spouse name: Not on file  . Number  of children: 1  . Years of education: Not on file  . Highest education level: Not on file  Occupational History  . Not on file  Social Needs  . Financial resource strain: Not on file  . Food insecurity:    Worry: Not on file    Inability: Not on file  . Transportation needs:    Medical: Not on file    Non-medical: Not on file  Tobacco Use  . Smoking status: Never Smoker  . Smokeless tobacco: Never Used  Substance and Sexual Activity  . Alcohol use: No    Alcohol/week: 0.0 oz  . Drug use: No  . Sexual activity: Not on file  Lifestyle  . Physical activity:    Days per week: Not on file    Minutes per session: Not on file  . Stress: Not on file  Relationships  . Social connections:    Talks on phone: Not on file    Gets together: Not on file    Attends religious service: Not on file    Active member of club or organization: Not on file    Attends meetings of clubs or organizations: Not on file    Relationship status: Not on file  Other Topics Concern  .  Not on file  Social History Narrative  . Not on file    Outpatient Encounter Medications as of 01/15/2018  Medication Sig  . amLODipine (NORVASC) 10 MG tablet Take 10 mg by mouth daily.  Marland Kitchen amLODipine (NORVASC) 5 MG tablet Take 5 mg by mouth 2 (two) times daily.   . Calcium-Magnesium-Vitamin D (CALCIUM 500 PO) Take by mouth.  . cholecalciferol (VITAMIN D) 1000 UNITS tablet Take 1,000 Units by mouth daily.  Marland Kitchen doxycycline (VIBRA-TABS) 100 MG tablet Take 1 tablet (100 mg total) by mouth 2 (two) times daily.  . fish oil-omega-3 fatty acids 1000 MG capsule Take 1 g by mouth daily.  Marland Kitchen glucosamine-chondroitin 500-400 MG tablet Take 1 tablet by mouth daily.  Marland Kitchen MISC NATURAL PRODUCTS PO Herbal Supplement  . Multiple Vitamin (MULTIVITAMIN) tablet Take 1 tablet by mouth daily.  . nitroGLYCERIN (NITROGLYN) 2 % ointment Apply 0.5 inches topically 3 (three) times daily as needed.   Marland Kitchen omeprazole (PRILOSEC) 20 MG capsule Take 20 mg by  mouth 2 (two) times daily.   . Probiotic Product (PROBIOTIC PO) Take by mouth.  . [DISCONTINUED] predniSONE (DELTASONE) 50 MG tablet 1 tablet daily x 5 days.  . [DISCONTINUED] Probiotic Product (PROBIOTIC DAILY PO) Take by mouth.   No facility-administered encounter medications on file as of 01/15/2018.     Review of Systems  Constitutional: Negative for appetite change and fever.  Respiratory: Negative for cough and shortness of breath.   Gastrointestinal: Negative for diarrhea and vomiting.       Minimal nausea.   Musculoskeletal: Negative for joint swelling and myalgias.  Skin:       Lesion right arm with surrounding redness and tenderness.   Psychiatric/Behavioral: Negative for agitation and dysphoric mood.       Objective:    Physical Exam  Constitutional: She appears well-developed and well-nourished. No distress.  Neck: Neck supple.  Cardiovascular: Normal rate and regular rhythm.  Pulmonary/Chest: Breath sounds normal. No respiratory distress. She has no wheezes.  Musculoskeletal: She exhibits no edema.  Lymphadenopathy:    She has no cervical adenopathy.  Skin:  Small lesion right forearm with surrounding erythema and induration.  Increased firm tender area above the lesion.    Psychiatric: She has a normal mood and affect. Her behavior is normal.    BP 122/70 (BP Location: Left Arm, Patient Position: Sitting, Cuff Size: Normal)   Pulse 70   Temp 97.8 F (36.6 C) (Oral)   Resp 16   Wt 166 lb 3.2 oz (75.4 kg)   LMP 02/14/2010   SpO2 99%   BMI 28.53 kg/m  Wt Readings from Last 3 Encounters:  01/15/18 166 lb 3.2 oz (75.4 kg)  02/01/17 163 lb 3.2 oz (74 kg)  10/19/16 166 lb 2 oz (75.4 kg)     Lab Results  Component Value Date   WBC 5.4 02/01/2017   HGB 13.4 02/01/2017   HCT 40.3 02/01/2017   PLT 205.0 02/01/2017   GLUCOSE 91 02/01/2017   CHOL 186 02/01/2017   TRIG 81.0 02/01/2017   HDL 62.70 02/01/2017   LDLCALC 107 (H) 02/01/2017   ALT 10 02/01/2017     AST 16 02/01/2017   NA 139 02/01/2017   K 4.6 02/01/2017   CL 105 02/01/2017   CREATININE 0.83 02/01/2017   BUN 15 02/01/2017   CO2 29 02/01/2017   TSH 1.77 02/01/2017    Mm Screening Breast Tomo Bilateral  Result Date: 03/03/2017 CLINICAL DATA:  Screening. EXAM: 2D DIGITAL  SCREENING BILATERAL MAMMOGRAM WITH CAD AND ADJUNCT TOMO COMPARISON:  Previous exam(s). ACR Breast Density Category b: There are scattered areas of fibroglandular density. FINDINGS: There are no findings suspicious for malignancy. Images were processed with CAD. IMPRESSION: No mammographic evidence of malignancy. A result letter of this screening mammogram will be mailed directly to the patient. RECOMMENDATION: Screening mammogram in one year. (Code:SM-B-01Y) BI-RADS CATEGORY  1: Negative. Electronically Signed   By: Lajean Manes M.D.   On: 03/03/2017 12:25       Assessment & Plan:   Problem List Items Addressed This Visit    Cellulitis    Recently evaluated at urgent care.  Diagnosed with cellulitis.  On keflex.  S/p I & D.  Some improvement with the surrounding erythema, but since the I&D, she has noticed increased redness and firm area just above the opening.  Tender to touch.  Discussed applying warm compresses.  Star doxycycline.  Call with update.  May need surgery to evaluate.        Relevant Orders   Ambulatory referral to General Surgery   Scleroderma [facility])    Has been stable.  Followed by Dr Jefm Bryant.            Einar Pheasant, MD

## 2018-01-16 ENCOUNTER — Encounter: Payer: Self-pay | Admitting: Internal Medicine

## 2018-01-16 ENCOUNTER — Telehealth: Payer: Self-pay | Admitting: Internal Medicine

## 2018-01-16 DIAGNOSIS — L0390 Cellulitis, unspecified: Secondary | ICD-10-CM | POA: Insufficient documentation

## 2018-01-16 NOTE — Assessment & Plan Note (Signed)
Recently evaluated at urgent care.  Diagnosed with cellulitis.  On keflex.  S/p I & D.  Some improvement with the surrounding erythema, but since the I&D, she has noticed increased redness and firm area just above the opening.  Tender to touch.  Discussed applying warm compresses.  Star doxycycline.  Call with update.  May need surgery to evaluate.

## 2018-01-16 NOTE — Assessment & Plan Note (Signed)
Has been stable.  Followed by Dr Jefm Bryant.

## 2018-01-16 NOTE — Telephone Encounter (Signed)
My chart message sent to pt for update on arm.

## 2018-01-17 ENCOUNTER — Encounter: Payer: Self-pay | Admitting: General Surgery

## 2018-01-19 ENCOUNTER — Other Ambulatory Visit: Payer: Self-pay | Admitting: Internal Medicine

## 2018-01-19 DIAGNOSIS — Z1231 Encounter for screening mammogram for malignant neoplasm of breast: Secondary | ICD-10-CM

## 2018-01-25 ENCOUNTER — Encounter: Payer: Self-pay | Admitting: General Surgery

## 2018-01-25 ENCOUNTER — Ambulatory Visit (INDEPENDENT_AMBULATORY_CARE_PROVIDER_SITE_OTHER): Payer: BC Managed Care – PPO | Admitting: General Surgery

## 2018-01-25 VITALS — BP 110/62 | HR 73 | Resp 12 | Ht 64.0 in | Wt 165.0 lb

## 2018-01-25 DIAGNOSIS — L03113 Cellulitis of right upper limb: Secondary | ICD-10-CM

## 2018-01-25 NOTE — Progress Notes (Signed)
Patient ID: Denise Arnold, female   DOB: September 09, 1960, 57 y.o.   MRN: [id number]  Chief Complaint  Patient presents with  . Other    cellulitis    HPI Denise Arnold is a 57 y.o. female.  Here for evaluation of cellulitis on her right forearm referred by Dr Nicki Reaper. She noticed a red bump on Monday 01-08-18 while at the beach " like a bite". She states it got worse and when she saw the physician at the beach she initially received a antibiotic injection. She states the physician at the beach placed her on keflex, she took it for 3 days but didn't feel "right" while taking it so she stopped. She states they opened the area up while at the beach as well. She has used heat to the area. She states there is a hard area left and was concerned. She completed the doxycycline on Monday. Last seen by Dr Jamal Collin in July 2016.  HPI  Past Medical History:  Diagnosis Date  . Arthritis   . GERD (gastroesophageal reflux disease)   . History of endoscopy 04/05/2013   upper  . Menorrhagia   . 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 Scleroderma (HCC)    positive FANA, positive SCL-70 abs, raynauds, mild pulmonary hypertension, normal DLCO  . Tricuspid regurgitation   . Vitamin D deficiency     Past Surgical History:  Procedure Laterality Date  . CESAREAN SECTION  1994  . COLONOSCOPY WITH PROPOFOL N/A 05/29/2015   Procedure: COLONOSCOPY WITH PROPOFOL;  Surgeon: Manya Silvas, MD;  Location: [facility] ENDOSCOPY;  Service: Endoscopy;  Laterality: N/A;  . DILATION AND CURETTAGE OF UTERUS  1992  . DILATION AND CURETTAGE OF UTERUS  1993  . DILATION AND CURETTAGE OF UTERUS  2011  . VEIN SURGERY      Family History  Problem Relation Age of Onset  . Heart disease Father        myocardial infarction  . Arthritis/Rheumatoid Father   . Hypertension Father   . Hyperlipidemia Mother   . Breast cancer Maternal Grandmother   . Colon cancer Neg Hx     Social History Social History   Tobacco Use  . Smoking status: Never  Smoker  . Smokeless tobacco: Never Used  Substance Use Topics  . Alcohol use: No    Alcohol/week: 0.0 oz  . Drug use: No    Allergies  Allergen Reactions  . Sulfa Antibiotics Nausea And Vomiting  . Penicillins Rash    Current Outpatient Medications  Medication Sig Dispense Refill  . amLODipine (NORVASC) 10 MG tablet Take 10 mg by mouth daily.  1  . Calcium-Magnesium-Vitamin D (CALCIUM 500 PO) Take by mouth.    . cholecalciferol (VITAMIN D) 1000 UNITS tablet Take 1,000 Units by mouth daily.    . fish oil-omega-3 fatty acids 1000 MG capsule Take 1 g by mouth daily.    Marland Kitchen glucosamine-chondroitin 500-400 MG tablet Take 1 tablet by mouth daily.    Marland Kitchen MISC NATURAL PRODUCTS PO Herbal Supplement    . Multiple Vitamin (MULTIVITAMIN) tablet Take 1 tablet by mouth daily.    . nitroGLYCERIN (NITROGLYN) 2 % ointment Apply 0.5 inches topically 3 (three) times daily as needed.     Marland Kitchen omeprazole (PRILOSEC) 20 MG capsule Take 20 mg by mouth 2 (two) times daily.     . Probiotic Product (PROBIOTIC PO) Take by mouth.     No current facility-administered medications for this visit.     Review of  Systems Review of Systems  Constitutional: Negative.   Respiratory: Negative.   Cardiovascular: Negative.     Blood pressure 110/62, pulse 73, resp. rate 12, height 5\' 4"  (1.626 m), weight 165 lb (74.8 kg), last menstrual period 02/14/2010.  Physical Exam Physical Exam  Constitutional: She is oriented to person, place, and time. She appears well-developed and well-nourished.  Musculoskeletal:       Arms: Lymphadenopathy:    She has no axillary adenopathy.       Right axillary: No pectoral adenopathy present.  Neurological: She is alert and oriented to person, place, and time.  Skin: Skin is warm and dry.  Psychiatric: Her behavior is normal.    Data Reviewed PCP records. Phone images provided by patient.  Assessment    Local cellulitis secondary to insect bite, resolving.    Plan    No  indications for operative intervention at this time.  Residual thickening should resolve with time, made resolved more quickly with local heat application. The patient is aware to use a heating pad as needed for comfort. The patient is aware to call back for any questions or new concerns Follow up as needed. ]     HPI, Physical Exam, Assessment and Plan have been scribed under the direction and in the presence of Robert Bellow, MD. Karie Fetch, RN  I have completed the exam and reviewed the above documentation for accuracy and completeness.  I agree with the above.  Haematologist has been used and any errors in dictation or transcription are unintentional.  Hervey Ard, M.D., F.A.C.S.  Denise Arnold 01/25/2018, 9:13 AM

## 2018-01-25 NOTE — Patient Instructions (Signed)
The patient is aware to use a heating pad as needed for comfort. The patient is aware to call back for any questions or concerns.  

## 2018-02-05 DIAGNOSIS — M349 Systemic sclerosis, unspecified: Secondary | ICD-10-CM | POA: Diagnosis not present

## 2018-02-05 DIAGNOSIS — I7300 Raynaud's syndrome without gangrene: Secondary | ICD-10-CM | POA: Diagnosis not present

## 2018-02-05 DIAGNOSIS — M7062 Trochanteric bursitis, left hip: Secondary | ICD-10-CM | POA: Diagnosis not present

## 2018-02-06 ENCOUNTER — Ambulatory Visit (INDEPENDENT_AMBULATORY_CARE_PROVIDER_SITE_OTHER): Payer: BC Managed Care – PPO | Admitting: Internal Medicine

## 2018-02-06 ENCOUNTER — Encounter: Payer: Self-pay | Admitting: Internal Medicine

## 2018-02-06 VITALS — BP 102/68 | HR 69 | Temp 97.5°F | Resp 18 | Ht 64.0 in | Wt 166.0 lb

## 2018-02-06 DIAGNOSIS — Z86010 Personal history of colonic polyps: Secondary | ICD-10-CM

## 2018-02-06 DIAGNOSIS — K2100 Gastro-esophageal reflux disease with esophagitis, without bleeding: Secondary | ICD-10-CM

## 2018-02-06 DIAGNOSIS — Z0000 Encounter for general adult medical examination without abnormal findings: Secondary | ICD-10-CM | POA: Diagnosis not present

## 2018-02-06 DIAGNOSIS — L98499 Non-pressure chronic ulcer of skin of other sites with unspecified severity: Secondary | ICD-10-CM

## 2018-02-06 DIAGNOSIS — E559 Vitamin D deficiency, unspecified: Secondary | ICD-10-CM

## 2018-02-06 DIAGNOSIS — M349 Systemic sclerosis, unspecified: Secondary | ICD-10-CM

## 2018-02-06 NOTE — Progress Notes (Signed)
Patient ID: ORIE CUTTINO, female   DOB: 01-05-1961, 57 y.o.   MRN: [id number]   Subjective:    Patient ID: Bing Matter Hurrell, female    DOB: 16-Jun-1961, 57 y.o.   MRN: [id number]  HPI  Patient here for her physical exam.  Saw Dr Jefm Bryant 02/05/18 for f/u scleroderma.  Felt stable.  Recommended continuing amlodipine and PPI.  Recommended f/u in 6 months.  She has noticed occasionally having problems with pills that are not coated.  No problems swallowing food or liquids.  Stays active.  Exercises regularly.  No chest pain.  No sob.  No acid relfux.  No abdominal pain.  Bowels moving.  No urine change.  Discussed labs.  Discussed continued diet and exercise.     Past Medical History:  Diagnosis Date  . Arthritis   . GERD (gastroesophageal reflux disease)   . History of endoscopy 04/05/2013   upper  . Menorrhagia   . 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 Scleroderma (HCC)    positive FANA, positive SCL-70 abs, raynauds, mild pulmonary hypertension, normal DLCO  . Tricuspid regurgitation   . Vitamin D deficiency    Past Surgical History:  Procedure Laterality Date  . CESAREAN SECTION  1994  . COLONOSCOPY WITH PROPOFOL N/A 05/29/2015   Procedure: COLONOSCOPY WITH PROPOFOL;  Surgeon: Manya Silvas, MD;  Location: [facility] ENDOSCOPY;  Service: Endoscopy;  Laterality: N/A;  . DILATION AND CURETTAGE OF UTERUS  1992  . DILATION AND CURETTAGE OF UTERUS  1993  . DILATION AND CURETTAGE OF UTERUS  2011  . VEIN SURGERY     Family History  Problem Relation Age of Onset  . Heart disease Father        myocardial infarction  . Arthritis/Rheumatoid Father   . Hypertension Father   . Hyperlipidemia Mother   . Breast cancer Maternal Grandmother   . Colon cancer Neg Hx    Social History   Socioeconomic History  . Marital status: Married    Spouse name: Not on file  . Number of children: 1  . Years of education: Not on file  . Highest education level: Not on file  Occupational History  . Not on file    Social Needs  . Financial resource strain: Not on file  . Food insecurity:    Worry: Not on file    Inability: Not on file  . Transportation needs:    Medical: Not on file    Non-medical: Not on file  Tobacco Use  . Smoking status: Never Smoker  . Smokeless tobacco: Never Used  Substance and Sexual Activity  . Alcohol use: No    Alcohol/week: 0.0 oz  . Drug use: No  . Sexual activity: Not on file  Lifestyle  . Physical activity:    Days per week: Not on file    Minutes per session: Not on file  . Stress: Not on file  Relationships  . Social connections:    Talks on phone: Not on file    Gets together: Not on file    Attends religious service: Not on file    Active member of club or organization: Not on file    Attends meetings of clubs or organizations: Not on file    Relationship status: Not on file  Other Topics Concern  . Not on file  Social History Narrative  . Not on file    Outpatient Encounter Medications as of 02/06/2018  Medication Sig  . amLODipine (NORVASC) 10 MG  tablet Take 10 mg by mouth daily.  . Calcium-Magnesium-Vitamin D (CALCIUM 500 PO) Take by mouth.  . cholecalciferol (VITAMIN D) 1000 UNITS tablet Take 1,000 Units by mouth daily.  . fish oil-omega-3 fatty acids 1000 MG capsule Take 1 g by mouth daily.  Marland Kitchen glucosamine-chondroitin 500-400 MG tablet Take 1 tablet by mouth daily.  Marland Kitchen MISC NATURAL PRODUCTS PO Herbal Supplement  . Multiple Vitamin (MULTIVITAMIN) tablet Take 1 tablet by mouth daily.  . nitroGLYCERIN (NITROGLYN) 2 % ointment Apply 0.5 inches topically 3 (three) times daily as needed.   Marland Kitchen omeprazole (PRILOSEC) 20 MG capsule Take 20 mg by mouth 2 (two) times daily.   . Probiotic Product (PROBIOTIC PO) Take by mouth.   No facility-administered encounter medications on file as of 02/06/2018.     Review of Systems  Constitutional: Negative for appetite change and unexpected weight change.  HENT: Negative for congestion and sinus pressure.    Eyes: Negative for pain and visual disturbance.  Respiratory: Negative for cough, chest tightness and shortness of breath.   Cardiovascular: Negative for chest pain, palpitations and leg swelling.  Gastrointestinal: Negative for abdominal pain, diarrhea, nausea and vomiting.  Genitourinary: Negative for difficulty urinating and dysuria.  Musculoskeletal: Negative for joint swelling and myalgias.  Skin: Negative for color change and rash.  Neurological: Negative for dizziness, light-headedness and headaches.  Hematological: Negative for adenopathy. Does not bruise/bleed easily.  Psychiatric/Behavioral: Negative for agitation and dysphoric mood.       Objective:     Blood pressure rechecked by me:  110/68  Physical Exam  Constitutional: She is oriented to person, place, and time. She appears well-developed and well-nourished. No distress.  HENT:  Nose: Nose normal.  Mouth/Throat: Oropharynx is clear and moist.  Eyes: Right eye exhibits no discharge. Left eye exhibits no discharge. No scleral icterus.  Neck: Neck supple. No thyromegaly present.  Cardiovascular: Normal rate and regular rhythm.  Pulmonary/Chest: Breath sounds normal. No accessory muscle usage. No tachypnea. No respiratory distress. She has no decreased breath sounds. She has no wheezes. She has no rhonchi. Right breast exhibits no inverted nipple, no mass, no nipple discharge and no tenderness (no axillary adenopathy). Left breast exhibits no inverted nipple, no mass, no nipple discharge and no tenderness (no axilarry adenopathy).  Abdominal: Soft. Bowel sounds are normal. There is no tenderness.  Musculoskeletal: She exhibits no edema or tenderness.  Lymphadenopathy:    She has no cervical adenopathy.  Neurological: She is alert and oriented to person, place, and time.  Skin: No rash noted. No erythema.  Psychiatric: She has a normal mood and affect. Her behavior is normal.    BP 102/68 (BP Location: Left Arm,  Patient Position: Sitting, Cuff Size: Normal)   Pulse 69   Temp (!) 97.5 F (36.4 C) (Oral)   Resp 18   Ht 5\' 4"  (1.626 m)   Wt 166 lb (75.3 kg)   LMP 02/14/2010   SpO2 99%   BMI 28.49 kg/m  Wt Readings from Last 3 Encounters:  02/06/18 166 lb (75.3 kg)  01/25/18 165 lb (74.8 kg)  01/15/18 166 lb 3.2 oz (75.4 kg)     Lab Results  Component Value Date   WBC 5.4 02/01/2017   HGB 13.4 02/01/2017   HCT 40.3 02/01/2017   PLT 205.0 02/01/2017   GLUCOSE 91 02/01/2017   CHOL 186 02/01/2017   TRIG 81.0 02/01/2017   HDL 62.70 02/01/2017   LDLCALC 107 (H) 02/01/2017   ALT 10 02/01/2017  AST 16 02/01/2017   NA 139 02/01/2017   K 4.6 02/01/2017   CL 105 02/01/2017   CREATININE 0.83 02/01/2017   BUN 15 02/01/2017   CO2 29 02/01/2017   TSH 1.77 02/01/2017    Mm Screening Breast Tomo Bilateral  Result Date: 03/03/2017 CLINICAL DATA:  Screening. EXAM: 2D DIGITAL SCREENING BILATERAL MAMMOGRAM WITH CAD AND ADJUNCT TOMO COMPARISON:  Previous exam(s). ACR Breast Density Category b: There are scattered areas of fibroglandular density. FINDINGS: There are no findings suspicious for malignancy. Images were processed with CAD. IMPRESSION: No mammographic evidence of malignancy. A result letter of this screening mammogram will be mailed directly to the patient. RECOMMENDATION: Screening mammogram in one year. (Code:SM-B-01Y) BI-RADS CATEGORY  1: Negative. Electronically Signed   By: Lajean Manes M.D.   On: 03/03/2017 12:25       Assessment & Plan:   Problem List Items Addressed This Visit    Finger ulcer ([place])    Continue amlodipine.  Stable.       GERD (gastroesophageal reflux disease)    Controlled on current regimen.  Follow.        Health care maintenance    Physical today 02/06/18.  PAP 02/01/17 - negative with negative HPV.  Colonoscopy 05/2015 diminutive polyp, diverticulosis and internal hemorrhoids.        History of colonic polyps    Colonoscopy 04/05/13 as outlined.   Recommended f/u colonoscopy in 03/2018.        Scleroderma ([place])    Followed by Dr Jefm Bryant.  Discussed swallowing.  Desires no further intervention.  Follow.        Vitamin D deficiency    Follow vitamin D level.         Other Visit Diagnoses    Routine general medical examination at a health care facility    -  Primary       Einar Pheasant, MD

## 2018-02-06 NOTE — Assessment & Plan Note (Signed)
Physical today 02/06/18.  PAP 02/01/17 - negative with negative HPV.  Colonoscopy 05/2015 diminutive polyp, diverticulosis and internal hemorrhoids.

## 2018-02-07 ENCOUNTER — Encounter: Payer: Self-pay | Admitting: Internal Medicine

## 2018-02-07 ENCOUNTER — Telehealth: Payer: Self-pay | Admitting: Radiology

## 2018-02-07 DIAGNOSIS — Z13220 Encounter for screening for lipoid disorders: Secondary | ICD-10-CM

## 2018-02-07 NOTE — Telephone Encounter (Signed)
Order placed for fasting cholesterol.

## 2018-02-07 NOTE — Telephone Encounter (Signed)
Pt scheduled fasting labs @ 10:15am on 7.26.19

## 2018-02-07 NOTE — Assessment & Plan Note (Signed)
Followed by Dr Jefm Bryant.  Discussed swallowing.  Desires no further intervention.  Follow.

## 2018-02-07 NOTE — Telephone Encounter (Signed)
PT coming in for labs Friday, please place future orders.Thank you

## 2018-02-07 NOTE — Assessment & Plan Note (Signed)
Continue amlodipine.  Stable.   

## 2018-02-07 NOTE — Assessment & Plan Note (Signed)
Colonoscopy 04/05/13 as outlined.  Recommended f/u colonoscopy in 03/2018.

## 2018-02-07 NOTE — Assessment & Plan Note (Signed)
Controlled on current regimen.  Follow.  

## 2018-02-07 NOTE — Assessment & Plan Note (Signed)
Follow vitamin D level.  

## 2018-02-09 ENCOUNTER — Other Ambulatory Visit (INDEPENDENT_AMBULATORY_CARE_PROVIDER_SITE_OTHER): Payer: BC Managed Care – PPO

## 2018-02-09 DIAGNOSIS — Z13220 Encounter for screening for lipoid disorders: Secondary | ICD-10-CM

## 2018-02-09 LAB — LIPID PANEL
CHOLESTEROL: 197 mg/dL (ref 0–200)
HDL: 61.4 mg/dL (ref >39.00)
LDL Cholesterol: 115 mg/dL — ABNORMAL HIGH (ref 0–99)
NonHDL: 135.94
Total CHOL/HDL Ratio: 3
Triglycerides: 107 mg/dL (ref 0.0–149.0)
VLDL: 21.4 mg/dL (ref 0.0–40.0)

## 2018-02-12 ENCOUNTER — Encounter: Payer: Self-pay | Admitting: Internal Medicine

## 2018-03-05 ENCOUNTER — Ambulatory Visit
Admission: RE | Admit: 2018-03-05 | Discharge: 2018-03-05 | Disposition: A | Discharge status: Home / Self Care | Payer: BC Managed Care – PPO | Source: Ambulatory Visit | Attending: Internal Medicine | Admitting: Internal Medicine

## 2018-03-05 DIAGNOSIS — Z1231 Encounter for screening mammogram for malignant neoplasm of breast: Secondary | ICD-10-CM | POA: Diagnosis present

## 2018-03-26 DIAGNOSIS — D2270 Melanocytic nevi of unspecified lower limb, including hip: Secondary | ICD-10-CM | POA: Diagnosis not present

## 2018-03-26 DIAGNOSIS — Z1283 Encounter for screening for malignant neoplasm of skin: Secondary | ICD-10-CM | POA: Diagnosis not present

## 2018-03-26 DIAGNOSIS — D225 Melanocytic nevi of trunk: Secondary | ICD-10-CM | POA: Diagnosis not present

## 2018-03-29 DIAGNOSIS — R0602 Shortness of breath: Secondary | ICD-10-CM | POA: Diagnosis not present

## 2018-03-29 DIAGNOSIS — M349 Systemic sclerosis, unspecified: Secondary | ICD-10-CM | POA: Diagnosis not present

## 2018-06-05 ENCOUNTER — Encounter: Payer: Self-pay | Admitting: Internal Medicine

## 2018-06-06 ENCOUNTER — Telehealth: Payer: Self-pay

## 2018-06-06 NOTE — Telephone Encounter (Signed)
There is a my chart message in your box for this pt.

## 2018-06-06 NOTE — Telephone Encounter (Signed)
Copied from [place] [id number]. Topic: General - Inquiry >> Jun 06, 2018  9:17 AM Conception Chancy, NT wrote: Reason for CRM: patient is calling and following up on the mychart message she sent yesterday 06/05/18.

## 2018-08-07 DIAGNOSIS — I7300 Raynaud's syndrome without gangrene: Secondary | ICD-10-CM | POA: Diagnosis not present

## 2018-08-07 DIAGNOSIS — M25562 Pain in left knee: Secondary | ICD-10-CM | POA: Diagnosis not present

## 2018-08-07 DIAGNOSIS — M349 Systemic sclerosis, unspecified: Secondary | ICD-10-CM | POA: Diagnosis not present

## 2018-08-08 LAB — BASIC METABOLIC PANEL
BUN: 11 (ref 4–21)
CREATININE: 0.8 (ref 0.5–1.1)
GLUCOSE: 86
Potassium: 4.5 (ref 3.4–5.3)
Sodium: 142 (ref 137–147)

## 2018-08-08 LAB — CBC AND DIFFERENTIAL
HCT: 43 (ref 36–46)
Hemoglobin: 13.8 (ref 12.0–16.0)
PLATELETS: 204 (ref 150–399)
WBC: 5.1

## 2018-08-08 LAB — HEPATIC FUNCTION PANEL
ALK PHOS: 56 (ref 25–125)
ALT: 12 (ref 7–35)
AST: 14 (ref 13–35)
Bilirubin, Total: 0.6

## 2018-08-08 LAB — LIPID PANEL
Cholesterol: 208 — AB (ref 0–200)
HDL: 61 (ref 35–70)
LDL CALC: 124
TRIGLYCERIDES: 118 (ref 40–160)

## 2018-08-08 LAB — TSH: TSH: 2.2 (ref 0.41–5.90)

## 2018-08-08 LAB — VITAMIN D 25 HYDROXY (VIT D DEFICIENCY, FRACTURES): Vit D, 25-Hydroxy: 46.1

## 2018-08-09 ENCOUNTER — Ambulatory Visit: Payer: BC Managed Care – PPO | Admitting: Internal Medicine

## 2018-08-13 ENCOUNTER — Ambulatory Visit: Payer: BC Managed Care – PPO | Admitting: Internal Medicine

## 2018-08-22 ENCOUNTER — Ambulatory Visit (INDEPENDENT_AMBULATORY_CARE_PROVIDER_SITE_OTHER): Payer: BC Managed Care – PPO | Admitting: Internal Medicine

## 2018-08-22 ENCOUNTER — Encounter: Payer: Self-pay | Admitting: Internal Medicine

## 2018-08-22 VITALS — BP 124/72 | HR 72 | Temp 97.6°F | Resp 18 | Wt 164.8 lb

## 2018-08-22 DIAGNOSIS — L98499 Non-pressure chronic ulcer of skin of other sites with unspecified severity: Secondary | ICD-10-CM

## 2018-08-22 DIAGNOSIS — M349 Systemic sclerosis, unspecified: Secondary | ICD-10-CM | POA: Diagnosis not present

## 2018-08-22 DIAGNOSIS — E559 Vitamin D deficiency, unspecified: Secondary | ICD-10-CM

## 2018-08-22 DIAGNOSIS — I7300 Raynaud's syndrome without gangrene: Secondary | ICD-10-CM | POA: Diagnosis not present

## 2018-08-22 DIAGNOSIS — K2100 Gastro-esophageal reflux disease with esophagitis, without bleeding: Secondary | ICD-10-CM

## 2018-08-22 DIAGNOSIS — J3489 Other specified disorders of nose and nasal sinuses: Secondary | ICD-10-CM

## 2018-08-22 MED ORDER — AZELASTINE HCL 0.1 % NA SOLN: 1.0000 | Freq: Two times a day (BID) | NASAL | 0 refills | Status: DC

## 2018-08-22 NOTE — Progress Notes (Signed)
Patient ID: Denise Arnold, female   DOB: 02/02/1961, 58 y.o.   MRN: [id number]   Subjective:    Patient ID: Denise Arnold, female    DOB: 1961-05-31, 58 y.o.   MRN: [id number]  HPI  Patient here for a scheduled follow up.  She reports she has been doing relatively well.  Has been exercising.  No chest pain.  No sob.  No acid reflux.  No abdominal pain.  Bowels moving.  States had a cold over the past 7-10 days. Better now, but still with some increased drainage.  Has taken zyrtec.  Dries her out too much.  No acid reflux.  No fever.  No cough or chest congestion.  Discussed labs.     Past Medical History:  Diagnosis Date  . Arthritis   . GERD (gastroesophageal reflux disease)   . History of endoscopy 04/05/2013   upper  . Menorrhagia   . 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 Scleroderma ([facility])    positive FANA, positive SCL-70 abs, raynauds, mild pulmonary hypertension, normal DLCO  . Tricuspid regurgitation   . Vitamin D deficiency    Past Surgical History:  Procedure Laterality Date  . CESAREAN SECTION  1994  . COLONOSCOPY WITH PROPOFOL N/A 05/29/2015   Procedure: COLONOSCOPY WITH PROPOFOL;  Surgeon: Manya Silvas, MD;  Location: [facility] ENDOSCOPY;  Service: Endoscopy;  Laterality: N/A;  . DILATION AND CURETTAGE OF UTERUS  1992  . DILATION AND CURETTAGE OF UTERUS  1993  . DILATION AND CURETTAGE OF UTERUS  2011  . VEIN SURGERY     Family History  Problem Relation Age of Onset  . Heart disease Father        myocardial infarction  . Arthritis/Rheumatoid Father   . Hypertension Father   . Hyperlipidemia Mother   . Breast cancer Maternal Grandmother   . Colon cancer Neg Hx    Social History   Socioeconomic History  . Marital status: Married    Spouse name: Not on file  . Number of children: 1  . Years of education: Not on file  . Highest education level: Not on file  Occupational History  . Not on file  Social Needs  . Financial resource strain: Not on file  . Food insecurity:    Worry: Not on file    Inability: Not on file  . Transportation needs:    Medical: Not on file    Non-medical: Not on file  Tobacco Use  . Smoking status: Never Smoker  . Smokeless tobacco: Never Used  Substance and Sexual Activity  . Alcohol use: No    Alcohol/week: 0.0 standard drinks  . Drug use: No  . Sexual activity: Not on file  Lifestyle  . Physical activity:    Days per week: Not on file    Minutes per session: Not on file  . Stress: Not on file  Relationships  . Social connections:    Talks on phone: Not on file    Gets together: Not on file    Attends religious service: Not on file    Active member of club or organization: Not on file    Attends meetings of clubs or organizations: Not on file    Relationship status: Not on file  Other Topics Concern  . Not on file  Social History Narrative  . Not on file    Outpatient Encounter Medications as of 08/22/2018  Medication Sig  . amLODipine (NORVASC) 10 MG tablet Take 10 mg by  mouth daily.  Marland Kitchen azelastine (ASTELIN) 0.1 % nasal spray Place 1 spray into both nostrils 2 (two) times daily. Use in each nostril as directed  . Calcium-Magnesium-Vitamin D (CALCIUM 500 PO) Take by mouth.  . cholecalciferol (VITAMIN D) 1000 UNITS tablet Take 1,000 Units by mouth daily.  . fish oil-omega-3 fatty acids 1000 MG capsule Take 1 g by mouth daily.  Marland Kitchen glucosamine-chondroitin 500-400 MG tablet Take 1 tablet by mouth daily.  Marland Kitchen MISC NATURAL PRODUCTS PO Herbal Supplement  . Multiple Vitamin (MULTIVITAMIN) tablet Take 1 tablet by mouth daily.  . nitroGLYCERIN (NITROGLYN) 2 % ointment Apply 0.5 inches topically 3 (three) times daily as needed.   Marland Kitchen omeprazole (PRILOSEC) 20 MG capsule Take 20 mg by mouth 2 (two) times daily.   . Probiotic Product (PROBIOTIC PO) Take by mouth.   No facility-administered encounter medications on file as of 08/22/2018.     Review of Systems  Constitutional: Negative for appetite change and unexpected weight  change.  HENT: Positive for congestion and postnasal drip. Negative for sinus pressure.   Respiratory: Negative for cough, chest tightness and shortness of breath.   Cardiovascular: Negative for chest pain, palpitations and leg swelling.  Gastrointestinal: Negative for abdominal pain, diarrhea, nausea and vomiting.  Genitourinary: Negative for difficulty urinating and dysuria.  Musculoskeletal: Negative for joint swelling and myalgias.  Skin: Negative for color change and rash.  Neurological: Negative for dizziness, light-headedness and headaches.  Psychiatric/Behavioral: Negative for agitation and dysphoric mood.       Objective:     Blood pressure rechecked by me:  112/72  Physical Exam Constitutional:      General: She is not in acute distress.    Appearance: Normal appearance.  HENT:     Nose: Nose normal. No congestion.     Mouth/Throat:     Pharynx: No oropharyngeal exudate or posterior oropharyngeal erythema.  Neck:     Musculoskeletal: Neck supple. No muscular tenderness.     Thyroid: No thyromegaly.  Cardiovascular:     Rate and Rhythm: Normal rate and regular rhythm.  Pulmonary:     Effort: No respiratory distress.     Breath sounds: Normal breath sounds. No wheezing.  Abdominal:     General: Bowel sounds are normal.     Palpations: Abdomen is soft.     Tenderness: There is no abdominal tenderness.  Musculoskeletal:        General: No swelling or tenderness.  Lymphadenopathy:     Cervical: No cervical adenopathy.  Skin:    Findings: No erythema or rash.  Neurological:     Mental Status: She is alert.  Psychiatric:        Mood and Affect: Mood normal.        Behavior: Behavior normal.     BP 124/72 (BP Location: Left Arm, Patient Position: Sitting, Cuff Size: Normal)   Pulse 72   Temp 97.6 F (36.4 C) (Oral)   Resp 18   Wt 164 lb 12.8 oz (74.8 kg)   LMP 02/14/2010   SpO2 96%   BMI 28.29 kg/m  Wt Readings from Last 3 Encounters:  08/22/18 164 lb  12.8 oz (74.8 kg)  02/06/18 166 lb (75.3 kg)  01/25/18 165 lb (74.8 kg)     Lab Results  Component Value Date   WBC 5.1 08/08/2018   HGB 13.8 08/08/2018   HCT 43 08/08/2018   PLT 204 08/08/2018   GLUCOSE 91 02/01/2017   CHOL 208 (A) 08/08/2018  TRIG 118 08/08/2018   HDL 61 08/08/2018   LDLCALC 124 08/08/2018   ALT 12 08/08/2018   AST 14 08/08/2018   NA 142 08/08/2018   K 4.5 08/08/2018   CL 105 02/01/2017   CREATININE 0.8 08/08/2018   BUN 11 08/08/2018   CO2 29 02/01/2017   TSH 2.20 08/08/2018    Mm 3d Screen Breast Bilateral  Result Date: 03/05/2018 CLINICAL DATA:  Screening. EXAM: DIGITAL SCREENING BILATERAL MAMMOGRAM WITH TOMO AND CAD COMPARISON:  Previous exam(s). ACR Breast Density Category b: There are scattered areas of fibroglandular density. FINDINGS: There are no findings suspicious for malignancy. Images were processed with CAD. IMPRESSION: No mammographic evidence of malignancy. A result letter of this screening mammogram will be mailed directly to the patient. RECOMMENDATION: Screening mammogram in one year. (Code:SM-B-01Y) BI-RADS CATEGORY  1: Negative. Electronically Signed   By: Nolon Nations M.D.   On: 03/05/2018 12:28       Assessment & Plan:   Problem List Items Addressed This Visit    Finger ulcer ([place])    Continue amlodipine.  Stable.        GERD (gastroesophageal reflux disease)    Controlled.        Raynauds phenomenon    On amlodipine.  Followed by Dr Jefm Bryant.  Stable.        Scleroderma ([place])    Followed by Dr Jefm Bryant.  She feels is stable.        Vitamin D deficiency    Follow vitamin D level.        Other Visit Diagnoses    Nasal drainage    -  Primary   Astelin nasal spray as directed.  Follow.         Einar Pheasant, MD

## 2018-08-24 ENCOUNTER — Ambulatory Visit: Payer: BC Managed Care – PPO | Admitting: Internal Medicine

## 2018-08-26 ENCOUNTER — Encounter: Payer: Self-pay | Admitting: Internal Medicine

## 2018-08-26 NOTE — Assessment & Plan Note (Signed)
Controlled.  

## 2018-08-26 NOTE — Assessment & Plan Note (Signed)
On amlodipine.  Followed by Dr Jefm Bryant.  Stable.

## 2018-08-26 NOTE — Assessment & Plan Note (Signed)
Followed by Dr Jefm Bryant.  She feels is stable.

## 2018-08-26 NOTE — Assessment & Plan Note (Signed)
Continue amlodipine.  Stable.

## 2018-08-26 NOTE — Assessment & Plan Note (Signed)
Follow vitamin D level.  

## 2018-10-23 ENCOUNTER — Telehealth (INDEPENDENT_AMBULATORY_CARE_PROVIDER_SITE_OTHER): Payer: BC Managed Care – PPO

## 2018-10-23 ENCOUNTER — Ambulatory Visit (INDEPENDENT_AMBULATORY_CARE_PROVIDER_SITE_OTHER): Payer: BC Managed Care – PPO | Admitting: Internal Medicine

## 2018-10-23 ENCOUNTER — Encounter: Payer: Self-pay | Admitting: Internal Medicine

## 2018-10-23 DIAGNOSIS — N3000 Acute cystitis without hematuria: Secondary | ICD-10-CM

## 2018-10-23 DIAGNOSIS — R300 Dysuria: Secondary | ICD-10-CM

## 2018-10-23 LAB — POCT URINALYSIS DIPSTICK
Blood, UA: NEGATIVE
Glucose, UA: NEGATIVE
Nitrite, UA: NEGATIVE
Protein, UA: POSITIVE — AB
Spec Grav, UA: 1.025 (ref 1.010–1.025)
Urobilinogen, UA: 0.2 E.U./dL
pH, UA: 5.5 (ref 5.0–8.0)

## 2018-10-23 LAB — URINALYSIS, MICROSCOPIC ONLY

## 2018-10-23 MED ORDER — FLUCONAZOLE 150 MG PO TABS: ORAL_TABLET | ORAL | 0 refills | Status: DC

## 2018-10-23 MED ORDER — PHENAZOPYRIDINE HCL 200 MG PO TABS: 200.0000 mg | ORAL_TABLET | Freq: Three times a day (TID) | ORAL | 0 refills | Status: DC | PRN

## 2018-10-23 MED ORDER — NITROFURANTOIN MONOHYD MACRO 100 MG PO CAPS: 100.0000 mg | ORAL_CAPSULE | Freq: Two times a day (BID) | ORAL | 0 refills | Status: DC

## 2018-10-23 NOTE — Progress Notes (Addendum)
Patient ID: Denise Arnold, female   DOB: 1960-11-01, 58 y.o.   MRN: [id number] Telephone Visit:  Note  I connected with Denise Arnold on 10/23/18 at  2:00 PM EDT by telephone.  Verified that I am speaking with the correct person using two identifiers.  Location patient: home Location provider:work Persons participating in the telephone visit: patient, provider  I discussed the limitations of evaluation and management by telephone.  This visit type was conducted due to national recommendations for restrictions regarding the COVID-19 pandemic.  This format is felt to be most appropriate for this patient at this time.   The patient expressed understanding and agreed to proceed.   HPI: Scheduled as an acute visit.  She reports that symptoms started two days ago.  Increased yesterday.  Dysuria.  Urgency.  Low back - sore.  Urine darker.  No fever.  Temp 98.9.  No vomiting or diarrhea.  No abdominal pain.  No hematuria and no vaginal discharge.  Last uti - two years ago.  Feels similar.  Request diflucan.     ROS: See pertinent positives and negatives per HPI.  Past Medical History:  Diagnosis Date  . Arthritis   . GERD (gastroesophageal reflux disease)   . History of endoscopy 04/05/2013   upper  . Menorrhagia   . 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 Scleroderma ([facility]CC)    positive FANA, positive SCL-70 abs, raynauds, mild pulmonary hypertension, normal DLCO  . Tricuspid regurgitation   . Vitamin D deficiency     Past Surgical History:  Procedure Laterality Date  . CESAREAN SECTION  1994  . COLONOSCOPY WITH PROPOFOL N/A 05/29/2015   Procedure: COLONOSCOPY WITH PROPOFOL;  Surgeon: Manya Silvas, MD;  Location: [facility] ENDOSCOPY;  Service: Endoscopy;  Laterality: N/A;  . DILATION AND CURETTAGE OF UTERUS  1992  . DILATION AND CURETTAGE OF UTERUS  1993  . DILATION AND CURETTAGE OF UTERUS  2011  . VEIN SURGERY      Family History  Problem Relation Age of Onset  . Heart disease Father    myocardial infarction  . Arthritis/Rheumatoid Father   . Hypertension Father   . Hyperlipidemia Mother   . Breast cancer Maternal Grandmother   . Colon cancer Neg Hx     SOCIAL HX: reviewed.    Current Outpatient Medications:  .  Biotin 5000 MCG TABS, , Disp: , Rfl:  .  amLODipine (NORVASC) 10 MG tablet, Take 10 mg by mouth daily., Disp: , Rfl: 1 .  azelastine (ASTELIN) 0.1 % nasal spray, Place 1 spray into both nostrils 2 (two) times daily. Use in each nostril as directed, Disp: 30 mL, Rfl: 0 .  Calcium-Magnesium-Vitamin D (CALCIUM 500 PO), Take by mouth., Disp: , Rfl:  .  cholecalciferol (VITAMIN D) 1000 UNITS tablet, Take 1,000 Units by mouth daily., Disp: , Rfl:  .  fish oil-omega-3 fatty acids 1000 MG capsule, Take 1 g by mouth daily., Disp: , Rfl:  .  fluconazole (DIFLUCAN) 150 MG tablet, Take one tablet x 1 and then if persistent symptoms may repeat x 1 in 3 days., Disp: 2 tablet, Rfl: 0 .  glucosamine-chondroitin 500-400 MG tablet, Take 1 tablet by mouth daily., Disp: , Rfl:  .  MISC NATURAL PRODUCTS PO, Herbal Supplement, Disp: , Rfl:  .  Multiple Vitamin (MULTIVITAMIN) tablet, Take 1 tablet by mouth daily., Disp: , Rfl:  .  nitrofurantoin, macrocrystal-monohydrate, (MACROBID) 100 MG capsule, Take 1 capsule (100 mg total) by mouth 2 (two) times  daily., Disp: 10 capsule, Rfl: 0 .  nitroGLYCERIN (NITROGLYN) 2 % ointment, Apply 0.5 inches topically 3 (three) times daily as needed. , Disp: , Rfl:  .  omeprazole (PRILOSEC) 20 MG capsule, Take 20 mg by mouth 2 (two) times daily. , Disp: , Rfl:  .  phenazopyridine (PYRIDIUM) 200 MG tablet, Take 1 tablet (200 mg total) by mouth 3 (three) times daily as needed for pain., Disp: 6 tablet, Rfl: 0 .  Probiotic Product (PROBIOTIC PO), Take by mouth., Disp: , Rfl:   EXAM:  VITALS per patient if applicable: 21.3 - per pt.    GENERAL: alert.  Answering questions appropriately.  Sounds in no acute distress.    PSYCH/NEURO: pleasant and  cooperative, no obvious depression or anxiety, speech and thought processing grossly intact  ASSESSMENT AND PLAN:  Discussed the following assessment and plan:  Dysuria - Plan: Urine Microscopic, Urine Culture, CANCELED: POCT urinalysis dipstick, CANCELED: POCT urinalysis dipstick  Acute cystitis without hematuria     I discussed the assessment and treatment plan with the patient. The patient was provided an opportunity to ask questions and all were answered. The patient agreed with the plan and demonstrated an understanding of the instructions.   The patient was advised to call back or seek an in-person evaluation if the symptoms worsen or if the condition fails to improve as anticipated.  I provided 15 minutes of non-face-to-face time during this encounter.   Einar Pheasant, MD

## 2018-10-23 NOTE — Telephone Encounter (Signed)
Copied from [place] [id number]. Topic: General - Other >> Oct 23, 2018  8:50 AM Oneta Rack wrote: Relation to pt: self  Call back number: [phone]  Pharmacy: [id number] - [place], [place] - [phone] (Phone) [phone] (Fax)  Reason for call:  Patient states she has a UTI, urgency to urinate not voiding, burning, lower back pain  for 1 day, patient available for virtual appointment.

## 2018-10-23 NOTE — Telephone Encounter (Signed)
Lab orders placed.  

## 2018-10-23 NOTE — Telephone Encounter (Signed)
Called and spoke to pt.  Pt c/o of burning during urination, urge to urinate, lower back pain.  No fever.  Pt believes that this is probably a UTI since she had one 2 years ago and the symptoms are similar.  Pt scheduled for telephone visit w/ PCP today @ 2:00 pm.  Pt will come into office prior to visit to give urine sample in lab.

## 2018-10-25 ENCOUNTER — Encounter: Payer: Self-pay | Admitting: Internal Medicine

## 2018-10-25 LAB — URINE CULTURE
MICRO NUMBER:: 380357
SPECIMEN QUALITY:: ADEQUATE

## 2018-10-27 ENCOUNTER — Encounter: Payer: Self-pay | Admitting: Internal Medicine

## 2018-10-27 DIAGNOSIS — N390 Urinary tract infection, site not specified: Secondary | ICD-10-CM | POA: Insufficient documentation

## 2018-10-27 NOTE — Assessment & Plan Note (Signed)
Symptoms and urine dip appear to be c/w uti.  Treat with  Macrobid.  Probiotics as directed.  Pyridium.  Pt request to have diflucan if needed.  Stay hydrated.  Follow.

## 2018-10-29 ENCOUNTER — Telehealth: Payer: Self-pay

## 2018-10-29 NOTE — Telephone Encounter (Signed)
Patient would like to wait until she takes her other diflucan on Wednesday. She says urinary symptoms are better but she is having an itchy feeling in the vaginal area. Advised if symptoms worsen or do not improve with diflucan then she will need to be re evaluated. Pt has completed abx and took first diflucan on Sunday. Patient is taking probiotic daily as well

## 2018-10-29 NOTE — Telephone Encounter (Signed)
Copied from [place] [id number]. Topic: General - Other >> Oct 25, 2018  4:13 PM Celene Kras A wrote: Reason for CRM: Pt returning call to Dr. Lars Mage nurse. Please advise.

## 2018-10-29 NOTE — Telephone Encounter (Signed)
See my chart message

## 2018-10-29 NOTE — Telephone Encounter (Signed)
LMTCB

## 2018-11-01 ENCOUNTER — Telehealth: Payer: Self-pay

## 2018-11-01 MED ORDER — NITROFURANTOIN MONOHYD MACRO 100 MG PO CAPS: 100.0000 mg | ORAL_CAPSULE | Freq: Two times a day (BID) | ORAL | 0 refills | Status: DC

## 2018-11-01 NOTE — Telephone Encounter (Signed)
rx sent in for macrobid for 5 more days.  Continue probiotic as we discussed and continue for two weeks after completing abx.  If persistent symptoms, let us know.

## 2018-11-01 NOTE — Telephone Encounter (Signed)
Copied from [place] [id number]. Topic: Quick Communication - See Telephone Encounter >> Nov 01, 2018 11:33 AM Loma Boston wrote: CRM for notification. See Telephone encounter for: 11/01/18. PT had a phone visit with Dr. Nicki Reaper on 4/7 for possible UTI and was given medication but is still having all the same symptoms. She would like either Dr Nicki Reaper or her nurse to return call to her as she is feeling very uncomfortable with urgency. Call back at [phone].

## 2018-11-01 NOTE — Telephone Encounter (Signed)
Just need to clarify.  She was placed on macrobid for UTI.  Symptoms are better, but not resolved?  If so, I can extend out abx or change abx to a different abx.

## 2018-11-01 NOTE — Telephone Encounter (Signed)
Symptoms are better and she says she does feel better but she can tell that they are not resolved and she is not having any new symptoms. She is okay with extending abx or changing abx.

## 2018-11-01 NOTE — Addendum Note (Signed)
Addended by: Alisa Graff on: 11/01/2018 04:00 PM   Modules accepted: Orders

## 2018-11-01 NOTE — Telephone Encounter (Signed)
I sent you a phone note about this as well.

## 2018-11-01 NOTE — Telephone Encounter (Signed)
Patient called in to let me know that she took her last diflucan yesterday. She is feeling some better but not completely. Thought it may be a yeast infection at first but diflucan did not resolve. She is not having the back pain, no fever, and other symptoms are some better but she is still having the urgency and pressure. She thinks she needs another antibiotic. Advised that I would send a message over and I may have to call her in the morning . She was okay with that.

## 2018-11-01 NOTE — Telephone Encounter (Signed)
See phone message.  Extended abx.

## 2018-11-02 NOTE — Telephone Encounter (Signed)
Pt aware.

## 2018-11-07 ENCOUNTER — Encounter: Payer: Self-pay | Admitting: Internal Medicine

## 2018-11-08 MED ORDER — FLUCONAZOLE 150 MG PO TABS: ORAL_TABLET | ORAL | 0 refills | Status: DC

## 2018-11-08 NOTE — Telephone Encounter (Signed)
Please clarify with pt if needed  I sent in a prescription for diflucan (two tablets as directed).  Did she get this prescription.  Has she taken?

## 2018-11-08 NOTE — Telephone Encounter (Signed)
rx sent in for diflucan.  If persistent problems, let us know.

## 2018-11-08 NOTE — Telephone Encounter (Signed)
She took the diflucan that was given on 4/7 and then treated with another abx so needs another rx for diflucan.

## 2019-01-02 ENCOUNTER — Other Ambulatory Visit: Payer: Self-pay

## 2019-01-03 ENCOUNTER — Ambulatory Visit (INDEPENDENT_AMBULATORY_CARE_PROVIDER_SITE_OTHER): Payer: BC Managed Care – PPO | Admitting: Family Medicine

## 2019-01-03 VITALS — BP 102/64 | HR 65 | Temp 98.2°F | Resp 16 | Ht 64.0 in | Wt 164.8 lb

## 2019-01-03 DIAGNOSIS — R2233 Localized swelling, mass and lump, upper limb, bilateral: Secondary | ICD-10-CM

## 2019-01-03 NOTE — Progress Notes (Signed)
Subjective:    Patient ID: Denise Arnold, female    DOB: 11/26/1960, 58 y.o.   MRN: [id number]  HPI   Patient presents to clinic due to suspected lumps in bilateral axilla.  Patient states she noticed areas a couple of weeks ago, and became concerned.  Does have family history of breast cancer.  Mammogram performed last August was given a score of BI-RADS 1.  She plans to have repeat mammogram in August for her annual screening.  Denies any suspicious areas in her breasts or any changes in breast skin.  Denies any nipple drainage or discharge or inverted nipple.    Patient Active Problem List   Diagnosis Date Noted  . UTI (urinary tract infection) 10/27/2018  . Cellulitis 01/16/2018  . Cough 10/19/2016  . Health care maintenance 01/25/2015  . Finger ulcer ([place]) 07/26/2014  . History of colonic polyps 04/22/2013  . Vitamin D deficiency 01/15/2013  . Degenerative cervical disc 01/15/2013  . GERD (gastroesophageal reflux disease) 01/15/2013  . Scleroderma ([place]) 07/17/2012  . Raynauds phenomenon 07/17/2012   Social History   Tobacco Use  . Smoking status: Never Smoker  . Smokeless tobacco: Never Used  Substance Use Topics  . Alcohol use: No    Alcohol/week: 0.0 standard drinks   Review of Systems  Constitutional: Negative for chills, fatigue and fever.  HENT: Negative for congestion, ear pain, sinus pain and sore throat.   Eyes: Negative.   Respiratory: Negative for cough, shortness of breath and wheezing.   Cardiovascular: Negative for chest pain, palpitations and leg swelling.  Gastrointestinal: Negative for abdominal pain, diarrhea, nausea and vomiting.  Genitourinary: Negative for dysuria, frequency and urgency.  Musculoskeletal: Negative for arthralgias and myalgias.  Skin: Negative for color change, pallor and rash. ?mass in both axilla Neurological: Negative for syncope, light-headedness and headaches.  Psychiatric/Behavioral: The patient is not nervous/anxious.     Objective:   Physical Exam Vitals signs and nursing note reviewed.  Constitutional:      General: She is not in acute distress.    Appearance: She is not ill-appearing, toxic-appearing or diaphoretic.  Eyes:     General: No scleral icterus.    Extraocular Movements: Extraocular movements intact.     Conjunctiva/sclera: Conjunctivae normal.  Cardiovascular:     Rate and Rhythm: Normal rate and regular rhythm.     Heart sounds: Normal heart sounds.  Pulmonary:     Effort: Pulmonary effort is normal.     Breath sounds: Normal breath sounds.  Chest:       Comments: Location of questionable lump/mass areas in the axilla representing red marks on diagram.  Areas could be fatty tissue and/or lipoma Skin:    General: Skin is warm and dry.     Coloration: Skin is not jaundiced or pale.     Findings: No erythema or rash.  Neurological:     Mental Status: She is alert and oriented to person, place, and time.     Gait: Gait normal.  Psychiatric:        Mood and Affect: Mood normal.        Behavior: Behavior normal.    Vitals:   01/03/19 1309  BP: 102/64  Pulse: 65  Resp: 16  Temp: 98.2 F (36.8 C)  SpO2: 98%      Assessment & Plan:    Possible lump or mass in bilateral axillae- we will get ultrasound to further investigate.  The area in bilateral axillae does not feel hard  or fixed.  Area feels more consistent with fatty tissue or a lipoma.  Patient reassured that her mammograms have been negative and this is a good sign.  Ultrasound will help [place] to determine what these areas are.  Advised to call office right away if any changes occur including increasing in size, development of pain, redness of skin.  Patient aware she will be contacted with ultrasound appointment details.  She will otherwise keep regularly scheduled follow-up with PCP as planned.

## 2019-01-06 ENCOUNTER — Telehealth: Payer: BC Managed Care – PPO | Admitting: Physician Assistant

## 2019-01-06 DIAGNOSIS — R300 Dysuria: Secondary | ICD-10-CM

## 2019-01-06 MED ORDER — NITROFURANTOIN MONOHYD MACRO 100 MG PO CAPS: 100.0000 mg | ORAL_CAPSULE | Freq: Two times a day (BID) | ORAL | 0 refills | Status: DC

## 2019-01-06 MED ORDER — FLUCONAZOLE 150 MG PO TABS: ORAL_TABLET | ORAL | 0 refills | Status: DC

## 2019-01-06 NOTE — Progress Notes (Signed)
We are sorry that you are not feeling well.  Here is how we plan to help!  Based on what you shared with me it looks like you most likely have a simple urinary tract infection.  A UTI (Urinary Tract Infection) is a bacterial infection of the bladder.  Most cases of urinary tract infections are simple to treat but a key part of your care is to encourage you to drink plenty of fluids and watch your symptoms carefully.  I have prescribed MacroBid 100 mg twice a day for 5 days. I will also send in a Diflucan for yeast. Your symptoms should gradually improve. Call us if the burning in your urine worsens, you develop worsening fever, back pain or pelvic pain or if your symptoms do not resolve after completing the antibiotic.  Urinary tract infections can be prevented by drinking plenty of water to keep your body hydrated.  Also be sure when you wipe, wipe from front to back and don't hold it in!  If possible, empty your bladder every 4 hours.  Your e-visit answers were reviewed by a board certified advanced clinical practitioner to complete your personal care plan.  Depending on the condition, your plan could have included both over the counter or prescription medications.  If there is a problem please reply  once you have received a response from your provider.  Your safety is important to [place].  If you have drug allergies check your prescription carefully.    You can use MyChart to ask questions about today's visit, request a non-urgent call back, or ask for a work or school excuse for 24 hours related to this e-Visit. If it has been greater than 24 hours you will need to follow up with your provider, or enter a new e-Visit to address those concerns.   You will get an e-mail in the next two days asking about your experience.  I hope that your e-visit has been valuable and will speed your recovery. Thank you for using e-visits.

## 2019-01-06 NOTE — Progress Notes (Signed)
I have spent 5 minutes in review of e-visit questionnaire, review and updating patient chart, medical decision making and response to patient.   Airiana Elman Cody Brooklynn Brandenburg, PA-C    

## 2019-01-08 ENCOUNTER — Encounter: Payer: Self-pay | Admitting: Internal Medicine

## 2019-01-09 ENCOUNTER — Other Ambulatory Visit: Payer: Self-pay | Admitting: Family Medicine

## 2019-01-09 ENCOUNTER — Other Ambulatory Visit (INDEPENDENT_AMBULATORY_CARE_PROVIDER_SITE_OTHER): Payer: BC Managed Care – PPO

## 2019-01-09 ENCOUNTER — Telehealth: Payer: Self-pay

## 2019-01-09 ENCOUNTER — Other Ambulatory Visit: Payer: Self-pay

## 2019-01-09 DIAGNOSIS — R2233 Localized swelling, mass and lump, upper limb, bilateral: Secondary | ICD-10-CM

## 2019-01-09 DIAGNOSIS — R300 Dysuria: Secondary | ICD-10-CM | POA: Diagnosis not present

## 2019-01-09 LAB — POCT URINALYSIS DIPSTICK
Blood, UA: NEGATIVE
Glucose, UA: NEGATIVE
Ketones, UA: 15
Nitrite, UA: POSITIVE
Protein, UA: POSITIVE — AB
Spec Grav, UA: >=1.03 — AB (ref 1.010–1.025)
Urobilinogen, UA: 2 E.U./dL — AB
pH, UA: 5.5 (ref 5.0–8.0)

## 2019-01-09 LAB — URINALYSIS, MICROSCOPIC ONLY

## 2019-01-09 NOTE — Telephone Encounter (Signed)
Urgency to urinate, burning upon urination and feeling of needing to go but no urine. Taking ATBs and AZO over the counter. Patient on the way to office to leave a urine sample.

## 2019-01-09 NOTE — Telephone Encounter (Signed)
Called patient x3 times to get more information regarding my chart message. Need to triage and get more information about symptoms and severity of pain. My chart message sent about bringing in urine sample as well.

## 2019-01-09 NOTE — Addendum Note (Signed)
Addended by: Lars Masson on: 01/09/2019 11:05 AM   Modules accepted: Orders

## 2019-01-09 NOTE — Telephone Encounter (Signed)
Patient stated only symptoms she is having is burning and urgency. Confirmed no vaginal symptoms. My Chart msg stated she was having pain a 9/10, confirmed with patient that she is not really having a pain but the burning and urgency has been pretty bad and has just gotten a little better today. No other acute symptoms. Pt has dropped off urine. Advised I would let her know when virtual appt will be. She is okay to do today or tomorrow. Her main concern is that she is going out of town on Friday and wants to get this taken care of before she leaves.

## 2019-01-09 NOTE — Telephone Encounter (Signed)
Since I am not in the office this pm, if she is ok to wait, schedule appt tomorrow.  If any problems or feels needs seen today, I will work in.

## 2019-01-09 NOTE — Telephone Encounter (Signed)
Appt scheduled for tomorrow at 8am. 

## 2019-01-09 NOTE — Telephone Encounter (Signed)
Already addressed

## 2019-01-10 ENCOUNTER — Ambulatory Visit (INDEPENDENT_AMBULATORY_CARE_PROVIDER_SITE_OTHER): Payer: BC Managed Care – PPO | Admitting: Internal Medicine

## 2019-01-10 ENCOUNTER — Other Ambulatory Visit: Payer: Self-pay

## 2019-01-10 ENCOUNTER — Encounter: Payer: Self-pay | Admitting: Internal Medicine

## 2019-01-10 DIAGNOSIS — N3000 Acute cystitis without hematuria: Secondary | ICD-10-CM

## 2019-01-10 DIAGNOSIS — R222 Localized swelling, mass and lump, trunk: Secondary | ICD-10-CM | POA: Diagnosis not present

## 2019-01-10 DIAGNOSIS — R2230 Localized swelling, mass and lump, unspecified upper limb: Secondary | ICD-10-CM

## 2019-01-10 LAB — URINE CULTURE
MICRO NUMBER:: 602924
Result:: NO GROWTH
SPECIMEN QUALITY:: ADEQUATE

## 2019-01-10 MED ORDER — CIPROFLOXACIN HCL 500 MG PO TABS: 500.0000 mg | ORAL_TABLET | Freq: Two times a day (BID) | ORAL | 0 refills | Status: DC

## 2019-01-10 MED ORDER — PHENAZOPYRIDINE HCL 200 MG PO TABS: 200.0000 mg | ORAL_TABLET | Freq: Three times a day (TID) | ORAL | 0 refills | Status: DC | PRN

## 2019-01-10 NOTE — Progress Notes (Signed)
Patient ID: Denise Arnold, female   DOB: 02-Dec-1960, 58 y.o.   MRN: [id number]   Virtual Visit via video Note  This visit type was conducted due to national recommendations for restrictions regarding the COVID-19 pandemic (e.g. social distancing).  This format is felt to be most appropriate for this patient at this time.  All issues noted in this document were discussed and addressed.  No physical exam was performed (except for noted visual exam findings with Video Visits).   I connected with Aleathea Myler by a video enabled telemedicine application and verified that I am speaking with the correct person using two identifiers. Location patient: home Location provider: work  Persons participating in the virtual visit: patient, provider  I discussed the limitations, risks, security and privacy concerns of performing an evaluation and management service by video and the availability of in person appointments. The patient expressed understanding and agreed to proceed.   Reason for visit: acute visit  HPI: She had E visit recently and diagnosed with UTI.  Treated with macrobid.  States she is still having symptoms.  Reports increased urgency and dysuria.  Increased discomfort with urination.  No back pain.  No abdominal pain.  No fever.  Eating and drinking.  Bowels moving. 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  Was evaluated for bilateral axillary fullness.  States is better since being on abx.  Not completely resolved, but better.  Saw Lauren.  Scheduled for mammogram/ultrasound.  No vaginal symptoms.  No discharge or vaginal itching.     ROS: See pertinent positives and negatives per HPI.  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HCC)    Scleroderma (HCC)    positive FANA, positive SCL-70 abs, raynauds, mild pulmonary hypertension, normal DLCO    Tricuspid regurgitation    Vitamin D deficiency     Past Surgical History:  Procedure Laterality Date   CESAREAN SECTION  1994   COLONOSCOPY WITH PROPOFOL N/A 05/29/2015   Procedure: COLONOSCOPY WITH PROPOFOL;  Surgeon: Manya Silvas, MD;  Location: [facility];  Service: Endoscopy;  Laterality: N/A;   DILATION AND CURETTAGE OF UTERUS  1992   DILATION AND CURETTAGE OF UTERUS  1993   DILATION AND CURETTAGE OF UTERUS  2011   VEIN SURGERY      Family History  Problem Relation Age of Onset   Heart disease Father        myocardial infarction   Arthritis/Rheumatoid Father    Hypertension Father    Hyperlipidemia Mother    Breast cancer Maternal Grandmother    Colon cancer Neg Hx     SOCIAL HX: reviewed.    Current Outpatient Medications:    amLODipine (NORVASC) 10 MG tablet, Take 10 mg by mouth daily., Disp: , Rfl: 1   azelastine (ASTELIN) 0.1 % nasal spray, Place 1 spray into both nostrils 2 (two) times daily. Use in each nostril as directed, Disp: 30 mL, Rfl: 0   Biotin 5000 MCG TABS, , Disp: , Rfl:    Calcium-Magnesium-Vitamin D (CALCIUM 500 PO), Take by mouth., Disp: , Rfl:    cholecalciferol (VITAMIN D) 1000 UNITS tablet, Take 1,000 Units by mouth daily., Disp: , Rfl:    ciprofloxacin (CIPRO) 500 MG tablet, Take 1 tablet (500 mg total) by mouth 2 (two) times daily., Disp: 10 tablet, Rfl: 0  fish oil-omega-3 fatty acids 1000 MG capsule, Take 1 g by mouth daily., Disp: , Rfl:    fluconazole (DIFLUCAN) 150 MG tablet, Take one tablet x 1 and then if persistent symptoms may repeat x 1 in 3 days., Disp: 2 tablet, Rfl: 0   glucosamine-chondroitin 500-400 MG tablet, Take 1 tablet by mouth daily., Disp: , Rfl:    MISC NATURAL PRODUCTS PO, Herbal Supplement, Disp: , Rfl:    Multiple Vitamin (MULTIVITAMIN) tablet, Take 1 tablet by mouth daily., Disp: , Rfl:    nitroGLYCERIN (NITROGLYN) 2 % ointment, Apply 0.5 inches topically 3 (three) times daily as  needed. , Disp: , Rfl:    omeprazole (PRILOSEC) 20 MG capsule, Take 20 mg by mouth 2 (two) times daily. , Disp: , Rfl:    phenazopyridine (PYRIDIUM) 200 MG tablet, Take 1 tablet (200 mg total) by mouth 3 (three) times daily as needed for pain., Disp: 6 tablet, Rfl: 0   Probiotic Product (PROBIOTIC PO), Take by mouth., Disp: , Rfl:   EXAM:  GENERAL: alert, oriented, appears well and in no acute distress  HEENT: atraumatic, conjunttiva clear, no obvious abnormalities on inspection of external nose and ears  NECK: normal movements of the head and neck  LUNGS: on inspection no signs of respiratory distress, breathing rate appears normal, no obvious gross SOB, gasping or wheezing  CV: no obvious cyanosis  SKIN:  Minimal fullness - left axilla.  No significant tenderness to palpation.   PSYCH/NEURO: pleasant and cooperative, no obvious depression or anxiety, speech and thought processing grossly intact  ASSESSMENT AND PLAN:  Discussed the following assessment and plan:  UTI (urinary tract infection) Dysuria and urgency as outlined.  Urine dip with positive nitrites, moderate leukocytes.  On macrobid.   Persistent symptoms.  No vaginal symptoms.  Send for culture.  Change abx to cipro.  Pt allergic to pcn and sulfa.  Pyridium rx sent in for discomfort.  Stay hydrated.    Axillary fullness Recently evaluated for axillary fullness as outlined.  F/u mammogram and ultrasound ordered.  Improved with abx.  cipro for uti. Call with update to assess for continued improvement and resolution.      I discussed the assessment and treatment plan with the patient. The patient was provided an opportunity to ask questions and all were answered. The patient agreed with the plan and demonstrated an understanding of the instructions.   The patient was advised to call back or seek an in-person evaluation if the symptoms worsen or if the condition fails to improve as anticipated.   Einar Pheasant, MD

## 2019-01-13 ENCOUNTER — Encounter: Payer: Self-pay | Admitting: Internal Medicine

## 2019-01-13 DIAGNOSIS — R2230 Localized swelling, mass and lump, unspecified upper limb: Secondary | ICD-10-CM | POA: Insufficient documentation

## 2019-01-13 DIAGNOSIS — R222 Localized swelling, mass and lump, trunk: Secondary | ICD-10-CM | POA: Insufficient documentation

## 2019-01-13 NOTE — Assessment & Plan Note (Signed)
Dysuria and urgency as outlined.  Urine dip with positive nitrites, moderate leukocytes.  On macrobid.   Persistent symptoms.  No vaginal symptoms.  Send for culture.  Change abx to cipro.  Pt allergic to pcn and sulfa.  Pyridium rx sent in for discomfort.  Stay hydrated.

## 2019-01-13 NOTE — Assessment & Plan Note (Signed)
Recently evaluated for axillary fullness as outlined.  F/u mammogram and ultrasound ordered.  Improved with abx.  cipro for uti. Call with update to assess for continued improvement and resolution.

## 2019-01-22 ENCOUNTER — Other Ambulatory Visit: Payer: Self-pay | Admitting: Family Medicine

## 2019-01-22 ENCOUNTER — Ambulatory Visit
Admission: RE | Admit: 2019-01-22 | Discharge: 2019-01-22 | Disposition: A | Discharge status: Home / Self Care | Payer: BC Managed Care – PPO | Source: Ambulatory Visit | Attending: Family Medicine | Admitting: Family Medicine

## 2019-01-22 ENCOUNTER — Other Ambulatory Visit: Payer: Self-pay

## 2019-01-22 DIAGNOSIS — R2233 Localized swelling, mass and lump, upper limb, bilateral: Secondary | ICD-10-CM

## 2019-02-01 ENCOUNTER — Encounter: Payer: Self-pay | Admitting: Internal Medicine

## 2019-02-25 ENCOUNTER — Other Ambulatory Visit: Payer: Self-pay

## 2019-02-26 ENCOUNTER — Ambulatory Visit (INDEPENDENT_AMBULATORY_CARE_PROVIDER_SITE_OTHER): Payer: BC Managed Care – PPO | Admitting: Internal Medicine

## 2019-02-26 ENCOUNTER — Other Ambulatory Visit (HOSPITAL_COMMUNITY)
Admission: RE | Admit: 2019-02-26 | Discharge: 2019-02-26 | Disposition: A | Discharge status: Home / Self Care | Payer: BC Managed Care – PPO | Source: Ambulatory Visit | Attending: Internal Medicine | Admitting: Internal Medicine

## 2019-02-26 ENCOUNTER — Ambulatory Visit: Payer: BC Managed Care – PPO | Admitting: Internal Medicine

## 2019-02-26 VITALS — BP 110/62 | Temp 98.1°F | Resp 16 | Ht 64.0 in | Wt 164.4 lb

## 2019-02-26 DIAGNOSIS — Z0000 Encounter for general adult medical examination without abnormal findings: Secondary | ICD-10-CM

## 2019-02-26 DIAGNOSIS — R102 Pelvic and perineal pain unspecified side: Secondary | ICD-10-CM

## 2019-02-26 DIAGNOSIS — K2100 Gastro-esophageal reflux disease with esophagitis, without bleeding: Secondary | ICD-10-CM

## 2019-02-26 DIAGNOSIS — M349 Systemic sclerosis, unspecified: Secondary | ICD-10-CM

## 2019-02-26 DIAGNOSIS — Z124 Encounter for screening for malignant neoplasm of cervix: Secondary | ICD-10-CM | POA: Insufficient documentation

## 2019-02-26 DIAGNOSIS — I7300 Raynaud's syndrome without gangrene: Secondary | ICD-10-CM

## 2019-02-26 DIAGNOSIS — Z1211 Encounter for screening for malignant neoplasm of colon: Secondary | ICD-10-CM

## 2019-02-26 DIAGNOSIS — E559 Vitamin D deficiency, unspecified: Secondary | ICD-10-CM

## 2019-02-26 NOTE — Assessment & Plan Note (Addendum)
Physical today 02/26/19.  PAP 02/01/17 - negative with negative HPV.  Follow up pap today 02/26/19.  Colonoscopy 05/2015 - diminutive polyp, diverticulosis and internal hemorrhoids.  IFOB given.

## 2019-02-26 NOTE — Progress Notes (Signed)
Patient ID: Denise Arnold, female   DOB: July 05, 1961, 58 y.o.   MRN: [id number]   Subjective:    Patient ID: Denise Arnold, female    DOB: [birth year], 58 y.o.   MRN: [id number]  HPI  Patient here for her physical exam.  Reports she is doing relatively well.  Recently evaluated and diagnosed with UTI.  No urinary symptoms now.  Had urinalysis through rheumatology.  Urine ok.  She notes occasional vaginal pain.  Localized to outside vagina.  Not constant and not severe.  No intravaginal pain or discharge.  No bleeding.  No abdominal pain.  Bowels moving.  Staying active.  Still exercising.  Teaching exercise classes.  No chest pain.  No sob.  No acid reflux.  No abdominal pain. Bowels moving.     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HCC)    Scleroderma (HCC)    positive FANA, positive SCL-70 abs, raynauds, mild pulmonary hypertension, normal DLCO   Tricuspid regurgitation    Vitamin D deficiency    Past Surgical History:  Procedure Laterality Date   CESAREAN SECTION  1994   COLONOSCOPY WITH PROPOFOL N/A 05/29/2015   Procedure: COLONOSCOPY WITH PROPOFOL;  Surgeon: Manya Silvas, MD;  Location: [facility];  Service: Endoscopy;  Laterality: N/A;   DILATION AND CURETTAGE OF UTERUS  1992   DILATION AND CURETTAGE OF UTERUS  1993   DILATION AND CURETTAGE OF UTERUS  2011   VEIN SURGERY     Family History  Problem Relation Age of Onset   Heart disease Father        myocardial infarction   Arthritis/Rheumatoid Father    Hypertension Father    Hyperlipidemia Mother    Breast cancer Maternal Grandmother    Colon cancer Neg Hx    Social History   Socioeconomic History   Marital status: Married    Spouse name: Not on file   Number of children: 1   Years of education: Not on file   Highest education level: Not on file   Occupational History   Not on file  Social Needs   Financial resource strain: Not on file   Food insecurity    Worry: Not on file    Inability: Not on file   Transportation needs    Medical: Not on file    Non-medical: Not on file  Tobacco Use   Smoking status: Never Smoker   Smokeless tobacco: Never Used  Substance and Sexual Activity   Alcohol use: No    Alcohol/week: 0.0 standard drinks   Drug use: No   Sexual activity: Not on file  Lifestyle   Physical activity    Days per week: Not on file    Minutes per session: Not on file   Stress: Not on file  Relationships   Social connections    Talks on phone: Not on file    Gets together: Not on file    Attends religious service: Not on file    Active member of club or organization: Not on file    Attends meetings of clubs or organizations: Not on file    Relationship status: Not on file  Other Topics Concern   Not on file  Social History Narrative   Not on file    Outpatient Encounter Medications as of 02/26/2019  Medication  Sig   amLODipine (NORVASC) 10 MG tablet Take 10 mg by mouth daily.   azelastine (ASTELIN) 0.1 % nasal spray Place 1 spray into both nostrils 2 (two) times daily. Use in each nostril as directed   Biotin 5000 MCG TABS    Calcium-Magnesium-Vitamin D (CALCIUM 500 PO) Take by mouth.   cholecalciferol (VITAMIN D) 1000 UNITS tablet Take 1,000 Units by mouth daily.   ciprofloxacin (CIPRO) 500 MG tablet Take 1 tablet (500 mg total) by mouth 2 (two) times daily.   fish oil-omega-3 fatty acids 1000 MG capsule Take 1 g by mouth daily.   fluconazole (DIFLUCAN) 150 MG tablet Take one tablet x 1 and then if persistent symptoms may repeat x 1 in 3 days.   glucosamine-chondroitin 500-400 MG tablet Take 1 tablet by mouth daily.   MISC NATURAL PRODUCTS PO Herbal Supplement   Multiple Vitamin (MULTIVITAMIN) tablet Take 1 tablet by mouth daily.   nitroGLYCERIN (NITROGLYN) 2 % ointment Apply  0.5 inches topically 3 (three) times daily as needed.    omeprazole (PRILOSEC) 20 MG capsule Take 20 mg by mouth 2 (two) times daily.    phenazopyridine (PYRIDIUM) 200 MG tablet Take 1 tablet (200 mg total) by mouth 3 (three) times daily as needed for pain.   Probiotic Product (PROBIOTIC PO) Take by mouth.   No facility-administered encounter medications on file as of 02/26/2019.     Review of Systems  Constitutional: Negative for appetite change and unexpected weight change.  HENT: Negative for congestion and sinus pressure.   Eyes: Negative for pain and visual disturbance.  Respiratory: Negative for cough, chest tightness and shortness of breath.   Cardiovascular: Negative for chest pain, palpitations and leg swelling.  Gastrointestinal: Negative for abdominal pain, diarrhea, nausea and vomiting.  Genitourinary: Negative for difficulty urinating and dysuria.  Musculoskeletal: Negative for joint swelling and myalgias.  Skin: Negative for color change and rash.  Neurological: Negative for dizziness, light-headedness and headaches.  Hematological: Negative for adenopathy. Does not bruise/bleed easily.  Psychiatric/Behavioral: Negative for agitation and dysphoric mood.       Objective:    Physical Exam Constitutional:      General: She is not in acute distress.    Appearance: Normal appearance. She is well-developed.  HENT:     Right Ear: External ear normal.     Left Ear: External ear normal.  Eyes:     General: No scleral icterus.       Right eye: No discharge.        Left eye: No discharge.     Conjunctiva/sclera: Conjunctivae normal.  Neck:     Musculoskeletal: Neck supple. No muscular tenderness.     Thyroid: No thyromegaly.  Cardiovascular:     Rate and Rhythm: Normal rate and regular rhythm.  Pulmonary:     Effort: No tachypnea, accessory muscle usage or respiratory distress.     Breath sounds: Normal breath sounds. No decreased breath sounds or wheezing.  Chest:      Breasts:        Right: No inverted nipple, mass, nipple discharge or tenderness (no axillary adenopathy).        Left: No inverted nipple, mass, nipple discharge or tenderness (no axilarry adenopathy).  Abdominal:     General: Bowel sounds are normal.     Palpations: Abdomen is soft.     Tenderness: There is no abdominal tenderness.  Genitourinary:    Comments: Normal external genitalia.  Vaginal vault without lesions.  Cervix identified.  Pap smear performed.  Could not appreciate any adnexal masses or tenderness.   Musculoskeletal:        General: No swelling or tenderness.  Lymphadenopathy:     Cervical: No cervical adenopathy.  Skin:    Findings: No erythema or rash.  Neurological:     Mental Status: She is alert and oriented to person, place, and time.  Psychiatric:        Mood and Affect: Mood normal.        Behavior: Behavior normal.     BP 110/62    Temp 98.1 F (36.7 C) (Oral)    Resp 16    Ht 5\' 4"  (1.626 m)    Wt 164 lb 6.4 oz (74.6 kg)    LMP 02/14/2010    BMI 28.22 kg/m  Wt Readings from Last 3 Encounters:  02/26/19 164 lb 6.4 oz (74.6 kg)  01/10/19 161 lb (73 kg)  01/03/19 164 lb 12.8 oz (74.8 kg)     Lab Results  Component Value Date   WBC 5.1 08/08/2018   HGB 13.8 08/08/2018   HCT 43 08/08/2018   PLT 204 08/08/2018   GLUCOSE 91 02/01/2017   CHOL 208 (A) 08/08/2018   TRIG 118 08/08/2018   HDL 61 08/08/2018   LDLCALC 124 08/08/2018   ALT 12 08/08/2018   AST 14 08/08/2018   NA 142 08/08/2018   K 4.5 08/08/2018   CL 105 02/01/2017   CREATININE 0.8 08/08/2018   BUN 11 08/08/2018   CO2 29 02/01/2017   TSH 2.20 08/08/2018    Mm Diag Breast Tomo Bilateral  Result Date: 01/22/2019 CLINICAL DATA:  58 year old presenting with a palpable concern in the far POSTERIOR LEFT axilla which she initially noted approximately 1 month ago. Patient was treated with antibiotics for urinary tract infection and the palpable concern improved thereafter. Annual  mammographic evaluation. EXAM: DIGITAL DIAGNOSTIC BILATERAL MAMMOGRAM WITH CAD AND TOMO ULTRASOUND LEFT AXILLA COMPARISON:  Previous exam(s). ACR Breast Density Category b: There are scattered areas of fibroglandular density. FINDINGS: Tomosynthesis and synthesized full field CC and MLO views of both breasts were obtained. No findings suspicious for malignancy in either breast. Mammographic images were processed with CAD. On correlative physical exam, there is no palpable mass or lymphadenopathy in the UPPER LEFT axilla in the area of previous concern. Targeted LEFT axillary ultrasound is performed, showing normal tissue in the UPPER LEFT axilla in the area of prior palpable concern. No mass or pathologic lymphadenopathy is identified. IMPRESSION: 1. No mammographic evidence of malignancy involving either breast. 2. No pathologic LEFT axillary lymphadenopathy. RECOMMENDATION: Screening mammogram in one year.(Code:SM-B-01Y) I have discussed the findings and recommendations with the patient. If applicable, a reminder letter will be sent to the patient regarding the next appointment. BI-RADS CATEGORY  1: Negative. Electronically Signed   By: Evangeline Dakin M.D.   On: 01/22/2019 12:08   [place] Axilla Left  Result Date: 01/22/2019 CLINICAL DATA:  58 year old presenting with a palpable concern in the far POSTERIOR LEFT axilla which she initially noted approximately 1 month ago. Patient was treated with antibiotics for urinary tract infection and the palpable concern improved thereafter. Annual mammographic evaluation. EXAM: DIGITAL DIAGNOSTIC BILATERAL MAMMOGRAM WITH CAD AND TOMO ULTRASOUND LEFT AXILLA COMPARISON:  Previous exam(s). ACR Breast Density Category b: There are scattered areas of fibroglandular density. FINDINGS: Tomosynthesis and synthesized full field CC and MLO views of both breasts were obtained. No findings suspicious for malignancy in either breast. Mammographic images were processed  with CAD. On  correlative physical exam, there is no palpable mass or lymphadenopathy in the UPPER LEFT axilla in the area of previous concern. Targeted LEFT axillary ultrasound is performed, showing normal tissue in the UPPER LEFT axilla in the area of prior palpable concern. No mass or pathologic lymphadenopathy is identified. IMPRESSION: 1. No mammographic evidence of malignancy involving either breast. 2. No pathologic LEFT axillary lymphadenopathy. RECOMMENDATION: Screening mammogram in one year.(Code:SM-B-01Y) I have discussed the findings and recommendations with the patient. If applicable, a reminder letter will be sent to the patient regarding the next appointment. BI-RADS CATEGORY  1: Negative. Electronically Signed   By: Evangeline Dakin M.D.   On: 01/22/2019 12:08       Assessment & Plan:   Problem List Items Addressed This Visit    GERD (gastroesophageal reflux disease)    Controlled on current regimen.        Health care maintenance    Physical today 02/26/19.  PAP 02/01/17 - negative with negative HPV.  Follow up pap today 02/26/19.  Colonoscopy 05/2015 - diminutive polyp, diverticulosis and internal hemorrhoids.  IFOB given.        Raynauds phenomenon    On amlodipine.  Followed by Dr Jefm Bryant.        Scleroderma ([place])    Followed by Dr Jefm Bryant.  Stable.       Vaginal pain    Intermittent vaginal pain.  No abnormality noted on exam.  Discussed with her today.  Wants to follow.  Will notify me if persistent pain.        Vitamin D deficiency    Follow vitamin D level.         Other Visit Diagnoses    Routine general medical examination at a health care facility    -  Primary   Colon cancer screening       Relevant Orders   Fecal occult blood, imunochemical (Completed)   Cervical cancer screening       Relevant Orders   Cytology - PAP( [place]) (Completed)       Einar Pheasant, MD

## 2019-02-27 ENCOUNTER — Encounter: Payer: Self-pay | Admitting: Internal Medicine

## 2019-02-27 LAB — CYTOLOGY - PAP
Diagnosis: NEGATIVE
HPV: NOT DETECTED

## 2019-02-28 ENCOUNTER — Other Ambulatory Visit (INDEPENDENT_AMBULATORY_CARE_PROVIDER_SITE_OTHER): Payer: BC Managed Care – PPO

## 2019-02-28 ENCOUNTER — Encounter: Payer: Self-pay | Admitting: Internal Medicine

## 2019-02-28 DIAGNOSIS — Z1211 Encounter for screening for malignant neoplasm of colon: Secondary | ICD-10-CM | POA: Diagnosis not present

## 2019-02-28 LAB — FECAL OCCULT BLOOD, IMMUNOCHEMICAL: Fecal Occult Bld: NEGATIVE

## 2019-03-03 ENCOUNTER — Encounter: Payer: Self-pay | Admitting: Internal Medicine

## 2019-03-03 DIAGNOSIS — R102 Pelvic and perineal pain: Secondary | ICD-10-CM | POA: Insufficient documentation

## 2019-03-03 NOTE — Assessment & Plan Note (Signed)
Followed by Dr Kernodle.  Stable.  

## 2019-03-03 NOTE — Assessment & Plan Note (Signed)
Intermittent vaginal pain.  No abnormality noted on exam.  Discussed with her today.  Wants to follow.  Will notify me if persistent pain.

## 2019-03-03 NOTE — Assessment & Plan Note (Signed)
On amlodipine.  Followed by Dr Jefm Bryant.

## 2019-03-03 NOTE — Assessment & Plan Note (Signed)
Controlled on current regimen.   

## 2019-03-03 NOTE — Assessment & Plan Note (Signed)
Follow vitamin D level.  

## 2019-04-01 ENCOUNTER — Encounter

## 2019-04-01 ENCOUNTER — Ambulatory Visit: Payer: BC Managed Care – PPO | Admitting: Internal Medicine

## 2019-08-30 ENCOUNTER — Ambulatory Visit: Payer: BC Managed Care – PPO | Admitting: Internal Medicine

## 2019-09-06 ENCOUNTER — Encounter: Payer: Self-pay | Admitting: Internal Medicine

## 2019-09-06 ENCOUNTER — Ambulatory Visit (INDEPENDENT_AMBULATORY_CARE_PROVIDER_SITE_OTHER): Payer: BC Managed Care – PPO | Admitting: Internal Medicine

## 2019-09-06 ENCOUNTER — Other Ambulatory Visit: Payer: Self-pay | Admitting: *Deleted

## 2019-09-06 ENCOUNTER — Other Ambulatory Visit: Payer: Self-pay

## 2019-09-06 DIAGNOSIS — M349 Systemic sclerosis, unspecified: Secondary | ICD-10-CM

## 2019-09-06 DIAGNOSIS — R300 Dysuria: Secondary | ICD-10-CM

## 2019-09-06 DIAGNOSIS — N3000 Acute cystitis without hematuria: Secondary | ICD-10-CM | POA: Diagnosis not present

## 2019-09-06 DIAGNOSIS — Z13220 Encounter for screening for lipoid disorders: Secondary | ICD-10-CM | POA: Diagnosis not present

## 2019-09-06 LAB — POCT URINALYSIS DIP (MANUAL ENTRY)
Bilirubin, UA: NEGATIVE
Glucose, UA: NEGATIVE mg/dL
Ketones, POC UA: NEGATIVE mg/dL
Nitrite, UA: NEGATIVE
Protein Ur, POC: NEGATIVE mg/dL
Spec Grav, UA: >=1.03 — AB (ref 1.010–1.025)
Urobilinogen, UA: 0.2 E.U./dL
pH, UA: 5 (ref 5.0–8.0)

## 2019-09-06 MED ORDER — NITROFURANTOIN MONOHYD MACRO 100 MG PO CAPS: 100.0000 mg | ORAL_CAPSULE | Freq: Two times a day (BID) | ORAL | 0 refills | Status: DC

## 2019-09-06 MED ORDER — PHENAZOPYRIDINE HCL 200 MG PO TABS: 200.0000 mg | ORAL_TABLET | Freq: Three times a day (TID) | ORAL | 0 refills | Status: DC | PRN

## 2019-09-06 MED ORDER — FLUCONAZOLE 150 MG PO TABS: ORAL_TABLET | ORAL | 0 refills | Status: DC

## 2019-09-06 NOTE — Telephone Encounter (Signed)
Denise Arnold, see my chart note. Pt being worked in at [employer].  She is coming by to leave a urine.  Can place orders.  Thanks.

## 2019-09-06 NOTE — Progress Notes (Signed)
Patient ID: Denise Arnold, female   DOB: Mar 28, 1961, 59 y.o.   MRN: [id number]   Virtual Visit via videoNote  This visit type was conducted due to national recommendations for restrictions regarding the COVID-19 pandemic (e.g. social distancing).  This format is felt to be most appropriate for this patient at this time.  All issues noted in this document were discussed and addressed.  No physical exam was performed (except for noted visual exam findings with Video Visits).   I connected with Aylla Polyak by a video enabled telemedicine application and verified that I am speaking with the correct person using two identifiers. Location patient: home Location provider: work Persons participating in the virtual visit: patient, provider  The limitations, risks, security and privacy concerns of performing an evaluation and management service by video and the availability of in person appointments have been discussed. The patient expressed understanding and agreed to proceed.   Reason for visit: work in appt  HPI: Work in appt with concerns over possible UTI.  Reports noticing increased urgency and dysuria.  Feels similar to UTI.  No nausea or vomiting.  Eating and drinking.  No back pain or abdominal reported.  No fever.     ROS: See pertinent positives and negatives per HPI.  Past Medical History:  Diagnosis Date  . Arthritis   . GERD (gastroesophageal reflux disease)   . History of endoscopy 04/05/2013   upper  . Menorrhagia   . 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 Scleroderma (HCC)    positive FANA, positive SCL-70 abs, raynauds, mild pulmonary hypertension, normal DLCO  . Tricuspid regurgitation   . Vitamin D deficiency     Past Surgical History:  Procedure Laterality Date  . CESAREAN SECTION  1994  . COLONOSCOPY WITH PROPOFOL N/A 05/29/2015   Procedure: COLONOSCOPY WITH PROPOFOL;  Surgeon: Manya Silvas, MD;  Location: [facility] ENDOSCOPY;  Service: Endoscopy;  Laterality: N/A;  . DILATION  AND CURETTAGE OF UTERUS  1992  . DILATION AND CURETTAGE OF UTERUS  1993  . DILATION AND CURETTAGE OF UTERUS  2011  . VEIN SURGERY      Family History  Problem Relation Age of Onset  . Heart disease Father        myocardial infarction  . Arthritis/Rheumatoid Father   . Hypertension Father   . Hyperlipidemia Mother   . Breast cancer Maternal Grandmother   . Colon cancer Neg Hx     SOCIAL HX: reviewed.    Current Outpatient Medications:  .  amLODipine (NORVASC) 10 MG tablet, Take 10 mg by mouth daily., Disp: , Rfl: 1 .  azelastine (ASTELIN) 0.1 % nasal spray, Place 1 spray into both nostrils 2 (two) times daily. Use in each nostril as directed, Disp: 30 mL, Rfl: 0 .  Biotin 5000 MCG TABS, , Disp: , Rfl:  .  Calcium-Magnesium-Vitamin D (CALCIUM 500 PO), Take by mouth., Disp: , Rfl:  .  cholecalciferol (VITAMIN D) 1000 UNITS tablet, Take 1,000 Units by mouth daily., Disp: , Rfl:  .  fish oil-omega-3 fatty acids 1000 MG capsule, Take 1 g by mouth daily., Disp: , Rfl:  .  fluconazole (DIFLUCAN) 150 MG tablet, Take one tablet x 1 and then if persistent symptoms may repeat x 1 in 3 days., Disp: 2 tablet, Rfl: 0 .  glucosamine-chondroitin 500-400 MG tablet, Take 1 tablet by mouth daily., Disp: , Rfl:  .  MISC NATURAL PRODUCTS PO, Herbal Supplement, Disp: , Rfl:  .  Multiple Vitamin (MULTIVITAMIN)  tablet, Take 1 tablet by mouth daily., Disp: , Rfl:  .  nitrofurantoin, macrocrystal-monohydrate, (MACROBID) 100 MG capsule, Take 1 capsule (100 mg total) by mouth 2 (two) times daily., Disp: 10 capsule, Rfl: 0 .  nitroGLYCERIN (NITROGLYN) 2 % ointment, Apply 0.5 inches topically 3 (three) times daily as needed. , Disp: , Rfl:  .  omeprazole (PRILOSEC) 20 MG capsule, Take 20 mg by mouth 2 (two) times daily. , Disp: , Rfl:  .  phenazopyridine (PYRIDIUM) 200 MG tablet, Take 1 tablet (200 mg total) by mouth 3 (three) times daily as needed for pain., Disp: 6 tablet, Rfl: 0 .  Probiotic Product  (PROBIOTIC PO), Take by mouth., Disp: , Rfl:   EXAM:  GENERAL: alert, oriented, appears well and in no acute distress  HEENT: atraumatic, conjunttiva clear, no obvious abnormalities on inspection of external nose and ears  NECK: normal movements of the head and neck  LUNGS: on inspection no signs of respiratory distress, breathing rate appears normal, no obvious gross SOB, gasping or wheezing  CV: no obvious cyanosis  PSYCH/NEURO: pleasant and cooperative, no obvious depression or anxiety, speech and thought processing grossly intact  ASSESSMENT AND PLAN:  Discussed the following assessment and plan:  UTI (urinary tract infection) Symptoms appear to be c/w uti. Urine dip - trace leukocytes and trace blood.  Given symptoms and urine dip, will treat for UTI with macrobid.  Pyridium to help with discomfort.  Culture sent.  Will notify once culture results available.  Will need f/u urinalysis to confirm blood clears.    Scleroderma Followed by Dr Jefm Bryant.  Has regular f/u appt with me soon.  Check routine labs.     Meds ordered this encounter  Medications  . nitrofurantoin, macrocrystal-monohydrate, (MACROBID) 100 MG capsule    Sig: Take 1 capsule (100 mg total) by mouth 2 (two) times daily.    Dispense:  10 capsule    Refill:  0  . phenazopyridine (PYRIDIUM) 200 MG tablet    Sig: Take 1 tablet (200 mg total) by mouth 3 (three) times daily as needed for pain.    Dispense:  6 tablet    Refill:  0  . fluconazole (DIFLUCAN) 150 MG tablet    Sig: Take one tablet x 1 and then if persistent symptoms may repeat x 1 in 3 days.    Dispense:  2 tablet    Refill:  0     I discussed the assessment and treatment plan with the patient. The patient was provided an opportunity to ask questions and all were answered. The patient agreed with the plan and demonstrated an understanding of the instructions.   The patient was advised to call back or seek an in-person evaluation if the symptoms  worsen or if the condition fails to improve as anticipated.   Einar Pheasant, MD

## 2019-09-06 NOTE — Addendum Note (Signed)
Addended by: Leeanne Rio on: 09/06/2019 01:14 PM   Modules accepted: Orders

## 2019-09-06 NOTE — Telephone Encounter (Signed)
Urine orders were already placed

## 2019-09-06 NOTE — Addendum Note (Signed)
Addended by: Leeanne Rio on: 09/06/2019 01:15 PM   Modules accepted: Orders

## 2019-09-07 LAB — URINALYSIS, MICROSCOPIC ONLY
Bacteria, UA: NONE SEEN
Casts: NONE SEEN /lpf
Epithelial Cells (non renal): NONE SEEN /hpf (ref 0–10)

## 2019-09-08 LAB — URINE CULTURE

## 2019-09-09 ENCOUNTER — Other Ambulatory Visit: Payer: Self-pay | Admitting: Internal Medicine

## 2019-09-09 DIAGNOSIS — R319 Hematuria, unspecified: Secondary | ICD-10-CM

## 2019-09-09 NOTE — Progress Notes (Signed)
Order placed for f/u urinalysis 

## 2019-09-15 ENCOUNTER — Encounter: Payer: Self-pay | Admitting: Internal Medicine

## 2019-09-15 ENCOUNTER — Ambulatory Visit: Payer: BC Managed Care – PPO | Attending: Internal Medicine

## 2019-09-15 DIAGNOSIS — Z23 Encounter for immunization: Secondary | ICD-10-CM | POA: Insufficient documentation

## 2019-09-15 NOTE — Assessment & Plan Note (Signed)
Symptoms appear to be c/w uti. Urine dip - trace leukocytes and trace blood.  Given symptoms and urine dip, will treat for UTI with macrobid.  Pyridium to help with discomfort.  Culture sent.  Will notify once culture results available.  Will need f/u urinalysis to confirm blood clears.

## 2019-09-15 NOTE — Assessment & Plan Note (Signed)
Followed by Dr Jefm Bryant.  Has regular f/u appt with me soon.  Check routine labs.

## 2019-09-15 NOTE — Progress Notes (Signed)
   Covid-19 Vaccination Clinic  Name:  LASHAWNNA BACHELLER    MRN: [id number] DOB: Nov 14, 1960  09/15/2019  Ms. Viglione was observed post Covid-19 immunization for 15 minutes without incidence. She was provided with Vaccine Information Sheet and instruction to access the V-Safe system.   Ms. Yearby was instructed to call 911 with any severe reactions post vaccine: Marland Kitchen Difficulty breathing  . Swelling of your face and throat  . A fast heartbeat  . A bad rash all over your body  . Dizziness and weakness    Immunizations Administered    Name Date Dose VIS Date Route   Pfizer COVID-19 Vaccine 09/15/2019  2:16 PM 0.3 mL 06/28/2019 Intramuscular   Manufacturer: [place]   Lot: [id number]   [place]: [id number]

## 2019-09-20 ENCOUNTER — Other Ambulatory Visit (INDEPENDENT_AMBULATORY_CARE_PROVIDER_SITE_OTHER): Payer: BC Managed Care – PPO

## 2019-09-20 ENCOUNTER — Other Ambulatory Visit: Payer: Self-pay

## 2019-09-20 DIAGNOSIS — Z13220 Encounter for screening for lipoid disorders: Secondary | ICD-10-CM

## 2019-09-20 DIAGNOSIS — M349 Systemic sclerosis, unspecified: Secondary | ICD-10-CM | POA: Diagnosis not present

## 2019-09-20 DIAGNOSIS — R319 Hematuria, unspecified: Secondary | ICD-10-CM | POA: Diagnosis not present

## 2019-09-20 LAB — CBC WITH DIFFERENTIAL/PLATELET
Basophils Absolute: 0 10*3/uL (ref 0.0–0.1)
Basophils Relative: 0.6 % (ref 0.0–3.0)
Eosinophils Absolute: 0.1 10*3/uL (ref 0.0–0.7)
Eosinophils Relative: 2.5 % (ref 0.0–5.0)
HCT: 39 % (ref 36.0–46.0)
Hemoglobin: 13.2 g/dL (ref 12.0–15.0)
Lymphocytes Relative: 28.7 % (ref 12.0–46.0)
Lymphs Abs: 1.3 10*3/uL (ref 0.7–4.0)
MCHC: 33.7 g/dL (ref 30.0–36.0)
MCV: 90.9 fl (ref 78.0–100.0)
Monocytes Absolute: 0.4 10*3/uL (ref 0.1–1.0)
Monocytes Relative: 9.3 % (ref 3.0–12.0)
Neutro Abs: 2.7 10*3/uL (ref 1.4–7.7)
Neutrophils Relative %: 58.9 % (ref 43.0–77.0)
Platelets: 218 10*3/uL (ref 150.0–400.0)
RBC: 4.29 Mil/uL (ref 3.87–5.11)
RDW: 13.8 % (ref 11.5–15.5)
WBC: 4.5 10*3/uL (ref 4.0–10.5)

## 2019-09-20 LAB — URINALYSIS, ROUTINE W REFLEX MICROSCOPIC
Bilirubin Urine: NEGATIVE
Hgb urine dipstick: NEGATIVE
Ketones, ur: NEGATIVE
Leukocytes,Ua: NEGATIVE
Nitrite: NEGATIVE
Specific Gravity, Urine: 1.025 (ref 1.000–1.030)
Total Protein, Urine: NEGATIVE
Urine Glucose: NEGATIVE
Urobilinogen, UA: 0.2 (ref 0.0–1.0)
pH: 5.5 (ref 5.0–8.0)

## 2019-09-20 LAB — COMPREHENSIVE METABOLIC PANEL
ALT: 10 U/L (ref 0–35)
AST: 17 U/L (ref 0–37)
Albumin: 3.8 g/dL (ref 3.5–5.2)
Alkaline Phosphatase: 54 U/L (ref 39–117)
BUN: 12 mg/dL (ref 6–23)
CO2: 27 mEq/L (ref 19–32)
Calcium: 9.4 mg/dL (ref 8.4–10.5)
Chloride: 107 mEq/L (ref 96–112)
Creatinine, Ser: 0.83 mg/dL (ref 0.40–1.20)
GFR: 70.66 mL/min (ref >60.00)
Glucose, Bld: 91 mg/dL (ref 70–99)
Potassium: 4.4 mEq/L (ref 3.5–5.1)
Sodium: 141 mEq/L (ref 135–145)
Total Bilirubin: 0.5 mg/dL (ref 0.2–1.2)
Total Protein: 6.6 g/dL (ref 6.0–8.3)

## 2019-09-20 LAB — LIPID PANEL
Cholesterol: 180 mg/dL (ref 0–200)
HDL: 57.4 mg/dL (ref >39.00)
LDL Cholesterol: 103 mg/dL — ABNORMAL HIGH (ref 0–99)
NonHDL: 122.72
Total CHOL/HDL Ratio: 3
Triglycerides: 100 mg/dL (ref 0.0–149.0)
VLDL: 20 mg/dL (ref 0.0–40.0)

## 2019-09-20 LAB — TSH: TSH: 1.65 u[IU]/mL (ref 0.35–4.50)

## 2019-09-23 ENCOUNTER — Other Ambulatory Visit: Payer: Self-pay

## 2019-09-23 ENCOUNTER — Ambulatory Visit (INDEPENDENT_AMBULATORY_CARE_PROVIDER_SITE_OTHER): Payer: BC Managed Care – PPO | Admitting: Internal Medicine

## 2019-09-23 DIAGNOSIS — Z860100 Personal history of colon polyps, unspecified: Secondary | ICD-10-CM

## 2019-09-23 DIAGNOSIS — M349 Systemic sclerosis, unspecified: Secondary | ICD-10-CM | POA: Diagnosis not present

## 2019-09-23 DIAGNOSIS — E559 Vitamin D deficiency, unspecified: Secondary | ICD-10-CM

## 2019-09-23 DIAGNOSIS — K2100 Gastro-esophageal reflux disease with esophagitis, without bleeding: Secondary | ICD-10-CM

## 2019-09-23 DIAGNOSIS — I7300 Raynaud's syndrome without gangrene: Secondary | ICD-10-CM

## 2019-09-23 MED ORDER — AZELASTINE HCL 0.1 % NA SOLN: 1.0000 | Freq: Two times a day (BID) | NASAL | 0 refills | Status: DC

## 2019-09-23 NOTE — Progress Notes (Signed)
Patient ID: Denise Arnold, female   DOB: January 21, 1961, 59 y.o.   MRN: [id number]   Subjective:    Patient ID: Denise Arnold, female    DOB: 12/07/1960, 59 y.o.   MRN: [id number]  HPI  Patient here for a scheduled follow up.  She reports she is doing relatively well.  Handling stress.  Stays active.  Exercises.  No chest pain or sob reported.  No abdominal pain or bowel change reported.  Some allergies.  Discussed astelin.  Discussed swallowing.  Plans to f/u with Dr Jefm Bryant - swallowing evaluation.  Discussed labs.    Past Medical History:  Diagnosis Date  . Arthritis   . GERD (gastroesophageal reflux disease)   . History of endoscopy 04/05/2013   upper  . Menorrhagia   . 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 Scleroderma (HCC)    positive FANA, positive SCL-70 abs, raynauds, mild pulmonary hypertension, normal DLCO  . Tricuspid regurgitation   . Vitamin D deficiency    Past Surgical History:  Procedure Laterality Date  . CESAREAN SECTION  1994  . COLONOSCOPY WITH PROPOFOL N/A 05/29/2015   Procedure: COLONOSCOPY WITH PROPOFOL;  Surgeon: Manya Silvas, MD;  Location: [facility] ENDOSCOPY;  Service: Endoscopy;  Laterality: N/A;  . DILATION AND CURETTAGE OF UTERUS  1992  . DILATION AND CURETTAGE OF UTERUS  1993  . DILATION AND CURETTAGE OF UTERUS  2011  . VEIN SURGERY     Family History  Problem Relation Age of Onset  . Heart disease Father        myocardial infarction  . Arthritis/Rheumatoid Father   . Hypertension Father   . Hyperlipidemia Mother   . Breast cancer Maternal Grandmother   . Colon cancer Neg Hx    Social History   Socioeconomic History  . Marital status: Married    Spouse name: Not on file  . Number of children: 1  . Years of education: Not on file  . Highest education level: Not on file  Occupational History  . Not on file  Tobacco Use  . Smoking status: Never Smoker  . Smokeless tobacco: Never Used  Substance and Sexual Activity  . Alcohol use: No    Alcohol/week:  0.0 standard drinks  . Drug use: No  . Sexual activity: Not on file  Other Topics Concern  . Not on file  Social History Narrative  . Not on file   Social Determinants of Health   Financial Resource Strain:   . Difficulty of Paying Living Expenses:   Food Insecurity:   . Worried About Charity fundraiser in the Last Year:   . Arboriculturist in the Last Year:   Transportation Needs:   . Film/video editor (Medical):   Marland Kitchen Lack of Transportation (Non-Medical):   Physical Activity:   . Days of Exercise per Week:   . Minutes of Exercise per Session:   Stress:   . Feeling of Stress :   Social Connections:   . Frequency of Communication with Friends and Family:   . Frequency of Social Gatherings with Friends and Family:   . Attends Religious Services:   . Active Member of Clubs or Organizations:   . Attends Archivist Meetings:   Marland Kitchen Marital Status:     Outpatient Encounter Medications as of 09/23/2019  Medication Sig  . amLODipine (NORVASC) 10 MG tablet Take 10 mg by mouth daily.  Marland Kitchen azelastine (ASTELIN) 0.1 % nasal spray Place 1 spray into  both nostrils 2 (two) times daily. Use in each nostril as directed  . Biotin 5000 MCG TABS   . Calcium-Magnesium-Vitamin D (CALCIUM 500 PO) Take by mouth.  . cholecalciferol (VITAMIN D) 1000 UNITS tablet Take 1,000 Units by mouth daily.  . fish oil-omega-3 fatty acids 1000 MG capsule Take 1 g by mouth daily.  Marland Kitchen glucosamine-chondroitin 500-400 MG tablet Take 1 tablet by mouth daily.  Marland Kitchen MISC NATURAL PRODUCTS PO Herbal Supplement  . Multiple Vitamin (MULTIVITAMIN) tablet Take 1 tablet by mouth daily.  . nitroGLYCERIN (NITROGLYN) 2 % ointment Apply 0.5 inches topically 3 (three) times daily as needed.   Marland Kitchen omeprazole (PRILOSEC) 20 MG capsule Take 20 mg by mouth 2 (two) times daily.   . Probiotic Product (PROBIOTIC PO) Take by mouth.  . [DISCONTINUED] azelastine (ASTELIN) 0.1 % nasal spray Place 1 spray into both nostrils 2 (two) times  daily. Use in each nostril as directed  . [DISCONTINUED] fluconazole (DIFLUCAN) 150 MG tablet Take one tablet x 1 and then if persistent symptoms may repeat x 1 in 3 days.  . [DISCONTINUED] nitrofurantoin, macrocrystal-monohydrate, (MACROBID) 100 MG capsule Take 1 capsule (100 mg total) by mouth 2 (two) times daily.  . [DISCONTINUED] phenazopyridine (PYRIDIUM) 200 MG tablet Take 1 tablet (200 mg total) by mouth 3 (three) times daily as needed for pain.   No facility-administered encounter medications on file as of 09/23/2019.    Review of Systems  Constitutional: Negative for appetite change and unexpected weight change.  HENT: Negative for sinus pressure and sore throat.   Respiratory: Negative for cough, chest tightness and shortness of breath.   Cardiovascular: Negative for chest pain, palpitations and leg swelling.  Gastrointestinal: Negative for abdominal pain, diarrhea, nausea and vomiting.  Genitourinary: Negative for difficulty urinating and dysuria.  Musculoskeletal: Negative for joint swelling and myalgias.  Skin: Negative for color change and rash.  Neurological: Negative for dizziness, light-headedness and headaches.  Psychiatric/Behavioral: Negative for agitation and dysphoric mood.       Objective:    Physical Exam Constitutional:      General: She is not in acute distress.    Appearance: Normal appearance.  HENT:     Head: Normocephalic and atraumatic.     Right Ear: External ear normal.     Left Ear: External ear normal.  Eyes:     General: No scleral icterus.       Right eye: No discharge.        Left eye: No discharge.     Conjunctiva/sclera: Conjunctivae normal.  Neck:     Thyroid: No thyromegaly.  Cardiovascular:     Rate and Rhythm: Normal rate and regular rhythm.  Pulmonary:     Effort: No respiratory distress.     Breath sounds: Normal breath sounds. No wheezing.  Abdominal:     General: Bowel sounds are normal.     Palpations: Abdomen is soft.      Tenderness: There is no abdominal tenderness.  Musculoskeletal:        General: No swelling or tenderness.     Cervical back: Neck supple. No tenderness.  Lymphadenopathy:     Cervical: No cervical adenopathy.  Skin:    Findings: No erythema or rash.  Neurological:     Mental Status: She is alert.  Psychiatric:        Mood and Affect: Mood normal.        Behavior: Behavior normal.     BP 108/68   Pulse 87  Temp 97.6 F (36.4 C)   Resp 16   Wt 167 lb 12.8 oz (76.1 kg)   LMP 02/14/2010   SpO2 99%   BMI 28.80 kg/m  Wt Readings from Last 3 Encounters:  09/23/19 167 lb 12.8 oz (76.1 kg)  02/26/19 164 lb 6.4 oz (74.6 kg)  01/10/19 161 lb (73 kg)     Lab Results  Component Value Date   WBC 4.5 09/20/2019   HGB 13.2 09/20/2019   HCT 39.0 09/20/2019   PLT 218.0 09/20/2019   GLUCOSE 91 09/20/2019   CHOL 180 09/20/2019   TRIG 100.0 09/20/2019   HDL 57.40 09/20/2019   LDLCALC 103 (H) 09/20/2019   ALT 10 09/20/2019   AST 17 09/20/2019   NA 141 09/20/2019   K 4.4 09/20/2019   CL 107 09/20/2019   CREATININE 0.83 09/20/2019   BUN 12 09/20/2019   CO2 27 09/20/2019   TSH 1.65 09/20/2019       Assessment & Plan:   Problem List Items Addressed This Visit    GERD (gastroesophageal reflux disease)    Acid reflux controlled on omeprazole.  Discussed swallowing.  Plans to f/u with Dr Jefm Bryant - swallowing evaluation.        History of colonic polyps    Colonoscopy 05/2015 - diminutive polyp.        Raynauds phenomenon    On amlodipine.  Sees Dr Jefm Bryant.       Scleroderma ([place])    Followed by Dr Jefm Bryant. Stable.  Follow.       Relevant Orders   Comprehensive metabolic panel   Lipid panel   Vitamin D deficiency    Follow vitamin D level.           Einar Pheasant, MD

## 2019-09-29 ENCOUNTER — Encounter: Payer: Self-pay | Admitting: Internal Medicine

## 2019-09-29 NOTE — Assessment & Plan Note (Signed)
Follow vitamin D level.  

## 2019-09-29 NOTE — Assessment & Plan Note (Signed)
On amlodipine.  Sees Dr Jefm Bryant.

## 2019-09-29 NOTE — Assessment & Plan Note (Signed)
Acid reflux controlled on omeprazole.  Discussed swallowing.  Plans to f/u with Dr Jefm Bryant - swallowing evaluation.

## 2019-09-29 NOTE — Assessment & Plan Note (Signed)
Colonoscopy 05/2015 - diminutive polyp.

## 2019-09-29 NOTE — Assessment & Plan Note (Signed)
Followed by Dr Jefm Bryant. Stable.  Follow.

## 2019-10-09 ENCOUNTER — Ambulatory Visit: Payer: BC Managed Care – PPO | Attending: Internal Medicine

## 2019-10-09 DIAGNOSIS — Z23 Encounter for immunization: Secondary | ICD-10-CM

## 2019-10-09 NOTE — Progress Notes (Signed)
   Covid-19 Vaccination Clinic  Name:  Denise Arnold    MRN: [id number] DOB: 03-Nov-1960  10/09/2019  [name] was observed post Covid-19 immunization for 15 minutes without incident. She was provided with Vaccine Information Sheet and instruction to access the V-Safe system.   [name] was instructed to call 911 with any severe reactions post vaccine: Marland Kitchen Difficulty breathing  . Swelling of face and throat  . A fast heartbeat  . A bad rash all over body  . Dizziness and weakness   Immunizations Administered    Name Date Dose VIS Date Route   Pfizer COVID-19 Vaccine 10/09/2019 12:56 PM 0.3 mL 06/28/2019 Intramuscular   Manufacturer: [place]   Lot: [id number]   [place]: [id number]

## 2019-10-16 ENCOUNTER — Other Ambulatory Visit: Payer: Self-pay | Admitting: Internal Medicine

## 2019-10-31 ENCOUNTER — Other Ambulatory Visit: Payer: Self-pay | Admitting: Specialist

## 2019-10-31 DIAGNOSIS — R1310 Dysphagia, unspecified: Secondary | ICD-10-CM

## 2019-11-15 ENCOUNTER — Ambulatory Visit
Admission: RE | Admit: 2019-11-15 | Discharge: 2019-11-15 | Disposition: A | Discharge status: Home / Self Care | Payer: BC Managed Care – PPO | Source: Ambulatory Visit | Attending: Specialist | Admitting: Specialist

## 2019-11-15 ENCOUNTER — Other Ambulatory Visit: Payer: Self-pay

## 2019-11-15 DIAGNOSIS — R1310 Dysphagia, unspecified: Secondary | ICD-10-CM

## 2019-11-15 NOTE — Therapy (Signed)
[facility] [place], [place], [address] Phone: [phone]   Fax:     Modified Barium Swallow  Patient Details  Name: Denise Arnold MRN: [id number] Date of Birth: 28-Nov-1960 No data recorded  Encounter Date: 11/15/2019  End of Session - 11/15/19 1519    Visit Number  1    Number of Visits  1    Date for SLP Re-Evaluation  11/15/19    SLP Start Time  55    SLP Stop Time   67    SLP Time Calculation (min)  20 min    Activity Tolerance  Patient tolerated treatment well       Past Medical History:  Diagnosis Date  . Arthritis   . GERD (gastroesophageal reflux disease)   . History of endoscopy 04/05/2013   upper  . Menorrhagia   . 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 Scleroderma (HCC)    positive FANA, positive SCL-70 abs, raynauds, mild pulmonary hypertension, normal DLCO  . Tricuspid regurgitation   . Vitamin D deficiency     Past Surgical History:  Procedure Laterality Date  . CESAREAN SECTION  1994  . COLONOSCOPY WITH PROPOFOL N/A 05/29/2015   Procedure: COLONOSCOPY WITH PROPOFOL;  Surgeon: Manya Silvas, MD;  Location: [facility] ENDOSCOPY;  Service: Endoscopy;  Laterality: N/A;  . DILATION AND CURETTAGE OF UTERUS  1992  . DILATION AND CURETTAGE OF UTERUS  1993  . DILATION AND CURETTAGE OF UTERUS  2011  . VEIN SURGERY      There were no vitals filed for this visit.       General - 11/15/19 1515      General Information   Date of Onset  08/14/19    HPI  Denise Arnold is a 59 y.o. female with hx of scleroderma. Pt reports that a "Tylenol got stuck" in her throat ~ 8 weeks ago. No history of dysphagia in chart.    Type of Study  MBS-Modified Barium Swallow Study    Previous Swallow Assessment  none in chart    Diet Prior to this Study  Regular;Thin liquids    Temperature Spikes Noted  No    Respiratory Status  Room air    History of Recent Intubation  No    Behavior/Cognition   Alert;Cooperative;Pleasant mood    Oral Cavity Assessment  Within Functional Limits    Oral Care Completed by SLP  No    Oral Cavity - Dentition  Adequate natural dentition    Vision  Functional for self feeding    Self-Feeding Abilities  Able to feed self    Patient Positioning  Upright in chair    Baseline Vocal Quality  Normal    Volitional Cough  Strong    Volitional Swallow  Able to elicit    Anatomy  Within functional limits    Pharyngeal Secretions  Not observed secondary MBS         Oral Preparation/Oral Phase - 11/15/19 1518      Oral Preparation/Oral Phase   Oral Phase  Within functional limits      Electrical stimulation - Oral Phase   Was Electrical Stimulation Used  No       Pharyngeal Phase - 11/15/19 1518      Pharyngeal Phase   Pharyngeal Phase  Within functional limits      Electrical Stimulation - Pharyngeal Phase   Was Electrical Stimulation Used  No  Cricopharyngeal Phase - 11/15/19 1518      Cervical Esophageal Phase   Cervical Esophageal Phase  Within functional limits               Plan - 11/15/19 1519    Clinical Impression Statement  Pt presents with functional oropharyngeal abilities with no evidence of dysphagia or aspiration when consuming regular diet textures, thin liquids via cup or straw and barium tablet whole with thin liquids.    Consulted and Agree with Plan of Care  Patient       NO follow up indicated.    Recommendations/Treatment - 11/15/19 1518      Swallow Evaluation Recommendations   SLP Diet Recommendations  Age appropriate regular;Thin    Liquid Administration via  Cup;Straw    Medication Administration  Whole meds with liquid    Supervision  Patient able to self feed    Compensations  Minimize environmental distractions;Slow rate;Small sips/bites    Postural Changes  Seated upright at 90 degrees       Prognosis - 11/15/19 1518      Individuals Consulted   Consulted and Agree with Results and  Recommendations  Patient    Report Sent to   Referring physician       Problem List Patient Active Problem List   Diagnosis Date Noted  . Vaginal pain 03/03/2019  . Axillary fullness 01/13/2019  . UTI (urinary tract infection) 10/27/2018  . Cellulitis 01/16/2018  . Health care maintenance 01/25/2015  . History of colonic polyps 04/22/2013  . Vitamin D deficiency 01/15/2013  . Degenerative cervical disc 01/15/2013  . GERD (gastroesophageal reflux disease) 01/15/2013  . Scleroderma ([place]) 07/17/2012  . Raynauds phenomenon 07/17/2012   Honest Vanleer B. Rutherford Nail M.S., CCC-SLP, [place] Office [phone]   Stormy Fabian 11/15/2019, 3:22 PM  [facility] [place], [place], [address] Phone: [phone]   Fax:     Name: Denise Arnold MRN: [id number] Date of Birth: 10/02/1960

## 2019-12-09 ENCOUNTER — Other Ambulatory Visit: Payer: Self-pay | Admitting: Internal Medicine

## 2019-12-09 DIAGNOSIS — Z1231 Encounter for screening mammogram for malignant neoplasm of breast: Secondary | ICD-10-CM

## 2019-12-30 ENCOUNTER — Ambulatory Visit (INDEPENDENT_AMBULATORY_CARE_PROVIDER_SITE_OTHER)
Admission: RE | Admit: 2019-12-30 | Discharge: 2019-12-30 | Disposition: A | Discharge status: Home / Self Care | Payer: BC Managed Care – PPO | Source: Ambulatory Visit

## 2019-12-30 DIAGNOSIS — R300 Dysuria: Secondary | ICD-10-CM

## 2019-12-30 MED ORDER — NITROFURANTOIN MONOHYD MACRO 100 MG PO CAPS: 100.0000 mg | ORAL_CAPSULE | Freq: Two times a day (BID) | ORAL | 0 refills | Status: AC

## 2019-12-30 MED ORDER — FLUCONAZOLE 150 MG PO TABS: 150.0000 mg | ORAL_TABLET | Freq: Every day | ORAL | 0 refills | Status: DC

## 2019-12-30 NOTE — Discharge Instructions (Signed)
Take the Macrobid and Diflucan as directed.    Follow up with your primary care provider or come here to be seen in person if your symptoms are not improving.

## 2019-12-30 NOTE — ED Provider Notes (Signed)
Virtual Visit via Video Note:  Denise Arnold  initiated request for Telemedicine visit with [facility] Urgent Care team. I connected with Denise Arnold  on 12/30/2019 at 1:14 PM  for a synchronized telemedicine visit using a video enabled HIPPA compliant telemedicine application. I verified that I am speaking with Denise Arnold  using two identifiers. [name], NP  was physically located in a [facility] Urgent care site and [name] was located at a different location.   The limitations of evaluation and management by telemedicine as well as the availability of in-person appointments were discussed. Patient was informed that she  may incur a bill ( including co-pay) for this virtual visit encounter. Denise Arnold  expressed understanding and gave verbal consent to proceed with virtual visit.     History of Present Illness:Denise Arnold  is a 59 y.o. female presents for evaluation of dysuria and frequency x 3 days.  She denies fever, chills, abdominal pain, back pain, vaginal discharge, pelvic pain, or other symptoms.  Her symptoms feel like previous UTIs.  She states she usually has to have Diflucan for yeast when taking an antibiotic.  No treatment attempted at home.    Allergies  Allergen Reactions  . Sulfa Antibiotics Nausea And Vomiting  . Penicillins Rash     Past Medical History:  Diagnosis Date  . Arthritis   . GERD (gastroesophageal reflux disease)   . History of endoscopy 04/05/2013   upper  . Menorrhagia   . 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 Scleroderma (HCC)    positive FANA, positive SCL-70 abs, raynauds, mild pulmonary hypertension, normal DLCO  . Tricuspid regurgitation   . Vitamin D deficiency      Social History   Tobacco Use  . Smoking status: Never Smoker  . Smokeless tobacco: Never Used  Substance Use Topics  . Alcohol use: No    Alcohol/week: 0.0 standard drinks  . Drug use: No    ROS: as stated in HPI.  All other systems reviewed and negative.        Observations/Objective: Physical Exam  VITALS: Patient denies fever. GENERAL: Alert, appears well and in no acute distress. HEENT: Atraumatic. Oral mucosa appears moist. NECK: Normal movements of the head and neck. CARDIOPULMONARY: No increased WOB. Speaking in clear sentences. I:E ratio WNL.  MS: Moves all visible extremities without noticeable abnormality. PSYCH: Pleasant and cooperative, well-groomed. Speech normal rate and rhythm. Affect is appropriate. Insight and judgement are appropriate. Attention is focused, linear, and appropriate.  NEURO: CN grossly intact. Oriented as arrived to appointment on time with no prompting. Moves both UE equally.  SKIN: No obvious lesions, wounds, erythema, or cyanosis noted on face or hands.   Assessment and Plan:    ICD-10-CM   1. Dysuria  R30.0        Follow Up Instructions: Treating with Macrobid and Diflucan.  Instructed patient to follow-up with her PCP or come here to be seen in person if her symptoms are not improving.  Patient agrees to plan of care.      I discussed the assessment and treatment plan with the patient. The patient was provided an opportunity to ask questions and all were answered. The patient agreed with the plan and demonstrated an understanding of the instructions.   The patient was advised to call back or seek an in-person evaluation if the symptoms worsen or if the condition fails to improve as anticipated.  [name], NP  12/30/2019 1:14 PM         [name], NP 12/30/19 1314

## 2020-01-23 ENCOUNTER — Ambulatory Visit
Admission: RE | Admit: 2020-01-23 | Discharge: 2020-01-23 | Disposition: A | Discharge status: Home / Self Care | Payer: BC Managed Care – PPO | Source: Ambulatory Visit | Attending: Internal Medicine | Admitting: Internal Medicine

## 2020-01-23 DIAGNOSIS — Z1231 Encounter for screening mammogram for malignant neoplasm of breast: Secondary | ICD-10-CM

## 2020-03-25 ENCOUNTER — Other Ambulatory Visit: Payer: BC Managed Care – PPO

## 2020-03-27 ENCOUNTER — Encounter: Payer: BC Managed Care – PPO | Admitting: Internal Medicine

## 2020-03-30 ENCOUNTER — Other Ambulatory Visit: Payer: Self-pay

## 2020-03-30 ENCOUNTER — Other Ambulatory Visit (INDEPENDENT_AMBULATORY_CARE_PROVIDER_SITE_OTHER): Payer: BC Managed Care – PPO

## 2020-03-30 DIAGNOSIS — M349 Systemic sclerosis, unspecified: Secondary | ICD-10-CM | POA: Diagnosis not present

## 2020-03-30 LAB — COMPREHENSIVE METABOLIC PANEL
ALT: 11 U/L (ref 0–35)
AST: 17 U/L (ref 0–37)
Albumin: 4 g/dL (ref 3.5–5.2)
Alkaline Phosphatase: 51 U/L (ref 39–117)
BUN: 13 mg/dL (ref 6–23)
CO2: 28 mEq/L (ref 19–32)
Calcium: 9.2 mg/dL (ref 8.4–10.5)
Chloride: 106 mEq/L (ref 96–112)
Creatinine, Ser: 0.86 mg/dL (ref 0.40–1.20)
GFR: 67.7 mL/min (ref >60.00)
Glucose, Bld: 85 mg/dL (ref 70–99)
Potassium: 4.2 mEq/L (ref 3.5–5.1)
Sodium: 141 mEq/L (ref 135–145)
Total Bilirubin: 0.8 mg/dL (ref 0.2–1.2)
Total Protein: 6.3 g/dL (ref 6.0–8.3)

## 2020-03-30 LAB — LIPID PANEL
Cholesterol: 189 mg/dL (ref 0–200)
HDL: 57.3 mg/dL (ref >39.00)
LDL Cholesterol: 109 mg/dL — ABNORMAL HIGH (ref 0–99)
NonHDL: 131.7
Total CHOL/HDL Ratio: 3
Triglycerides: 112 mg/dL (ref 0.0–149.0)
VLDL: 22.4 mg/dL (ref 0.0–40.0)

## 2020-04-03 ENCOUNTER — Telehealth: Payer: Self-pay | Admitting: Internal Medicine

## 2020-04-03 ENCOUNTER — Other Ambulatory Visit: Payer: Self-pay

## 2020-04-03 ENCOUNTER — Ambulatory Visit (INDEPENDENT_AMBULATORY_CARE_PROVIDER_SITE_OTHER): Payer: BC Managed Care – PPO | Admitting: Internal Medicine

## 2020-04-03 ENCOUNTER — Encounter: Payer: Self-pay | Admitting: Internal Medicine

## 2020-04-03 VITALS — BP 112/60 | HR 74 | Temp 98.2°F | Resp 16 | Ht 64.0 in | Wt 165.2 lb

## 2020-04-03 DIAGNOSIS — M349 Systemic sclerosis, unspecified: Secondary | ICD-10-CM | POA: Diagnosis not present

## 2020-04-03 DIAGNOSIS — Z0000 Encounter for general adult medical examination without abnormal findings: Secondary | ICD-10-CM | POA: Diagnosis not present

## 2020-04-03 DIAGNOSIS — Z23 Encounter for immunization: Secondary | ICD-10-CM | POA: Diagnosis not present

## 2020-04-03 DIAGNOSIS — Z860100 Personal history of colon polyps, unspecified: Secondary | ICD-10-CM

## 2020-04-03 DIAGNOSIS — Z1211 Encounter for screening for malignant neoplasm of colon: Secondary | ICD-10-CM

## 2020-04-03 DIAGNOSIS — K2100 Gastro-esophageal reflux disease with esophagitis, without bleeding: Secondary | ICD-10-CM

## 2020-04-03 DIAGNOSIS — E559 Vitamin D deficiency, unspecified: Secondary | ICD-10-CM | POA: Diagnosis not present

## 2020-04-03 MED ORDER — FAMOTIDINE 20 MG PO TABS: ORAL_TABLET | ORAL | 2 refills | Status: DC

## 2020-04-03 NOTE — Telephone Encounter (Signed)
Called and spoke to [place]. She verbalized understanding and had no further questions.

## 2020-04-03 NOTE — Progress Notes (Signed)
Patient ID: Denise Arnold, female   DOB: 1961-02-12, 59 y.o.   MRN: [id number]   Subjective:    Patient ID: Denise Arnold, female    DOB: 1961-07-18, 59 y.o.   MRN: [id number]  HPI This visit occurred during the SARS-CoV-2 public health emergency.  Safety protocols were in place, including screening questions prior to the visit, additional usage of staff PPE, and extensive cleaning of exam room while observing appropriate contact time as indicated for disinfecting solutions.  Patient here for her physical. She reports she is doing relatively well.  Sees rheumatology for f/u scleroderma.  Recently changed to procardia - due to amlodipine causing swelling.  Did not tolerate procardia.  Back on amlodipine - lower dose.  Tolerating.  Fingers - stable. No acute ulcerations.  Stays active.  Exercises.  No chest pain or sob.  No acid reflux reported.  No abdominal pain.  Bowels moving.  Due colonoscopy.    Past Medical History:  Diagnosis Date  . Arthritis   . GERD (gastroesophageal reflux disease)   . History of endoscopy 04/05/2013   upper  . Menorrhagia   . 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 Scleroderma (HCC)    positive FANA, positive SCL-70 abs, raynauds, mild pulmonary hypertension, normal DLCO  . Tricuspid regurgitation   . Vitamin D deficiency    Past Surgical History:  Procedure Laterality Date  . CESAREAN SECTION  1994  . COLONOSCOPY WITH PROPOFOL N/A 05/29/2015   Procedure: COLONOSCOPY WITH PROPOFOL;  Surgeon: Manya Silvas, MD;  Location: [facility] ENDOSCOPY;  Service: Endoscopy;  Laterality: N/A;  . DILATION AND CURETTAGE OF UTERUS  1992  . DILATION AND CURETTAGE OF UTERUS  1993  . DILATION AND CURETTAGE OF UTERUS  2011  . VEIN SURGERY     Family History  Problem Relation Age of Onset  . Heart disease Father        myocardial infarction  . Arthritis/Rheumatoid Father   . Hypertension Father   . Hyperlipidemia Mother   . Breast cancer Maternal Grandmother   . Colon cancer Neg Hx     Social History   Socioeconomic History  . Marital status: Married    Spouse name: Not on file  . Number of children: 1  . Years of education: Not on file  . Highest education level: Not on file  Occupational History  . Not on file  Tobacco Use  . Smoking status: Never Smoker  . Smokeless tobacco: Never Used  Substance and Sexual Activity  . Alcohol use: No    Alcohol/week: 0.0 standard drinks  . Drug use: No  . Sexual activity: Not on file  Other Topics Concern  . Not on file  Social History Narrative  . Not on file   Social Determinants of Health   Financial Resource Strain:   . Difficulty of Paying Living Expenses: Not on file  Food Insecurity:   . Worried About Charity fundraiser in the Last Year: Not on file  . Ran Out of Food in the Last Year: Not on file  Transportation Needs:   . Lack of Transportation (Medical): Not on file  . Lack of Transportation (Non-Medical): Not on file  Physical Activity:   . Days of Exercise per Week: Not on file  . Minutes of Exercise per Session: Not on file  Stress:   . Feeling of Stress : Not on file  Social Connections:   . Frequency of Communication with Friends and Family: Not on  file  . Frequency of Social Gatherings with Friends and Family: Not on file  . Attends Religious Services: Not on file  . Active Member of Clubs or Organizations: Not on file  . Attends Archivist Meetings: Not on file  . Marital Status: Not on file    Outpatient Encounter Medications as of 04/03/2020  Medication Sig  . amLODipine (NORVASC) 10 MG tablet Take 5 mg by mouth daily.   Marland Kitchen azelastine (ASTELIN) 0.1 % nasal spray PLACE 1 SPRAY INTO BOTH NOSTRILS 2 (TWO) TIMES DAILY. USE IN EACH NOSTRIL AS DIRECTED  . Biotin 5000 MCG TABS   . Calcium-Magnesium-Vitamin D (CALCIUM 500 PO) Take by mouth.  . cholecalciferol (VITAMIN D) 1000 UNITS tablet Take 1,000 Units by mouth daily.  . fish oil-omega-3 fatty acids 1000 MG capsule Take 1 g by  mouth daily.  Marland Kitchen glucosamine-chondroitin 500-400 MG tablet Take 1 tablet by mouth daily.  Marland Kitchen MISC NATURAL PRODUCTS PO Herbal Supplement  . Multiple Vitamin (MULTIVITAMIN) tablet Take 1 tablet by mouth daily.  . nitroGLYCERIN (NITROGLYN) 2 % ointment Apply 0.5 inches topically 3 (three) times daily as needed.   Marland Kitchen omeprazole (PRILOSEC) 20 MG capsule Take 20 mg by mouth 2 (two) times daily.   . Probiotic Product (PROBIOTIC PO) Take by mouth.  . fluconazole (DIFLUCAN) 150 MG tablet Take 1 tablet (150 mg total) by mouth daily. Take one tablet today.  May repeat in 3 days. (Patient not taking: Reported on 04/03/2020)   No facility-administered encounter medications on file as of 04/03/2020.    Review of Systems  Constitutional: Negative for appetite change and unexpected weight change.  HENT: Negative for congestion and sinus pressure.   Eyes: Negative for pain and visual disturbance.  Respiratory: Negative for cough, chest tightness and shortness of breath.   Cardiovascular: Negative for chest pain, palpitations and leg swelling.  Gastrointestinal: Negative for abdominal pain, diarrhea, nausea and vomiting.  Genitourinary: Negative for difficulty urinating and dysuria.  Musculoskeletal: Negative for joint swelling and myalgias.  Skin: Negative for color change and rash.  Neurological: Negative for dizziness, light-headedness and headaches.  Hematological: Negative for adenopathy. Does not bruise/bleed easily.  Psychiatric/Behavioral: Negative for agitation and dysphoric mood.       Objective:    Physical Exam Vitals reviewed.  Constitutional:      General: She is not in acute distress.    Appearance: Normal appearance. She is well-developed.  HENT:     Head: Normocephalic and atraumatic.     Right Ear: External ear normal.     Left Ear: External ear normal.  Eyes:     General: No scleral icterus.       Right eye: No discharge.        Left eye: No discharge.     Conjunctiva/sclera:  Conjunctivae normal.  Neck:     Thyroid: No thyromegaly.  Cardiovascular:     Rate and Rhythm: Normal rate and regular rhythm.  Pulmonary:     Effort: No tachypnea, accessory muscle usage or respiratory distress.     Breath sounds: Normal breath sounds. No decreased breath sounds or wheezing.  Chest:     Breasts:        Right: No inverted nipple, mass, nipple discharge or tenderness (no axillary adenopathy).        Left: No inverted nipple, mass, nipple discharge or tenderness (no axilarry adenopathy).  Abdominal:     General: Bowel sounds are normal.     Palpations: Abdomen is  soft.     Tenderness: There is no abdominal tenderness.  Musculoskeletal:        General: No swelling or tenderness.     Cervical back: Neck supple. No tenderness.  Lymphadenopathy:     Cervical: No cervical adenopathy.  Skin:    Findings: No erythema or rash.  Neurological:     Mental Status: She is alert and oriented to person, place, and time.  Psychiatric:        Mood and Affect: Mood normal.        Behavior: Behavior normal.     BP 112/60   Pulse 74   Temp 98.2 F (36.8 C) (Oral)   Resp 16   Ht 5\' 4"  (1.626 m)   Wt 165 lb 3.2 oz (74.9 kg)   LMP 02/14/2010   BMI 28.36 kg/m  Wt Readings from Last 3 Encounters:  04/03/20 165 lb 3.2 oz (74.9 kg)  09/23/19 167 lb 12.8 oz (76.1 kg)  02/26/19 164 lb 6.4 oz (74.6 kg)     Lab Results  Component Value Date   WBC 4.5 09/20/2019   HGB 13.2 09/20/2019   HCT 39.0 09/20/2019   PLT 218.0 09/20/2019   GLUCOSE 85 03/30/2020   CHOL 189 03/30/2020   TRIG 112.0 03/30/2020   HDL 57.30 03/30/2020   LDLCALC 109 (H) 03/30/2020   ALT 11 03/30/2020   AST 17 03/30/2020   NA 141 03/30/2020   K 4.2 03/30/2020   CL 106 03/30/2020   CREATININE 0.86 03/30/2020   BUN 13 03/30/2020   CO2 28 03/30/2020   TSH 1.65 09/20/2019    MM 3D SCREEN BREAST BILATERAL  Result Date: 01/23/2020 CLINICAL DATA:  Screening. EXAM: DIGITAL SCREENING BILATERAL MAMMOGRAM  WITH TOMO AND CAD COMPARISON:  Previous exam(s). ACR Breast Density Category b: There are scattered areas of fibroglandular density. FINDINGS: There are no findings suspicious for malignancy. Images were processed with CAD. IMPRESSION: No mammographic evidence of malignancy. A result letter of this screening mammogram will be mailed directly to the patient. RECOMMENDATION: Screening mammogram in one year. (Code:SM-B-01Y) BI-RADS CATEGORY  1: Negative. Electronically Signed   By: Lajean Manes M.D.   On: 01/23/2020 15:26       Assessment & Plan:   Problem List Items Addressed This Visit    Vitamin D deficiency    Follow vitamin D level.        Scleroderma ([place])    Followed by Dr Jefm Bryant.  Stable. On lower dose amlodipine.        History of colonic polyps    Colonoscopy 05/2015.  Due f/u.  Refer to GI.        Health care maintenance    Physical today 04/03/20.  PAP 02/2019 negative with negative HPV.  Colonoscopy 05/2015 - diminutive polyp, diverticulosis and internal hemorrhoids.  Refer to GI for f/u colonoscopy.       GERD (gastroesophageal reflux disease)    Upper symptoms controlled on prilosec.        Other Visit Diagnoses    Need for influenza vaccination    -  Primary   Relevant Orders   Flu Vaccine QUAD 36+ mos IM (Completed)   Colon cancer screening       Relevant Orders   Ambulatory referral to Gastroenterology       Einar Pheasant, MD

## 2020-04-03 NOTE — Telephone Encounter (Signed)
Pt states that PCP told her that she would call in prevacid. Pt would like that today if possible.

## 2020-04-03 NOTE — Telephone Encounter (Signed)
I have sent in pepcid.  She is to take 20mg  of prilosec in am and pepcid 30 minutes before her evening meal.

## 2020-04-03 NOTE — Assessment & Plan Note (Addendum)
Physical today 04/03/20.  PAP 02/2019 negative with negative HPV.  Colonoscopy 05/2015 - diminutive polyp, diverticulosis and internal hemorrhoids.  Refer to GI for f/u colonoscopy.

## 2020-04-12 ENCOUNTER — Encounter: Payer: Self-pay | Admitting: Internal Medicine

## 2020-04-12 NOTE — Assessment & Plan Note (Signed)
Follow vitamin D level.  

## 2020-04-12 NOTE — Assessment & Plan Note (Signed)
Upper symptoms controlled on prilosec.  

## 2020-04-12 NOTE — Assessment & Plan Note (Signed)
Followed by Dr Jefm Bryant.  Stable. On lower dose amlodipine.

## 2020-04-12 NOTE — Assessment & Plan Note (Signed)
Colonoscopy 05/2015.  Due f/u.  Refer to GI.

## 2020-04-16 ENCOUNTER — Encounter: Payer: Self-pay | Admitting: Internal Medicine

## 2020-04-20 ENCOUNTER — Other Ambulatory Visit: Payer: Self-pay

## 2020-04-20 ENCOUNTER — Ambulatory Visit (INDEPENDENT_AMBULATORY_CARE_PROVIDER_SITE_OTHER): Payer: BC Managed Care – PPO | Admitting: Dermatology

## 2020-04-20 ENCOUNTER — Encounter: Payer: BC Managed Care – PPO | Admitting: Dermatology

## 2020-04-20 DIAGNOSIS — W57XXXA Bitten or stung by nonvenomous insect and other nonvenomous arthropods, initial encounter: Secondary | ICD-10-CM | POA: Diagnosis not present

## 2020-04-20 DIAGNOSIS — D225 Melanocytic nevi of trunk: Secondary | ICD-10-CM

## 2020-04-20 DIAGNOSIS — L820 Inflamed seborrheic keratosis: Secondary | ICD-10-CM

## 2020-04-20 DIAGNOSIS — L309 Dermatitis, unspecified: Secondary | ICD-10-CM | POA: Diagnosis not present

## 2020-04-20 DIAGNOSIS — D2271 Melanocytic nevi of right lower limb, including hip: Secondary | ICD-10-CM

## 2020-04-20 DIAGNOSIS — Z1283 Encounter for screening for malignant neoplasm of skin: Secondary | ICD-10-CM | POA: Diagnosis not present

## 2020-04-20 DIAGNOSIS — L821 Other seborrheic keratosis: Secondary | ICD-10-CM

## 2020-04-20 DIAGNOSIS — L814 Other melanin hyperpigmentation: Secondary | ICD-10-CM

## 2020-04-20 DIAGNOSIS — S1086XA Insect bite of other specified part of neck, initial encounter: Secondary | ICD-10-CM | POA: Diagnosis not present

## 2020-04-20 DIAGNOSIS — M349 Systemic sclerosis, unspecified: Secondary | ICD-10-CM

## 2020-04-20 DIAGNOSIS — D229 Melanocytic nevi, unspecified: Secondary | ICD-10-CM

## 2020-04-20 DIAGNOSIS — D2239 Melanocytic nevi of other parts of face: Secondary | ICD-10-CM

## 2020-04-20 DIAGNOSIS — L578 Other skin changes due to chronic exposure to nonionizing radiation: Secondary | ICD-10-CM

## 2020-04-20 NOTE — Progress Notes (Signed)
   Follow-Up Visit   Subjective  Denise Arnold is a 59 y.o. female who presents for the following: Annual Exam (Total body skin exam) and bite (L neck ~1wk, was itchy).  Pt has cutaneous scleroderma and is managed by Dr. Jefm Bryant.  She gets ulcers on her fingertips from bad Raynaud's.  She has used Norvasc and topical nitrogycerin.  She is getting a tender red rash on her elbows.   The following portions of the chart were reviewed this encounter and updated as appropriate:      Review of Systems:  No other skin or systemic complaints except as noted in HPI or Assessment and Plan.  Objective  Well appearing patient in no apparent distress; mood and affect are within normal limits.  A full examination was performed including scalp, head, eyes, ears, nose, lips, neck, chest, axillae, abdomen, back, buttocks, bilateral upper extremities, bilateral lower extremities, hands, feet, fingers, toes, fingernails, and toenails. All findings within normal limits unless otherwise noted below.  Objective  Left neck: Pink edematous pap  Objective    Right Abdomen: 6.60mm light brown macule darker center  Right Thigh - Posterior: 3.64mm medium brown macule  L lower cheek: 6.86mm flesh pap  Objective  bil lower legs: Waxy tan patches with erythema  Objective  bil elbows: Pink patches  Objective  bil hands: Healed ulcerations fingertips, skin is sclerotic, tight and smooth with finger deformity, increase wrinkling perioral   Assessment & Plan    Seborrheic Keratoses - Stuck-on, waxy, tan-brown papules and plaques  - Discussed benign etiology and prognosis. - Observe - Call for any changes  Lentigines - Scattered tan macules - Discussed due to sun exposure - Benign, observe - Call for any changes  Actinic Damage - diffuse scaly erythematous macules with underlying dyspigmentation - Recommend daily broad spectrum sunscreen SPF 30+ to sun-exposed areas, reapply every 2 hours as  needed.  - Call for new or changing lesions.   Bug bite without infection, initial encounter Left neck  Benign, resolving  Nevus (3) L lower cheek; Right Thigh - Posterior; Right Abdomen  Benign-appearing. Stable. Observation.  Call clinic for new or changing moles.  Recommend daily use of broad spectrum spf 30+ sunscreen to sun-exposed areas.    Inflamed seborrheic keratosis bil lower legs  Benign appearing, discussed Ln2, will defer since not bothersome.  Can apply Trianex cream qd/bid prn  Dermatitis bil elbows  Vs Scleroderma  Start Trianex qd/bid until clear, sample given x 1 Lot [id number] 07/2021  Scleroderma ([place]) bil hands  Cutaneous, pt denies systemic involvement Recurrent digital ulcerations- currently healed Cont care with Dr. Precious Reel  Discussed filler for perioral rhytids, non-covered  Return in about 1 year (around 04/20/2021) for TBSE.  I, Othelia Pulling, RMA, am acting as scribe for Brendolyn Patty, MD . Documentation: I have reviewed the above documentation for accuracy and completeness, and I agree with the above.  Brendolyn Patty MD

## 2020-04-26 ENCOUNTER — Other Ambulatory Visit: Payer: Self-pay | Admitting: Internal Medicine

## 2020-05-11 ENCOUNTER — Ambulatory Visit
Admission: EM | Admit: 2020-05-11 | Discharge: 2020-05-11 | Disposition: A | Discharge status: Home / Self Care | Payer: BC Managed Care – PPO | Attending: Family Medicine | Admitting: Family Medicine

## 2020-05-11 ENCOUNTER — Encounter: Payer: Self-pay | Admitting: *Deleted

## 2020-05-11 ENCOUNTER — Telehealth: Payer: BC Managed Care – PPO | Admitting: Emergency Medicine

## 2020-05-11 DIAGNOSIS — J039 Acute tonsillitis, unspecified: Secondary | ICD-10-CM | POA: Diagnosis not present

## 2020-05-11 DIAGNOSIS — J029 Acute pharyngitis, unspecified: Secondary | ICD-10-CM

## 2020-05-11 MED ORDER — AZITHROMYCIN 250 MG PO TABS: 250.0000 mg | ORAL_TABLET | Freq: Every day | ORAL | 0 refills | Status: DC

## 2020-05-11 MED ORDER — FLUCONAZOLE 200 MG PO TABS: 200.0000 mg | ORAL_TABLET | Freq: Once | ORAL | 0 refills | Status: AC

## 2020-05-11 NOTE — ED Provider Notes (Signed)
[place]   [id number] 05/11/20 Arrival Time: 1336  RD:EYCX THROAT  SUBJECTIVE: History from: patient.  Denise Arnold is a 59 y.o. female who presents with abrupt onset of sore throat for one day.  Denies sick exposure to Covid, strep, flu or mono, or precipitating event. Has tried tylenol without relief. Has negative history of Covid. Has  completed Covid vaccines. Had negative rapid Covid test at home today. Declines Covid testing today. Symptoms are made worse with swallowing, but tolerating liquids and own secretions without difficulty.  Denies previous symptoms in the past.     Denies fever, chills, fatigue, ear pain, sinus pain, rhinorrhea, nasal congestion, cough, SOB, wheezing, chest pain, nausea, rash, changes in bowel or bladder habits.     ROS: As per HPI.  All other pertinent ROS negative.     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HCC)    Scleroderma (HCC)    positive FANA, positive SCL-70 abs, raynauds, mild pulmonary hypertension, normal DLCO   Tricuspid regurgitation    Vitamin D deficiency    Past Surgical History:  Procedure Laterality Date   CESAREAN SECTION  1994   COLONOSCOPY WITH PROPOFOL N/A 05/29/2015   Procedure: COLONOSCOPY WITH PROPOFOL;  Surgeon: Manya Silvas, MD;  Location: [place];  Service: Endoscopy;  Laterality: N/A;   DILATION AND CURETTAGE OF UTERUS  1992   DILATION AND CURETTAGE OF UTERUS  1993   DILATION AND CURETTAGE OF UTERUS  2011   VEIN SURGERY     Allergies  Allergen Reactions   Sulfa Antibiotics Nausea And Vomiting   Penicillins Rash   No current facility-administered medications on file prior to encounter.   Current Outpatient Medications on File Prior to Encounter  Medication Sig Dispense Refill   amLODipine (NORVASC) 10 MG tablet Take 5 mg by mouth  daily.   1   azelastine (ASTELIN) 0.1 % nasal spray PLACE 1 SPRAY INTO BOTH NOSTRILS 2 (TWO) TIMES DAILY. USE IN EACH NOSTRIL AS DIRECTED 30 mL 1   Biotin 5000 MCG TABS      Calcium-Magnesium-Vitamin D (CALCIUM 500 PO) Take by mouth.     cholecalciferol (VITAMIN D) 1000 UNITS tablet Take 1,000 Units by mouth daily.     famotidine (PEPCID) 20 MG tablet TAKE ONE TABLET 30 MINUTES BEFORE YOUR EVENING MEAL. 30 tablet 2   fish oil-omega-3 fatty acids 1000 MG capsule Take 1 g by mouth daily.     glucosamine-chondroitin 500-400 MG tablet Take 1 tablet by mouth daily.     MISC NATURAL PRODUCTS PO Herbal Supplement     Multiple Vitamin (MULTIVITAMIN) tablet Take 1 tablet by mouth daily.     nitroGLYCERIN (NITROGLYN) 2 % ointment Apply 0.5 inches topically 3 (three) times daily as needed.      omeprazole (PRILOSEC) 20 MG capsule Take 20 mg by mouth 2 (two) times daily.      Probiotic Product (PROBIOTIC PO) Take by mouth.     Social History   Socioeconomic History   Marital status: Married    Spouse name: Not on file   Number of children: 1   Years of education: Not on file   Highest education level: Not on file  Occupational History   Not on file  Tobacco Use   Smoking status: Never Smoker   Smokeless tobacco: Never  Used  Substance and Sexual Activity   Alcohol use: No    Alcohol/week: 0.0 standard drinks   Drug use: No   Sexual activity: Not on file  Other Topics Concern   Not on file  Social History Narrative   Not on file   Social Determinants of Health   Financial Resource Strain:    Difficulty of Paying Living Expenses: Not on file  Food Insecurity:    Worried About [place] in the Last Year: Not on file   Ran Out of Food in the Last Year: Not on file  Transportation Needs:    Lack of Transportation (Medical): Not on file   Lack of Transportation (Non-Medical): Not on file  Physical Activity:    Days of Exercise per Week: Not on file    Minutes of Exercise per Session: Not on file  Stress:    Feeling of Stress : Not on file  Social Connections:    Frequency of Communication with Friends and Family: Not on file   Frequency of Social Gatherings with Friends and Family: Not on file   Attends Religious Services: Not on file   Active Member of Clubs or Organizations: Not on file   Attends Archivist Meetings: Not on file   Marital Status: Not on file  Intimate Partner Violence:    Fear of Current or Ex-Partner: Not on file   Emotionally Abused: Not on file   Physically Abused: Not on file   Sexually Abused: Not on file   Family History  Problem Relation Age of Onset   Heart disease Father        myocardial infarction   Arthritis/Rheumatoid Father    Hypertension Father    Hyperlipidemia Mother    Breast cancer Maternal Grandmother    Colon cancer Neg Hx     OBJECTIVE:  Vitals:   05/11/20 1418  BP: 124/64  Pulse: 85  Resp: 16  Temp: 98.5 F (36.9 C)  TempSrc: Oral  SpO2: 95%     General appearance: alert; appears fatigued, but nontoxic, speaking in full sentences and managing own secretions HEENT: NCAT; Ears: EACs clear, TMs pearly gray with visible cone of light, without erythema; Eyes: PERRL, EOMI grossly; Nose: no obvious rhinorrhea; Throat: oropharynx erythematous, L tonsil 2+ and erythematous with white tonsillar exudates, uvula midline Neck: supple with LAD Lungs: CTA bilaterally without adventitious breath sounds; cough absent Heart: regular rate and rhythm.  Radial pulses 2+ symmetrical bilaterally Skin: warm and dry Psychological: alert and cooperative; normal mood and affect  LABS: No results found for this or any previous visit (from the past 24 hour(s)).   ASSESSMENT & PLAN:  1. Acute tonsillitis, unspecified etiology     Meds ordered this encounter  Medications   azithromycin (ZITHROMAX) 250 MG tablet    Sig: Take 1 tablet (250 mg total) by mouth  daily. Take first 2 tablets together, then 1 every day until finished.    Dispense:  6 tablet    Refill:  0    Order Specific Question:   Supervising Provider    Answer:   Chase Picket [[id number]]   fluconazole (DIFLUCAN) 200 MG tablet    Sig: Take 1 tablet (200 mg total) by mouth once for 1 dose.    Dispense:  2 tablet    Refill:  0    Order Specific Question:   Supervising Provider    Answer:   Chase Picket [id number]   Acute tonsillitis  Prescribed azithromycin Prescribed diflucan in case of yeast Get plenty of rest and push fluids Take OTC Zyrtec and use chloraseptic spray as needed for throat pain. Drink warm or cool liquids, use throat lozenges, or popsicles to help alleviate symptoms Take OTC ibuprofen or tylenol as needed for pain Follow up with PCP if symptoms persists Return or go to ER if patient has any new or worsening symptoms such as fever, chills, nausea, vomiting, worsening sore throat, cough, abdominal pain, chest pain, changes in bowel or bladder habits  Reviewed expectations re: course of current medical issues. Questions answered. Outlined signs and symptoms indicating need for more acute intervention. Patient verbalized understanding. After Visit Summary given.          Faustino Congress, NP 05/11/20 1439

## 2020-05-11 NOTE — Progress Notes (Signed)
We are sorry that you are not feeling well.  Here is how we plan to help!  Your symptoms indicate a likely viral infection (Pharyngitis).   Pharyngitis is inflammation in the back of the throat which can cause a sore throat, scratchiness and sometimes difficulty swallowing.   Pharyngitis is typically caused by a respiratory virus and will just run its course.  Please keep in mind that your symptoms could last up to 10 days.  For throat pain, we recommend over the counter oral pain relief medications such as acetaminophen or aspirin, or anti-inflammatory medications such as ibuprofen or naproxen sodium.  Topical treatments such as oral throat lozenges or sprays may be used as needed.  Avoid close contact with loved ones, especially the very young and elderly.  Remember to wash your hands thoroughly throughout the day as this is the number one way to prevent the spread of infection and wipe down door knobs and counters with disinfectant.  Without fever, strep throat is less likely.  After careful review of your answers, I would not recommend and antibiotic for your condition.  Antibiotics should not be used to treat conditions that we suspect are caused by viruses like the virus that causes the common cold or flu. However, some people can have Strep with atypical symptoms. You may need formal testing in clinic or office to confirm if your symptoms continue or worsen.  Providers prescribe antibiotics to treat infections caused by bacteria. Antibiotics are very powerful in treating bacterial infections when they are used properly.  To maintain their effectiveness, they should be used only when necessary.  Overuse of antibiotics has resulted in the development of super bugs that are resistant to treatment!    Home Care:  Only take medications as instructed by your medical team.  Do not drink alcohol while taking these medications.  A steam or ultrasonic humidifier can help congestion.  You can place a  towel over your head and breathe in the steam from hot water coming from a faucet.  Avoid close contacts especially the very young and the elderly.  Cover your mouth when you cough or sneeze.  Always remember to wash your hands.  Get Help Right Away If:  You develop worsening fever or throat pain.  You develop a severe head ache or visual changes.  Your symptoms persist after you have completed your treatment plan.  Make sure you  Understand these instructions.  Will watch your condition.  Will get help right away if you are not doing well or get worse.  Your e-visit answers were reviewed by a board certified advanced clinical practitioner to complete your personal care plan.  Depending on the condition, your plan could have included both over the counter or prescription medications.  If there is a problem please reply  once you have received a response from your provider.  Your safety is important to [place].  If you have drug allergies check your prescription carefully.    You can use MyChart to ask questions about todays visit, request a non-urgent call back, or ask for a work or school excuse for 24 hours related to this e-Visit. If it has been greater than 24 hours you will need to follow up with your provider, or enter a new e-Visit to address those concerns.  You will get an e-mail in the next two days asking about your experience.  I hope that your e-visit has been valuable and will speed your recovery. Thank you for  using e-visits.   Approximately 5 minutes was used in reviewing the patient's chart, questionnaire, prescribing medications, and documentation.

## 2020-05-11 NOTE — ED Triage Notes (Signed)
Patient reports sore throat and generalized body aches since last night. Did video visit but provider was not able to prescribe anything due to limits of video visit.   Home COVID test was negative.

## 2020-05-11 NOTE — Discharge Instructions (Signed)
I have sent in azithromycin for you to take. Take 2 tablets today, then one tablet daily for the next 4 days.  Follow up with this office or with primary care if symptoms are persisting.  Follow up in the ER for high fever, trouble swallowing, trouble breathing, other concerning symptoms.   

## 2020-07-12 ENCOUNTER — Encounter: Payer: Self-pay | Admitting: Internal Medicine

## 2020-07-14 ENCOUNTER — Other Ambulatory Visit: Payer: Self-pay | Admitting: Internal Medicine

## 2020-07-14 DIAGNOSIS — R300 Dysuria: Secondary | ICD-10-CM

## 2020-07-15 ENCOUNTER — Other Ambulatory Visit (INDEPENDENT_AMBULATORY_CARE_PROVIDER_SITE_OTHER): Payer: BC Managed Care – PPO

## 2020-07-15 ENCOUNTER — Other Ambulatory Visit: Payer: Self-pay

## 2020-07-15 DIAGNOSIS — R300 Dysuria: Secondary | ICD-10-CM | POA: Diagnosis not present

## 2020-07-15 LAB — URINALYSIS, ROUTINE W REFLEX MICROSCOPIC
Bilirubin Urine: NEGATIVE
Hgb urine dipstick: NEGATIVE
Ketones, ur: NEGATIVE
Nitrite: NEGATIVE
RBC / HPF: NONE SEEN (ref 0–?)
Specific Gravity, Urine: 1.02 (ref 1.000–1.030)
Total Protein, Urine: NEGATIVE
Urine Glucose: NEGATIVE
Urobilinogen, UA: 0.2 (ref 0.0–1.0)
pH: 5.5 (ref 5.0–8.0)

## 2020-07-16 LAB — URINE CULTURE
MICRO NUMBER:: 11366434
SPECIMEN QUALITY:: ADEQUATE

## 2020-07-17 NOTE — Progress Notes (Signed)
There is no evidence of UTI by culture results. If your symptoms are  persistent , please make an appointment with Dr. Lorin Picket or your gynecologist for a pelvic exam, because this may be due to atrophic or other forms of vaginitis .  Regards,   Duncan Dull, MD

## 2020-07-27 ENCOUNTER — Ambulatory Visit: Payer: BC Managed Care – PPO | Admitting: Internal Medicine

## 2020-10-02 ENCOUNTER — Encounter: Payer: Self-pay | Admitting: Internal Medicine

## 2020-10-02 ENCOUNTER — Other Ambulatory Visit: Payer: Self-pay

## 2020-10-02 ENCOUNTER — Ambulatory Visit (INDEPENDENT_AMBULATORY_CARE_PROVIDER_SITE_OTHER): Payer: BC Managed Care – PPO | Admitting: Internal Medicine

## 2020-10-02 DIAGNOSIS — Z860100 Personal history of colon polyps, unspecified: Secondary | ICD-10-CM

## 2020-10-02 DIAGNOSIS — M349 Systemic sclerosis, unspecified: Secondary | ICD-10-CM

## 2020-10-02 DIAGNOSIS — I7300 Raynaud's syndrome without gangrene: Secondary | ICD-10-CM

## 2020-10-02 NOTE — Progress Notes (Signed)
Patient ID: Denise Arnold, female   DOB: 1961-06-07, 60 y.o.   MRN: [id number]   Subjective:    Patient ID: Denise Arnold, female    DOB: 02/16/1961, 60 y.o.   MRN: [id number]  HPI This visit occurred during the SARS-CoV-2 public health emergency.  Safety protocols were in place, including screening questions prior to the visit, additional usage of staff PPE, and extensive cleaning of exam room while observing appropriate contact time as indicated for disinfecting solutions.  Patient here for a scheduled follow up. Here to follow up regarding her Reynaud's.  has some ulcerations on her finger tips.  multiple fingers.  Sees Dr Jefm Bryant.  Has nitroglycerin ointment.   She is staying active.  Exercises.  Teaches classes. No chest pain or sob with increased activity or exertion.  No acid reflux reported.  No abdominal pain or cramping.  Bowels doing better with probiotics.  Scheduled for f/u colonoscopy 12/21/20.  Taking cranbery tablets for her bladder.   No urinary symptoms.     Past Medical History:  Diagnosis Date  . Arthritis   . GERD (gastroesophageal reflux disease)   . History of endoscopy 04/05/2013   upper  . Menorrhagia   . 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 Scleroderma ([facility])    positive FANA, positive SCL-70 abs, raynauds, mild pulmonary hypertension, normal DLCO  . Tricuspid regurgitation   . Vitamin D deficiency    Past Surgical History:  Procedure Laterality Date  . CESAREAN SECTION  1994  . COLONOSCOPY WITH PROPOFOL N/A 05/29/2015   Procedure: COLONOSCOPY WITH PROPOFOL;  Surgeon: Manya Silvas, MD;  Location: [facility] ENDOSCOPY;  Service: Endoscopy;  Laterality: N/A;  . DILATION AND CURETTAGE OF UTERUS  1992  . DILATION AND CURETTAGE OF UTERUS  1993  . DILATION AND CURETTAGE OF UTERUS  2011  . VEIN SURGERY     Family History  Problem Relation Age of Onset  . Heart disease Father        myocardial infarction  . Arthritis/Rheumatoid Father   . Hypertension Father   .  Hyperlipidemia Mother   . Breast cancer Maternal Grandmother   . Colon cancer Neg Hx    Social History   Socioeconomic History  . Marital status: Married    Spouse name: Not on file  . Number of children: 1  . Years of education: Not on file  . Highest education level: Not on file  Occupational History  . Not on file  Tobacco Use  . Smoking status: Never Smoker  . Smokeless tobacco: Never Used  Substance and Sexual Activity  . Alcohol use: No    Alcohol/week: 0.0 standard drinks  . Drug use: No  . Sexual activity: Not on file  Other Topics Concern  . Not on file  Social History Narrative  . Not on file   Social Determinants of Health   Financial Resource Strain: Not on file  Food Insecurity: Not on file  Transportation Needs: Not on file  Physical Activity: Not on file  Stress: Not on file  Social Connections: Not on file    Outpatient Encounter Medications as of 10/02/2020  Medication Sig  . amLODipine (NORVASC) 10 MG tablet Take 5 mg by mouth daily.   Marland Kitchen azelastine (ASTELIN) 0.1 % nasal spray PLACE 1 SPRAY INTO BOTH NOSTRILS 2 (TWO) TIMES DAILY. USE IN EACH NOSTRIL AS DIRECTED  . Biotin 5000 MCG TABS   . Calcium-Magnesium-Vitamin D (CALCIUM 500 PO) Take by mouth.  . cholecalciferol (  VITAMIN D) 1000 UNITS tablet Take 1,000 Units by mouth daily.  . Cranberry 400 MG CAPS Take by mouth.  . fish oil-omega-3 fatty acids 1000 MG capsule Take 1 g by mouth daily.  Marland Kitchen glucosamine-chondroitin 500-400 MG tablet Take 1 tablet by mouth daily.  Marland Kitchen MISC NATURAL PRODUCTS PO Herbal Supplement  . Multiple Vitamin (MULTIVITAMIN) tablet Take 1 tablet by mouth daily.  . nitroGLYCERIN (NITROGLYN) 2 % ointment Apply 0.5 inches topically 3 (three) times daily as needed.   Marland Kitchen omeprazole (PRILOSEC) 20 MG capsule Take 20 mg by mouth 2 (two) times daily.   . Probiotic Product (PROBIOTIC PO) Take by mouth.  . [DISCONTINUED] azithromycin (ZITHROMAX) 250 MG tablet Take 1 tablet (250 mg total) by  mouth daily. Take first 2 tablets together, then 1 every day until finished.  . [DISCONTINUED] famotidine (PEPCID) 20 MG tablet TAKE ONE TABLET 30 MINUTES BEFORE YOUR EVENING MEAL.   No facility-administered encounter medications on file as of 10/02/2020.    Review of Systems  Constitutional: Negative for appetite change and unexpected weight change.  HENT: Negative for congestion and sinus pressure.   Respiratory: Negative for cough, chest tightness and shortness of breath.   Cardiovascular: Negative for chest pain, palpitations and leg swelling.  Gastrointestinal: Negative for abdominal pain, diarrhea, nausea and vomiting.  Genitourinary: Negative for difficulty urinating and dysuria.  Musculoskeletal: Negative for joint swelling and myalgias.  Skin: Negative for color change and rash.       Ulcerations - finger tips.    Neurological: Negative for dizziness, light-headedness and headaches.  Psychiatric/Behavioral: Negative for agitation and dysphoric mood.       Objective:    Physical Exam Vitals reviewed.  Constitutional:      General: She is not in acute distress.    Appearance: Normal appearance.  HENT:     Head: Normocephalic and atraumatic.     Right Ear: External ear normal.     Left Ear: External ear normal.  Eyes:     General: No scleral icterus.       Right eye: No discharge.        Left eye: No discharge.     Conjunctiva/sclera: Conjunctivae normal.  Neck:     Thyroid: No thyromegaly.  Cardiovascular:     Rate and Rhythm: Normal rate and regular rhythm.  Pulmonary:     Effort: No respiratory distress.     Breath sounds: Normal breath sounds. No wheezing.  Abdominal:     General: Bowel sounds are normal.     Palpations: Abdomen is soft.     Tenderness: There is no abdominal tenderness.  Musculoskeletal:        General: No swelling or tenderness.     Cervical back: Neck supple. No tenderness.  Lymphadenopathy:     Cervical: No cervical adenopathy.   Skin:    Findings: No erythema or rash.     Comments: Ulcerations - finger tips.   Neurological:     Mental Status: She is alert.  Psychiatric:        Mood and Affect: Mood normal.        Behavior: Behavior normal.     BP 104/60 (BP Location: Left Arm, Patient Position: Sitting)   Pulse 73   Temp 98 F (36.7 C)   Ht 5' 4.02" (1.626 m)   Wt 165 lb 9.6 oz (75.1 kg)   LMP 02/14/2010   SpO2 91%   BMI 28.41 kg/m  Wt Readings from Last 3 Encounters:  10/02/20 165 lb 9.6 oz (75.1 kg)  04/03/20 165 lb 3.2 oz (74.9 kg)  09/23/19 167 lb 12.8 oz (76.1 kg)     Lab Results  Component Value Date   WBC 4.5 09/20/2019   HGB 13.2 09/20/2019   HCT 39.0 09/20/2019   PLT 218.0 09/20/2019   GLUCOSE 85 03/30/2020   CHOL 189 03/30/2020   TRIG 112.0 03/30/2020   HDL 57.30 03/30/2020   LDLCALC 109 (H) 03/30/2020   ALT 11 03/30/2020   AST 17 03/30/2020   NA 141 03/30/2020   K 4.2 03/30/2020   CL 106 03/30/2020   CREATININE 0.86 03/30/2020   BUN 13 03/30/2020   CO2 28 03/30/2020   TSH 1.65 09/20/2019       Assessment & Plan:   Problem List Items Addressed This Visit    History of colonic polyps    Scheduled for colonoscopy 12/21/20.        Raynauds phenomenon    On low dose amlodipine.  Ulcerations fingertips.  Has nitroglycerin ointment.  D/w Dr Jefm Bryant regarding further treatment.       Scleroderma ([place])    Followed by Dr Jefm Bryant.  On low dose amlodipine.  Ulcerations fingertips.  Has nitroglycerin ointment.  D/w Dr Jefm Bryant regarding further treatment.            Einar Pheasant, MD

## 2020-10-03 ENCOUNTER — Encounter: Payer: Self-pay | Admitting: Internal Medicine

## 2020-10-03 NOTE — Assessment & Plan Note (Signed)
On low dose amlodipine.  Ulcerations fingertips.  Has nitroglycerin ointment.  D/w Dr Jefm Bryant regarding further treatment.

## 2020-10-03 NOTE — Assessment & Plan Note (Signed)
Scheduled for colonoscopy 12/21/20.

## 2020-10-03 NOTE — Assessment & Plan Note (Signed)
Followed by Dr Jefm Bryant.  On low dose amlodipine.  Ulcerations fingertips.  Has nitroglycerin ointment.  D/w Dr Jefm Bryant regarding further treatment.

## 2020-10-14 ENCOUNTER — Telehealth: Payer: Self-pay | Admitting: Internal Medicine

## 2020-10-14 NOTE — Telephone Encounter (Signed)
Message left with Dr Lavonne Chick for [facility] and other recommendations.

## 2020-12-09 ENCOUNTER — Other Ambulatory Visit: Payer: Self-pay | Admitting: Internal Medicine

## 2020-12-09 DIAGNOSIS — Z1231 Encounter for screening mammogram for malignant neoplasm of breast: Secondary | ICD-10-CM

## 2020-12-21 ENCOUNTER — Ambulatory Visit: Admit: 2020-12-21 | Payer: BC Managed Care – PPO

## 2020-12-21 SURGERY — COLONOSCOPY WITH PROPOFOL
Anesthesia: General

## 2021-01-25 ENCOUNTER — Other Ambulatory Visit: Payer: Self-pay

## 2021-01-25 ENCOUNTER — Ambulatory Visit
Admission: RE | Admit: 2021-01-25 | Discharge: 2021-01-25 | Disposition: A | Discharge status: Home / Self Care | Payer: BC Managed Care – PPO | Source: Ambulatory Visit | Attending: Internal Medicine | Admitting: Internal Medicine

## 2021-01-25 DIAGNOSIS — Z1231 Encounter for screening mammogram for malignant neoplasm of breast: Secondary | ICD-10-CM

## 2021-03-23 ENCOUNTER — Ambulatory Visit: Admit: 2021-03-23 | Payer: BC Managed Care – PPO | Admitting: Internal Medicine

## 2021-03-23 LAB — HM COLONOSCOPY

## 2021-03-23 SURGERY — COLONOSCOPY
Anesthesia: General

## 2021-04-16 ENCOUNTER — Encounter: Payer: BC Managed Care – PPO | Admitting: Internal Medicine

## 2021-04-17 ENCOUNTER — Encounter: Payer: Self-pay | Admitting: Internal Medicine

## 2021-04-20 NOTE — Telephone Encounter (Signed)
Placed call to pt to see if she'd like to schedule with [place] this afternoon. LMTCB

## 2021-04-20 NOTE — Telephone Encounter (Signed)
Pt called back and stated the pain has went away and she is okay to wait until her appt in November. Advised pt to call back if pain comes back so she can get scheduled to be evaluated.

## 2021-04-26 ENCOUNTER — Encounter: Payer: BC Managed Care – PPO | Admitting: Dermatology

## 2021-05-10 ENCOUNTER — Ambulatory Visit: Payer: BC Managed Care – PPO | Admitting: Dermatology

## 2021-05-11 ENCOUNTER — Telehealth: Payer: Self-pay

## 2021-05-11 NOTE — Telephone Encounter (Signed)
Noted.  Per Larena Glassman, doing some better.  Did not feel needed appt today.  Follow.  Call with update.

## 2021-05-11 NOTE — Telephone Encounter (Signed)
Sending this as an Pharmacist, hospital. There is a phone note (access nurse) about this as well.

## 2021-05-11 NOTE — Telephone Encounter (Signed)
Left a message to call back. Pt called into access nurse requesting Paxlovid. Pt is on vacation and believes she was exposed to covid from the local urgent cares. Pt is having body aches, chills, and nasal congestion with a temp of 99.3.

## 2021-05-11 NOTE — Telephone Encounter (Signed)
See phone note.  Pt evaluated and prescribed paxlovid.

## 2021-05-11 NOTE — Telephone Encounter (Signed)
FYI-  Called patient to offer telephone/virtual visit. Confirmed no sob, chest tightness, fevers above 100, etc. Patient started having sx 10/22 and tested positive on 10/23. Was given paxlovid after doing a "doctor on demand televisit". Has taken two doses of paxlovid so far and starting to feel better. Main complaint is fatigue. Advised for patient to continue medication and let us know if she needs anything.

## 2021-05-13 ENCOUNTER — Telehealth: Payer: Self-pay | Admitting: Internal Medicine

## 2021-05-13 NOTE — Telephone Encounter (Signed)
Agree with need for evaluation now given symptoms.  

## 2021-05-13 NOTE — Telephone Encounter (Signed)
FYI-  Patient tested positive for COVID 10/23. Was started on paxlovid after doing virtual visit with acute care MD. Patient has been having diarrhea since 10/21. Today she noticed that she felt her BP was low because she was light headed. She is out of town so she does not have a way to check her BP but she checked her pulse with her watch and it was elevated. She said she has felt some better since starting the paxlovid but not significantly better. She thought that the light headedness may be coming from the paxlovid. She also noted that she is on doxycycline right now which also was contributing to the persistent diarrhea she has been having for a week. Advised that the light headedness, elevated pulse, persistent diarrhea could indicate dehydration- may need fluids. Also advised stopping paxlovid until evaluated. Patient is going to go to acute care that is local to her and will go to ED if suggested and then f/u with [place] after

## 2021-05-13 NOTE — Telephone Encounter (Signed)
Patient has COVID and is on the antiviral medication. She thinks the medication is making her BP run low. She wants to know if she can stop antiviral medication.

## 2021-05-13 NOTE — Telephone Encounter (Signed)
Noted. Will f/u with patient 

## 2021-05-14 NOTE — Telephone Encounter (Signed)
FYI   Called patient for a follow up. Urgent care told her yesterday that she was probably dehydrated and encouraged PO fluids. Also advised to stop paxlovid. Patient stated she is still not feeling any better but her diarrhea is not feeling any better. They ended up coming home and she is going to go to the ED to be re-evaluated.

## 2021-05-14 NOTE — Telephone Encounter (Signed)
Noted  

## 2021-05-14 NOTE — Telephone Encounter (Signed)
Noted.  Agree with need for evaluation.   

## 2021-05-17 ENCOUNTER — Ambulatory Visit: Payer: BC Managed Care – PPO | Admitting: Dermatology

## 2021-05-17 NOTE — Telephone Encounter (Signed)
Patient called regarding a previous visit to ED in which they told her to follow up with her PCP. Patient is wanting to speak to the assistant.

## 2021-05-18 NOTE — Telephone Encounter (Signed)
Reviewed chart.  Who is prescribing sildenafil?  (Dr Jefm Bryant or Dr Raul Del?).  Is she still taking now?  If so, would clarify with them regarding stopping.  I am ok to restart amlodipine - if no problem with other prescribing MD.   Keep [place] posted.

## 2021-05-18 NOTE — Telephone Encounter (Signed)
Patient says that she was calling to follow up from recent covid and ED trips. First trip to ED they did EKG, labs, and IV fluids. Some better. Went back to ED yesterday because she keeps having short intermittent episodes of her heart racing and her BP is higher than it normally is for her. EKG was normal. Vitals at ED on 10/28 was 155/85, P-91, O2- 97-99%, 10/31- 132/70, P- 76, O2- 97-99%. Today while on the phone her vitals were: 143/87, P-85, 99%. She has been off of her blood pressure medication since having covid (10/23). But wanted to discuss stopping it at her appt with you coming up. Patient used to be on amlodipine 5 mg q day and was wondering if she could restart this medication and then discuss how she is doing with it at her appt on 11/14. Overall patient is doing better since testing positive. Did not tolerate the paxlovid. Diarrhea is some better. She is able to eat some- appetite still decreased. Drinking fluids. ED checked her for c-diff and was negative. Advised I would send message over and let her know about what to do with her blood pressure medication. The medication that she is wanting to stay off of the sildenafil.

## 2021-05-18 NOTE — Telephone Encounter (Signed)
Rheumatology prescribed. She has been off since she tested positive with COVID. Does not want to restart medication. Will restart amlodipine 5 mg q day and will let Dr Jefm Bryant know she is remaining off of the sildenafil. Patient will monitor bp and let me know if she needs anything prior to 11/14

## 2021-05-19 NOTE — Telephone Encounter (Signed)
FYI , sent to me by mistake. Pt called in stating that she is still have symptoms from Covid.Pt wanted to schedule an appointment. Advise Pt of earliest date. Pt wasn't happen with the appt date that was provided. Pt would like a callback.  Denise Arnold

## 2021-05-19 NOTE — Telephone Encounter (Signed)
Pt called in stating that she is still have symptoms from Covid.Pt wanted to schedule an appointment. Advise Pt of earliest date. Pt wasn't happen with the appt date that was provided. Pt would like a callback.

## 2021-05-19 NOTE — Telephone Encounter (Signed)
Please call Denise Arnold and let her know that I was not in the office when she called in.  Please see how she is doing.  Need update on symptoms.

## 2021-05-20 ENCOUNTER — Emergency Department
Admission: EM | Admit: 2021-05-20 | Discharge: 2021-05-20 | Discharge status: Against Medical Advice | Payer: BC Managed Care – PPO

## 2021-05-20 NOTE — Telephone Encounter (Signed)
Patient feeling much better , provider at ED felt most of symptoms were coming from Anxiety per patient and she now feels the same as though it was anxiety. Patient has an appointment scheduled with PCP on 05/31/21 advised to keep appointment and to call office if symptoms return.

## 2021-05-20 NOTE — Telephone Encounter (Signed)
FYI Patient currently in the ED.

## 2021-05-20 NOTE — Telephone Encounter (Signed)
Please still call her and let her know that I was not in the office yesterday when she called and please follow up and see how she is doing.

## 2021-05-21 ENCOUNTER — Telehealth: Payer: Self-pay

## 2021-05-21 ENCOUNTER — Encounter: Payer: Self-pay | Admitting: Internal Medicine

## 2021-05-21 NOTE — Telephone Encounter (Signed)
Mychart message:  Do you think there is anyway you can move up my appointment on Nov 14 to next week?  This anxiety I'm feeling about my COVID needs to get under control. Thanks.

## 2021-05-24 NOTE — Telephone Encounter (Signed)
Advised that Dr Nicki Reaper does not have anything today but can see her tomorrow. Patient wanted to be seen today. Did not want to go to urgent care or ED. Was given anxiety meds in the ED to take every 8 hours PRN. Confirmed no new/acute symptoms. Latest symptom is sweating. Confirmed no fever. She is going to try this medication and see Dr Nicki Reaper tomorrow

## 2021-05-24 NOTE — Telephone Encounter (Signed)
Called patient to discussed. Patient says that she needs to be seen by PCP. She is having a lot of anxiety since having COVID. She has been to the ED 4 different times. Newest symptom is sweating with little to no activity. Confirmed no fever with sweating. Patient has not had any sweating today. Scheduled for tomorrow at 12. Screened patient and confirmed ok to come into office.

## 2021-05-25 ENCOUNTER — Encounter: Payer: Self-pay | Admitting: Internal Medicine

## 2021-05-25 ENCOUNTER — Other Ambulatory Visit: Payer: Self-pay

## 2021-05-25 ENCOUNTER — Ambulatory Visit (INDEPENDENT_AMBULATORY_CARE_PROVIDER_SITE_OTHER): Payer: BC Managed Care – PPO | Admitting: Internal Medicine

## 2021-05-25 ENCOUNTER — Other Ambulatory Visit
Admission: RE | Admit: 2021-05-25 | Discharge: 2021-05-25 | Disposition: A | Discharge status: Home / Self Care | Payer: BC Managed Care – PPO | Source: Ambulatory Visit | Attending: Internal Medicine | Admitting: Internal Medicine

## 2021-05-25 VITALS — BP 154/62 | HR 125 | Temp 97.6°F | Ht 64.0 in | Wt 158.0 lb

## 2021-05-25 DIAGNOSIS — R5383 Other fatigue: Secondary | ICD-10-CM | POA: Insufficient documentation

## 2021-05-25 DIAGNOSIS — R002 Palpitations: Secondary | ICD-10-CM

## 2021-05-25 DIAGNOSIS — E559 Vitamin D deficiency, unspecified: Secondary | ICD-10-CM | POA: Diagnosis not present

## 2021-05-25 DIAGNOSIS — R197 Diarrhea, unspecified: Secondary | ICD-10-CM

## 2021-05-25 DIAGNOSIS — R300 Dysuria: Secondary | ICD-10-CM | POA: Diagnosis not present

## 2021-05-25 DIAGNOSIS — K2100 Gastro-esophageal reflux disease with esophagitis, without bleeding: Secondary | ICD-10-CM

## 2021-05-25 DIAGNOSIS — M349 Systemic sclerosis, unspecified: Secondary | ICD-10-CM

## 2021-05-25 LAB — HEPATIC FUNCTION PANEL
ALT: 11 U/L (ref 0–35)
AST: 13 U/L (ref 0–37)
Albumin: 4.2 g/dL (ref 3.5–5.2)
Alkaline Phosphatase: 60 U/L (ref 39–117)
Bilirubin, Direct: 0.1 mg/dL (ref 0.0–0.3)
Total Bilirubin: 0.5 mg/dL (ref 0.2–1.2)
Total Protein: 6.8 g/dL (ref 6.0–8.3)

## 2021-05-25 LAB — URINALYSIS, ROUTINE W REFLEX MICROSCOPIC
Bilirubin Urine: NEGATIVE
Hgb urine dipstick: NEGATIVE
Ketones, ur: NEGATIVE
Leukocytes,Ua: NEGATIVE
Nitrite: NEGATIVE
RBC / HPF: NONE SEEN (ref 0–?)
Specific Gravity, Urine: 1.02 (ref 1.000–1.030)
Total Protein, Urine: NEGATIVE
Urine Glucose: NEGATIVE
Urobilinogen, UA: 0.2 (ref 0.0–1.0)
pH: 6 (ref 5.0–8.0)

## 2021-05-25 LAB — C DIFFICILE QUICK SCREEN W PCR REFLEX
C Diff antigen: NEGATIVE
C Diff interpretation: NOT DETECTED
C Diff toxin: NEGATIVE

## 2021-05-25 LAB — CBC WITH DIFFERENTIAL/PLATELET
Basophils Absolute: 0 10*3/uL (ref 0.0–0.1)
Basophils Relative: 0.4 % (ref 0.0–3.0)
Eosinophils Absolute: 0 10*3/uL (ref 0.0–0.7)
Eosinophils Relative: 0.4 % (ref 0.0–5.0)
HCT: 40.5 % (ref 36.0–46.0)
Hemoglobin: 13.4 g/dL (ref 12.0–15.0)
Lymphocytes Relative: 11.4 % — ABNORMAL LOW (ref 12.0–46.0)
Lymphs Abs: 1.4 10*3/uL (ref 0.7–4.0)
MCHC: 33.2 g/dL (ref 30.0–36.0)
MCV: 91 fl (ref 78.0–100.0)
Monocytes Absolute: 0.8 10*3/uL (ref 0.1–1.0)
Monocytes Relative: 6.6 % (ref 3.0–12.0)
Neutro Abs: 10 10*3/uL — ABNORMAL HIGH (ref 1.4–7.7)
Neutrophils Relative %: 81.2 % — ABNORMAL HIGH (ref 43.0–77.0)
Platelets: 250 10*3/uL (ref 150.0–400.0)
RBC: 4.45 Mil/uL (ref 3.87–5.11)
RDW: 13.8 % (ref 11.5–15.5)
WBC: 12.3 10*3/uL — ABNORMAL HIGH (ref 4.0–10.5)

## 2021-05-25 LAB — BASIC METABOLIC PANEL
BUN: 10 mg/dL (ref 6–23)
CO2: 29 mEq/L (ref 19–32)
Calcium: 9.6 mg/dL (ref 8.4–10.5)
Chloride: 103 mEq/L (ref 96–112)
Creatinine, Ser: 0.79 mg/dL (ref 0.40–1.20)
GFR: 81.85 mL/min (ref >60.00)
Glucose, Bld: 89 mg/dL (ref 70–99)
Potassium: 4.1 mEq/L (ref 3.5–5.1)
Sodium: 140 mEq/L (ref 135–145)

## 2021-05-25 LAB — TSH: TSH: 1.14 u[IU]/mL (ref 0.35–5.50)

## 2021-05-25 LAB — VITAMIN D 25 HYDROXY (VIT D DEFICIENCY, FRACTURES): VITD: 49.85 ng/mL (ref 30.00–100.00)

## 2021-05-25 LAB — VITAMIN B12: Vitamin B-12: 362 pg/mL (ref 211–911)

## 2021-05-25 MED ORDER — SERTRALINE HCL 25 MG PO TABS: 25.0000 mg | ORAL_TABLET | Freq: Every day | ORAL | 2 refills | Status: DC

## 2021-05-25 NOTE — Progress Notes (Signed)
Patient ID: Denise Arnold, female   DOB: January 29, 1961, 60 y.o.   MRN: [id number]   Subjective:    Patient ID: Denise Arnold, female    DOB: 07/20/1960, 60 y.o.   MRN: [id number]  This visit occurred during the SARS-CoV-2 public health emergency.  Safety protocols were in place, including screening questions prior to the visit, additional usage of staff PPE, and extensive cleaning of exam room while observing appropriate contact time as indicated for disinfecting solutions.   Patient here for work in appt    HPI Was diagnosed with covid 05/09/21.  Treated with paxlovid.  Developed diarrhea. Evaluated in ER 05/14/21 - diarrhea, headache and heart palpitations.  Stopped paxlovid.  Had also been on doxycycline for finger infection.  Had persistent diarrhea and was reevaluated 05/17/21 .  Given IVFs.  C.diff negative.  Was reevaluated in ER 05/20/21 - increased palpitatioins and persistetn night sweats.  Blood pressure elevated.   EKG unrevealing.  Felt persistent symptoms related to covid and anxiety.  Reevaluated in ER 11//5/22 - with burning sensation bilateral feet and arms.  Was given atarax.  She comes in today stating she is feeling better.  Some persistent increased heart rate and palpitations.  Feels jittery.  Decreased appetite.  No nausea.  She is eating.  Persistent loose stool.  More soft this am.  Taking probiotics.  Sweats - worse last week.  No chest pain.  Breathing overall stable.  No vomiting.     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HCC)    Scleroderma (HCC)    positive FANA, positive SCL-70 abs, raynauds, mild pulmonary hypertension, normal DLCO   Tricuspid regurgitation    Vitamin D deficiency    Past Surgical History:  Procedure Laterality Date   CESAREAN SECTION  1994   COLONOSCOPY WITH PROPOFOL N/A 05/29/2015   Procedure: COLONOSCOPY WITH  PROPOFOL;  Surgeon: Manya Silvas, MD;  Location: [place];  Service: Endoscopy;  Laterality: N/A;   DILATION AND CURETTAGE OF UTERUS  1992   DILATION AND CURETTAGE OF UTERUS  1993   DILATION AND CURETTAGE OF UTERUS  2011   VEIN SURGERY     Family History  Problem Relation Age of Onset   Heart disease Father        myocardial infarction   Arthritis/Rheumatoid Father    Hypertension Father    Hyperlipidemia Mother    Breast cancer Maternal Grandmother    Colon cancer Neg Hx    Social History   Socioeconomic History   Marital status: Married    Spouse name: Not on file   Number of children: 1   Years of education: Not on file   Highest education level: Not on file  Occupational History   Not on file  Tobacco Use   Smoking status: Never   Smokeless tobacco: Never  Substance and Sexual Activity   Alcohol use: No    Alcohol/week: 0.0 standard drinks   Drug use: No   Sexual activity: Not on file  Other Topics Concern   Not on file  Social History Narrative   Not on file   Social Determinants of Health   Financial Resource Strain: Not on file  Food Insecurity: Not on file  Transportation Needs: Not on file  Physical Activity: Not on file  Stress: Not on file  Social Connections: Not on file     Review of Systems  Constitutional:  Negative for fever.       Decreased appetite  HENT:  Negative for congestion and sinus pressure.   Respiratory:  Negative for cough and chest tightness.   Cardiovascular:  Negative for chest pain and leg swelling.       Increased heart rate and palpitations.    Gastrointestinal:  Positive for diarrhea. Negative for abdominal pain and vomiting.  Genitourinary:  Negative for difficulty urinating and dysuria.  Musculoskeletal:  Negative for joint swelling and myalgias.  Skin:  Negative for color change and rash.  Neurological:  Negative for dizziness.       No significant headache.   Psychiatric/Behavioral:         Increased  stress and anxiety as outlined.        Objective:     BP (!) 154/62   Pulse (!) 125   Temp 97.6 F (36.4 C) (Skin)   Ht 5\' 4"  (1.626 m)   Wt 158 lb (71.7 kg)   LMP 02/14/2010   SpO2 95%   BMI 27.12 kg/m  Wt Readings from Last 3 Encounters:  05/25/21 158 lb (71.7 kg)  10/02/20 165 lb 9.6 oz (75.1 kg)  04/03/20 165 lb 3.2 oz (74.9 kg)    Physical Exam Vitals reviewed.  Constitutional:      General: She is not in acute distress.    Appearance: Normal appearance.  HENT:     Head: Normocephalic and atraumatic.     Right Ear: External ear normal.     Left Ear: External ear normal.  Eyes:     General: No scleral icterus.       Right eye: No discharge.        Left eye: No discharge.     Conjunctiva/sclera: Conjunctivae normal.  Neck:     Thyroid: No thyromegaly.  Cardiovascular:     Rate and Rhythm: Normal rate and regular rhythm.  Pulmonary:     Effort: No respiratory distress.     Breath sounds: Normal breath sounds. No wheezing.  Abdominal:     General: Bowel sounds are normal.     Palpations: Abdomen is soft.     Tenderness: There is no abdominal tenderness.  Musculoskeletal:        General: No swelling or tenderness.     Cervical back: Neck supple. No tenderness.  Lymphadenopathy:     Cervical: No cervical adenopathy.  Skin:    Findings: No erythema or rash.  Neurological:     Mental Status: She is alert.  Psychiatric:        Mood and Affect: Mood normal.        Behavior: Behavior normal.     Outpatient Encounter Medications as of 05/25/2021  Medication Sig   amLODipine (NORVASC) 10 MG tablet Take 5 mg by mouth daily.    azelastine (ASTELIN) 0.1 % nasal spray PLACE 1 SPRAY INTO BOTH NOSTRILS 2 (TWO) TIMES DAILY. USE IN EACH NOSTRIL AS DIRECTED   Biotin 5000 MCG TABS    Calcium-Magnesium-Vitamin D (CALCIUM 500 PO) Take by mouth.   Cholecalciferol (VITAMIN D) 50 MCG (2000 UT) tablet Take 2,000 Units by mouth daily.   Cranberry 400 MG CAPS Take by mouth.    fish oil-omega-3 fatty acids 1000 MG capsule Take 1 g by mouth daily.   glucosamine-chondroitin 500-400 MG tablet Take 1 tablet by mouth daily.   hydrOXYzine (ATARAX/VISTARIL) 25 MG tablet Take by  mouth.   MISC NATURAL PRODUCTS PO Herbal Supplement   Multiple Vitamin (MULTIVITAMIN) tablet Take 1 tablet by mouth daily.   mupirocin ointment (BACTROBAN) 2 % Apply topically daily.   nitroGLYCERIN (NITROGLYN) 2 % ointment Apply 0.5 inches topically 3 (three) times daily as needed.    omeprazole (PRILOSEC) 20 MG capsule Take 20 mg by mouth 2 (two) times daily.    Probiotic Product (PROBIOTIC PO) Take by mouth.   sertraline (ZOLOFT) 25 MG tablet Take 1 tablet (25 mg total) by mouth daily.   No facility-administered encounter medications on file as of 05/25/2021.     Lab Results  Component Value Date   WBC 12.3 (H) 05/25/2021   HGB 13.4 05/25/2021   HCT 40.5 05/25/2021   PLT 250.0 05/25/2021   GLUCOSE 89 05/25/2021   CHOL 189 03/30/2020   TRIG 112.0 03/30/2020   HDL 57.30 03/30/2020   LDLCALC 109 (H) 03/30/2020   ALT 11 05/25/2021   AST 13 05/25/2021   NA 140 05/25/2021   K 4.1 05/25/2021   CL 103 05/25/2021   CREATININE 0.79 05/25/2021   BUN 10 05/25/2021   CO2 29 05/25/2021   TSH 1.14 05/25/2021    MM 3D SCREEN BREAST BILATERAL  Result Date: 01/28/2021 CLINICAL DATA:  Screening. EXAM: DIGITAL SCREENING BILATERAL MAMMOGRAM WITH TOMOSYNTHESIS AND CAD TECHNIQUE: Bilateral screening digital craniocaudal and mediolateral oblique mammograms were obtained. Bilateral screening digital breast tomosynthesis was performed. The images were evaluated with computer-aided detection. COMPARISON:  Previous exam(s). ACR Breast Density Category b: There are scattered areas of fibroglandular density. FINDINGS: There are no findings suspicious for malignancy. IMPRESSION: No mammographic evidence of malignancy. A result letter of this screening mammogram will be mailed directly to the patient.  RECOMMENDATION: Screening mammogram in one year. (Code:SM-B-01Y) BI-RADS CATEGORY  1: Negative. Electronically Signed   By: Ammie Ferrier M.D.   On: 01/28/2021 14:32       Assessment & Plan:   Problem List Items Addressed This Visit     Diarrhea    Persistent diarrhea.  Recently treated with 3 weeks of doxycycline.  Continue probiotic.  More soft stool today.  Recheck c.diff to confirm negative.  Consider adding fiber to help bulk up stool.  Await lab results first.       Relevant Orders   Hepatic function panel (Completed)   Basic metabolic panel (Completed)   Clostridium Difficile by PCR([facility])   Fatigue    Increased fatigue.  Recent covid.  Persistent loose stools.  Check routine labs and recheck c.diff.        Relevant Orders   CBC with Differential/Platelet (Completed)   TSH (Completed)   Vitamin B12 (Completed)   GERD (gastroesophageal reflux disease)    Upper symptoms controlled on prilosec.       Palpitations    Increased heart rate and palpitations as outlined.  Continuing.  Recent EKG - unrevealing.  Discussed increased stress and anxiety.  Given persistent issues, discussed cardiology evaluation to confirm if any further cardiac w/u warranted.  Discussed possible monitor, echo, etc - given recent covid infection.        Relevant Orders   Ambulatory referral to Cardiology   Scleroderma [facility])    Followed by Dr Jefm Bryant.  On low dose amlodipine.  Previous infection - finger.  Had been on doxycycline.  Off now.  Continue bactroban.       Vitamin D deficiency - Primary   Relevant Orders   VITAMIN D 25 Hydroxy (Vit-D Deficiency,  Fractures) (Completed)   Other Visit Diagnoses     Dysuria       Relevant Orders   Urinalysis, Routine w reflex microscopic (Completed)   Urine Culture (Completed)       I spent 45 minutes with the patient and more than 50% of the time was spent in consultation regarding the above.   Time spent discussing her recent  evaluation and current symptoms.  Time also spent discussing further w/up and evaluation.   Einar Pheasant, MD

## 2021-05-26 LAB — URINE CULTURE
MICRO NUMBER:: 12609061
Result:: NO GROWTH
SPECIMEN QUALITY:: ADEQUATE

## 2021-05-28 ENCOUNTER — Ambulatory Visit: Payer: BC Managed Care – PPO | Admitting: Family Medicine

## 2021-05-28 ENCOUNTER — Ambulatory Visit (INDEPENDENT_AMBULATORY_CARE_PROVIDER_SITE_OTHER): Payer: BC Managed Care – PPO | Admitting: Internal Medicine

## 2021-05-28 ENCOUNTER — Other Ambulatory Visit: Payer: Self-pay

## 2021-05-28 DIAGNOSIS — M349 Systemic sclerosis, unspecified: Secondary | ICD-10-CM

## 2021-05-28 DIAGNOSIS — K2100 Gastro-esophageal reflux disease with esophagitis, without bleeding: Secondary | ICD-10-CM

## 2021-05-28 DIAGNOSIS — F419 Anxiety disorder, unspecified: Secondary | ICD-10-CM

## 2021-05-28 DIAGNOSIS — I7300 Raynaud's syndrome without gangrene: Secondary | ICD-10-CM | POA: Diagnosis not present

## 2021-05-28 DIAGNOSIS — R002 Palpitations: Secondary | ICD-10-CM | POA: Insufficient documentation

## 2021-05-28 MED ORDER — ALPRAZOLAM 0.25 MG PO TABS: 0.2500 mg | ORAL_TABLET | Freq: Two times a day (BID) | ORAL | 0 refills | Status: DC | PRN

## 2021-05-28 NOTE — Telephone Encounter (Signed)
Please call her and confirm doing ok.  I can see her at 4:00 today if needs to be worked in.

## 2021-05-28 NOTE — Progress Notes (Signed)
Patient ID: EARMA NICOLAOU, female   DOB: October 29, 1960, 60 y.o.   MRN: [id number]   Subjective:    Patient ID: Bing Matter Daughtrey, female    DOB: 07-14-1961, 60 y.o.   MRN: [id number]  This visit occurred during the SARS-CoV-2 public health emergency.  Safety protocols were in place, including screening questions prior to the visit, additional usage of staff PPE, and extensive cleaning of exam room while observing appropriate contact time as indicated for disinfecting solutions.   Patient here for work in  - anxiety.   Chief Complaint  Patient presents with   Anxiety   .   HPI Recent covid infection.  Increased fatigue.  Has not been exercising.  Has been having issues with anxiety.  Discussed.  Started zoloft yesterday.  Hydroxyzine may have helped some.  Still with burning sensation arms.  Ears hot.  No chest pain with activity reported.  Eating.  No vomiting.  Bowels stable.     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HCC)    Scleroderma (HCC)    positive FANA, positive SCL-70 abs, raynauds, mild pulmonary hypertension, normal DLCO   Tricuspid regurgitation    Vitamin D deficiency    Past Surgical History:  Procedure Laterality Date   CESAREAN SECTION  1994   COLONOSCOPY WITH PROPOFOL N/A 05/29/2015   Procedure: COLONOSCOPY WITH PROPOFOL;  Surgeon: Manya Silvas, MD;  Location: [place];  Service: Endoscopy;  Laterality: N/A;   DILATION AND CURETTAGE OF UTERUS  1992   DILATION AND CURETTAGE OF UTERUS  1993   DILATION AND CURETTAGE OF UTERUS  2011   VEIN SURGERY     Family History  Problem Relation Age of Onset   Heart disease Father        myocardial infarction   Arthritis/Rheumatoid Father    Hypertension Father    Hyperlipidemia Mother    Breast cancer Maternal Grandmother    Colon cancer Neg Hx    Social History   Socioeconomic  History   Marital status: Married    Spouse name: Not on file   Number of children: 1   Years of education: Not on file   Highest education level: Not on file  Occupational History   Not on file  Tobacco Use   Smoking status: Never   Smokeless tobacco: Never  Substance and Sexual Activity   Alcohol use: No    Alcohol/week: 0.0 standard drinks   Drug use: No   Sexual activity: Not on file  Other Topics Concern   Not on file  Social History Narrative   Not on file   Social Determinants of Health   Financial Resource Strain: Not on file  Food Insecurity: Not on file  Transportation Needs: Not on file  Physical Activity: Not on file  Stress: Not on file  Social Connections: Not on file     Review of Systems  Constitutional:  Positive for fatigue. Negative for appetite change and fever.  HENT:  Negative for congestion and sinus pressure.   Respiratory:  Negative for cough, chest tightness and shortness of breath.   Cardiovascular:  Negative for chest pain, palpitations and leg swelling.  Gastrointestinal:  Negative for abdominal pain, diarrhea, nausea and vomiting.  Genitourinary:  Negative for difficulty urinating and dysuria.  Musculoskeletal:  Negative for joint swelling and myalgias.  Skin:  Negative for color change and rash.  Neurological:  Negative for dizziness, light-headedness and headaches.  Psychiatric/Behavioral:         Increased stress.  Increased anxiety.        Objective:     BP 132/76   Pulse 89   Temp 97.8 F (36.6 C)   Resp 16   Wt 157 lb 6.4 oz (71.4 kg)   LMP 02/14/2010   SpO2 98%   BMI 27.02 kg/m  Wt Readings from Last 3 Encounters:  05/31/21 157 lb 3.2 oz (71.3 kg)  05/28/21 157 lb 6.4 oz (71.4 kg)  05/25/21 158 lb (71.7 kg)    Physical Exam Vitals reviewed.  Constitutional:      General: She is not in acute distress.    Appearance: Normal appearance.  HENT:     Head: Normocephalic and atraumatic.     Right Ear: External ear  normal.     Left Ear: External ear normal.  Eyes:     General: No scleral icterus.       Right eye: No discharge.        Left eye: No discharge.     Conjunctiva/sclera: Conjunctivae normal.  Neck:     Thyroid: No thyromegaly.  Cardiovascular:     Rate and Rhythm: Normal rate and regular rhythm.  Pulmonary:     Effort: No respiratory distress.     Breath sounds: Normal breath sounds. No wheezing.  Abdominal:     General: Bowel sounds are normal.     Palpations: Abdomen is soft.     Tenderness: There is no abdominal tenderness.  Musculoskeletal:        General: No swelling or tenderness.     Cervical back: Neck supple. No tenderness.  Lymphadenopathy:     Cervical: No cervical adenopathy.  Skin:    Findings: No erythema or rash.  Neurological:     Mental Status: She is alert.  Psychiatric:        Mood and Affect: Mood normal.        Behavior: Behavior normal.     Outpatient Encounter Medications as of 05/28/2021  Medication Sig   ALPRAZolam (XANAX) 0.25 MG tablet Take 1 tablet (0.25 mg total) by mouth 2 (two) times daily as needed for anxiety.   amLODipine (NORVASC) 10 MG tablet Take 5 mg by mouth daily.    azelastine (ASTELIN) 0.1 % nasal spray PLACE 1 SPRAY INTO BOTH NOSTRILS 2 (TWO) TIMES DAILY. USE IN EACH NOSTRIL AS DIRECTED   Biotin 5000 MCG TABS    Calcium-Magnesium-Vitamin D (CALCIUM 500 PO) Take by mouth.   Cholecalciferol (VITAMIN D) 50 MCG (2000 UT) tablet Take 2,000 Units by mouth daily.   Cranberry 400 MG CAPS Take by mouth.   fish oil-omega-3 fatty acids 1000 MG capsule Take 1 g by mouth daily.   glucosamine-chondroitin 500-400 MG tablet Take 1 tablet by mouth daily.   hydrOXYzine (ATARAX/VISTARIL) 25 MG tablet Take by mouth.   MISC NATURAL PRODUCTS PO Herbal Supplement   Multiple Vitamin (MULTIVITAMIN) tablet Take 1 tablet by mouth daily.   mupirocin ointment (BACTROBAN) 2 % Apply topically daily.   nitroGLYCERIN (NITROGLYN) 2 % ointment Apply 0.5 inches  topically 3 (three) times daily as needed.    omeprazole (PRILOSEC) 20 MG capsule Take 20 mg by mouth 2 (two) times daily.    Probiotic Product (PROBIOTIC PO) Take by mouth.   sertraline (ZOLOFT) 25 MG tablet Take 1 tablet (25 mg total) by  mouth daily.   No facility-administered encounter medications on file as of 05/28/2021.     Lab Results  Component Value Date   WBC 12.3 (H) 05/25/2021   HGB 13.4 05/25/2021   HCT 40.5 05/25/2021   PLT 250.0 05/25/2021   GLUCOSE 89 05/25/2021   CHOL 189 03/30/2020   TRIG 112.0 03/30/2020   HDL 57.30 03/30/2020   LDLCALC 109 (H) 03/30/2020   ALT 11 05/25/2021   AST 13 05/25/2021   NA 140 05/25/2021   K 4.1 05/25/2021   CL 103 05/25/2021   CREATININE 0.79 05/25/2021   BUN 10 05/25/2021   CO2 29 05/25/2021   TSH 1.14 05/25/2021       Assessment & Plan:   Problem List Items Addressed This Visit     Anxiety    Increased stress and anxiety as outlined.  Discussed.  Just started zoloft.  Continue.  Feels needs something that acts more immediate.  rx for xanax to have if needed.  Follow.        Relevant Medications   ALPRAZolam (XANAX) 0.25 MG tablet   GERD (gastroesophageal reflux disease)    Upper symptoms controlled on prilosec.       Raynauds phenomenon    On low dose amlodipine.  Completed doxycycline.  Continue bactroban.       Scleroderma ([place])    Followed by Dr Jefm Bryant.  On low dose amlodipine.  Previous infection - finger.  Had been on doxycycline.  Off now.  Continue bactroban.         Einar Pheasant, MD

## 2021-05-28 NOTE — Assessment & Plan Note (Signed)
Followed by Dr Jefm Bryant.  On low dose amlodipine.  Previous infection - finger.  Had been on doxycycline.  Off now.  Continue bactroban.

## 2021-05-28 NOTE — Assessment & Plan Note (Signed)
Increased fatigue.  Recent covid.  Persistent loose stools.  Check routine labs and recheck c.diff.

## 2021-05-28 NOTE — Assessment & Plan Note (Signed)
Upper symptoms controlled on prilosec.  

## 2021-05-28 NOTE — Assessment & Plan Note (Signed)
Increased heart rate and palpitations as outlined.  Continuing.  Recent EKG - unrevealing.  Discussed increased stress and anxiety.  Given persistent issues, discussed cardiology evaluation to confirm if any further cardiac w/u warranted.  Discussed possible monitor, echo, etc - given recent covid infection.

## 2021-05-28 NOTE — Assessment & Plan Note (Signed)
Persistent diarrhea.  Recently treated with 3 weeks of doxycycline.  Continue probiotic.  More soft stool today.  Recheck c.diff to confirm negative.  Consider adding fiber to help bulk up stool.  Await lab results first.

## 2021-05-28 NOTE — Telephone Encounter (Signed)
Called patient. Confirmed doing ok. Scheduled patient with Dr Nicki Reaper this afternoon. Appt scheduled with cardiology 11/15 at 9:00

## 2021-05-31 ENCOUNTER — Other Ambulatory Visit: Payer: Self-pay

## 2021-05-31 ENCOUNTER — Encounter: Payer: Self-pay | Admitting: Internal Medicine

## 2021-05-31 ENCOUNTER — Ambulatory Visit (INDEPENDENT_AMBULATORY_CARE_PROVIDER_SITE_OTHER): Payer: BC Managed Care – PPO | Admitting: Internal Medicine

## 2021-05-31 VITALS — BP 99/60 | HR 73 | Temp 98.3°F | Ht 64.0 in | Wt 157.2 lb

## 2021-05-31 DIAGNOSIS — R002 Palpitations: Secondary | ICD-10-CM | POA: Diagnosis not present

## 2021-05-31 DIAGNOSIS — M349 Systemic sclerosis, unspecified: Secondary | ICD-10-CM | POA: Diagnosis not present

## 2021-05-31 DIAGNOSIS — F419 Anxiety disorder, unspecified: Secondary | ICD-10-CM

## 2021-05-31 DIAGNOSIS — Z0000 Encounter for general adult medical examination without abnormal findings: Secondary | ICD-10-CM

## 2021-05-31 DIAGNOSIS — K2100 Gastro-esophageal reflux disease with esophagitis, without bleeding: Secondary | ICD-10-CM

## 2021-05-31 MED ORDER — NYSTATIN 100000 UNIT/ML MT SUSP: 5.0000 mL | Freq: Three times a day (TID) | OROMUCOSAL | 0 refills | Status: DC

## 2021-05-31 NOTE — Progress Notes (Signed)
Patient ID: JAZZMINE KLEIMAN, female   DOB: Feb 04, 1961, 60 y.o.   MRN: [id number]   Subjective:    Patient ID: Bing Matter Enge, female    DOB: Aug 04, 1960, 60 y.o.   MRN: [id number]  This visit occurred during the SARS-CoV-2 public health emergency.  Safety protocols were in place, including screening questions prior to the visit, additional usage of staff PPE, and extensive cleaning of exam room while observing appropriate contact time as indicated for disinfecting solutions.   Patient here for her physical exam.   Chief Complaint  Patient presents with   Annual Exam   .   HPI Increased stress/anxiety recently.  S/p post covid.  Not exercising regularly.  Blood pressure has been elevated.  Increased palpitations.  Has appt to see cardiology tomorrow.  On zoloft.  Was given xanax at the end of last week.  Helping some.  Breathing stable.  No increased cough or congestion.  No abdominal pain.  Bowels moving.  Discussed referral to psychiatry.    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HCC)    Scleroderma (HCC)    positive FANA, positive SCL-70 abs, raynauds, mild pulmonary hypertension, normal DLCO   Tricuspid regurgitation    Vitamin D deficiency    Past Surgical History:  Procedure Laterality Date   CESAREAN SECTION  1994   COLONOSCOPY WITH PROPOFOL N/A 05/29/2015   Procedure: COLONOSCOPY WITH PROPOFOL;  Surgeon: Manya Silvas, MD;  Location: [place];  Service: Endoscopy;  Laterality: N/A;   DILATION AND CURETTAGE OF UTERUS  1992   DILATION AND CURETTAGE OF UTERUS  1993   DILATION AND CURETTAGE OF UTERUS  2011   VEIN SURGERY     Family History  Problem Relation Age of Onset   Heart disease Father        myocardial infarction   Arthritis/Rheumatoid Father    Hypertension Father    Hyperlipidemia Mother    Breast cancer Maternal Grandmother     Colon cancer Neg Hx    Social History   Socioeconomic History   Marital status: Married    Spouse name: Not on file   Number of children: 1   Years of education: Not on file   Highest education level: Not on file  Occupational History   Not on file  Tobacco Use   Smoking status: Never   Smokeless tobacco: Never  Substance and Sexual Activity   Alcohol use: No    Alcohol/week: 0.0 standard drinks   Drug use: No   Sexual activity: Not on file  Other Topics Concern   Not on file  Social History Narrative   Not on file   Social Determinants of Health   Financial Resource Strain: Not on file  Food Insecurity: Not on file  Transportation Needs: Not on file  Physical Activity: Not on file  Stress: Not on file  Social Connections: Not on file     Review of Systems  Constitutional:  Negative for appetite change and unexpected weight change.  HENT:  Negative for congestion, sinus pressure and sore throat.   Eyes:  Negative for pain and visual disturbance.  Respiratory:  Negative for cough, chest tightness and shortness of breath.   Cardiovascular:  Negative for chest pain, palpitations and leg swelling.  Gastrointestinal:  Negative for abdominal pain, diarrhea, nausea and vomiting.  Genitourinary:  Negative for difficulty urinating and dysuria.  Musculoskeletal:  Negative for joint swelling and myalgias.  Skin:  Negative for color change and rash.  Neurological:  Negative for dizziness, light-headedness and headaches.  Hematological:  Negative for adenopathy. Does not bruise/bleed easily.  Psychiatric/Behavioral:  Negative for agitation and dysphoric mood.       Objective:     BP 99/60   Pulse 73   Temp 98.3 F (36.8 C) (Oral)   Ht 5\' 4"  (1.626 m)   Wt 157 lb 3.2 oz (71.3 kg)   LMP 02/14/2010   SpO2 96%   BMI 26.98 kg/m  Wt Readings from Last 3 Encounters:  05/31/21 157 lb 3.2 oz (71.3 kg)  05/28/21 157 lb 6.4 oz (71.4 kg)  05/25/21 158 lb (71.7 kg)     Physical Exam Vitals reviewed.  Constitutional:      General: She is not in acute distress.    Appearance: Normal appearance. She is well-developed.  HENT:     Head: Normocephalic and atraumatic.     Right Ear: External ear normal.     Left Ear: External ear normal.  Eyes:     General: No scleral icterus.       Right eye: No discharge.        Left eye: No discharge.     Conjunctiva/sclera: Conjunctivae normal.  Neck:     Thyroid: No thyromegaly.  Cardiovascular:     Rate and Rhythm: Normal rate and regular rhythm.  Pulmonary:     Effort: No tachypnea, accessory muscle usage or respiratory distress.     Breath sounds: Normal breath sounds. No decreased breath sounds or wheezing.  Chest:  Breasts:    Right: No inverted nipple, mass, nipple discharge or tenderness (no axillary adenopathy).     Left: No inverted nipple, mass, nipple discharge or tenderness (no axilarry adenopathy).  Abdominal:     General: Bowel sounds are normal.     Palpations: Abdomen is soft.     Tenderness: There is no abdominal tenderness.  Musculoskeletal:        General: No swelling or tenderness.     Cervical back: Neck supple.  Lymphadenopathy:     Cervical: No cervical adenopathy.  Skin:    Findings: No erythema or rash.  Neurological:     Mental Status: She is alert and oriented to person, place, and time.  Psychiatric:        Mood and Affect: Mood normal.        Behavior: Behavior normal.     Outpatient Encounter Medications as of 05/31/2021  Medication Sig   ALPRAZolam (XANAX) 0.25 MG tablet Take 1 tablet (0.25 mg total) by mouth 2 (two) times daily as needed for anxiety.   amLODipine (NORVASC) 10 MG tablet Take 5 mg by mouth daily.    azelastine (ASTELIN) 0.1 % nasal spray PLACE 1 SPRAY INTO BOTH NOSTRILS 2 (TWO) TIMES DAILY. USE IN EACH NOSTRIL AS DIRECTED   Biotin 5000 MCG TABS    Calcium-Magnesium-Vitamin D (CALCIUM 500 PO) Take by mouth.   Cholecalciferol (VITAMIN D) 50 MCG  (2000 UT) tablet Take 2,000 Units by mouth daily.   Cranberry 400 MG CAPS Take by mouth.   fish oil-omega-3 fatty acids 1000 MG capsule Take 1 g by mouth daily.   glucosamine-chondroitin 500-400 MG tablet Take 1 tablet by mouth daily.   hydrOXYzine (ATARAX/VISTARIL) 25 MG tablet Take by mouth.   MISC NATURAL PRODUCTS PO Herbal Supplement   Multiple  Vitamin (MULTIVITAMIN) tablet Take 1 tablet by mouth daily.   mupirocin ointment (BACTROBAN) 2 % Apply topically daily.   nitroGLYCERIN (NITROGLYN) 2 % ointment Apply 0.5 inches topically 3 (three) times daily as needed.    nystatin (MYCOSTATIN) 100000 UNIT/ML suspension Take 5 mLs (500,000 Units total) by mouth 3 (three) times daily. Swish and spit   omeprazole (PRILOSEC) 20 MG capsule Take 20 mg by mouth 2 (two) times daily.    Probiotic Product (PROBIOTIC PO) Take by mouth.   sertraline (ZOLOFT) 25 MG tablet Take 1 tablet (25 mg total) by mouth daily.   No facility-administered encounter medications on file as of 05/31/2021.     Lab Results  Component Value Date   WBC 12.3 (H) 05/25/2021   HGB 13.4 05/25/2021   HCT 40.5 05/25/2021   PLT 250.0 05/25/2021   GLUCOSE 89 05/25/2021   CHOL 189 03/30/2020   TRIG 112.0 03/30/2020   HDL 57.30 03/30/2020   LDLCALC 109 (H) 03/30/2020   ALT 11 05/25/2021   AST 13 05/25/2021   NA 140 05/25/2021   K 4.1 05/25/2021   CL 103 05/25/2021   CREATININE 0.79 05/25/2021   BUN 10 05/25/2021   CO2 29 05/25/2021   TSH 1.14 05/25/2021       Assessment & Plan:   Problem List Items Addressed This Visit     Anxiety    Increased stress and anxiety as outlined.  Discussed.  Just started zoloft.  Continue.  Discussed increasing dose to 50mg  q day.  Was given rx for xanax last week.  Continue prn use.  Follow. Agreeable for psych referral.        Relevant Orders   Ambulatory referral to Psychiatry   GERD (gastroesophageal reflux disease)    Upper symptoms controlled on prilosec.       Health care  maintenance    Physical today 05/31/21.   PAP 02/2019 negative with negative HPV.  Colonoscopy 05/2015 - diminutive polyp, diverticulosis and internal hemorrhoids.  Colonoscopy 03/23/21. Mammogram 01/28/21 - Birads I.       Palpitations    Increased heart rate and palpitations recently.  Recent EKG - unrevealing.  Discussed increased stress and anxiety. Has cardiology appt tomorrow.   Discussed possible monitor, echo, etc - given recent covid infection.        Scleroderma ([place] )    Followed by Dr Jefm Bryant.  On low dose amlodipine.  Previous infection - finger.  Had been on doxycycline.  Off now.  Continue bactroban.       Other Visit Diagnoses     Routine general medical examination at a health care facility    -  Primary        Einar Pheasant, MD

## 2021-06-06 ENCOUNTER — Encounter: Payer: Self-pay | Admitting: Internal Medicine

## 2021-06-06 DIAGNOSIS — F419 Anxiety disorder, unspecified: Secondary | ICD-10-CM | POA: Insufficient documentation

## 2021-06-06 NOTE — Assessment & Plan Note (Signed)
Upper symptoms controlled on prilosec.  

## 2021-06-06 NOTE — Assessment & Plan Note (Signed)
Increased stress and anxiety as outlined.  Discussed.  Just started zoloft.  Continue.  Feels needs something that acts more immediate.  rx for xanax to have if needed.  Follow.

## 2021-06-06 NOTE — Assessment & Plan Note (Signed)
Followed by Dr Jefm Bryant.  On low dose amlodipine.  Previous infection - finger.  Had been on doxycycline.  Off now.  Continue bactroban.

## 2021-06-06 NOTE — Assessment & Plan Note (Signed)
Increased stress and anxiety as outlined.  Discussed.  Just started zoloft.  Continue.  Discussed increasing dose to 50mg  q day.  Was given rx for xanax last week.  Continue prn use.  Follow. Agreeable for psych referral.

## 2021-06-06 NOTE — Assessment & Plan Note (Signed)
Physical today 05/31/21.   PAP 02/2019 negative with negative HPV.  Colonoscopy 05/2015 - diminutive polyp, diverticulosis and internal hemorrhoids.  Colonoscopy 03/23/21. Mammogram 01/28/21 - Birads I.

## 2021-06-06 NOTE — Assessment & Plan Note (Signed)
Increased heart rate and palpitations recently.  Recent EKG - unrevealing.  Discussed increased stress and anxiety. Has cardiology appt tomorrow.   Discussed possible monitor, echo, etc - given recent covid infection.

## 2021-06-06 NOTE — Assessment & Plan Note (Signed)
On low dose amlodipine.  Completed doxycycline.  Continue bactroban.

## 2021-06-08 ENCOUNTER — Ambulatory Visit (INDEPENDENT_AMBULATORY_CARE_PROVIDER_SITE_OTHER): Payer: BC Managed Care – PPO | Admitting: Internal Medicine

## 2021-06-08 ENCOUNTER — Other Ambulatory Visit: Payer: Self-pay

## 2021-06-08 DIAGNOSIS — I7300 Raynaud's syndrome without gangrene: Secondary | ICD-10-CM | POA: Diagnosis not present

## 2021-06-08 DIAGNOSIS — K2100 Gastro-esophageal reflux disease with esophagitis, without bleeding: Secondary | ICD-10-CM

## 2021-06-08 DIAGNOSIS — M349 Systemic sclerosis, unspecified: Secondary | ICD-10-CM

## 2021-06-08 DIAGNOSIS — F419 Anxiety disorder, unspecified: Secondary | ICD-10-CM

## 2021-06-08 DIAGNOSIS — R002 Palpitations: Secondary | ICD-10-CM

## 2021-06-08 MED ORDER — SERTRALINE HCL 50 MG PO TABS: 50.0000 mg | ORAL_TABLET | Freq: Every day | ORAL | 2 refills | Status: DC

## 2021-06-08 NOTE — Progress Notes (Signed)
Patient ID: Denise Arnold, female   DOB: 13-Sep-1960, 60 y.o.   MRN: [id number]   Subjective:    Patient ID: Denise Arnold, female    DOB: [birth year], 60 y.o.   MRN: [id number]  This visit occurred during the SARS-CoV-2 public health emergency.  Safety protocols were in place, including screening questions prior to the visit, additional usage of staff PPE, and extensive cleaning of exam room while observing appropriate contact time as indicated for disinfecting solutions.   Patient here for scheduled follow up.   Chief Complaint  Patient presents with   Anxiety   .   HPI Still with increased anxiety.  Overall doing some better.  Has started exercising some.  Going out a little more.  Taking zoloft.  Tolerating 25mg .  Discussed increasing to 50mg  q day.  Saw cardiology.  Wore monitor.  Felt a lot of symptoms were being aggravated by anxiety.  No chest pain.  Breathing stable.  No acid reflux.  No abdominal pain.  Bowels moving.  Agreeable to psychiatry referral.  Order has been placed.     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HCC)    Scleroderma (HCC)    positive FANA, positive SCL-70 abs, raynauds, mild pulmonary hypertension, normal DLCO   Tricuspid regurgitation    Vitamin D deficiency    Past Surgical History:  Procedure Laterality Date   CESAREAN SECTION  1994   COLONOSCOPY WITH PROPOFOL N/A 05/29/2015   Procedure: COLONOSCOPY WITH PROPOFOL;  Surgeon: Manya Silvas, MD;  Location: [place];  Service: Endoscopy;  Laterality: N/A;   DILATION AND CURETTAGE OF UTERUS  1992   DILATION AND CURETTAGE OF UTERUS  1993   DILATION AND CURETTAGE OF UTERUS  2011   VEIN SURGERY     Family History  Problem Relation Age of Onset   Heart disease Father        myocardial infarction   Arthritis/Rheumatoid Father    Hypertension Father     Hyperlipidemia Mother    Breast cancer Maternal Grandmother    Colon cancer Neg Hx    Social History   Socioeconomic History   Marital status: Married    Spouse name: Not on file   Number of children: 1   Years of education: Not on file   Highest education level: Not on file  Occupational History   Not on file  Tobacco Use   Smoking status: Never   Smokeless tobacco: Never  Substance and Sexual Activity   Alcohol use: No    Alcohol/week: 0.0 standard drinks   Drug use: No   Sexual activity: Not on file  Other Topics Concern   Not on file  Social History Narrative   Not on file   Social Determinants of Health   Financial Resource Strain: Not on file  Food Insecurity: Not on file  Transportation Needs: Not on file  Physical Activity: Not on file  Stress: Not on file  Social Connections: Not on file     Review of Systems  Constitutional:  Negative for appetite change and unexpected weight change.  HENT:  Negative for congestion and sinus pressure.   Respiratory:  Negative for cough, chest tightness and shortness of breath.   Cardiovascular:  Negative for chest pain and leg swelling.       Increased palpitations as  outlined.    Gastrointestinal:  Negative for abdominal pain, diarrhea, nausea and vomiting.  Genitourinary:  Negative for difficulty urinating and dysuria.  Musculoskeletal:  Negative for joint swelling and myalgias.  Skin:  Negative for color change and rash.  Neurological:  Negative for dizziness, light-headedness and headaches.  Psychiatric/Behavioral:  Negative for dysphoric mood.        Increased anxiety as outlined.        Objective:     BP 112/70   Pulse 86   Temp (!) 97 F (36.1 C)   Resp 16   Ht 5\' 4"  (1.626 m)   Wt 155 lb 12.8 oz (70.7 kg)   LMP 02/14/2010   SpO2 96%   BMI 26.74 kg/m  Wt Readings from Last 3 Encounters:  06/08/21 155 lb 12.8 oz (70.7 kg)  05/31/21 157 lb 3.2 oz (71.3 kg)  05/28/21 157 lb 6.4 oz (71.4 kg)     Physical Exam Vitals reviewed.  Constitutional:      General: She is not in acute distress.    Appearance: Normal appearance.  HENT:     Head: Normocephalic and atraumatic.     Right Ear: External ear normal.     Left Ear: External ear normal.  Eyes:     General: No scleral icterus.       Right eye: No discharge.        Left eye: No discharge.     Conjunctiva/sclera: Conjunctivae normal.  Neck:     Thyroid: No thyromegaly.  Cardiovascular:     Rate and Rhythm: Normal rate and regular rhythm.  Pulmonary:     Effort: No respiratory distress.     Breath sounds: Normal breath sounds. No wheezing.  Abdominal:     General: Bowel sounds are normal.     Palpations: Abdomen is soft.     Tenderness: There is no abdominal tenderness.  Musculoskeletal:        General: No swelling or tenderness.     Cervical back: Neck supple. No tenderness.  Lymphadenopathy:     Cervical: No cervical adenopathy.  Skin:    Findings: No erythema or rash.  Neurological:     Mental Status: She is alert.  Psychiatric:        Mood and Affect: Mood normal.        Behavior: Behavior normal.     Outpatient Encounter Medications as of 06/08/2021  Medication Sig   sertraline (ZOLOFT) 50 MG tablet Take 1 tablet (50 mg total) by mouth daily.   ALPRAZolam (XANAX) 0.25 MG tablet Take 1 tablet (0.25 mg total) by mouth 2 (two) times daily as needed for anxiety.   amLODipine (NORVASC) 10 MG tablet Take 5 mg by mouth daily.    azelastine (ASTELIN) 0.1 % nasal spray PLACE 1 SPRAY INTO BOTH NOSTRILS 2 (TWO) TIMES DAILY. USE IN EACH NOSTRIL AS DIRECTED   Biotin 5000 MCG TABS    Calcium-Magnesium-Vitamin D (CALCIUM 500 PO) Take by mouth.   Cholecalciferol (VITAMIN D) 50 MCG (2000 UT) tablet Take 2,000 Units by mouth daily.   Cranberry 400 MG CAPS Take by mouth.   fish oil-omega-3 fatty acids 1000 MG capsule Take 1 g by mouth daily.   glucosamine-chondroitin 500-400 MG tablet Take 1 tablet by mouth daily.    hydrOXYzine (ATARAX/VISTARIL) 25 MG tablet Take by mouth.   MISC NATURAL PRODUCTS PO Herbal Supplement   Multiple Vitamin (MULTIVITAMIN) tablet Take 1 tablet by mouth daily.   mupirocin ointment (BACTROBAN) 2 %  Apply topically daily.   nitroGLYCERIN (NITROGLYN) 2 % ointment Apply 0.5 inches topically 3 (three) times daily as needed.    nystatin (MYCOSTATIN) 100000 UNIT/ML suspension Take 5 mLs (500,000 Units total) by mouth 3 (three) times daily. Swish and spit   omeprazole (PRILOSEC) 20 MG capsule Take 20 mg by mouth 2 (two) times daily.    Probiotic Product (PROBIOTIC PO) Take by mouth.   [DISCONTINUED] sertraline (ZOLOFT) 25 MG tablet Take 1 tablet (25 mg total) by mouth daily.   No facility-administered encounter medications on file as of 06/08/2021.     Lab Results  Component Value Date   WBC 12.3 (H) 05/25/2021   HGB 13.4 05/25/2021   HCT 40.5 05/25/2021   PLT 250.0 05/25/2021   GLUCOSE 89 05/25/2021   CHOL 189 03/30/2020   TRIG 112.0 03/30/2020   HDL 57.30 03/30/2020   LDLCALC 109 (H) 03/30/2020   ALT 11 05/25/2021   AST 13 05/25/2021   NA 140 05/25/2021   K 4.1 05/25/2021   CL 103 05/25/2021   CREATININE 0.79 05/25/2021   BUN 10 05/25/2021   CO2 29 05/25/2021   TSH 1.14 05/25/2021       Assessment & Plan:   Problem List Items Addressed This Visit     Anxiety    Increased anxiety/stress.  Discussed.  Order has been placed for referral to psych.  On zoloft.  Increase to 50mg  q day.  Follow.        Relevant Medications   sertraline (ZOLOFT) 50 MG tablet   GERD (gastroesophageal reflux disease)    Upper symptoms controlled on prilosec.       Palpitations    Increased heart rate and palpitations recently.  Recent EKG - unrevealing.  Saw cardiology.  Wore monitor.  Results pending.  Increase zoloft as outlined.  Follow.       Raynauds phenomenon    On low dose amlodipine.  Continue bactroban.       Scleroderma ([place])    Followed by Dr Jefm Bryant.  On low dose  amlodipine.  Previous infection - finger.  Had been on doxycycline.  Off now.  Continue bactroban. Saw cardiology.  Planning for continued f/u.         Einar Pheasant, MD

## 2021-06-13 ENCOUNTER — Encounter: Payer: Self-pay | Admitting: Internal Medicine

## 2021-06-13 NOTE — Assessment & Plan Note (Signed)
Increased heart rate and palpitations recently.  Recent EKG - unrevealing.  Saw cardiology.  Wore monitor.  Results pending.  Increase zoloft as outlined.  Follow.

## 2021-06-13 NOTE — Assessment & Plan Note (Signed)
Upper symptoms controlled on prilosec.  

## 2021-06-13 NOTE — Assessment & Plan Note (Signed)
On low dose amlodipine.  Continue bactroban.

## 2021-06-13 NOTE — Assessment & Plan Note (Signed)
Followed by Dr Jefm Bryant.  On low dose amlodipine.  Previous infection - finger.  Had been on doxycycline.  Off now.  Continue bactroban. Saw cardiology.  Planning for continued f/u.

## 2021-06-13 NOTE — Assessment & Plan Note (Signed)
Increased anxiety/stress.  Discussed.  Order has been placed for referral to psych.  On zoloft.  Increase to 50mg  q day.  Follow.

## 2021-06-15 ENCOUNTER — Other Ambulatory Visit: Payer: Self-pay | Admitting: Internal Medicine

## 2021-06-15 NOTE — Telephone Encounter (Signed)
RX Refill: xanax Last Seen: 06-08-21 Last Ordered: 05-28-21 Next Appt: 06-22-21

## 2021-06-16 ENCOUNTER — Ambulatory Visit (INDEPENDENT_AMBULATORY_CARE_PROVIDER_SITE_OTHER): Payer: BC Managed Care – PPO | Admitting: Dermatology

## 2021-06-16 ENCOUNTER — Other Ambulatory Visit: Payer: Self-pay

## 2021-06-16 DIAGNOSIS — D229 Melanocytic nevi, unspecified: Secondary | ICD-10-CM

## 2021-06-16 DIAGNOSIS — I7300 Raynaud's syndrome without gangrene: Secondary | ICD-10-CM

## 2021-06-16 DIAGNOSIS — Z1283 Encounter for screening for malignant neoplasm of skin: Secondary | ICD-10-CM

## 2021-06-16 DIAGNOSIS — L905 Scar conditions and fibrosis of skin: Secondary | ICD-10-CM

## 2021-06-16 DIAGNOSIS — L309 Dermatitis, unspecified: Secondary | ICD-10-CM

## 2021-06-16 DIAGNOSIS — D2271 Melanocytic nevi of right lower limb, including hip: Secondary | ICD-10-CM | POA: Diagnosis not present

## 2021-06-16 DIAGNOSIS — D1800 Hemangioma unspecified site: Secondary | ICD-10-CM

## 2021-06-16 DIAGNOSIS — D2239 Melanocytic nevi of other parts of face: Secondary | ICD-10-CM

## 2021-06-16 DIAGNOSIS — B351 Tinea unguium: Secondary | ICD-10-CM

## 2021-06-16 DIAGNOSIS — M349 Systemic sclerosis, unspecified: Secondary | ICD-10-CM

## 2021-06-16 DIAGNOSIS — L578 Other skin changes due to chronic exposure to nonionizing radiation: Secondary | ICD-10-CM

## 2021-06-16 DIAGNOSIS — D225 Melanocytic nevi of trunk: Secondary | ICD-10-CM | POA: Diagnosis not present

## 2021-06-16 DIAGNOSIS — L814 Other melanin hyperpigmentation: Secondary | ICD-10-CM

## 2021-06-16 MED ORDER — TAVABOROLE 5 % EX SOLN: CUTANEOUS | 2 refills | Status: DC

## 2021-06-16 MED ORDER — CLOBETASOL PROPIONATE 0.05 % EX CREA: TOPICAL_CREAM | CUTANEOUS | 0 refills | Status: DC

## 2021-06-16 NOTE — Progress Notes (Signed)
Follow-Up Visit   Subjective  Denise Arnold is a 60 y.o. female who presents for the following: Annual Exam.  Patient here for TBSE. She had a history of sore on her leg that she hit when shaving, improved now. No history of skin cancer.   The following portions of the chart were reviewed this encounter and updated as appropriate:       Review of Systems:  No other skin or systemic complaints except as noted in HPI or Assessment and Plan.  Objective  Well appearing patient in no apparent distress; mood and affect are within normal limits.  A full examination was performed including scalp, head, eyes, ears, nose, lips, neck, chest, axillae, abdomen, back, buttocks, bilateral upper extremities, bilateral lower extremities, hands, feet, fingers, toes, fingernails, and toenails. All findings within normal limits unless otherwise noted below.  R abdomen, R post thigh, L lower cheek Right Abdomen: 6.61mm light brown macule darker center   Right Posterior Thigh : 3.37mm medium brown macule   L lower cheek: 6.39mm flesh pap         hands, face Telangiectasias of the cheeks; perioral rhytids, blanching of the fingers; atrophic fingertips with skin tightening.   toenails Toenail dystrophy of the R great toenail. See photo     elbows Pink scaly patches BL elbows.   Assessment & Plan  Skin cancer screening performed today.  Actinic Damage - chronic, secondary to cumulative UV radiation exposure/sun exposure over time - diffuse scaly erythematous macules with underlying dyspigmentation - Recommend daily broad spectrum sunscreen SPF 30+ to sun-exposed areas, reapply every 2 hours as needed.  - Recommend staying in the shade or wearing long sleeves, sun glasses (UVA+UVB protection) and wide brim hats (4-inch brim around the entire circumference of the hat). - Call for new or changing lesions.  Lentigines - Scattered tan macules - Due to sun exposure - Benign-appering,  observe - Recommend daily broad spectrum sunscreen SPF 30+ to sun-exposed areas, reapply every 2 hours as needed. - Call for any changes  Melanocytic Nevi - Tan-brown and/or pink-flesh-colored symmetric macules and papules - Benign appearing on exam today - Observation - Call clinic for new or changing moles - Recommend daily use of broad spectrum spf 30+ sunscreen to sun-exposed areas.   Seborrheic Keratoses - Stuck-on, waxy, tan-brown papules and/or plaques  - Benign-appearing - Discussed benign etiology and prognosis. - Observe - Call for any changes  Nevus R abdomen, R post thigh, L lower cheek  Benign-appearing.  Stable. Observation.  Call clinic for new or changing moles.  Recommend daily use of broad spectrum spf 30+ sunscreen to sun-exposed areas.   Scleroderma (HCC) hands, face  With Raynaud's, chronic condition  Continue Norvasc as prescribed by Dr Precious Reel.  Continue gloves prn to keep hands warm  Tinea unguium toenails  Chronic fungal infection of toenail  Discussed oral and topical treatments.  Pt prefers topical. Start Kerydin Solution Apply to affected nails qhs until improved dsp 1mL 2Rf. Rx sent to [facility]   Tavaborole (KERYDIN) 5 % SOLN - toenails Apply to affected toenail every night until improved.  Dermatitis elbows  vs Scleroderma rash  Start Clobetasol Cream Apply to elbows BID prn flares dsp 30g 0Rf. Avoid face, groin, axilla.   Topical steroids (such as triamcinolone, fluocinolone, fluocinonide, mometasone, clobetasol, halobetasol, betamethasone, hydrocortisone) can cause thinning and lightening of the skin if they are used for too long in the same area. Your physician has selected the right strength  medicine for your problem and area affected on the body. Please use your medication only as directed by your physician to prevent side effects.   Recommend mild soap and moisturizing cream 1-2 times daily.  Gentle skin care handout  provided.    clobetasol cream (TEMOVATE) 0.05 % - elbows Apply to elbows twice a day as needed. Avoid face, groin, axilla.  Return in about 1 year (around 06/16/2022) for TBSE.  IJamesetta Orleans, CMA, am acting as scribe for Brendolyn Patty, MD .  Documentation: I have reviewed the above documentation for accuracy and completeness, and I agree with the above.  Brendolyn Patty MD

## 2021-06-16 NOTE — Patient Instructions (Addendum)
Topical steroids (such as triamcinolone, fluocinolone, fluocinonide, mometasone, clobetasol, halobetasol, betamethasone, hydrocortisone) can cause thinning and lightening of the skin if they are used for too long in the same area. Your physician has selected the right strength medicine for your problem and area affected on the body. Please use your medication only as directed by your physician to prevent side effects.    If You Need Anything After Your Visit  If you have any questions or concerns for your doctor, please call our main line at [phone] and press option 4 to reach your doctor's medical assistant. If no one answers, please leave a voicemail as directed and we will return your call as soon as possible. Messages left after 4 pm will be answered the following business day.   You may also send [place] a message via [place]. We typically respond to MyChart messages within 1-2 business days.  For prescription refills, please ask your pharmacy to contact our office. Our fax number is [phone].  If you have an urgent issue when the clinic is closed that cannot wait until the next business day, you can [place] your doctor at the number below.    Please note that while we do our best to be available for urgent issues outside of office hours, we are not available 24/7.   If you have an urgent issue and are unable to reach [place], you may choose to seek medical care at your doctor's office, retail clinic, urgent care center, or emergency room.  If you have a medical emergency, please immediately call 911 or go to the emergency department.  Pager Numbers  - Dr. Nehemiah Massed: [phone]  - Dr. Laurence Ferrari: [phone]  - Dr. Nicole Kindred: [phone]  In the event of inclement weather, please call our main line at [phone] for an update on the status of any delays or closures.  Dermatology Medication Tips: Please keep the boxes that topical medications come in in order to help keep track of the  instructions about where and how to use these. Pharmacies typically print the medication instructions only on the boxes and not directly on the medication tubes.   If your medication is too expensive, please contact our office at [phone] option 4 or send [place] a message through [place].   We are unable to tell what your co-pay for medications will be in advance as this is different depending on your insurance coverage. However, we may be able to find a substitute medication at lower cost or fill out paperwork to get insurance to cover a needed medication.   If a prior authorization is required to get your medication covered by your insurance company, please allow [place] 1-2 business days to complete this process.  Drug prices often vary depending on where the prescription is filled and some pharmacies may offer cheaper prices.  The website www.goodrx.com contains coupons for medications through different pharmacies. The prices here do not account for what the cost may be with help from insurance (it may be cheaper with your insurance), but the website can give you the price if you did not use any insurance.  - You can print the associated coupon and take it with your prescription to the pharmacy.  - You may also stop by our office during regular business hours and pick up a GoodRx coupon card.  - If you need your prescription sent electronically to a different pharmacy, notify our office through [facility] or by phone at [phone] option 4.  Si Usted Necesita Algo Despus de Su Visita  Tambin puede enviarnos un mensaje a travs de Pharmacist, community. Por lo general respondemos a los mensajes de MyChart en el transcurso de 1 a 2 das hbiles.  Para renovar recetas, por favor pida a su farmacia que se ponga en contacto con nuestra oficina. Harland Dingwall de fax es [place] [phone].  Si tiene un asunto urgente cuando la clnica est cerrada y que no puede esperar hasta el siguiente da hbil,  puede llamar/localizar a su doctor(a) al nmero que aparece a continuacin.   Por favor, tenga en cuenta que aunque hacemos todo lo posible para estar disponibles para asuntos urgentes fuera del horario de [place], no estamos disponibles las 24 horas del da, los 7 das de la [place].   Si tiene un problema urgente y no puede comunicarse con nosotros, puede optar por buscar atencin mdica  en el consultorio de su doctor(a), en una clnica privada, en un centro de atencin urgente o en una sala de emergencias.  Si tiene Engineering geologist, por favor llame inmediatamente al 911 o vaya a la sala de emergencias.  Nmeros de bper  - Dr. Nehemiah Massed: [phone]  - Dra. Moye: [phone]  - Dra. Nicole Kindred: [phone]  En caso de inclemencias del [place], por favor llame a Johnsie Kindred principal al [phone] para una actualizacin sobre el [place] de cualquier retraso o cierre.  Consejos para la medicacin en dermatologa: Por favor, guarde las cajas en las que vienen los medicamentos de uso tpico para ayudarle a seguir las instrucciones sobre dnde y cmo usarlos. Las farmacias generalmente imprimen las instrucciones del medicamento slo en las cajas y no directamente en los tubos del [place].   Si su medicamento es muy caro, por favor, pngase en contacto con Zigmund Daniel llamando al [phone] y presione la opcin 4 o envenos un mensaje a travs de Pharmacist, community.   No podemos decirle cul ser su copago por los medicamentos por adelantado ya que esto es diferente dependiendo de la cobertura de su seguro. Sin embargo, es posible que podamos encontrar un medicamento sustituto a Electrical engineer un formulario para que el seguro cubra el medicamento que se considera necesario.   Si se requiere una autorizacin previa para que su compaa de seguros [place] su medicamento, por favor permtanos de 1 a 2 das hbiles para completar este proceso.  Los precios de los medicamentos varan con  frecuencia dependiendo del Environmental consultant de dnde se surte la receta y alguna farmacias pueden ofrecer precios ms baratos.  El sitio web www.goodrx.com tiene cupones para medicamentos de Airline pilot. Los precios aqu no tienen en cuenta lo que podra costar con la ayuda del seguro (puede ser ms barato con su seguro), pero el sitio web puede darle el precio si no utiliz Research scientist (physical sciences).  - Puede imprimir el cupn correspondiente y llevarlo con su receta a la farmacia.  - Tambin puede pasar por nuestra oficina durante el horario de atencin regular y Charity fundraiser una tarjeta de cupones de GoodRx.  - Si necesita que su receta se enve electrnicamente a una farmacia diferente, informe a nuestra oficina a travs de MyChart de  o por telfono llamando al [phone] y presione la opcin 4.

## 2021-06-22 ENCOUNTER — Encounter: Payer: Self-pay | Admitting: Internal Medicine

## 2021-06-22 ENCOUNTER — Ambulatory Visit (INDEPENDENT_AMBULATORY_CARE_PROVIDER_SITE_OTHER): Payer: BC Managed Care – PPO | Admitting: Internal Medicine

## 2021-06-22 ENCOUNTER — Other Ambulatory Visit: Payer: Self-pay

## 2021-06-22 VITALS — BP 128/78 | HR 88 | Temp 98.0°F | Resp 16 | Ht 64.0 in | Wt 159.2 lb

## 2021-06-22 DIAGNOSIS — R519 Headache, unspecified: Secondary | ICD-10-CM

## 2021-06-22 DIAGNOSIS — F419 Anxiety disorder, unspecified: Secondary | ICD-10-CM

## 2021-06-22 DIAGNOSIS — M349 Systemic sclerosis, unspecified: Secondary | ICD-10-CM | POA: Diagnosis not present

## 2021-06-22 DIAGNOSIS — K2100 Gastro-esophageal reflux disease with esophagitis, without bleeding: Secondary | ICD-10-CM | POA: Diagnosis not present

## 2021-06-22 LAB — CBC WITH DIFFERENTIAL/PLATELET
Basophils Absolute: 0 10*3/uL (ref 0.0–0.1)
Basophils Relative: 0.4 % (ref 0.0–3.0)
Eosinophils Absolute: 0.1 10*3/uL (ref 0.0–0.7)
Eosinophils Relative: 1.5 % (ref 0.0–5.0)
HCT: 40.3 % (ref 36.0–46.0)
Hemoglobin: 13 g/dL (ref 12.0–15.0)
Lymphocytes Relative: 21.3 % (ref 12.0–46.0)
Lymphs Abs: 1.5 10*3/uL (ref 0.7–4.0)
MCHC: 32.2 g/dL (ref 30.0–36.0)
MCV: 92.4 fl (ref 78.0–100.0)
Monocytes Absolute: 0.4 10*3/uL (ref 0.1–1.0)
Monocytes Relative: 6.3 % (ref 3.0–12.0)
Neutro Abs: 4.9 10*3/uL (ref 1.4–7.7)
Neutrophils Relative %: 70.5 % (ref 43.0–77.0)
Platelets: 243 10*3/uL (ref 150.0–400.0)
RBC: 4.36 Mil/uL (ref 3.87–5.11)
RDW: 14.5 % (ref 11.5–15.5)
WBC: 6.9 10*3/uL (ref 4.0–10.5)

## 2021-06-22 LAB — BASIC METABOLIC PANEL
BUN: 11 mg/dL (ref 6–23)
CO2: 29 mEq/L (ref 19–32)
Calcium: 9.4 mg/dL (ref 8.4–10.5)
Chloride: 103 mEq/L (ref 96–112)
Creatinine, Ser: 0.78 mg/dL (ref 0.40–1.20)
GFR: 83.06 mL/min (ref >60.00)
Glucose, Bld: 87 mg/dL (ref 70–99)
Potassium: 4.3 mEq/L (ref 3.5–5.1)
Sodium: 139 mEq/L (ref 135–145)

## 2021-06-22 LAB — SEDIMENTATION RATE: Sed Rate: 15 mm/hr (ref 0–30)

## 2021-06-22 NOTE — Progress Notes (Signed)
Patient ID: Denise Arnold, female   DOB: [birth year], 60 y.o.   MRN: [id number]   Subjective:    Patient ID: Denise Arnold, female    DOB: [birth year], 60 y.o.   MRN: [id number]  This visit occurred during the SARS-CoV-2 public health emergency.  Safety protocols were in place, including screening questions prior to the visit, additional usage of staff PPE, and extensive cleaning of exam room while observing appropriate contact time as indicated for disinfecting solutions.   Patient here for a scheduled follow up.  Chief Complaint  Patient presents with   Follow-up   Headache    Since increase in Zoloft or from adding steroid cream from dermatology.   Marland Kitchen   HPI Follow up regarding increased stress and anxiety.  Recently placed on zoloft.  Does increased to 2m q day last visit.  Reports she still feeling "jittery" in am.  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  Also back singing.  Felt good when performing.  Does report - headache - starting last week.  Taking tylenol.  Discomfort over bridge of nose.  No significant nasal congestion or sinus congestion.  No chest congestion or cough.  No increased sob reported.  Overall appears to be doing better.  Eating.  No vomiting.  Was concerned if the increased zoloft of recent initiation of steroid cream - could be contributing to headache.  Has appt with psych - next week.     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HCC)    Scleroderma (HCC)    positive FANA, positive SCL-70 abs, raynauds, mild pulmonary hypertension, normal DLCO   Tricuspid regurgitation    Vitamin D deficiency    Past Surgical History:  Procedure Laterality Date   CESAREAN SECTION  1994   COLONOSCOPY WITH PROPOFOL N/A 05/29/2015   Procedure: COLONOSCOPY WITH PROPOFOL;  Surgeon: RManya Silvas MD;  [facility]  Service: Endoscopy;  Laterality: N/A;   DILATION AND CURETTAGE OF UTERUS  1992   DILATION AND CURETTAGE OF UTERUS  1993   DILATION AND CURETTAGE OF UTERUS  2011   VEIN SURGERY     Family History  Problem Relation Age of Onset   Heart disease Father        myocardial infarction   Arthritis/Rheumatoid Father    Hypertension Father    Hyperlipidemia Mother    Breast cancer Maternal Grandmother    Colon cancer Neg Hx    Social History   Socioeconomic History   Marital status: Married    Spouse name: Not on file   Number of children: 1   Years of education: Not on file   Highest education level: Not on file  Occupational History   Not on file  Tobacco Use   Smoking status: Never   Smokeless tobacco: Never  Substance and Sexual Activity   Alcohol use: No    Alcohol/week: 0.0 standard drinks   Drug use: No   Sexual activity: Not on file  Other Topics Concern   Not on file  Social History Narrative   Not on file   Social Determinants of Health   Financial Resource Strain: Not on file  Food Insecurity: Not on file  Transportation Needs: Not on file  Physical Activity: Not on file  Stress: Not on file  Social Connections: Not on file     Review of Systems  Constitutional:  Negative for appetite change and unexpected weight change.  HENT:  Negative for congestion and sinus pressure.   Respiratory:  Negative for cough, chest tightness and shortness of breath.   Cardiovascular:  Negative for chest pain and leg swelling.  Gastrointestinal:  Negative for abdominal pain, diarrhea, nausea and vomiting.  Genitourinary:  Negative for difficulty urinating and dysuria.  Musculoskeletal:  Negative for joint swelling and myalgias.  Skin:  Negative for color change and rash.  Neurological:  Positive for headaches. Negative for dizziness.  Psychiatric/Behavioral:  Negative for dysphoric mood.        Increased stress and anxiety as outlined.        Objective:      BP 128/78   Pulse 88   Temp 98 F (36.7 C) (Oral)   Resp 16   Ht 5' 4"  (1.626 m)   Wt 159 lb 4 oz (72.2 kg)   LMP 02/14/2010   SpO2 98%   BMI 27.34 kg/m  Wt Readings from Last 3 Encounters:  06/22/21 159 lb 4 oz (72.2 kg)  06/08/21 155 lb 12.8 oz (70.7 kg)  05/31/21 157 lb 3.2 oz (71.3 kg)    Physical Exam Vitals reviewed.  Constitutional:      General: She is not in acute distress.    Appearance: Normal appearance.  HENT:     Head: Normocephalic and atraumatic.     Right Ear: External ear normal.     Left Ear: External ear normal.  Eyes:     General: No scleral icterus.       Right eye: No discharge.        Left eye: No discharge.     Conjunctiva/sclera: Conjunctivae normal.  Neck:     Thyroid: No thyromegaly.  Cardiovascular:     Rate and Rhythm: Normal rate and regular rhythm.  Pulmonary:     Effort: No respiratory distress.     Breath sounds: Normal breath sounds. No wheezing.  Abdominal:     General: Bowel sounds are normal.     Palpations: Abdomen is soft.     Tenderness: There is no abdominal tenderness.  Musculoskeletal:        General: No swelling or tenderness.     Cervical back: Neck supple. No tenderness.  Lymphadenopathy:     Cervical: No cervical adenopathy.  Skin:    Findings: No erythema or rash.  Neurological:     Mental Status: She is alert.  Psychiatric:        Mood and Affect: Mood normal.        Behavior: Behavior normal.     Outpatient Encounter Medications as of 06/22/2021  Medication Sig   ALPRAZolam (XANAX) 0.25 MG tablet TAKE 1 TABLET BY MOUTH 2 TIMES DAILY AS NEEDED FOR ANXIETY.   amLODipine (NORVASC) 10 MG tablet Take 5 mg by mouth daily.    azelastine (ASTELIN) 0.1 % nasal spray PLACE 1 SPRAY INTO BOTH NOSTRILS 2 (TWO) TIMES DAILY. USE IN EACH NOSTRIL AS DIRECTED   Biotin 5000 MCG TABS    Calcium-Magnesium-Vitamin D (CALCIUM 500 PO) Take by mouth.   Cholecalciferol (VITAMIN D) 50 MCG (2000 UT) tablet Take 2,000 Units by  mouth daily.   clobetasol cream (TEMOVATE) 0.05 % Apply to elbows twice a day as needed. Avoid face, groin, axilla.   Cranberry 400 MG CAPS Take by mouth.   fish oil-omega-3 fatty acids 1000 MG capsule  Take 1 g by mouth daily.   glucosamine-chondroitin 500-400 MG tablet Take 1 tablet by mouth daily.   hydrOXYzine (ATARAX/VISTARIL) 25 MG tablet Take by mouth.   MISC NATURAL PRODUCTS PO Herbal Supplement   Multiple Vitamin (MULTIVITAMIN) tablet Take 1 tablet by mouth daily.   mupirocin ointment (BACTROBAN) 2 % Apply topically daily.   nitroGLYCERIN (NITROGLYN) 2 % ointment Apply 0.5 inches topically 3 (three) times daily as needed.    nystatin (MYCOSTATIN) 100000 UNIT/ML suspension Take 5 mLs (500,000 Units total) by mouth 3 (three) times daily. Swish and spit   omeprazole (PRILOSEC) 20 MG capsule Take 20 mg by mouth 2 (two) times daily.    Probiotic Product (PROBIOTIC PO) Take by mouth.   sertraline (ZOLOFT) 50 MG tablet Take 1 tablet (50 mg total) by mouth daily.   Tavaborole (KERYDIN) 5 % SOLN Apply to affected toenail every night until improved.   No facility-administered encounter medications on file as of 06/22/2021.     Lab Results  Component Value Date   WBC 6.9 06/22/2021   HGB 13.0 06/22/2021   HCT 40.3 06/22/2021   PLT 243.0 06/22/2021   GLUCOSE 87 06/22/2021   CHOL 189 03/30/2020   TRIG 112.0 03/30/2020   HDL 57.30 03/30/2020   LDLCALC 109 (H) 03/30/2020   ALT 11 05/25/2021   AST 13 05/25/2021   NA 139 06/22/2021   K 4.3 06/22/2021   CL 103 06/22/2021   CREATININE 0.78 06/22/2021   BUN 11 06/22/2021   CO2 29 06/22/2021   TSH 1.14 05/25/2021       Assessment & Plan:   Problem List Items Addressed This Visit     Anxiety - Primary    Overall appears to be doing better.  Decrease zoloft as outlined.  Has xanax if needed.  Discussed using prn.  Follow.  Keep f/u with psych.       GERD (gastroesophageal reflux disease)    Upper symptoms controlled on prilosec.        Headache    Discussed.  She was questioning if related to steroid cream or increased dose of zoloft.  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  Felt good singing.  No significant sinus symptoms.  No vision change.  Will decrease zoloft to 85m and adjust timing.   Check cbc and esr.  Blood pressure better. Follow closely.  Will require further testing if persistent.  Call with update.       Relevant Orders   CBC with Differential/Platelet (Completed)   Sedimentation rate (Completed)   Basic metabolic panel (Completed)   [place]cleroderma ([place]    Followed by Dr KJefm Bryant  On low dose amlodipine.  Previous infection - finger.  Had been on doxycycline.  Off now.  fingers doing better.          CEinar Pheasant MD

## 2021-06-22 NOTE — Patient Instructions (Signed)
Decrease zoloft to 25mg  before bed

## 2021-06-24 ENCOUNTER — Encounter: Payer: Self-pay | Admitting: Internal Medicine

## 2021-06-24 NOTE — Telephone Encounter (Signed)
Please notify her that I am ok if she takes it earlier in the day.  Keep [place] posted.  Let me know if needs anything.

## 2021-06-25 ENCOUNTER — Telehealth: Payer: Self-pay | Admitting: Internal Medicine

## 2021-06-25 NOTE — Telephone Encounter (Signed)
Patient

## 2021-06-25 NOTE — Telephone Encounter (Signed)
Patient called in has cut back on anxiety medication Zoloft, yesterday was 1st day doing 25 mg but was taking 50 mg because of headache . Patient would like to go back to taking 50 mg because the results from taking only 25 mg anxiety was not good, heart rate up , laying down  heart rate was in the 80 and patient experience hot flashes and  sweating, patient is stating will go back today taking 50 mg if Dr Nicki Reaper is okay. Headaches will be better than the anxiety patient stated

## 2021-06-26 ENCOUNTER — Encounter: Payer: Self-pay | Admitting: Internal Medicine

## 2021-06-27 ENCOUNTER — Encounter: Payer: Self-pay | Admitting: Internal Medicine

## 2021-06-27 NOTE — Assessment & Plan Note (Signed)
Upper symptoms controlled on prilosec.  

## 2021-06-27 NOTE — Assessment & Plan Note (Signed)
Overall appears to be doing better.  Decrease zoloft as outlined.  Has xanax if needed.  Discussed using prn.  Follow.  Keep f/u with psych.

## 2021-06-27 NOTE — Assessment & Plan Note (Signed)
Discussed.  She was questioning if related to steroid cream or increased dose of zoloft.  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  Felt good singing.  No significant sinus symptoms.  No vision change.  Will decrease zoloft to 57m and adjust timing.   Check cbc and esr.  Blood pressure better. Follow closely.  Will require further testing if persistent.  Call with update.

## 2021-06-27 NOTE — Assessment & Plan Note (Signed)
Followed by Dr Jefm Bryant.  On low dose amlodipine.  Previous infection - finger.  Had been on doxycycline.  Off now.  fingers doing better.

## 2021-06-28 NOTE — Telephone Encounter (Signed)
Mychart message routed to provider.

## 2021-06-28 NOTE — Telephone Encounter (Signed)
Please call and confirm she is doing ok.  I am ok with her remaining on the 50mg  dose.  Let me know if any problems.  Keep appt with psychiatry.

## 2021-06-29 ENCOUNTER — Encounter (HOSPITAL_COMMUNITY): Payer: Self-pay | Admitting: Psychiatry

## 2021-06-29 ENCOUNTER — Other Ambulatory Visit: Payer: Self-pay

## 2021-06-29 ENCOUNTER — Ambulatory Visit (HOSPITAL_BASED_OUTPATIENT_CLINIC_OR_DEPARTMENT_OTHER): Payer: BC Managed Care – PPO | Admitting: Psychiatry

## 2021-06-29 VITALS — BP 128/76 | HR 84 | Temp 98.3°F | Resp 20 | Ht 64.0 in | Wt 156.8 lb

## 2021-06-29 DIAGNOSIS — F4323 Adjustment disorder with mixed anxiety and depressed mood: Secondary | ICD-10-CM | POA: Diagnosis not present

## 2021-06-29 MED ORDER — LORAZEPAM 0.5 MG PO TABS: ORAL_TABLET | ORAL | 2 refills | Status: DC

## 2021-06-29 NOTE — Telephone Encounter (Signed)
Patient saw psych today. Meds were changed. Has f/u appt Monday with PCP. Tapering off zoloft per psych. Confirmed doing ok

## 2021-06-29 NOTE — Telephone Encounter (Signed)
Pt returning call. Pt requesting callback.  °

## 2021-06-29 NOTE — Telephone Encounter (Signed)
LMTCB

## 2021-06-30 NOTE — Progress Notes (Signed)
Psychiatric Initial Adult Assessment   Patient Identification: Denise Arnold MRN:  [id number] Date of Evaluation:  06/30/2021 Referral Source: Dr. Lavone Nian  Chief Complaint:   Visit Diagnosis adjustment disorder with an anxious mood  History of Present Illness:    This patient is a 60 year old white married female who presents with anxiety symptomatology.  The patient had no significant psychiatric symptomatology until October 23 when she was at the beach.  At that time she experienced a COVID infection.  She eventually was started on Paxavid for treatment.  Shortly thereafter she developed intense diarrhea.  She believes she got sick from the treatment.  She eventually left the beach and on her way back home she stopped in the emergency room in [place].  The patient had a thorough evaluation and was determined that she was having anxiety more than anything.  Was characterized by night sweats and feeling very uncomfortable.  Eventually she saw her primary care doctor and was begun on Zoloft 50 mg which then went to 25 mg.  She is now back to 50 mg. The patient been married for over 30 years.  She is a very stable marriage.  She has 1 child a son who is married and has 2 children.  The patient's husband is very stable as is the rest of her family.  The patient denies daily depression.  She is sleeping well when she takes Tylenol PM.  Her appetite is good she has normal energy and normal ability to think and concentrate.  She denies feeling worthless or suicidal.  The patient says she is angry at all this has happened to her.  Patient denies use of alcohol or drugs.  She has never had psychotic symptoms.  She denies symptoms of major depression or mania.  She denies the specific anxiety symptomatology of generalized anxiety disorder, obsessive-compulsive disorder or panic disorder.  The patient has no psychiatric history.  She has never seen a psychiatrist or been in a psychiatric hospital.  Her  medical history is are significant for scleroderma and Raynaud's.  It is noted the patient says that she feels anxious a lot when she does things that she enjoys like singing in the choir or doing activities her anxiety goes away.  In essence she easily distract herself from her anxiety.  The patient is from [place].  She is a mother who is 63 years old and is doing fairly well.  The patient's major complaint is that of anxiety which can be very impairing to her.  Associated Signs/Symptoms: Depression Symptoms:   (Hypo) Manic Symptoms:     Anxiety Symptoms:   Psychotic Symptoms:     PTSD Symptoms: NA  Past Psychiatric History: None  Previous Psychotropic Medications: No   Substance Abuse History in the last 12 months:  No.  Consequences of Substance Abuse:    Past Medical History:  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HCC)    Scleroderma (HCC)    positive FANA, positive SCL-70 abs, raynauds, mild pulmonary hypertension, normal DLCO   Tricuspid regurgitation    Vitamin D deficiency     Past Surgical History:  Procedure Laterality Date   CESAREAN SECTION  1994   COLONOSCOPY WITH PROPOFOL N/A 05/29/2015   Procedure: COLONOSCOPY WITH PROPOFOL;  Surgeon: Manya Silvas, MD;  Location: [facility];  Service: Endoscopy;  Laterality: N/A;   DILATION AND CURETTAGE OF UTERUS  1992   DILATION AND CURETTAGE OF UTERUS  1993   DILATION AND CURETTAGE OF UTERUS  2011   VEIN SURGERY      Family Psychiatric History:   Family History:  Family History  Problem Relation Age of Onset   Heart disease Father        myocardial infarction   Arthritis/Rheumatoid Father    Hypertension Father    Hyperlipidemia Mother    Breast cancer Maternal Grandmother    Colon cancer Neg Hx     Social History:   Social History   Socioeconomic History    Marital status: Married    Spouse name: Not on file   Number of children: 1   Years of education: Not on file   Highest education level: Not on file  Occupational History   Not on file  Tobacco Use   Smoking status: Never   Smokeless tobacco: Never  Substance and Sexual Activity   Alcohol use: No    Alcohol/week: 0.0 standard drinks   Drug use: No   Sexual activity: Not on file  Other Topics Concern   Not on file  Social History Narrative   Not on file   Social Determinants of Health   Financial Resource Strain: Not on file  Food Insecurity: Not on file  Transportation Needs: Not on file  Physical Activity: Not on file  Stress: Not on file  Social Connections: Not on file    Additional Social History:   Allergies:   Allergies  Allergen Reactions   Sulfa Antibiotics Nausea And Vomiting   Penicillins Rash    Metabolic Disorder Labs: No results found for: HGBA1C, MPG No results found for: PROLACTIN Lab Results  Component Value Date   CHOL 189 03/30/2020   TRIG 112.0 03/30/2020   HDL 57.30 03/30/2020   CHOLHDL 3 03/30/2020   VLDL 22.4 03/30/2020   LDLCALC 109 (H) 03/30/2020   LDLCALC 103 (H) 09/20/2019   Lab Results  Component Value Date   TSH 1.14 05/25/2021    Therapeutic Level Labs: No results found for: LITHIUM No results found for: CBMZ No results found for: VALPROATE  Current Medications: Current Outpatient Medications  Medication Sig Dispense Refill   LORazepam (ATIVAN) 0.5 MG tablet 1  qam   1  qhs  1  prn 75 tablet 2   ALPRAZolam (XANAX) 0.25 MG tablet TAKE 1 TABLET BY MOUTH 2 TIMES DAILY AS NEEDED FOR ANXIETY. 30 tablet 0   amLODipine (NORVASC) 10 MG tablet Take 5 mg by mouth daily.   1   azelastine (ASTELIN) 0.1 % nasal spray PLACE 1 SPRAY INTO BOTH NOSTRILS 2 (TWO) TIMES DAILY. USE IN EACH NOSTRIL AS DIRECTED 30 mL 1   Biotin 5000 MCG TABS      Calcium-Magnesium-Vitamin D (CALCIUM 500 PO) Take by mouth.     Cholecalciferol (VITAMIN D) 50  MCG (2000 UT) tablet Take 2,000 Units by mouth daily.     clobetasol cream (TEMOVATE) 0.05 % Apply to elbows twice a day as needed. Avoid face, groin, axilla. 30 g 0   Cranberry 400 MG CAPS Take by mouth.     fish oil-omega-3 fatty acids 1000 MG capsule Take 1 g by mouth daily.     glucosamine-chondroitin 500-400 MG tablet Take 1 tablet by mouth daily.     hydrOXYzine (ATARAX/VISTARIL) 25 MG tablet Take by mouth.     MISC NATURAL PRODUCTS PO  Herbal Supplement     Multiple Vitamin (MULTIVITAMIN) tablet Take 1 tablet by mouth daily.     mupirocin ointment (BACTROBAN) 2 % Apply topically daily.     nitroGLYCERIN (NITROGLYN) 2 % ointment Apply 0.5 inches topically 3 (three) times daily as needed.      nystatin (MYCOSTATIN) 100000 UNIT/ML suspension Take 5 mLs (500,000 Units total) by mouth 3 (three) times daily. Swish and spit 60 mL 0   omeprazole (PRILOSEC) 20 MG capsule Take 20 mg by mouth 2 (two) times daily.      Probiotic Product (PROBIOTIC PO) Take by mouth.     sertraline (ZOLOFT) 50 MG tablet Take 1 tablet (50 mg total) by mouth daily. 30 tablet 2   Tavaborole (KERYDIN) 5 % SOLN Apply to affected toenail every night until improved. 10 mL 2   No current facility-administered medications for this visit.    Musculoskeletal: Strength & Muscle Tone: within normal limits Gait & Station: normal Patient leans: N/A  Psychiatric Specialty Exam: Review of Systems  Blood pressure 128/76, pulse 84, temperature 98.3 F (36.8 C), temperature source Oral, resp. rate 20, height 5\' 4"  (1.626 m), weight 156 lb 12.8 oz (71.1 kg), last menstrual period 02/14/2010, SpO2 99 %.Body mass index is 26.91 kg/m.  General Appearance: Casual  Eye Contact:  Good  Speech:  NA  Volume:  Normal  Mood:  Anxious  Affect:  Appropriate  Thought Process:  Coherent  Orientation:  Full (Time, Place, and Person)  Thought Content:  NA  Suicidal Thoughts:  No  Homicidal Thoughts:  No  Memory:  NA  Judgement:  Good   Insight:  Good  Psychomotor Activity:  Normal  Concentration:  Concentration: Good  Recall:  Good  Fund of Knowledge:Good  Language: Good  Akathisia:  No  Handed:  Right  AIMS (if indicated):  not done  Assets:  Desire for Improvement  ADL's:  Intact  Cognition: WNL  Sleep:  Good   Screenings: PHQ2-9    [facility] Office Visit from 06/22/2021 in [facility] Office Visit from 05/25/2021 in [facility] Office Visit from 10/02/2020 in [facility] Office Visit from 04/03/2020 in [facility] Office Visit from 01/03/2019 in [facility]  PHQ-2 Total Score 1 0 0 0 0  PHQ-9 Total Score -- -- -- -- 0       Assessment and Plan:   At this time it is apparent this patient has an adjustment disorder with an anxious mood state.  I believe she had multiple evaluations and I think she had side effects from medications.  I think she became over anxious and catastrophic about what was happening to her.  She does not have any specific anxiety disorder other than perhaps an adjustment disorder with an anxious mood state.  The patient was taking Xanax erratically.  At this time we will discontinue her Xanax and begin her on Ativan 0.5 mg twice daily with 1 extra if she needs it.  The patient will also after 1 week reduce her Zoloft down to 25 mg for 1 week and then discontinue it for a week later.  She will then return to see me in approximately 3 to 4 weeks.  When I see her neck she will be on Ativan 0.5 mg twice daily with 1 extra if she needs it and we will consider reducing it and adjusting it as necessary.  She will be off the Zoloft which  I suspect might be causing more problems than it was helping.   Jerral Ralph, MD 12/14/20222:15 PM

## 2021-07-01 ENCOUNTER — Other Ambulatory Visit (HOSPITAL_COMMUNITY): Payer: Self-pay | Admitting: *Deleted

## 2021-07-01 ENCOUNTER — Other Ambulatory Visit: Payer: Self-pay | Admitting: Internal Medicine

## 2021-07-05 ENCOUNTER — Ambulatory Visit (INDEPENDENT_AMBULATORY_CARE_PROVIDER_SITE_OTHER): Payer: BC Managed Care – PPO | Admitting: Internal Medicine

## 2021-07-05 ENCOUNTER — Encounter: Payer: Self-pay | Admitting: Internal Medicine

## 2021-07-05 ENCOUNTER — Other Ambulatory Visit: Payer: Self-pay

## 2021-07-05 DIAGNOSIS — M349 Systemic sclerosis, unspecified: Secondary | ICD-10-CM | POA: Diagnosis not present

## 2021-07-05 DIAGNOSIS — F419 Anxiety disorder, unspecified: Secondary | ICD-10-CM

## 2021-07-05 DIAGNOSIS — R002 Palpitations: Secondary | ICD-10-CM | POA: Diagnosis not present

## 2021-07-05 DIAGNOSIS — K2100 Gastro-esophageal reflux disease with esophagitis, without bleeding: Secondary | ICD-10-CM | POA: Diagnosis not present

## 2021-07-05 NOTE — Progress Notes (Signed)
Patient ID: Denise Arnold, female   DOB: 09/19/1960, 60 y.o.   MRN: [id number]   Subjective:    Patient ID: Denise Arnold, female    DOB: February 26, 1961, 60 y.o.   MRN: [id number]  This visit occurred during the SARS-CoV-2 public health emergency.  Safety protocols were in place, including screening questions prior to the visit, additional usage of staff PPE, and extensive cleaning of exam room while observing appropriate contact time as indicated for disinfecting solutions.   Patient here for a scheduled follow up.   Chief Complaint  Patient presents with   Anxiety   .   HPI Had covid 10/22.  Increased anxiety since.  Was started on zoloft.  Had been taking xanax prn.  Saw psychiatry recently.  Is tapering off zoloft.  Will start 25mg  q day tomorrow for one week and then stop.  He placed her on lorazepam.  She is taking 1/2 .5mg  bid and then taking one whole before bed.  Yesterday - 1/2 tid worked well for her.  Sleeping better.  Feels better.  Singing.  Doing her exercise classes.  States that two weeks ago, she had a low grade fever.  Developed cough.  Grandchildren - flu.  Saw Dr Denise Arnold.  Was given tessalon perles.  She is better.  Did not have to take.  No fever.  No increased congestion.  Minimal cough.  No chest pain, chest congestion or sob.  No nausea or vomiting.  No abdominal pain.  No bowel problems reported.  Heart rate is better.  Overall feels better.     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HCC)    Scleroderma (HCC)    positive FANA, positive SCL-70 abs, raynauds, mild pulmonary hypertension, normal DLCO   Tricuspid regurgitation    Vitamin D deficiency    Past Surgical History:  Procedure Laterality Date   CESAREAN SECTION  1994   COLONOSCOPY WITH PROPOFOL N/A 05/29/2015   Procedure: COLONOSCOPY WITH PROPOFOL;  Surgeon: Manya Silvas,  MD;  Location: [facility];  Service: Endoscopy;  Laterality: N/A;   DILATION AND CURETTAGE OF UTERUS  1992   DILATION AND CURETTAGE OF UTERUS  1993   DILATION AND CURETTAGE OF UTERUS  2011   VEIN SURGERY     Family History  Problem Relation Age of Onset   Heart disease Father        myocardial infarction   Arthritis/Rheumatoid Father    Hypertension Father    Hyperlipidemia Mother    Breast cancer Maternal Grandmother    Colon cancer Neg Hx    Social History   Socioeconomic History   Marital status: Married    Spouse name: Not on file   Number of children: 1   Years of education: Not on file   Highest education level: Not on file  Occupational History   Not on file  Tobacco Use   Smoking status: Never   Smokeless tobacco: Never  Substance and Sexual Activity   Alcohol use: No    Alcohol/week: 0.0 standard drinks   Drug use: No   Sexual activity: Not on file  Other Topics Concern   Not on file  Social History Narrative   Not on file   Social Determinants of Health   Financial Resource Strain: Not on file  Food Insecurity: Not on  file  Transportation Needs: Not on file  Physical Activity: Not on file  Stress: Not on file  Social Connections: Not on file     Review of Systems  Constitutional:  Negative for appetite change and unexpected weight change.  HENT:  Negative for congestion and sinus pressure.   Respiratory:  Negative for chest tightness and shortness of breath.        Minimal cough  Cardiovascular:  Negative for chest pain, palpitations and leg swelling.  Gastrointestinal:  Negative for abdominal pain, diarrhea, nausea and vomiting.  Genitourinary:  Negative for difficulty urinating and dysuria.  Musculoskeletal:  Negative for joint swelling and myalgias.  Neurological:  Negative for dizziness, light-headedness and headaches.  Psychiatric/Behavioral:  Negative for agitation and dysphoric mood.       Objective:     BP 118/70    Pulse 72     Temp (!) 97.3 F (36.3 C)    Resp 16    Ht 5\' 4"  (1.626 m)    Wt 157 lb 6.4 oz (71.4 kg)    LMP 02/14/2010    SpO2 99%    BMI 27.02 kg/m  Wt Readings from Last 3 Encounters:  07/05/21 157 lb 6.4 oz (71.4 kg)  06/29/21 156 lb 12.8 oz (71.1 kg)  06/22/21 159 lb 4 oz (72.2 kg)    Physical Exam Vitals reviewed.  Constitutional:      General: She is not in acute distress.    Appearance: Normal appearance.  HENT:     Head: Normocephalic and atraumatic.     Right Ear: External ear normal.     Left Ear: External ear normal.  Eyes:     General: No scleral icterus.       Right eye: No discharge.        Left eye: No discharge.     Conjunctiva/sclera: Conjunctivae normal.  Neck:     Thyroid: No thyromegaly.  Cardiovascular:     Rate and Rhythm: Normal rate and regular rhythm.  Pulmonary:     Effort: No respiratory distress.     Breath sounds: Normal breath sounds. No wheezing.  Abdominal:     General: Bowel sounds are normal.     Palpations: Abdomen is soft.     Tenderness: There is no abdominal tenderness.  Musculoskeletal:        General: No swelling or tenderness.     Cervical back: Neck supple. No tenderness.  Lymphadenopathy:     Cervical: No cervical adenopathy.  Skin:    Findings: No erythema or rash.  Neurological:     Mental Status: She is alert.  Psychiatric:        Mood and Affect: Mood normal.        Behavior: Behavior normal.     Outpatient Encounter Medications as of 07/05/2021  Medication Sig   amLODipine (NORVASC) 10 MG tablet Take 5 mg by mouth daily.    azelastine (ASTELIN) 0.1 % nasal spray PLACE 1 SPRAY INTO BOTH NOSTRILS 2 (TWO) TIMES DAILY. USE IN EACH NOSTRIL AS DIRECTED   Biotin 5000 MCG TABS    Calcium-Magnesium-Vitamin D (CALCIUM 500 PO) Take by mouth.   Cholecalciferol (VITAMIN D) 50 MCG (2000 UT) tablet Take 2,000 Units by mouth daily.   clobetasol cream (TEMOVATE) 0.05 % Apply to elbows twice a day as needed. Avoid face, groin, axilla.    Cranberry 400 MG CAPS Take by mouth.   fish oil-omega-3 fatty acids 1000 MG capsule Take 1 g by mouth  daily.   glucosamine-chondroitin 500-400 MG tablet Take 1 tablet by mouth daily.   LORazepam (ATIVAN) 0.5 MG tablet 1  qam   1  qhs  1  prn   MISC NATURAL PRODUCTS PO Herbal Supplement   Multiple Vitamin (MULTIVITAMIN) tablet Take 1 tablet by mouth daily.   mupirocin ointment (BACTROBAN) 2 % Apply topically daily.   nitroGLYCERIN (NITROGLYN) 2 % ointment Apply 0.5 inches topically 3 (three) times daily as needed.    omeprazole (PRILOSEC) 20 MG capsule Take 20 mg by mouth 2 (two) times daily.    Probiotic Product (PROBIOTIC PO) Take by mouth.   sertraline (ZOLOFT) 50 MG tablet TAKE 1 TABLET BY MOUTH EVERY DAY   Tavaborole (KERYDIN) 5 % SOLN Apply to affected toenail every night until improved.   [DISCONTINUED] hydrOXYzine (ATARAX/VISTARIL) 25 MG tablet Take by mouth.   [DISCONTINUED] nystatin (MYCOSTATIN) 100000 UNIT/ML suspension Take 5 mLs (500,000 Units total) by mouth 3 (three) times daily. Swish and spit   No facility-administered encounter medications on file as of 07/05/2021.     Lab Results  Component Value Date   WBC 6.9 06/22/2021   HGB 13.0 06/22/2021   HCT 40.3 06/22/2021   PLT 243.0 06/22/2021   GLUCOSE 87 06/22/2021   CHOL 189 03/30/2020   TRIG 112.0 03/30/2020   HDL 57.30 03/30/2020   LDLCALC 109 (H) 03/30/2020   ALT 11 05/25/2021   AST 13 05/25/2021   NA 139 06/22/2021   K 4.3 06/22/2021   CL 103 06/22/2021   CREATININE 0.78 06/22/2021   BUN 11 06/22/2021   CO2 29 06/22/2021   TSH 1.14 05/25/2021       Assessment & Plan:   Problem List Items Addressed This Visit     Anxiety    Doing much better now.  Tapering off zoloft.  On lorazepam.  Seeing psychiatry.  Feels better.  Follow.       GERD (gastroesophageal reflux disease)    No acid reflux reported.  Prilosec.       Palpitations    Recent EKG - unrevealing.  Saw cardiology.  Wore monitor.  Heart  rate better.  Feels better.  Follow.       Scleroderma ([facility])    Followed by Dr Jefm Bryant.  On low dose amlodipine.  Previous infection - finger.  Had been on doxycycline.  Off now.  fingers doing better.  Has f/u planned with scleroderma specialist tomorrow.          Einar Pheasant, MD

## 2021-07-05 NOTE — Assessment & Plan Note (Signed)
Followed by Dr Jefm Bryant.  On low dose amlodipine.  Previous infection - finger.  Had been on doxycycline.  Off now.  fingers doing better.  Has f/u planned with scleroderma specialist tomorrow.

## 2021-07-05 NOTE — Assessment & Plan Note (Signed)
Doing much better now.  Tapering off zoloft.  On lorazepam.  Seeing psychiatry.  Feels better.  Follow.

## 2021-07-05 NOTE — Assessment & Plan Note (Signed)
No acid reflux reported.  Prilosec.  

## 2021-07-05 NOTE — Assessment & Plan Note (Signed)
Recent EKG - unrevealing.  Saw cardiology.  Wore monitor.  Heart rate better.  Feels better.  Follow.

## 2021-07-13 ENCOUNTER — Other Ambulatory Visit: Payer: Self-pay

## 2021-07-13 ENCOUNTER — Ambulatory Visit (INDEPENDENT_AMBULATORY_CARE_PROVIDER_SITE_OTHER): Payer: BC Managed Care – PPO | Admitting: Internal Medicine

## 2021-07-13 DIAGNOSIS — I7300 Raynaud's syndrome without gangrene: Secondary | ICD-10-CM | POA: Diagnosis not present

## 2021-07-13 DIAGNOSIS — M349 Systemic sclerosis, unspecified: Secondary | ICD-10-CM

## 2021-07-13 DIAGNOSIS — F419 Anxiety disorder, unspecified: Secondary | ICD-10-CM

## 2021-07-13 DIAGNOSIS — Z86010 Personal history of colonic polyps: Secondary | ICD-10-CM

## 2021-07-13 DIAGNOSIS — K2100 Gastro-esophageal reflux disease with esophagitis, without bleeding: Secondary | ICD-10-CM | POA: Diagnosis not present

## 2021-07-13 DIAGNOSIS — R002 Palpitations: Secondary | ICD-10-CM

## 2021-07-13 NOTE — Progress Notes (Signed)
Patient ID: Denise Arnold, female   DOB: 01-07-1961, 60 y.o.   MRN: [id number]   Subjective:    Patient ID: Denise Arnold, female    DOB: March 24, 1961, 60 y.o.   MRN: [id number]  This visit occurred during the SARS-CoV-2 public health emergency.  Safety protocols were in place, including screening questions prior to the visit, additional usage of staff PPE, and extensive cleaning of exam room while observing appropriate contact time as indicated for disinfecting solutions.   Patient here for a scheduled follow up.  Chief Complaint  Patient presents with   Anxiety   .   HPI Since covid - increased anxiety/stress.  Discused.  Seeing psychiatry now.  Tapering off zoloft.  Taking lorazepam 1/2 tablet.  Doing better.  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  Sleeping better.  Planning for PFTs tomorrow.  Symptoms improved.  No chest pain or sob reported.  No abdominal pain or bowel change reported.  Minimal cough.  No significant cough or congestion.     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HCC)    Scleroderma (HCC)    positive FANA, positive SCL-70 abs, raynauds, mild pulmonary hypertension, normal DLCO   Tricuspid regurgitation    Vitamin D deficiency    Past Surgical History:  Procedure Laterality Date   CESAREAN SECTION  1994   COLONOSCOPY WITH PROPOFOL N/A 05/29/2015   Procedure: COLONOSCOPY WITH PROPOFOL;  Surgeon: Manya Silvas, MD;  Location: [facility];  Service: Endoscopy;  Laterality: N/A;   DILATION AND CURETTAGE OF UTERUS  1992   DILATION AND CURETTAGE OF UTERUS  1993   DILATION AND CURETTAGE OF UTERUS  2011   VEIN SURGERY     Family History  Problem Relation Age of Onset   Heart disease Father        myocardial infarction   Arthritis/Rheumatoid Father    Hypertension Father    Hyperlipidemia Mother    Breast cancer Maternal Grandmother     Colon cancer Neg Hx    Social History   Socioeconomic History   Marital status: Married    Spouse name: Not on file   Number of children: 1   Years of education: Not on file   Highest education level: Not on file  Occupational History   Not on file  Tobacco Use   Smoking status: Never   Smokeless tobacco: Never  Substance and Sexual Activity   Alcohol use: No    Alcohol/week: 0.0 standard drinks   Drug use: No   Sexual activity: Not on file  Other Topics Concern   Not on file  Social History Narrative   Not on file   Social Determinants of Health   Financial Resource Strain: Not on file  Food Insecurity: Not on file  Transportation Needs: Not on file  Physical Activity: Not on file  Stress: Not on file  Social Connections: Not on file     Review of Systems  Constitutional:  Negative for appetite change and unexpected weight change.  HENT:  Negative for congestion and sinus pressure.   Respiratory:  Negative for chest tightness and shortness of breath.        No significant cough - minimal cough.    Cardiovascular:  Negative for chest pain, palpitations and leg swelling.  Gastrointestinal:  Negative for abdominal pain, diarrhea, nausea and  vomiting.  Genitourinary:  Negative for difficulty urinating and dysuria.  Musculoskeletal:  Negative for joint swelling and myalgias.  Skin:  Negative for color change and rash.  Neurological:  Negative for dizziness, light-headedness and headaches.  Psychiatric/Behavioral:  Negative for agitation and dysphoric mood.       Objective:     BP 116/78    Pulse 78    Temp 98 F (36.7 C)    Resp 16    Ht 5\' 4"  (1.626 m)    Wt 157 lb (71.2 kg)    LMP 02/14/2010    SpO2 97%    BMI 26.95 kg/m  Wt Readings from Last 3 Encounters:  07/13/21 157 lb (71.2 kg)  07/05/21 157 lb 6.4 oz (71.4 kg)  06/22/21 159 lb 4 oz (72.2 kg)    Physical Exam Vitals reviewed.  Constitutional:      General: She is not in acute distress.     Appearance: Normal appearance.  HENT:     Head: Normocephalic and atraumatic.     Right Ear: External ear normal.     Left Ear: External ear normal.  Eyes:     General: No scleral icterus.       Right eye: No discharge.        Left eye: No discharge.     Conjunctiva/sclera: Conjunctivae normal.  Neck:     Thyroid: No thyromegaly.  Cardiovascular:     Rate and Rhythm: Normal rate and regular rhythm.  Pulmonary:     Effort: No respiratory distress.     Breath sounds: Normal breath sounds. No wheezing.  Abdominal:     General: Bowel sounds are normal.     Palpations: Abdomen is soft.     Tenderness: There is no abdominal tenderness.  Musculoskeletal:        General: No swelling or tenderness.     Cervical back: Neck supple. No tenderness.  Lymphadenopathy:     Cervical: No cervical adenopathy.  Skin:    Findings: No erythema or rash.  Neurological:     Mental Status: She is alert.  Psychiatric:        Mood and Affect: Mood normal.        Behavior: Behavior normal.     Outpatient Encounter Medications as of 07/13/2021  Medication Sig   amLODipine (NORVASC) 10 MG tablet Take 5 mg by mouth daily.    azelastine (ASTELIN) 0.1 % nasal spray PLACE 1 SPRAY INTO BOTH NOSTRILS 2 (TWO) TIMES DAILY. USE IN EACH NOSTRIL AS DIRECTED   Biotin 5000 MCG TABS    Calcium-Magnesium-Vitamin D (CALCIUM 500 PO) Take by mouth.   Cholecalciferol (VITAMIN D) 50 MCG (2000 UT) tablet Take 2,000 Units by mouth daily.   clobetasol cream (TEMOVATE) 0.05 % Apply to elbows twice a day as needed. Avoid face, groin, axilla.   Cranberry 400 MG CAPS Take by mouth.   fish oil-omega-3 fatty acids 1000 MG capsule Take 1 g by mouth daily.   glucosamine-chondroitin 500-400 MG tablet Take 1 tablet by mouth daily.   LORazepam (ATIVAN) 0.5 MG tablet 1  qam   1  qhs  1  prn   MISC NATURAL PRODUCTS PO Herbal Supplement   Multiple Vitamin (MULTIVITAMIN) tablet Take 1 tablet by mouth daily.   mupirocin ointment  (BACTROBAN) 2 % Apply topically daily.   nitroGLYCERIN (NITROGLYN) 2 % ointment Apply 0.5 inches topically 3 (three) times daily as needed.    omeprazole (PRILOSEC) 20 MG capsule Take 20  mg by mouth 2 (two) times daily.    Probiotic Product (PROBIOTIC PO) Take by mouth.   Tavaborole (KERYDIN) 5 % SOLN Apply to affected toenail every night until improved.   [DISCONTINUED] sertraline (ZOLOFT) 50 MG tablet TAKE 1 TABLET BY MOUTH EVERY DAY   No facility-administered encounter medications on file as of 07/13/2021.     Lab Results  Component Value Date   WBC 6.9 06/22/2021   HGB 13.0 06/22/2021   HCT 40.3 06/22/2021   PLT 243.0 06/22/2021   GLUCOSE 87 06/22/2021   CHOL 189 03/30/2020   TRIG 112.0 03/30/2020   HDL 57.30 03/30/2020   LDLCALC 109 (H) 03/30/2020   ALT 11 05/25/2021   AST 13 05/25/2021   NA 139 06/22/2021   K 4.3 06/22/2021   CL 103 06/22/2021   CREATININE 0.78 06/22/2021   BUN 11 06/22/2021   CO2 29 06/22/2021   TSH 1.14 05/25/2021       Assessment & Plan:   Problem List Items Addressed This Visit     Anxiety    Doing much better now.  Tapering off zoloft.  On lorazepam.  Seeing psychiatry.  Feels better.  Follow.       GERD (gastroesophageal reflux disease)    No acid reflux reported.  Prilosec.       History of colonic polyps    Colonoscopy - 03/2021      Palpitations    Doing better.  Saw cardiology.  Follow.        Raynauds phenomenon    On low dose amlodipine.       Scleroderma ([place])    Followed by Dr Jefm Bryant.  On low dose amlodipine.  Previous infection - finger.  Had been on doxycycline.  Off now.  fingers doing better.  Saw scleroderma specialist. Stable.          Einar Pheasant, MD

## 2021-07-19 ENCOUNTER — Encounter: Payer: Self-pay | Admitting: Internal Medicine

## 2021-07-19 NOTE — Assessment & Plan Note (Signed)
Colonoscopy 03/2021.  

## 2021-07-19 NOTE — Assessment & Plan Note (Signed)
Doing much better now.  Tapering off zoloft.  On lorazepam.  Seeing psychiatry.  Feels better.  Follow.

## 2021-07-19 NOTE — Assessment & Plan Note (Signed)
Doing better.  Saw cardiology.  Follow.

## 2021-07-19 NOTE — Assessment & Plan Note (Signed)
On low dose amlodipine.

## 2021-07-19 NOTE — Assessment & Plan Note (Signed)
Followed by Dr Jefm Bryant.  On low dose amlodipine.  Previous infection - finger.  Had been on doxycycline.  Off now.  fingers doing better.  Saw scleroderma specialist. Stable.

## 2021-07-19 NOTE — Assessment & Plan Note (Signed)
No acid reflux reported.  Prilosec.  

## 2021-07-29 ENCOUNTER — Telehealth: Payer: BC Managed Care – PPO | Admitting: Physician Assistant

## 2021-07-29 DIAGNOSIS — J020 Streptococcal pharyngitis: Secondary | ICD-10-CM

## 2021-07-29 MED ORDER — AZITHROMYCIN 250 MG PO TABS: ORAL_TABLET | ORAL | 0 refills | Status: AC

## 2021-07-29 NOTE — Progress Notes (Signed)
E-Visit for Sore Throat - Strep Symptoms  We are sorry that you are not feeling well.  Here is how we plan to help!  Based on what you have shared with me it is likely that you have strep pharyngitis.  Strep pharyngitis is inflammation and infection in the back of the throat.  This is an infection cause by bacteria and is treated with antibiotics.  I have prescribed Azithromycin 250 mg two tablets today and then one daily for 4 additional days. For throat pain, we recommend over the counter oral pain relief medications such as acetaminophen or aspirin, or anti-inflammatory medications such as ibuprofen or naproxen sodium. Topical treatments such as oral throat lozenges or sprays may be used as needed. Strep infections are not as easily transmitted as other respiratory infections, however we still recommend that you avoid close contact with loved ones, especially the very young and elderly.  Remember to wash your hands thoroughly throughout the day as this is the number one way to prevent the spread of infection and wipe down door knobs and counters with disinfectant.   Home Care: Only take medications as instructed by your medical team. Complete the entire course of an antibiotic. Do not take these medications with alcohol. A steam or ultrasonic humidifier can help congestion.  You can place a towel over your head and breathe in the steam from hot water coming from a faucet. Avoid close contacts especially the very young and the elderly. Cover your mouth when you cough or sneeze. Always remember to wash your hands.  Get Help Right Away If: You develop worsening fever or sinus pain. You develop a severe head ache or visual changes. Your symptoms persist after you have completed your treatment plan.  Make sure you Understand these instructions. Will watch your condition. Will get help right away if you are not doing well or get worse.   Thank you for choosing an e-visit.  Your e-visit  answers were reviewed by a board certified advanced clinical practitioner to complete your personal care plan. Depending upon the condition, your plan could have included both over the counter or prescription medications.  Please review your pharmacy choice. Make sure the pharmacy is open so you can pick up prescription now. If there is a problem, you may contact your provider through MyChart messaging and have the prescription routed to another pharmacy.  Your safety is important to us. If you have drug allergies check your prescription carefully.   For the next 24 hours you can use MyChart to ask questions about today's visit, request a non-urgent call back, or ask for a work or school excuse. You will get an email in the next two days asking about your experience. I hope that your e-visit has been valuable and will speed your recovery.  I provided 5 minutes of non face-to-face time during this encounter for chart review and documentation.   

## 2021-08-01 ENCOUNTER — Encounter: Payer: Self-pay | Admitting: Emergency Medicine

## 2021-08-01 ENCOUNTER — Ambulatory Visit
Admission: EM | Admit: 2021-08-01 | Discharge: 2021-08-01 | Disposition: A | Discharge status: Home / Self Care | Payer: BC Managed Care – PPO | Attending: Emergency Medicine | Admitting: Emergency Medicine

## 2021-08-01 DIAGNOSIS — J0100 Acute maxillary sinusitis, unspecified: Secondary | ICD-10-CM | POA: Diagnosis not present

## 2021-08-01 DIAGNOSIS — J029 Acute pharyngitis, unspecified: Secondary | ICD-10-CM

## 2021-08-01 DIAGNOSIS — J358 Other chronic diseases of tonsils and adenoids: Secondary | ICD-10-CM | POA: Diagnosis not present

## 2021-08-01 LAB — POCT RAPID STREP A (OFFICE): Rapid Strep A Screen: NEGATIVE

## 2021-08-01 MED ORDER — PREDNISONE 10 MG (21) PO TBPK: ORAL_TABLET | Freq: Every day | ORAL | 0 refills | Status: DC

## 2021-08-01 MED ORDER — DOXYCYCLINE HYCLATE 100 MG PO CAPS: 100.0000 mg | ORAL_CAPSULE | Freq: Two times a day (BID) | ORAL | 0 refills | Status: AC

## 2021-08-01 NOTE — ED Provider Notes (Signed)
[facility]    CSN: [id number] Arrival date & time: 08/01/21  1012      History   Chief Complaint Chief Complaint  Patient presents with   Nasal Congestion   Headache    HPI Denise Arnold is a 61 y.o. female.  Patient presents with 5-day history of sinus congestion, sinus pressure, sore throat, white-yellow exudate in throat.  No fever, rash, cough, shortness of breath, or other symptoms.  Patient had an e-visit on 07/29/2021; diagnosed with strep pharyngitis; treated with a Zithromax which she is currently taking.    The history is provided by the patient and medical records.   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HCC)    Scleroderma ([place])    positive FANA, positive SCL-70 abs, raynauds, mild pulmonary hypertension, normal DLCO   Tricuspid regurgitation    Vitamin D deficiency     Patient Active Problem List   Diagnosis Date Noted   Headache 06/22/2021   Anxiety 06/06/2021   Palpitations 05/28/2021   Diarrhea 05/25/2021   Fatigue 05/25/2021   Vaginal pain 03/03/2019   Axillary fullness 01/13/2019   UTI (urinary tract infection) 10/27/2018   Cellulitis 01/16/2018   Health care maintenance 01/25/2015   History of colonic polyps 04/22/2013   Vitamin D deficiency 01/15/2013   Degenerative cervical disc 01/15/2013   GERD (gastroesophageal reflux disease) 01/15/2013   Scleroderma ([place]) 07/17/2012   Raynauds phenomenon 07/17/2012    Past Surgical History:  Procedure Laterality Date   CESAREAN SECTION  1994   COLONOSCOPY WITH PROPOFOL N/A 05/29/2015   Procedure: COLONOSCOPY WITH PROPOFOL;  Surgeon: Manya Silvas, MD;  Location: [facility];  Service: Endoscopy;  Laterality: N/A;   DILATION AND CURETTAGE OF UTERUS  1992   DILATION AND CURETTAGE OF UTERUS  1993   DILATION AND CURETTAGE OF UTERUS  2011   VEIN SURGERY       OB History     Gravida  4   Para  1   Term      Preterm      AB  3   Living         SAB  3   IAB      Ectopic      Multiple      Live Births           Obstetric Comments  1st Menstrual Cycle:  13  1st Pregnancy:  28           Home Medications    Prior to Admission medications   Medication Sig Start Date End Date Taking? Authorizing Provider  doxycycline (VIBRAMYCIN) 100 MG capsule Take 1 capsule (100 mg total) by mouth 2 (two) times daily for 10 days. 08/01/21 08/11/21 Yes [name], NP  predniSONE (STERAPRED UNI-PAK 21 TAB) 10 MG (21) TBPK tablet Take by mouth daily. As directed 08/01/21  Yes [name], NP  amLODipine (NORVASC) 10 MG tablet Take 5 mg by mouth daily.  11/10/17   [provider]  azelastine (ASTELIN) 0.1 % nasal spray PLACE 1 SPRAY INTO BOTH NOSTRILS 2 (TWO) TIMES DAILY. USE IN EACH NOSTRIL AS DIRECTED 10/16/19   Einar Pheasant, MD  azithromycin (ZITHROMAX) 250 MG tablet Take 2 tablets on day 1, then 1 tablet daily on days 2 through 5 07/29/21 08/03/21  [place],  Clearnce Sorrel, PA-C  Biotin 5000 MCG TABS  09/21/18   [provider]  Calcium-Magnesium-Vitamin D (CALCIUM 500 PO) Take by mouth.    [provider]  Cholecalciferol (VITAMIN D) 50 MCG (2000 UT) tablet Take 2,000 Units by mouth daily.    [provider]  clobetasol cream (TEMOVATE) 0.05 % Apply to elbows twice a day as needed. Avoid face, groin, axilla. 06/16/21   Brendolyn Patty, MD  Cranberry 400 MG CAPS Take by mouth.    [provider]  fish oil-omega-3 fatty acids 1000 MG capsule Take 1 g by mouth daily.    [provider]  glucosamine-chondroitin 500-400 MG tablet Take 1 tablet by mouth daily.    [provider]  LORazepam (ATIVAN) 0.5 MG tablet 1  qam   1  qhs  1  prn 06/29/21   Norma Fredrickson, MD  MISC NATURAL PRODUCTS PO Herbal Supplement    [provider]  Multiple Vitamin (MULTIVITAMIN) tablet Take 1  tablet by mouth daily.    [provider]  mupirocin ointment (BACTROBAN) 2 % Apply topically daily. 05/05/21   [provider]  nitroGLYCERIN (NITROGLYN) 2 % ointment Apply 0.5 inches topically 3 (three) times daily as needed.     [provider]  omeprazole (PRILOSEC) 20 MG capsule Take 20 mg by mouth 2 (two) times daily.     [provider]  Probiotic Product (PROBIOTIC PO) Take by mouth.    [provider]  Tavaborole (KERYDIN) 5 % SOLN Apply to affected toenail every night until improved. 06/16/21   Brendolyn Patty, MD    Family History Family History  Problem Relation Age of Onset   Heart disease Father        myocardial infarction   Arthritis/Rheumatoid Father    Hypertension Father    Hyperlipidemia Mother    Breast cancer Maternal Grandmother    Colon cancer Neg Hx     Social History Social History   Tobacco Use   Smoking status: Never   Smokeless tobacco: Never  Substance Use Topics   Alcohol use: No    Alcohol/week: 0.0 standard drinks   Drug use: No     Allergies   Sulfa antibiotics and Penicillins   Review of Systems Review of Systems  Constitutional:  Negative for chills and fever.  HENT:  Positive for congestion, sinus pressure and sore throat. Negative for ear pain, trouble swallowing and voice change.   Respiratory:  Negative for cough and shortness of breath.   Cardiovascular:  Negative for chest pain and palpitations.  Gastrointestinal:  Negative for diarrhea and vomiting.  Skin:  Negative for color change and rash.  All other systems reviewed and are negative.   Physical Exam Triage Vital Signs ED Triage Vitals [08/01/21 1147]  Enc Vitals Group     BP (!) 158/75     Pulse Rate 90     Resp 18     Temp 98.2 F (36.8 C)     Temp src      SpO2 97 %     Weight      Height      Head Circumference      Peak Flow      Pain Score      Pain Loc      Pain Edu?      Excl. in [place]?    No data  found.  Updated Vital Signs BP (!) 158/75 (BP Location: Left Arm)  Pulse 90    Temp 98.2 F (36.8 C)    Resp 18    LMP 02/14/2010    SpO2 97%   Visual Acuity Right Eye Distance:   Left Eye Distance:   Bilateral Distance:    Right Eye Near:   Left Eye Near:    Bilateral Near:     Physical Exam Vitals and nursing note reviewed.  Constitutional:      General: She is not in acute distress.    Appearance: She is well-developed. She is not ill-appearing.  HENT:     Right Ear: Tympanic membrane normal.     Left Ear: Tympanic membrane normal.     Nose: Congestion present.     Mouth/Throat:     Mouth: Mucous membranes are moist.     Pharynx: Posterior oropharyngeal erythema present.     Comments: Bilateral tonsil stones.  Cardiovascular:     Rate and Rhythm: Normal rate and regular rhythm.     Heart sounds: Normal heart sounds.  Pulmonary:     Effort: Pulmonary effort is normal. No respiratory distress.     Breath sounds: Normal breath sounds.  Musculoskeletal:     Cervical back: Neck supple.  Skin:    General: Skin is warm and dry.  Neurological:     Mental Status: She is alert.  Psychiatric:        Mood and Affect: Mood normal.        Behavior: Behavior normal.     UC Treatments / Results  Labs (all labs ordered are listed, but only abnormal results are displayed) Labs Reviewed  POCT RAPID STREP A (OFFICE) - Normal  COVID-19, FLU A+B NAA    EKG   Radiology No results found.  Procedures Procedures (including critical care time)  Medications Ordered in UC Medications - No data to display  Initial Impression / Assessment and Plan / UC Course  I have reviewed the triage vital signs and the nursing notes.  Pertinent labs & imaging results that were available during my care of the patient were reviewed by me and considered in my medical decision making (see chart for details).    Sore throat, tonsil stones, acute sinusitis.  Not improving on Zithromax; on  day 5 today.  Treating sinus infection with prednisone taper and doxycyline. Instructed patient to follow up with ENT for tonsil stones.  She agrees to plan of care.    Final Clinical Impressions(s) / UC Diagnoses   Final diagnoses:  Sore throat  Tonsil stone  Acute non-recurrent maxillary sinusitis     Discharge Instructions      Take the prednisone and doxycycline as directed.  Follow up with an ENT.       ED Prescriptions     Medication Sig Dispense Auth. Provider   predniSONE (STERAPRED UNI-PAK 21 TAB) 10 MG (21) TBPK tablet Take by mouth daily. As directed 21 tablet [name], NP   doxycycline (VIBRAMYCIN) 100 MG capsule Take 1 capsule (100 mg total) by mouth 2 (two) times daily for 10 days. 20 capsule [name], NP      PDMP not reviewed this encounter.   [name], NP 08/01/21 1242

## 2021-08-01 NOTE — ED Triage Notes (Addendum)
Pt here with nasal congestion and sinus pressure x 5 days. States she's on Zpack for possible strep that was determined during an evisit. Sore throat is improved. Had [place] in October.

## 2021-08-01 NOTE — Discharge Instructions (Addendum)
Take the prednisone and doxycycline as directed.  Follow up with an ENT.

## 2021-08-03 LAB — COVID-19, FLU A+B NAA
Influenza A, NAA: NOT DETECTED
Influenza B, NAA: NOT DETECTED
SARS-CoV-2, NAA: NOT DETECTED

## 2021-08-11 ENCOUNTER — Telehealth (HOSPITAL_BASED_OUTPATIENT_CLINIC_OR_DEPARTMENT_OTHER): Payer: BC Managed Care – PPO | Admitting: Psychiatry

## 2021-08-11 ENCOUNTER — Other Ambulatory Visit: Payer: Self-pay

## 2021-08-11 DIAGNOSIS — F4323 Adjustment disorder with mixed anxiety and depressed mood: Secondary | ICD-10-CM | POA: Diagnosis not present

## 2021-08-11 NOTE — Progress Notes (Signed)
Psychiatric Initial Adult Assessment   Patient Identification: Denise Arnold MRN:  [id number] Date of Evaluation:  08/11/2021 Referral Source: Dr. Lavone Nian  Chief Complaint:   Visit Diagnosis adjustment disorder with an anxious mood  History of Present Illness:    Today the patient is doing very well.  She had no problems coming off the Xanax went on to the Ativan and then slowly came off of that as well.  She is off the Zoloft as well.  Her mood is great.  She no longer has anxiety.  She is functioning extremely well.  Her health is good financially she is stable and she has a good family and good marriage.  In retrospect I think she experienced an adjustment disorder with anxious mood.  Her care is complicated by side effects from her Zoloft and probably from her Xanax as well.  He is agreed that she will stay off all psychotropic medicines and that if she has any further anxiety problems she will contact me.  She is extremely well functioning very well and doing great.Virtual Visit via Telephone Note  I connected with Denise Arnold on 08/11/21 at  3:00 PM EST by telephone and verified that I am speaking with the correct person using two identifiers.  Location: Patient: home Provider: office   I discussed the limitations, risks, security and privacy concerns of performing an evaluation and management service by telephone and the availability of in person appointments. I also discussed with the patient that there may be a patient responsible charge related to this service. The patient expressed understanding and agreed to proceed.    I discussed the assessment and treatment plan with the patient. The patient was provided an opportunity to ask questions and all were answered. The patient agreed with the plan and demonstrated an understanding of the instructions.   The patient was advised to call back or seek an in-person evaluation if the symptoms worsen or if the condition fails to improve  as anticipated.  I provided 30 minutes of non-face-to-face time during this encounter.   Jerral Ralph, MD  Associated Signs/Symptoms: Depression Symptoms:   (Hypo) Manic Symptoms:     Anxiety Symptoms:   Psychotic Symptoms:     PTSD Symptoms: NA  Past Psychiatric History: None  Previous Psychotropic Medications: No   Substance Abuse History in the last 12 months:  No.  Consequences of Substance Abuse:    Past Medical History:  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HCC)    Scleroderma (HCC)    positive FANA, positive SCL-70 abs, raynauds, mild pulmonary hypertension, normal DLCO   Tricuspid regurgitation    Vitamin D deficiency     Past Surgical History:  Procedure Laterality Date   CESAREAN SECTION  1994   COLONOSCOPY WITH PROPOFOL N/A 05/29/2015   Procedure: COLONOSCOPY WITH PROPOFOL;  Surgeon: Manya Silvas, MD;  Location: [facility];  Service: Endoscopy;  Laterality: N/A;   DILATION AND CURETTAGE OF UTERUS  1992   DILATION AND CURETTAGE OF UTERUS  1993   DILATION AND CURETTAGE OF UTERUS  2011   VEIN SURGERY      Family Psychiatric History:   Family History:  Family History  Problem Relation Age of Onset   Heart disease Father        myocardial infarction  Arthritis/Rheumatoid Father    Hypertension Father    Hyperlipidemia Mother    Breast cancer Maternal Grandmother    Colon cancer Neg Hx     Social History:   Social History   Socioeconomic History   Marital status: Married    Spouse name: Not on file   Number of children: 1   Years of education: Not on file   Highest education level: Not on file  Occupational History   Not on file  Tobacco Use   Smoking status: Never   Smokeless tobacco: Never  Substance and Sexual Activity   Alcohol use: No    Alcohol/week: 0.0 standard drinks   Drug use:  No   Sexual activity: Not on file  Other Topics Concern   Not on file  Social History Narrative   Not on file   Social Determinants of Health   Financial Resource Strain: Not on file  Food Insecurity: Not on file  Transportation Needs: Not on file  Physical Activity: Not on file  Stress: Not on file  Social Connections: Not on file    Additional Social History:   Allergies:   Allergies  Allergen Reactions   Sulfa Antibiotics Nausea And Vomiting   Penicillins Rash    Metabolic Disorder Labs: No results found for: HGBA1C, MPG No results found for: PROLACTIN Lab Results  Component Value Date   CHOL 189 03/30/2020   TRIG 112.0 03/30/2020   HDL 57.30 03/30/2020   CHOLHDL 3 03/30/2020   VLDL 22.4 03/30/2020   LDLCALC 109 (H) 03/30/2020   LDLCALC 103 (H) 09/20/2019   Lab Results  Component Value Date   TSH 1.14 05/25/2021    Therapeutic Level Labs: No results found for: LITHIUM No results found for: CBMZ No results found for: VALPROATE  Current Medications: Current Outpatient Medications  Medication Sig Dispense Refill   amLODipine (NORVASC) 10 MG tablet Take 5 mg by mouth daily.   1   azelastine (ASTELIN) 0.1 % nasal spray PLACE 1 SPRAY INTO BOTH NOSTRILS 2 (TWO) TIMES DAILY. USE IN EACH NOSTRIL AS DIRECTED 30 mL 1   Biotin 5000 MCG TABS      Calcium-Magnesium-Vitamin D (CALCIUM 500 PO) Take by mouth.     Cholecalciferol (VITAMIN D) 50 MCG (2000 UT) tablet Take 2,000 Units by mouth daily.     clobetasol cream (TEMOVATE) 0.05 % Apply to elbows twice a day as needed. Avoid face, groin, axilla. 30 g 0   Cranberry 400 MG CAPS Take by mouth.     doxycycline (VIBRAMYCIN) 100 MG capsule Take 1 capsule (100 mg total) by mouth 2 (two) times daily for 10 days. 20 capsule 0   fish oil-omega-3 fatty acids 1000 MG capsule Take 1 g by mouth daily.     glucosamine-chondroitin 500-400 MG tablet Take 1 tablet by mouth daily.     LORazepam (ATIVAN) 0.5 MG tablet 1  qam   1  qhs   1  prn 75 tablet 2   MISC NATURAL PRODUCTS PO Herbal Supplement     Multiple Vitamin (MULTIVITAMIN) tablet Take 1 tablet by mouth daily.     mupirocin ointment (BACTROBAN) 2 % Apply topically daily.     nitroGLYCERIN (NITROGLYN) 2 % ointment Apply 0.5 inches topically 3 (three) times daily as needed.      omeprazole (PRILOSEC) 20 MG capsule Take 20 mg by mouth 2 (two) times daily.      predniSONE (STERAPRED UNI-PAK 21 TAB) 10 MG (21) TBPK tablet Take  by mouth daily. As directed 21 tablet 0   Probiotic Product (PROBIOTIC PO) Take by mouth.     Tavaborole (KERYDIN) 5 % SOLN Apply to affected toenail every night until improved. 10 mL 2   No current facility-administered medications for this visit.    Musculoskeletal: Strength & Muscle Tone: within normal limits Gait & Station: normal Patient leans: N/A  Psychiatric Specialty Exam: Review of Systems  Last menstrual period 02/14/2010.There is no height or weight on file to calculate BMI.  General Appearance: Casual  Eye Contact:  Good  Speech:  NA  Volume:  Normal  Mood:  Anxious  Affect:  Appropriate  Thought Process:  Coherent  Orientation:  Full (Time, Place, and Person)  Thought Content:  NA  Suicidal Thoughts:  No  Homicidal Thoughts:  No  Memory:  NA  Judgement:  Good  Insight:  Good  Psychomotor Activity:  Normal  Concentration:  Concentration: Good  Recall:  Good  Fund of Knowledge:Good  Language: Good  Akathisia:  No  Handed:  Right  AIMS (if indicated):  not done  Assets:  Desire for Improvement  ADL's:  Intact  Cognition: WNL  Sleep:  Good   Screenings: PHQ2-9    [facility] Office Visit from 06/22/2021 in [facility] Office Visit from 05/25/2021 in [facility] Office Visit from 10/02/2020 in [facility] Office Visit from 04/03/2020 in [facility] Office Visit from 01/03/2019 in [facility]  PHQ-2 Total Score 1 0  0 0 0  PHQ-9 Total Score -- -- -- -- 0      Flowsheet [facility] from 08/01/2021 in [facility] No Risk       Assessment and Plan:   Today's patient is doing well.  Her diagnosis is adjustment disorder with an anxious mood state and is in remission.  She no longer has anxiety.  She is functioning extremely well.  At this  point we will and are care with her.  She was told if things recur we will be glad to see her again.   Jerral Ralph, MD 1/25/20233:40 PM

## 2021-08-25 ENCOUNTER — Encounter: Payer: Self-pay | Admitting: Internal Medicine

## 2021-08-25 ENCOUNTER — Telehealth: Payer: Self-pay | Admitting: Internal Medicine

## 2021-08-25 NOTE — Telephone Encounter (Signed)
See my chart message

## 2021-08-25 NOTE — Telephone Encounter (Signed)
Patient went to [facility] and was put on Levosloxacin 500 mg, 1 tablet a day for 7 days for a sinus infection. Patient would like to know if this medication is ok for her to take.

## 2021-09-01 ENCOUNTER — Ambulatory Visit: Payer: BC Managed Care – PPO | Admitting: Internal Medicine

## 2021-09-06 ENCOUNTER — Other Ambulatory Visit: Payer: Self-pay

## 2021-09-06 ENCOUNTER — Encounter: Payer: Self-pay | Admitting: Internal Medicine

## 2021-09-06 ENCOUNTER — Ambulatory Visit
Admission: RE | Admit: 2021-09-06 | Discharge: 2021-09-06 | Disposition: A | Discharge status: Home / Self Care | Payer: BC Managed Care – PPO | Source: Ambulatory Visit | Attending: Emergency Medicine | Admitting: Emergency Medicine

## 2021-09-06 VITALS — BP 150/75 | HR 83 | Temp 98.1°F | Resp 18

## 2021-09-06 DIAGNOSIS — J020 Streptococcal pharyngitis: Secondary | ICD-10-CM | POA: Diagnosis not present

## 2021-09-06 LAB — POCT RAPID STREP A (OFFICE): Rapid Strep A Screen: POSITIVE — AB

## 2021-09-06 MED ORDER — AZITHROMYCIN 250 MG PO TABS: 250.0000 mg | ORAL_TABLET | Freq: Every day | ORAL | 0 refills | Status: DC

## 2021-09-06 NOTE — Discharge Instructions (Addendum)
You have strep throat.  Take the Zithromax as directed.  Follow up with your primary care provider or ENT.

## 2021-09-06 NOTE — ED Provider Notes (Signed)
[facility]    CSN: [id number] Arrival date & time: 09/06/21  1126      History   Chief Complaint Chief Complaint  Patient presents with   Sinus Pressure   Headache    HPI Denise Arnold is a 61 y.o. female.  Patient presents with 1 day history of sore throat and headache.  She has white exudate on her tonsils.  She also has sinus congestion and pressure.  No fever, rash, cough, shortness of breath, vomiting, diarrhea, or other symptoms.  Patient had an E-visit on 07/29/2021; diagnosed with strep pharyngitis; treated with Zithromax.  She was seen here on 08/01/2021; treated with doxycycline and prednisone taper; instructed to follow-up with ENT.  [facility]; diagnosed with sinusitis and cough; treated with Levaquin and prednisone.  Her symptoms improved.    The history is provided by the patient and medical records.   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HCC)    Scleroderma ([place])    positive FANA, positive SCL-70 abs, raynauds, mild pulmonary hypertension, normal DLCO   Tricuspid regurgitation    Vitamin D deficiency     Patient Active Problem List   Diagnosis Date Noted   Headache 06/22/2021   Anxiety 06/06/2021   Palpitations 05/28/2021   Diarrhea 05/25/2021   Fatigue 05/25/2021   Vaginal pain 03/03/2019   Axillary fullness 01/13/2019   UTI (urinary tract infection) 10/27/2018   Cellulitis 01/16/2018   Health care maintenance 01/25/2015   History of colonic polyps 04/22/2013   Vitamin D deficiency 01/15/2013   Degenerative cervical disc 01/15/2013   GERD (gastroesophageal reflux disease) 01/15/2013   Scleroderma ([place]) 07/17/2012   Raynauds phenomenon 07/17/2012    Past Surgical History:  Procedure Laterality Date   CESAREAN SECTION  1994   COLONOSCOPY WITH PROPOFOL N/A 05/29/2015    Procedure: COLONOSCOPY WITH PROPOFOL;  Surgeon: Manya Silvas, MD;  Location: [facility];  Service: Endoscopy;  Laterality: N/A;   DILATION AND CURETTAGE OF UTERUS  1992   DILATION AND CURETTAGE OF UTERUS  1993   DILATION AND CURETTAGE OF UTERUS  2011   VEIN SURGERY      OB History     Gravida  4   Para  1   Term      Preterm      AB  3   Living         SAB  3   IAB      Ectopic      Multiple      Live Births           Obstetric Comments  1st Menstrual Cycle:  13  1st Pregnancy:  28           Home Medications    Prior to Admission medications   Medication Sig Start Date End Date Taking? Authorizing Provider  azithromycin (ZITHROMAX) 250 MG tablet Take 1 tablet (250 mg total) by mouth daily. Take first 2 tablets together, then 1 every day until finished. 09/06/21  Yes [name], NP  amLODipine (NORVASC) 10 MG tablet Take 5 mg by mouth daily.  11/10/17   [provider]  azelastine (ASTELIN) 0.1 % nasal spray PLACE 1 SPRAY INTO BOTH NOSTRILS 2 (TWO) TIMES DAILY. USE IN EACH NOSTRIL AS DIRECTED 10/16/19  Einar Pheasant, MD  Biotin 5000 MCG TABS  09/21/18   [provider]  Calcium-Magnesium-Vitamin D (CALCIUM 500 PO) Take by mouth.    [provider]  Cholecalciferol (VITAMIN D) 50 MCG (2000 UT) tablet Take 2,000 Units by mouth daily.    [provider]  clobetasol cream (TEMOVATE) 0.05 % Apply to elbows twice a day as needed. Avoid face, groin, axilla. 06/16/21   Brendolyn Patty, MD  Cranberry 400 MG CAPS Take by mouth.    [provider]  fish oil-omega-3 fatty acids 1000 MG capsule Take 1 g by mouth daily.    [provider]  glucosamine-chondroitin 500-400 MG tablet Take 1 tablet by mouth daily.    [provider]  LORazepam (ATIVAN) 0.5 MG tablet 1  qam   1  qhs  1  prn 06/29/21   Norma Fredrickson, MD  MISC NATURAL PRODUCTS PO Herbal Supplement    [provider]  Multiple  Vitamin (MULTIVITAMIN) tablet Take 1 tablet by mouth daily.    [provider]  mupirocin ointment (BACTROBAN) 2 % Apply topically daily. 05/05/21   [provider]  nitroGLYCERIN (NITROGLYN) 2 % ointment Apply 0.5 inches topically 3 (three) times daily as needed.     [provider]  omeprazole (PRILOSEC) 20 MG capsule Take 20 mg by mouth 2 (two) times daily.     [provider]  predniSONE (STERAPRED UNI-PAK 21 TAB) 10 MG (21) TBPK tablet Take by mouth daily. As directed 08/01/21   [name], NP  Probiotic Product (PROBIOTIC PO) Take by mouth.    [provider]  Tavaborole (KERYDIN) 5 % SOLN Apply to affected toenail every night until improved. 06/16/21   Brendolyn Patty, MD    Family History Family History  Problem Relation Age of Onset   Heart disease Father        myocardial infarction   Arthritis/Rheumatoid Father    Hypertension Father    Hyperlipidemia Mother    Breast cancer Maternal Grandmother    Colon cancer Neg Hx     Social History Social History   Tobacco Use   Smoking status: Never   Smokeless tobacco: Never  Vaping Use   Vaping Use: Never used  Substance Use Topics   Alcohol use: No    Alcohol/week: 0.0 standard drinks   Drug use: No     Allergies   Sulfa antibiotics and Penicillins   Review of Systems Review of Systems  Constitutional:  Negative for chills and fever.  HENT:  Positive for congestion, sinus pressure and sore throat. Negative for ear pain, trouble swallowing and voice change.   Respiratory:  Negative for cough and shortness of breath.   Cardiovascular:  Negative for chest pain and palpitations.  Gastrointestinal:  Negative for diarrhea and vomiting.  Skin:  Negative for color change and rash.  Neurological:  Positive for headaches.  All other systems reviewed and are negative.   Physical Exam Triage Vital Signs ED Triage Vitals  Enc Vitals Group     BP 09/06/21 1200 (!) 150/75      Pulse Rate 09/06/21 1200 83     Resp 09/06/21 1200 18     Temp 09/06/21 1200 98.1 F (36.7 C)     Temp src --      SpO2 09/06/21 1200 98 %     Weight --      Height --      Head Circumference --  Peak Flow --      Pain Score 09/06/21 1209 5     Pain Loc --      Pain Edu? --      Excl. in [place]? --    No data found.  Updated Vital Signs BP (!) 150/75    Pulse 83    Temp 98.1 F (36.7 C)    Resp 18    LMP 02/14/2010    SpO2 98%   Visual Acuity Right Eye Distance:   Left Eye Distance:   Bilateral Distance:    Right Eye Near:   Left Eye Near:    Bilateral Near:     Physical Exam Vitals and nursing note reviewed.  Constitutional:      General: She is not in acute distress.    Appearance: Normal appearance. She is well-developed. She is not ill-appearing.  HENT:     Right Ear: Tympanic membrane normal.     Left Ear: Tympanic membrane normal.     Nose: Nose normal.     Mouth/Throat:     Pharynx: Oropharyngeal exudate and posterior oropharyngeal erythema present.  Cardiovascular:     Rate and Rhythm: Normal rate and regular rhythm.     Heart sounds: Normal heart sounds.  Pulmonary:     Effort: Pulmonary effort is normal. No respiratory distress.     Breath sounds: Normal breath sounds.  Abdominal:     Palpations: Abdomen is soft.     Tenderness: There is no abdominal tenderness.  Musculoskeletal:     Cervical back: Neck supple.  Skin:    General: Skin is warm and dry.  Neurological:     Mental Status: She is alert.  Psychiatric:        Mood and Affect: Mood normal.        Behavior: Behavior normal.     UC Treatments / Results  Labs (all labs ordered are listed, but only abnormal results are displayed) Labs Reviewed  POCT RAPID STREP A (OFFICE) - Abnormal; Notable for the following components:      Result Value   Rapid Strep A Screen Positive (*)    All other components within normal limits    EKG   Radiology No results  found.  Procedures Procedures (including critical care time)  Medications Ordered in UC Medications - No data to display  Initial Impression / Assessment and Plan / UC Course  I have reviewed the triage vital signs and the nursing notes.  Pertinent labs & imaging results that were available during my care of the patient were reviewed by me and considered in my medical decision making (see chart for details).    Strep pharyngitis.  Rapid strep positive.  Patient is allergic to penicillin; treating with Zithromax.  Discussed Tylenol or ibuprofen as needed for fever or discomfort.  Instructed patient to follow-up with her PCP or ENT to discuss her ongoing recurrent sinus symptoms.  She agrees to plan of care.  Final Clinical Impressions(s) / UC Diagnoses   Final diagnoses:  Strep pharyngitis     Discharge Instructions      You have strep throat.  Take the Zithromax as directed.  Follow up with your primary care provider or ENT.        ED Prescriptions     Medication Sig Dispense Auth. Provider   azithromycin (ZITHROMAX) 250 MG tablet Take 1 tablet (250 mg total) by mouth daily. Take first 2 tablets together, then 1 every day until finished. 6 tablet  [name], NP      PDMP not reviewed this encounter.   [name], NP 09/06/21 1255

## 2021-09-06 NOTE — ED Triage Notes (Signed)
Pt c/o sinus pressure, HA, and drainage for several weeks. [facility] and prescribed abx on 08/25/21 on 08/31/21 she was prescribed prednisone. She states her symptoms are worse.

## 2021-09-06 NOTE — Telephone Encounter (Signed)
Attempted to reach patient but was unable to leave message

## 2021-09-06 NOTE — Telephone Encounter (Signed)
Pt returning call

## 2021-09-06 NOTE — Telephone Encounter (Signed)
S/w pt -   Stated was seen at [facility]. DX w/ Strep Throat.  Given a Zpack.  Also was advised to sched w/ ENT - made an appointment for next Friday.  Will keep [place] updated as to how she is feeling.

## 2021-10-11 ENCOUNTER — Ambulatory Visit (INDEPENDENT_AMBULATORY_CARE_PROVIDER_SITE_OTHER): Payer: BC Managed Care – PPO | Admitting: Internal Medicine

## 2021-10-11 ENCOUNTER — Other Ambulatory Visit: Payer: Self-pay

## 2021-10-11 ENCOUNTER — Encounter: Payer: Self-pay | Admitting: Internal Medicine

## 2021-10-11 DIAGNOSIS — I7300 Raynaud's syndrome without gangrene: Secondary | ICD-10-CM

## 2021-10-11 DIAGNOSIS — R3915 Urgency of urination: Secondary | ICD-10-CM

## 2021-10-11 DIAGNOSIS — F419 Anxiety disorder, unspecified: Secondary | ICD-10-CM

## 2021-10-11 DIAGNOSIS — M349 Systemic sclerosis, unspecified: Secondary | ICD-10-CM | POA: Diagnosis not present

## 2021-10-11 DIAGNOSIS — K2100 Gastro-esophageal reflux disease with esophagitis, without bleeding: Secondary | ICD-10-CM

## 2021-10-11 DIAGNOSIS — Z86010 Personal history of colonic polyps: Secondary | ICD-10-CM | POA: Diagnosis not present

## 2021-10-11 MED ORDER — MUPIROCIN 2 % EX OINT: TOPICAL_OINTMENT | Freq: Every day | CUTANEOUS | 0 refills | Status: DC | PRN

## 2021-10-11 NOTE — Progress Notes (Signed)
Patient ID: Denise Arnold, female   DOB: Jul 18, 1961, 61 y.o.   MRN: [id number] ? ? ?Subjective:  ? ? Patient ID: Denise Arnold, female    DOB: 01-Dec-1960, 61 y.o.   MRN: [id number] ? ?This visit occurred during the SARS-CoV-2 public health emergency.  Safety protocols were in place, including screening questions prior to the visit, additional usage of staff PPE, and extensive cleaning of exam room while observing appropriate contact time as indicated for disinfecting solutions.  ? ?Patient here for scheduled follow up.  ? ?Chief Complaint  ?Patient presents with  ? Follow-up  ?  3 months, discuss about finger tips, possiblility on refilling Bactroban. See if you agree on seeing occupational therapy. Possible yeast infection going on, pt denies any urinary sx's frequency,pain,burning or discharge.  ? ?[facility] for [facility] was given Levofloxacin to treat, then went to [facility] for same issue and they stated that medication she was put on was blacklisted. Was referred to [facility] and CT scan of throat was normal.   ? .  ? ?HPI ?Recently evaluated on several occasions - diagnosed with strep, sinus congestion and cough.  Treated with zpak, then prednisone dose pack and then levaquin.  Had questions about levaquin.  On 2/20 - recurring symptoms - treated with zpak.  Saw ENT.  CT clear. Doing better now.  No increased sinus congestion currently.  No chest pain or sob reported.  No increased cough or congestion.  Has seen Dr Raul Del.  Breathing tests ok.  No acid reflux reported.  No abdominal pain.  Bowels moving.  Request refill on bactroban to help with tips of fingers.  Also fingers more contracted - request OT referral.   ? ? ?Past Medical History:  ?Diagnosis Date  ? Arthritis   ? GERD (gastroesophageal reflux disease)   ? History of endoscopy 04/05/2013  ? upper  ? Menorrhagia   ? Reynolds syndrome [facility])   ? Scleroderma ([place])   ? positive FANA, positive SCL-70 abs, raynauds, mild pulmonary hypertension, normal  DLCO  ? Tricuspid regurgitation   ? Vitamin D deficiency   ? ?Past Surgical History:  ?Procedure Laterality Date  ? [place]  ? COLONOSCOPY WITH PROPOFOL N/A 05/29/2015  ? Procedure: COLONOSCOPY WITH PROPOFOL;  Surgeon: Manya Silvas, MD;  Location: [facility] ENDOSCOPY;  Service: Endoscopy;  Laterality: N/A;  ? [place] OF UTERUS  1992  ? [place] OF UTERUS  1993  ? DILATION AND CURETTAGE OF UTERUS  2011  ? VEIN SURGERY    ? ?Family History  ?Problem Relation Age of Onset  ? Heart disease Father   ?     myocardial infarction  ? Arthritis/Rheumatoid Father   ? Hypertension Father   ? Hyperlipidemia Mother   ? Breast cancer Maternal Grandmother   ? Colon cancer Neg Hx   ? ?Social History  ? ?Socioeconomic History  ? Marital status: Married  ?  Spouse name: Not on file  ? Number of children: 1  ? Years of education: Not on file  ? Highest education level: Not on file  ?Occupational History  ? Not on file  ?Tobacco Use  ? Smoking status: Never  ? Smokeless tobacco: Never  ?Vaping Use  ? Vaping Use: Never used  ?Substance and Sexual Activity  ? Alcohol use: No  ?  Alcohol/week: 0.0 standard drinks  ? Drug use: No  ? Sexual activity: Not on file  ?Other Topics Concern  ?  Not on file  ?Social History Narrative  ? Not on file  ? ?Social Determinants of Health  ? ?Financial Resource Strain: Not on file  ?Food Insecurity: Not on file  ?Transportation Needs: Not on file  ?Physical Activity: Not on file  ?Stress: Not on file  ?Social Connections: Not on file  ? ? ? ?Review of Systems  ?Constitutional:  Negative for appetite change and unexpected weight change.  ?HENT:  Negative for congestion and sinus pressure.   ?Respiratory:  Negative for cough, chest tightness and shortness of breath.   ?Cardiovascular:  Negative for chest pain, palpitations and leg swelling.  ?Gastrointestinal:  Negative for abdominal pain, diarrhea, nausea and vomiting.  ?Genitourinary:  Negative for difficulty  urinating and dysuria.  ?Musculoskeletal:  Negative for joint swelling and myalgias.  ?Skin:  Negative for color change and rash.  ?Neurological:  Negative for dizziness, light-headedness and headaches.  ?Psychiatric/Behavioral:  Negative for agitation and dysphoric mood.   ? ?   ?Objective:  ?  ? ?BP 110/70 (BP Location: Left Arm, Patient Position: Sitting, Cuff Size: Normal)   Pulse 76   Temp 97.6 ?F (36.4 ?C) (Oral)   Resp 14   Ht '5\' 4"'$  (1.626 m)   Wt 161 lb 3.2 oz (73.1 kg)   LMP 02/14/2010   SpO2 97%   BMI 27.67 kg/m?  ?Wt Readings from Last 3 Encounters:  ?10/11/21 161 lb 3.2 oz (73.1 kg)  ?07/13/21 157 lb (71.2 kg)  ?07/05/21 157 lb 6.4 oz (71.4 kg)  ? ? ?Physical Exam ?Vitals reviewed.  ?Constitutional:   ?   General: She is not in acute distress. ?   Appearance: Normal appearance.  ?HENT:  ?   Head: Normocephalic and atraumatic.  ?   Right Ear: External ear normal.  ?   Left Ear: External ear normal.  ?Eyes:  ?   General: No scleral icterus.    ?   Right eye: No discharge.     ?   Left eye: No discharge.  ?   Conjunctiva/sclera: Conjunctivae normal.  ?Neck:  ?   Thyroid: No thyromegaly.  ?Cardiovascular:  ?   Rate and Rhythm: Normal rate and regular rhythm.  ?Pulmonary:  ?   Effort: No respiratory distress.  ?   Breath sounds: Normal breath sounds. No wheezing.  ?Abdominal:  ?   General: Bowel sounds are normal.  ?   Palpations: Abdomen is soft.  ?   Tenderness: There is no abdominal tenderness.  ?Musculoskeletal:     ?   General: No swelling or tenderness.  ?   Cervical back: Neck supple. No tenderness.  ?Lymphadenopathy:  ?   Cervical: No cervical adenopathy.  ?Skin: ?   Findings: No erythema or rash.  ?Neurological:  ?   Mental Status: She is alert.  ?Psychiatric:     ?   Mood and Affect: Mood normal.     ?   Behavior: Behavior normal.  ? ? ? ?Outpatient Encounter Medications as of 10/11/2021  ?Medication Sig  ? amLODipine (NORVASC) 10 MG tablet Take 5 mg by mouth daily.   ? azelastine (ASTELIN)  0.1 % nasal spray PLACE 1 SPRAY INTO BOTH NOSTRILS 2 (TWO) TIMES DAILY. USE IN EACH NOSTRIL AS DIRECTED  ? Biotin 5000 MCG TABS   ? Calcium-Magnesium-Vitamin D (CALCIUM 500 PO) Take by mouth.  ? Cholecalciferol (VITAMIN D) 50 MCG (2000 UT) tablet Take 2,000 Units by mouth daily.  ? clobetasol cream (TEMOVATE) 0.05 % Apply to  elbows twice a day as needed. Avoid face, groin, axilla.  ? Cranberry 400 MG CAPS Take by mouth.  ? fish oil-omega-3 fatty acids 1000 MG capsule Take 1 g by mouth daily.  ? glucosamine-chondroitin 500-400 MG tablet Take 1 tablet by mouth daily.  ? MISC NATURAL PRODUCTS PO Herbal Supplement  ? Multiple Vitamin (MULTIVITAMIN) tablet Take 1 tablet by mouth daily.  ? nitroGLYCERIN (NITROGLYN) 2 % ointment Apply 0.5 inches topically 3 (three) times daily as needed.   ? omeprazole (PRILOSEC) 20 MG capsule Take 20 mg by mouth 2 (two) times daily.   ? Probiotic Product (PROBIOTIC PO) Take by mouth.  ? Tavaborole (KERYDIN) 5 % SOLN Apply to affected toenail every night until improved.  ? [DISCONTINUED] mupirocin ointment (BACTROBAN) 2 % Apply topically daily.  ? mupirocin ointment (BACTROBAN) 2 % Apply topically daily as needed.  ? [DISCONTINUED] azithromycin (ZITHROMAX) 250 MG tablet Take 1 tablet (250 mg total) by mouth daily. Take first 2 tablets together, then 1 every day until finished.  ? [DISCONTINUED] LORazepam (ATIVAN) 0.5 MG tablet 1  qam   1  qhs  1  prn  ? [DISCONTINUED] predniSONE (STERAPRED UNI-PAK 21 TAB) 10 MG (21) TBPK tablet Take by mouth daily. As directed  ? ?No facility-administered encounter medications on file as of 10/11/2021.  ?  ? ?Lab Results  ?Component Value Date  ? WBC 6.9 06/22/2021  ? HGB 13.0 06/22/2021  ? HCT 40.3 06/22/2021  ? PLT 243.0 06/22/2021  ? GLUCOSE 87 06/22/2021  ? CHOL 189 03/30/2020  ? TRIG 112.0 03/30/2020  ? HDL 57.30 03/30/2020  ? LDLCALC 109 (H) 03/30/2020  ? ALT 11 05/25/2021  ? AST 13 05/25/2021  ? NA 139 06/22/2021  ? K 4.3 06/22/2021  ? CL 103  06/22/2021  ? CREATININE 0.78 06/22/2021  ? BUN 11 06/22/2021  ? CO2 29 06/22/2021  ? TSH 1.14 05/25/2021  ? ? ?   ?Assessment & Plan:  ? ?Problem List Items Addressed This Visit   ? ? Anxiety  ?  Doing much better now.

## 2021-10-12 LAB — URINE CULTURE
MICRO NUMBER:: 13183569
SPECIMEN QUALITY:: ADEQUATE

## 2021-10-12 LAB — URINALYSIS, ROUTINE W REFLEX MICROSCOPIC
Bilirubin Urine: NEGATIVE
Hgb urine dipstick: NEGATIVE
Ketones, ur: NEGATIVE
Leukocytes,Ua: NEGATIVE
Nitrite: NEGATIVE
RBC / HPF: NONE SEEN (ref 0–?)
Specific Gravity, Urine: <=1.005 — AB (ref 1.000–1.030)
Total Protein, Urine: NEGATIVE
Urine Glucose: NEGATIVE
Urobilinogen, UA: 0.2 (ref 0.0–1.0)
WBC, UA: NONE SEEN (ref 0–?)
pH: 6 (ref 5.0–8.0)

## 2021-10-17 ENCOUNTER — Encounter: Payer: Self-pay | Admitting: Internal Medicine

## 2021-10-17 NOTE — Assessment & Plan Note (Signed)
Check urine to confirm no infection.  

## 2021-10-17 NOTE — Assessment & Plan Note (Signed)
Followed by rheumatology.  Fingers feel more contracted.  Request referral to OT for evaluation.  bactroban - prn - finger tips.   ?

## 2021-10-17 NOTE — Assessment & Plan Note (Signed)
No acid reflux reported.  Prilosec.  

## 2021-10-17 NOTE — Assessment & Plan Note (Signed)
Colonoscopy 03/2021.  

## 2021-10-17 NOTE — Assessment & Plan Note (Signed)
On low dose amlodipine.  ?

## 2021-10-17 NOTE — Assessment & Plan Note (Signed)
Doing much better now.  Tapering off zoloft.  On lorazepam.  Seeing psychiatry.  Feels better.  Follow.  ?

## 2021-10-25 ENCOUNTER — Ambulatory Visit: Payer: BC Managed Care – PPO | Attending: Internal Medicine | Admitting: Occupational Therapy

## 2021-10-25 ENCOUNTER — Encounter: Payer: Self-pay | Admitting: Occupational Therapy

## 2021-10-25 DIAGNOSIS — M25642 Stiffness of left hand, not elsewhere classified: Secondary | ICD-10-CM

## 2021-10-25 DIAGNOSIS — M25641 Stiffness of right hand, not elsewhere classified: Secondary | ICD-10-CM

## 2021-10-25 NOTE — Therapy (Signed)
?OUTPATIENT OCCUPATIONAL THERAPY ORTHO EVALUATION ? ?Patient Name: Margaretta Chittum Wixom ?MRN: [id number] ?DOB:1961-01-20, 61 y.o., female ?Today's Date: 10/25/2021 ? ?PCP: Einar Pheasant, MD ?REFERRING PROVIDER: Einar Pheasant, MD ? ? OT End of Session - 10/25/21 1751   ? ? Visit Number 1   ? Number of Visits 7   ? Date for OT Re-Evaluation 12/06/21   ? OT Start Time 1430   ? OT Stop Time 6720   ? OT Time Calculation (min) 46 min   ? Activity Tolerance Patient tolerated treatment well   ? Behavior During Therapy [facility] for tasks assessed/performed   ? ?  ?  ? ?  ? ? ?Past Medical History:  ?Diagnosis Date  ? Arthritis   ? GERD (gastroesophageal reflux disease)   ? History of endoscopy 04/05/2013  ? upper  ? Menorrhagia   ? Reynolds syndrome [facility])   ? Scleroderma ([place])   ? positive FANA, positive SCL-70 abs, raynauds, mild pulmonary hypertension, normal DLCO  ? Tricuspid regurgitation   ? Vitamin D deficiency   ? ?Past Surgical History:  ?Procedure Laterality Date  ? [place]  ? COLONOSCOPY WITH PROPOFOL N/A 05/29/2015  ? Procedure: COLONOSCOPY WITH PROPOFOL;  Surgeon: Manya Silvas, MD;  Location[facility] ENDOSCOPY;  Service: Endoscopy;  Laterality: N/A;  ? [place] OF UTERUS  1992  ? [place] OF UTERUS  1993  ? DILATION AND CURETTAGE OF UTERUS  2011  ? VEIN SURGERY    ? ?Patient Active Problem List  ? Diagnosis Date Noted  ? Urinary urgency 10/11/2021  ? Headache 06/22/2021  ? Anxiety 06/06/2021  ? Palpitations 05/28/2021  ? Diarrhea 05/25/2021  ? Fatigue 05/25/2021  ? Vaginal pain 03/03/2019  ? Axillary fullness 01/13/2019  ? UTI (urinary tract infection) 10/27/2018  ? Cellulitis 01/16/2018  ? Health care maintenance 01/25/2015  ? History of colonic polyps 04/22/2013  ? Vitamin D deficiency 01/15/2013  ? Degenerative cervical disc 01/15/2013  ? GERD (gastroesophageal reflux disease) 01/15/2013  ? Scleroderma ([place]) 07/17/2012  ? Raynauds phenomenon 07/17/2012  ? ? ?ONSET DATE:  10/11/21 ? ?REFERRING DIAG: Scleroderma with hand and finger stiffness  ?THERAPY DIAG:  ?Stiffness of left hand, not elsewhere classified ? ?Stiffness of right hand, not elsewhere classified ? ?SUBJECTIVE:  ? ?SUBJECTIVE STATEMENT: ?I asked by family doctor to refer me to OT because of increased hand and finger stiffness because of my scleroderma.  When I seen Dr. Manuella Ghazi last year he did recommend OT but I do not think a referral was sent.  I had COVID last year and really had a flareup ?Pt accompanied by: self ? ?PERTINENT HISTORY: Patient seen 10/11/2021 family physician-Per patient she has scleroderma since late 87s. ?Ambulatory referral to Occupational Therapy   ?  Scleroderma ([place])  ?    Followed by rheumatology.  Fingers feel more contracted.  Request referral to OT for evaluation.  bactroban - prn - finger tips.    ?  ? ? ?PRECAUTIONS: None ? ? [facility] OT Assessment - 10/25/21 0001   ? ?  ? Assessment  ? Medical Diagnosis Scleroderma   ? Referring Provider (OT) Nicki Reaper   ? Onset Date/Surgical Date 10/11/21   Referred by Dr.  ? Hand Dominance Right   ?  ? Home  Environment  ? Lives With Family   ?  ? Prior Function  ? Vocation Retired   ? 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  ? AROM  ? Right Wrist Extension 75 Degrees   ? Right Wrist Flexion 80 Degrees   ? Left Wrist Extension 80 Degrees   ? Left Wrist Flexion 90 Degrees   ?  ? Strength  ? Right Hand Grip (lbs) 54   ? Right Hand Lateral Pinch 16 lbs   ? Right Hand 3 Point Pinch 16 lbs   ? Left Hand Grip (lbs) 50   ? Left Hand Lateral Pinch 15 lbs   ? Left Hand 3 Point Pinch 15 lbs   ?  ? Right Hand AROM  ? R Thumb Opposition to Index --   base of 5th  ? R Index  MCP 0-90 80 Degrees   ? R Index PIP 0-100 95 Degrees   -30  ? R Long  MCP 0-90 90 Degrees   ? R Long PIP 0-100 100 Degrees   -15  ? R Ring  MCP 0-90 90 Degrees   ? R Ring PIP 0-100 100 Degrees   ? R Little  MCP  0-90 90 Degrees   ? R Little PIP 0-100 100 Degrees   -45  ?  ? Left Hand AROM  ? L Thumb Opposition to Index --   base of 5th  ? L Index  MCP 0-90 80 Degrees   ? L Index PIP 0-100 100 Degrees   -15  ? L Long  MCP 0-90 90 Degrees   ? L Long PIP 0-100 100 Degrees   ? L Ring  MCP 0-90 90 Degrees   ? L Ring PIP 0-100 100 Degrees   ? L Little  MCP 0-90 90 Degrees   ? L Little PIP 0-100 95 Degrees   ? ?  ?  ? ?  ?  ? ?TODAY'S TREATMENT:  ?Done moist heat with LMB PIP extension splints on right second, third, fifth and left second digit-patient provided 2 splints that she can alternate during moist heat for PIP extension.  Patient should not use it more than 2 minutes at a time alternating digits.  Patient added on home program for soft tissue massage and rolling over volar and gentle passive range of motion behind PIP extension.  She can continue with tendon glides but do not squeeze or work on strengthening.  Discussed joint protection not to use a static grip for long peers of time focus on PIP extension.  As well as opposition to all digits for fine motor control.  And also could work in the shower on her wrist flexibility. ? ? ?PATIENT EDUCATION: ?Home program patient was educated on reviewed and demonstrated by OT and followed by patient as well as handout  ? ? ?HOME EXERCISE PROGRAM: ?See session today ? ?GOALS: ?Goals reviewed with patient? Yes ? ?SHORT TERM GOALS: Target date: 11/08/2021 ? ?Patient to be independent with home program using splints and doing active/PROM to decrease flexor contractures of PIPs and bilateral hands ?Baseline: Patient no knowledge of home program or splint use.  Right hand second digit, third, fifth with flexor contractures ?Goal status: INITIAL ? ?LONG TERM GOALS: Target date: 12/06/2021 ? ?Patient to show increase extension of all digits to prevent flexion contractures to worsen and patient to be able to get gloves on and put hand in pocket ?Baseline: Patient no knowledge of home  program as well as joint protection and splint use patient with flexor contractures in  4 digits at PIPs.  At risk for worsening of flexor contractures ?Goal status: INITIAL ? ?2.  Patient verbalized 3 joint protection and modifications to test to decrease risk for flexor contractures and maintain her flexion and extension of digits and strength in bilateral hands ?Baseline: Patient with increased flexor PIP contractures at 4 of her digits and asking for modifications to prevent further loss of range of motion in hands ?Goal status: INITIAL ? ? ?ASSESSMENT: ? ?CLINICAL IMPRESSION: ?Patient presented OT evaluation with a diagnosis of scleroderma that per patient she had since age 49s.  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  Patient had COVID last year and reports that she had a flareup in symptoms.  She also reports increased stiffness in her digits of bilateral hands.  Right hand worse than left.  Patient is right-handed.  Patient with PIP flexion contractures in 4-5 digits with stiffness in wrist and digits flexion.  Patient can benefit from OT to decrease stiffness, decreased flexor contracture, splint use to increase and maintaining of mobility and flexibility in bilateral hands to be independent in ADLs and IADLs. ?PERFORMANCE DEFICITS in functional skills including ADLs, IADLs, coordination, dexterity, ROM, strength, flexibility, and UE functional use, c ? ?IMPAIRMENTS are limiting patient from ADLs, IADLs, play, leisure, and social participation.  ? ?COMORBIDITIES may have co-morbidities  that affects occupational performance. Patient will benefit from skilled OT to address above impairments and improve overall function. ? ?MODIFICATION OR ASSISTANCE TO COMPLETE EVALUATION: Min-Moderate modification of tasks or assist with assess necessary to complete an evaluation. ? ?OT OCCUPATIONAL PROFILE AND HISTORY: Detailed assessment: Review of  records and additional review of physical, cognitive, psychosocial history related to current functional performance. ? ?CLINICAL DECISION MAKING: Moderate - several treatment options, min-mod task modification necess

## 2021-11-08 ENCOUNTER — Encounter: Payer: Self-pay | Admitting: Occupational Therapy

## 2021-11-08 ENCOUNTER — Ambulatory Visit: Payer: BC Managed Care – PPO | Admitting: Occupational Therapy

## 2021-11-08 DIAGNOSIS — M25642 Stiffness of left hand, not elsewhere classified: Secondary | ICD-10-CM | POA: Diagnosis not present

## 2021-11-08 DIAGNOSIS — M25641 Stiffness of right hand, not elsewhere classified: Secondary | ICD-10-CM

## 2021-11-08 NOTE — Therapy (Signed)
[facility] ?[address] ?[place], [place], [address] ?Phone: [phone]   Fax:  [phone] ? ?Occupational Therapy Treatment ? ?Patient Details  ?Name: Denise Arnold ?MRN: [id number] ?Date of Birth: 07-22-1960 ?Referring Provider (OT): Nicki Reaper ? ? ?Encounter Date: 11/08/2021 ? ? OT End of Session - 11/08/21 1237   ? ? Visit Number 2   ? Number of Visits 7   ? Date for OT Re-Evaluation 12/06/21   ? OT Start Time 1200   ? OT Stop Time 1225   ? OT Time Calculation (min) 25 min   ? Activity Tolerance Patient tolerated treatment well   ? Behavior During Therapy [facility] for tasks assessed/performed   ? ?  ?  ? ?  ? ? ?Past Medical History:  ?Diagnosis Date  ? Arthritis   ? GERD (gastroesophageal reflux disease)   ? History of endoscopy 04/05/2013  ? upper  ? Menorrhagia   ? Reynolds syndrome [facility])   ? Scleroderma ([place])   ? positive FANA, positive SCL-70 abs, raynauds, mild pulmonary hypertension, normal DLCO  ? Tricuspid regurgitation   ? Vitamin D deficiency   ? ? ?Past Surgical History:  ?Procedure Laterality Date  ? [place]  ? COLONOSCOPY WITH PROPOFOL N/A 05/29/2015  ? Procedure: COLONOSCOPY WITH PROPOFOL;  Surgeon: Manya Silvas, MD;  Location: [facility] ENDOSCOPY;  Service: Endoscopy;  Laterality: N/A;  ? [place] OF UTERUS  1992  ? [place] OF UTERUS  1993  ? DILATION AND CURETTAGE OF UTERUS  2011  ? VEIN SURGERY    ? ? ?There were no vitals filed for this visit. ? ? Subjective Assessment - 11/08/21 1238   ? ? Subjective  I am doing okay I finished my antibiotic but that left middle finger still bothering me and the tip of the small finger on my right hand.  I was on vacation at the beach so did some of the exercises.  The moist heat and then the little spring splints.   ? Pertinent History Patient seen 10/11/2021 family physician-Per patient she has scleroderma since late 85s.  Ambulatory referral to Occupational  Therapy      Scleroderma ([place])        Followed by rheumatology.  Fingers feel more contracted.  Request referral to OT for evaluation.  bactroban - prn - finger tips.   ? Patient Stated Goals I wanted to know what to do that my hands does not get worse   ? Currently in Pain? No/denies   ? ?  ?  ? ?  ? ? ? ? ? [facility] OT Assessment - 11/08/21 0001   ? ?  ? AROM  ? Right Wrist Extension 75 Degrees   ? Right Wrist Flexion 80 Degrees   ? Left Wrist Extension 80 Degrees   ? Left Wrist Flexion 90 Degrees   ?  ? Strength  ? Right Hand Grip (lbs) 54   ? Right Hand Lateral Pinch 16 lbs   ? Right Hand 3 Point Pinch 16 lbs   ? Left Hand Grip (lbs) 50   ? Left Hand Lateral Pinch 15 lbs   ? Left Hand 3 Point Pinch 15 lbs   ?  ? Right Hand AROM  ? R Index  MCP 0-90 80 Degrees   ? R Index PIP 0-100 95 Degrees   -25  ? R Long  MCP 0-90 90 Degrees   ?  R Long PIP 0-100 100 Degrees   -20  ? R Ring  MCP 0-90 90 Degrees   ? R Ring PIP 0-100 100 Degrees   ? R Little  MCP 0-90 90 Degrees   ? R Little PIP 0-100 100 Degrees   -3-  ?  ? Left Hand AROM  ? L Thumb Opposition to Index --   Opposition to base of fifth  ? L Index  MCP 0-90 80 Degrees   ? L Index PIP 0-100 100 Degrees   -10  ? L Long  MCP 0-90 90 Degrees   ? L Long PIP 0-100 100 Degrees   ? L Ring  MCP 0-90 90 Degrees   ? L Ring PIP 0-100 100 Degrees   ? L Little  MCP 0-90 90 Degrees   ? L Little PIP 0-100 95 Degrees   ? ?  ?  ? ?  ? ?  Treatment session today: ?Patient return after being at the beach for 2 weeks.  Patient made progress in PIP extensions of 4 digit that had flexion contractures except third digit on right hand.  She was using a 10-day antibiotic when seen last time. ?Reviewed with patient using the PIP extention LMB splint to decrease flexor contractures-adjusting and wearing them appropriately and correctly without increasing inflammation and pain at PIPs. ?Patient was able to maintain her flexion in all digits reinforced again that strength is not her issue is  motion. ?Grip and prehension strength continue to be in top range for her age. ?Reviewed with patient again joint protection principles and adaptive equipment to decrease pain increase and maintain motion in bilateral hands. 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  Reviewed with patient her home program again and patient wants to follow-up in 1 to 2 months. ? ?GOALS: ?Goals reviewed with patient? Yes ?  ?SHORT TERM GOALS: Target date: 11/08/2021 ?  ?Patient to be independent with home program using splints and doing active/PROM to decrease flexor contractures of PIPs and bilateral hands ?Baseline: Patient no knowledge of home program or splint use.  Right hand second digit, third, fifth with flexor contractures ?Goal status: INITIAL ?  ?LONG TERM GOALS: Target date: 12/06/2021 ?  ?Patient to show increase extension of all digits to prevent flexion contractures to worsen and patient to be able to get gloves on and put hand in pocket ?Baseline: Patient no knowledge of home program as well as joint protection and splint use patient with flexor contractures in 4 digits at PIPs.  At risk for worsening of flexor contractures ?Goal status: INITIAL ?  ?2.  Patient verbalized 3 joint protection and modifications to test to decrease risk for flexor contractures and maintain her flexion and extension of digits and strength in bilateral hands ?Baseline: Patient with increased flexor PIP contractures at 4 of her digits and asking for modifications to prevent further loss of range of motion in hands ?Goal status: INITIAL ?  ?  ?ASSESSMENT: ?  ?CLINICAL IMPRESSION: ?Patient presented OT evaluation  2 wks ago with a diagnosis of scleroderma that per patient she had since age 85s.  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  Patient had COVID  last year and reports that she had a flareup in symptoms.  She also reports increased stiffness in  her digits of bilateral hands.  Right hand worse than left.  Patient is right-handed.  Patient with PIP flexion contractures in 4-5 digits with stiffness in wrist and digits flexion.  Patient returned this date after being 2 weeks on vacation and doing her home program.  She did finish her antibiotic of 10 days for a flareup in the right PIP digit.  Reviewed with patient home program again.  Using LMB PIP extension splints during moist heat, soft tissue-increasing and preventing PIP flexion contractures.  Patient to continue 1 to 2 months with home program and want to follow-up with OT to assess if was able to improve and maintain her motion in hands /digits.  Patient can benefit from OT to decrease stiffness, decreased flexor contracture, splint use to increase and maintaining of mobility and flexibility in bilateral hands to be independent in ADLs and IADLs. ?PERFORMANCE DEFICITS in functional skills including ADLs, IADLs, coordination, dexterity, ROM, strength, flexibility, and UE functional use, c ?  ?IMPAIRMENTS are limiting patient from ADLs, IADLs, play, leisure, and social participation.  ?  ?COMORBIDITIES may have co-morbidities  that affects occupational performance. Patient will benefit from skilled OT to address above impairments and improve overall function. ?  ?MODIFICATION OR ASSISTANCE TO COMPLETE EVALUATION: Min-Moderate modification of tasks or assist with assess necessary to complete an evaluation. ?  ?OT OCCUPATIONAL PROFILE AND HISTORY: Detailed assessment: Review of records and additional review of physical, cognitive, psychosocial history related to current functional performance. ?  ?CLINICAL DECISION MAKING: Moderate - several treatment options, min-mod task modification necessary ?  ?REHAB POTENTIAL: FAIR-chronic condition ?  ?EVALUATION COMPLEXITY: Moderate ?  ?  ?  ?  ?PLAN: ?OT FREQUENCY:  1x/week ?  ?OT DURATION: 6 weeks ?  ?PLANNED INTERVENTIONS: self care/ADL training, therapeutic exercise, manual therapy, passive range of motion, splinting, paraffin, moist heat, and DME and/or AE instructions

## 2021-12-14 ENCOUNTER — Other Ambulatory Visit: Payer: Self-pay | Admitting: Internal Medicine

## 2021-12-14 DIAGNOSIS — Z1231 Encounter for screening mammogram for malignant neoplasm of breast: Secondary | ICD-10-CM

## 2021-12-21 ENCOUNTER — Ambulatory Visit (INDEPENDENT_AMBULATORY_CARE_PROVIDER_SITE_OTHER): Payer: BC Managed Care – PPO | Admitting: Internal Medicine

## 2021-12-21 ENCOUNTER — Encounter: Payer: Self-pay | Admitting: Internal Medicine

## 2021-12-21 DIAGNOSIS — R5383 Other fatigue: Secondary | ICD-10-CM

## 2021-12-21 DIAGNOSIS — I7300 Raynaud's syndrome without gangrene: Secondary | ICD-10-CM

## 2021-12-21 DIAGNOSIS — R0789 Other chest pain: Secondary | ICD-10-CM

## 2021-12-21 DIAGNOSIS — K2100 Gastro-esophageal reflux disease with esophagitis, without bleeding: Secondary | ICD-10-CM | POA: Diagnosis not present

## 2021-12-21 DIAGNOSIS — F419 Anxiety disorder, unspecified: Secondary | ICD-10-CM | POA: Diagnosis not present

## 2021-12-21 DIAGNOSIS — Z86010 Personal history of colonic polyps: Secondary | ICD-10-CM | POA: Diagnosis not present

## 2021-12-21 DIAGNOSIS — E559 Vitamin D deficiency, unspecified: Secondary | ICD-10-CM

## 2021-12-21 DIAGNOSIS — M349 Systemic sclerosis, unspecified: Secondary | ICD-10-CM

## 2021-12-21 NOTE — Progress Notes (Addendum)
Patient ID: Denise Arnold, female   DOB: Dec 06, 1960, 61 y.o.   MRN: [id number]   Subjective:    Patient ID: Denise Arnold, female    DOB: Nov 18, 1960, 61 y.o.   MRN: [id number]   Patient here for a scheduled follow up. Marland Kitchen   HPI Here to follow up regarding scleroderma - finger ulceration, increased stress and fatigue.  Has been having issues with her middle finger - left hand.  Has been seen at [facility] and Dr Posey Pronto (rheumatology).  Has been on clindamycin.  Saw Dr Peggye Ley today.  Recommended soaking, Dawn, polysporin and bactroban.  Has f/u planned 01/11/22.   Reports increased fatigue.  Labs checked through rheumatology. B12 low.  Discussed replacement.  Also reports burning - bottom of her feet and reports top of feet red.  Discussed neuropathy.  Reports increased hot flashes.  Increased anxiety.  Feels better when exercising.  Has noticed some tightness and does request referral back to cardiology for reevaluation.  Having issues with dry eyes. Eye doctor recommended restasis.  Also increased joint pain.  Discussed with Dr Posey Pronto.  Recommended starting on plaquenil.  Had questions about side effects.  She is eating.  No nausea or vomiting.  Bowels moving.     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[facility])    Scleroderma ([facility])    positive FANA, positive SCL-70 abs, raynauds, mild pulmonary hypertension, normal DLCO   Tricuspid regurgitation    Vitamin D deficiency    Past Surgical History:  Procedure Laterality Date   [place] SECTION  1994   COLONOSCOPY WITH PROPOFOL N/A 05/29/2015   Procedure: COLONOSCOPY WITH PROPOFOL;  Surgeon: Manya Silvas, MD;  Location: [facility];  Service: Endoscopy;  Laterality: N/A;   DILATION AND CURETTAGE OF UTERUS  1992   DILATION AND CURETTAGE OF UTERUS  1993   DILATION AND CURETTAGE OF UTERUS  2011   VEIN SURGERY     Family  History  Problem Relation Age of Onset   Heart disease Father        myocardial infarction   Arthritis/Rheumatoid Father    Hypertension Father    Hyperlipidemia Mother    Breast cancer Maternal Grandmother    Colon cancer Neg Hx    Social History   Socioeconomic History   Marital status: Married    Spouse name: Not on file   Number of children: 1   Years of education: Not on file   Highest education level: Not on file  Occupational History   Not on file  Tobacco Use   Smoking status: Never   Smokeless tobacco: Never  Vaping Use   Vaping Use: Never used  Substance and Sexual Activity   Alcohol use: No    Alcohol/week: 0.0 standard drinks of alcohol   Drug use: No   Sexual activity: Not on file  Other Topics Concern   Not on file  Social History Narrative   Not on file   Social Determinants of Health   Financial Resource Strain: Not on file  Food Insecurity: Not on file  Transportation Needs: Not on file  Physical Activity: Not on file  Stress: Not on file  Social Connections: Not on file     Review of Systems  Constitutional:  Positive for fatigue. Negative for appetite change and unexpected weight change.  HENT:  Negative for congestion and sinus pressure.   Respiratory:  Negative for cough and shortness of breath.        Does report occasional chest tightness.   Cardiovascular:  Negative for chest pain, palpitations and leg swelling.  Gastrointestinal:  Negative for abdominal pain, diarrhea, nausea and vomiting.  Genitourinary:  Negative for difficulty urinating and dysuria.  Musculoskeletal:  Negative for myalgias.       Joint pain as outlined.   Skin:        Skin ulceration - left middle finger.    Neurological:  Negative for dizziness and headaches.  Psychiatric/Behavioral:  Negative for dysphoric mood.        Increased stress and anxiety as outlined.        Objective:     BP 126/80 (BP Location: Left Arm, Patient Position: Sitting, Cuff Size:  Small)   Pulse 67   Temp 98.6 F (37 C) (Temporal)   Resp 16   Ht '5\' 4"'$  (1.626 m)   Wt 159 lb 6.4 oz (72.3 kg)   LMP 02/14/2010   SpO2 99%   BMI 27.36 kg/m  Wt Readings from Last 3 Encounters:  12/21/21 159 lb 6.4 oz (72.3 kg)  10/11/21 161 lb 3.2 oz (73.1 kg)  07/13/21 157 lb (71.2 kg)    Physical Exam Vitals reviewed.  Constitutional:      General: She is not in acute distress.    Appearance: Normal appearance.  HENT:     Head: Normocephalic and atraumatic.     Right Ear: External ear normal.     Left Ear: External ear normal.  Eyes:     General: No scleral icterus.       Right eye: No discharge.        Left eye: No discharge.     Conjunctiva/sclera: Conjunctivae normal.  Neck:     Thyroid: No thyromegaly.  Cardiovascular:     Rate and Rhythm: Normal rate and regular rhythm.  Pulmonary:     Effort: No respiratory distress.     Breath sounds: Normal breath sounds. No wheezing.  Abdominal:     General: Bowel sounds are normal.     Palpations: Abdomen is soft.     Tenderness: There is no abdominal tenderness.  Musculoskeletal:        General: No swelling or tenderness.     Cervical back: Neck supple. No tenderness.  Lymphadenopathy:     Cervical: No cervical adenopathy.  Skin:    Findings: No rash.     Comments: Ulceration - left middle finger minimal surrounding erythema.   Neurological:     Mental Status: She is alert.  Psychiatric:        Mood and Affect: Mood normal.        Behavior: Behavior normal.      Outpatient Encounter Medications as of 12/21/2021  Medication Sig   amLODipine (NORVASC) 10 MG tablet Take 5 mg by mouth daily.    azelastine (ASTELIN) 0.1 % nasal spray PLACE 1 SPRAY INTO BOTH NOSTRILS 2 (TWO) TIMES DAILY. USE IN EACH NOSTRIL AS DIRECTED   Biotin 5000 MCG TABS    Calcium-Magnesium-Vitamin D (CALCIUM 500 PO) Take by mouth.   Cholecalciferol (VITAMIN D) 50 MCG (2000 UT) tablet Take 2,000 Units by mouth daily.   clobetasol cream  (TEMOVATE) 0.05 % Apply to elbows twice a day as needed. Avoid face, groin, axilla.   Cranberry 400 MG CAPS Take by mouth.   fish oil-omega-3 fatty acids 1000  MG capsule Take 1 g by mouth daily.   glucosamine-chondroitin 500-400 MG tablet Take 1 tablet by mouth daily.   MISC NATURAL PRODUCTS PO Herbal Supplement   Multiple Vitamin (MULTIVITAMIN) tablet Take 1 tablet by mouth daily.   mupirocin ointment (BACTROBAN) 2 % Apply topically daily as needed.   nitroGLYCERIN (NITROGLYN) 2 % ointment Apply 0.5 inches topically 3 (three) times daily as needed.    omeprazole (PRILOSEC) 20 MG capsule Take 20 mg by mouth 2 (two) times daily.    Probiotic Product (PROBIOTIC PO) Take by mouth.   Tavaborole (KERYDIN) 5 % SOLN Apply to affected toenail every night until improved.   No facility-administered encounter medications on file as of 12/21/2021.     Lab Results  Component Value Date   WBC 6.9 06/22/2021   HGB 13.0 06/22/2021   HCT 40.3 06/22/2021   PLT 243.0 06/22/2021   GLUCOSE 87 06/22/2021   CHOL 189 03/30/2020   TRIG 112.0 03/30/2020   HDL 57.30 03/30/2020   LDLCALC 109 (H) 03/30/2020   ALT 11 05/25/2021   AST 13 05/25/2021   NA 139 06/22/2021   K 4.3 06/22/2021   CL 103 06/22/2021   CREATININE 0.78 06/22/2021   BUN 11 06/22/2021   CO2 29 06/22/2021   TSH 1.14 05/25/2021       Assessment & Plan:   Problem List Items Addressed This Visit     Anxiety    Increased stress and anxiety as outlined - related to her medical concerns/issues.  Discussed.  Follow closely.  Previously on zoloft.       Chest tightness    Reports noticing some intermittent chest tightness.  Discussed.  Actually feels better when exercising.  Previously saw Dr Corky Sox for palpitations.  Discussed EKG today.  States not having symptoms currently - will hold.  Request f/u with cardiology - to confirm no further w/up/evaluation warranted - given history of scleroderma - question of need for f/u echo.         Relevant Orders   Ambulatory referral to Cardiology   Fatigue    Increased fatigue.  Start oral B12 supplements.  Treat current infection.  Follow.       Relevant Orders   CBC with Differential/Platelet   TSH   GERD (gastroesophageal reflux disease)    No acid reflux reported.  Prilosec.       History of colonic polyps    Colonoscopy 03/2021.       Raynauds phenomenon    Per rheumatology - continue amlodipine.       Scleroderma ([place])    Followed by rheumatology.  Now seeing Dr Posey Pronto. On low dose amlodipine.  Infection/ulcer - finger.   Seeing Dr Peggye Ley and Dr Posey Pronto.  Treated with clindamycin.  Recommended soaks, etc as outlined.  Call with update.          Relevant Orders   Ambulatory referral to Cardiology   Vitamin D deficiency    Checked 11/2021 - wnl.  Follow.        I spent 45 minutes with the patient.  Time spent discussing her current concerns and symptoms. Specifically time spent discussing her scleroderma/finger ulceration, stress and anxiety.  Time also spent discussing further w/up, evaluation and treatment.   Einar Pheasant, MD

## 2021-12-24 ENCOUNTER — Telehealth: Payer: Self-pay | Admitting: Internal Medicine

## 2021-12-24 DIAGNOSIS — E559 Vitamin D deficiency, unspecified: Secondary | ICD-10-CM

## 2021-12-24 DIAGNOSIS — Z13220 Encounter for screening for lipoid disorders: Secondary | ICD-10-CM

## 2021-12-24 DIAGNOSIS — M349 Systemic sclerosis, unspecified: Secondary | ICD-10-CM

## 2021-12-24 NOTE — Telephone Encounter (Signed)
Patient has a lab appt 12/28/21, there are no orders in.

## 2021-12-25 NOTE — Addendum Note (Signed)
Addended by: Alisa Graff on: 12/25/2021 10:03 AM   Modules accepted: Orders

## 2021-12-25 NOTE — Telephone Encounter (Signed)
Order placed for labs.

## 2021-12-26 ENCOUNTER — Encounter: Payer: Self-pay | Admitting: Internal Medicine

## 2021-12-26 DIAGNOSIS — R0789 Other chest pain: Secondary | ICD-10-CM | POA: Insufficient documentation

## 2021-12-26 NOTE — Addendum Note (Signed)
Addended by: Alisa Graff on: 12/26/2021 11:39 AM   Modules accepted: Orders

## 2021-12-26 NOTE — Assessment & Plan Note (Signed)
No acid reflux reported.  Prilosec.  

## 2021-12-26 NOTE — Assessment & Plan Note (Signed)
Colonoscopy 03/2021.  

## 2021-12-26 NOTE — Assessment & Plan Note (Signed)
Per rheumatology - continue amlodipine.  

## 2021-12-26 NOTE — Assessment & Plan Note (Signed)
Followed by rheumatology.  Now seeing Dr Posey Pronto. On low dose amlodipine.  Infection/ulcer - finger.   Seeing Dr Peggye Ley and Dr Posey Pronto.  Treated with clindamycin.  Recommended soaks, etc as outlined.  Call with update.

## 2021-12-26 NOTE — Assessment & Plan Note (Signed)
Increased fatigue.  Start oral B12 supplements.  Treat current infection.  Follow.

## 2021-12-26 NOTE — Assessment & Plan Note (Signed)
Increased stress and anxiety as outlined - related to her medical concerns/issues.  Discussed.  Follow closely.  Previously on zoloft.

## 2021-12-26 NOTE — Assessment & Plan Note (Addendum)
Checked 11/2021 - wnl.  Follow.

## 2021-12-26 NOTE — Assessment & Plan Note (Signed)
Reports noticing some intermittent chest tightness.  Discussed.  Actually feels better when exercising.  Previously saw Dr Corky Sox for palpitations.  Discussed EKG today.  States not having symptoms currently - will hold.  Request f/u with cardiology - to confirm no further w/up/evaluation warranted - given history of scleroderma - question of need for f/u echo.

## 2021-12-27 ENCOUNTER — Encounter: Payer: Self-pay | Admitting: Internal Medicine

## 2021-12-28 ENCOUNTER — Other Ambulatory Visit (INDEPENDENT_AMBULATORY_CARE_PROVIDER_SITE_OTHER): Payer: BC Managed Care – PPO

## 2021-12-28 DIAGNOSIS — R5383 Other fatigue: Secondary | ICD-10-CM | POA: Diagnosis not present

## 2021-12-28 DIAGNOSIS — Z13220 Encounter for screening for lipoid disorders: Secondary | ICD-10-CM

## 2021-12-28 DIAGNOSIS — M349 Systemic sclerosis, unspecified: Secondary | ICD-10-CM

## 2021-12-28 LAB — CBC WITH DIFFERENTIAL/PLATELET
Basophils Absolute: 0 10*3/uL (ref 0.0–0.1)
Basophils Relative: 0.5 % (ref 0.0–3.0)
Eosinophils Absolute: 0.1 10*3/uL (ref 0.0–0.7)
Eosinophils Relative: 2.7 % (ref 0.0–5.0)
HCT: 39.2 % (ref 36.0–46.0)
Hemoglobin: 12.8 g/dL (ref 12.0–15.0)
Lymphocytes Relative: 29.7 % (ref 12.0–46.0)
Lymphs Abs: 1.4 10*3/uL (ref 0.7–4.0)
MCHC: 32.6 g/dL (ref 30.0–36.0)
MCV: 93.4 fl (ref 78.0–100.0)
Monocytes Absolute: 0.4 10*3/uL (ref 0.1–1.0)
Monocytes Relative: 8.5 % (ref 3.0–12.0)
Neutro Abs: 2.8 10*3/uL (ref 1.4–7.7)
Neutrophils Relative %: 58.6 % (ref 43.0–77.0)
Platelets: 195 10*3/uL (ref 150.0–400.0)
RBC: 4.2 Mil/uL (ref 3.87–5.11)
RDW: 14 % (ref 11.5–15.5)
WBC: 4.8 10*3/uL (ref 4.0–10.5)

## 2021-12-28 LAB — LIPID PANEL
Cholesterol: 179 mg/dL (ref 0–200)
HDL: 61.3 mg/dL (ref >39.00)
LDL Cholesterol: 88 mg/dL (ref 0–99)
NonHDL: 117.72
Total CHOL/HDL Ratio: 3
Triglycerides: 147 mg/dL (ref 0.0–149.0)
VLDL: 29.4 mg/dL (ref 0.0–40.0)

## 2021-12-28 LAB — HEPATIC FUNCTION PANEL
ALT: 11 U/L (ref 0–35)
AST: 16 U/L (ref 0–37)
Albumin: 3.9 g/dL (ref 3.5–5.2)
Alkaline Phosphatase: 56 U/L (ref 39–117)
Bilirubin, Direct: 0.1 mg/dL (ref 0.0–0.3)
Total Bilirubin: 0.6 mg/dL (ref 0.2–1.2)
Total Protein: 6.2 g/dL (ref 6.0–8.3)

## 2021-12-28 LAB — BASIC METABOLIC PANEL
BUN: 12 mg/dL (ref 6–23)
CO2: 29 mEq/L (ref 19–32)
Calcium: 9.5 mg/dL (ref 8.4–10.5)
Chloride: 105 mEq/L (ref 96–112)
Creatinine, Ser: 0.85 mg/dL (ref 0.40–1.20)
GFR: 74.65 mL/min (ref >60.00)
Glucose, Bld: 92 mg/dL (ref 70–99)
Potassium: 4.4 mEq/L (ref 3.5–5.1)
Sodium: 140 mEq/L (ref 135–145)

## 2021-12-28 LAB — TSH: TSH: 1.98 u[IU]/mL (ref 0.35–5.50)

## 2021-12-28 NOTE — Telephone Encounter (Signed)
I can see her at 8:00 on 12/30/21 .

## 2021-12-28 NOTE — Telephone Encounter (Signed)
Pt sched for 8am on Friday

## 2021-12-31 ENCOUNTER — Encounter: Payer: Self-pay | Admitting: Internal Medicine

## 2021-12-31 ENCOUNTER — Ambulatory Visit (INDEPENDENT_AMBULATORY_CARE_PROVIDER_SITE_OTHER): Payer: BC Managed Care – PPO | Admitting: Internal Medicine

## 2021-12-31 DIAGNOSIS — R0789 Other chest pain: Secondary | ICD-10-CM

## 2021-12-31 DIAGNOSIS — M349 Systemic sclerosis, unspecified: Secondary | ICD-10-CM

## 2021-12-31 DIAGNOSIS — F419 Anxiety disorder, unspecified: Secondary | ICD-10-CM

## 2021-12-31 DIAGNOSIS — Z86010 Personal history of colonic polyps: Secondary | ICD-10-CM | POA: Diagnosis not present

## 2021-12-31 DIAGNOSIS — K2100 Gastro-esophageal reflux disease with esophagitis, without bleeding: Secondary | ICD-10-CM

## 2021-12-31 DIAGNOSIS — I7300 Raynaud's syndrome without gangrene: Secondary | ICD-10-CM

## 2021-12-31 NOTE — Progress Notes (Unsigned)
Patient ID: Denise Arnold, female   DOB: [birth year], 61 y.o.   MRN: [id number]   Subjective:    Patient ID: Denise Arnold, female    DOB: 10-Aug-1960, 61 y.o.   MRN: [id number]   Patient here for scheduled follow up .   HPI Here to follow up regarding finger ulceration, increased stress and anxiety.  Just saw Dr Cleda Mccreedy for her toe nails. Small cut.  She is following.  Is exercising. Feels better when exercising.  Has scleroderma.  Discussed f/u with cardiology for evaluation and f/u - echocardiograms.  Breathing overall stable.  No increased cough or congestion.  Seeing ortho for her finger.  Finger is better.  Off abx.  Eating.  No abdominal pain or bowels change reported.  Feet not burning.  Discussed stress and anxiety. Does not feel needs any further intervention at this time.    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HCC)    Scleroderma (HCC)    positive FANA, positive SCL-70 abs, raynauds, mild pulmonary hypertension, normal DLCO   Tricuspid regurgitation    Vitamin D deficiency    Past Surgical History:  Procedure Laterality Date   CESAREAN SECTION  1994   COLONOSCOPY WITH PROPOFOL N/A 05/29/2015   Procedure: COLONOSCOPY WITH PROPOFOL;  Surgeon: Manya Silvas, MD;  Location: [facility];  Service: Endoscopy;  Laterality: N/A;   DILATION AND CURETTAGE OF UTERUS  1992   DILATION AND CURETTAGE OF UTERUS  1993   DILATION AND CURETTAGE OF UTERUS  2011   VEIN SURGERY     Family History  Problem Relation Age of Onset   Heart disease Father        myocardial infarction   Arthritis/Rheumatoid Father    Hypertension Father    Hyperlipidemia Mother    Breast cancer Maternal Grandmother    Colon cancer Neg Hx    Social History   Socioeconomic History   Marital status: Married    Spouse name: Not on file   Number of children: 1   Years of  education: Not on file   Highest education level: Not on file  Occupational History   Not on file  Tobacco Use   Smoking status: Never   Smokeless tobacco: Never  Vaping Use   Vaping Use: Never used  Substance and Sexual Activity   Alcohol use: No    Alcohol/week: 0.0 standard drinks of alcohol   Drug use: No   Sexual activity: Not on file  Other Topics Concern   Not on file  Social History Narrative   Not on file   Social Determinants of Health   Financial Resource Strain: Not on file  Food Insecurity: Not on file  Transportation Needs: Not on file  Physical Activity: Not on file  Stress: Not on file  Social Connections: Not on file     Review of Systems  Constitutional:  Negative for appetite change and unexpected weight change.  HENT:  Negative for congestion and sinus pressure.   Respiratory:  Negative for cough, chest tightness and shortness of breath.   Cardiovascular:  Negative for chest pain, palpitations and leg swelling.  Gastrointestinal:  Negative for abdominal pain, diarrhea, nausea and vomiting.  Genitourinary:  Negative for difficulty urinating and dysuria.  Musculoskeletal:  Negative for myalgias.  Skin:  Negative for rash.  Finger ulceration - improved.    Neurological:  Negative for dizziness, light-headedness and headaches.  Psychiatric/Behavioral:  Negative for agitation and dysphoric mood.        Objective:     BP 120/68 (BP Location: Left Arm, Patient Position: Sitting, Cuff Size: Small)   Pulse 72   Temp 98 F (36.7 C) (Temporal)   Resp 15   Ht '5\' 4"'$  (1.626 m)   Wt 158 lb 14.4 oz (72.1 kg)   LMP 02/14/2010   BMI 27.28 kg/m  Wt Readings from Last 3 Encounters:  12/31/21 158 lb 14.4 oz (72.1 kg)  12/21/21 159 lb 6.4 oz (72.3 kg)  10/11/21 161 lb 3.2 oz (73.1 kg)    Physical Exam Vitals reviewed.  Constitutional:      General: She is not in acute distress.    Appearance: Normal appearance.  HENT:     Head: Normocephalic  and atraumatic.     Right Ear: External ear normal.     Left Ear: External ear normal.  Eyes:     General: No scleral icterus.       Right eye: No discharge.        Left eye: No discharge.     Conjunctiva/sclera: Conjunctivae normal.  Neck:     Thyroid: No thyromegaly.  Cardiovascular:     Rate and Rhythm: Normal rate and regular rhythm.  Pulmonary:     Effort: No respiratory distress.     Breath sounds: Normal breath sounds. No wheezing.  Abdominal:     General: Bowel sounds are normal.     Palpations: Abdomen is soft.     Tenderness: There is no abdominal tenderness.  Musculoskeletal:        General: No swelling or tenderness.     Cervical back: Neck supple. No tenderness.  Lymphadenopathy:     Cervical: No cervical adenopathy.  Skin:    Findings: No erythema or rash.     Comments: Left fourth finger - ulceration - improved.  Decreased surrounding erythema.  Not as tender.    Neurological:     Mental Status: She is alert.  Psychiatric:        Mood and Affect: Mood normal.        Behavior: Behavior normal.      Outpatient Encounter Medications as of 12/31/2021  Medication Sig   amLODipine (NORVASC) 10 MG tablet Take 5 mg by mouth daily.    azelastine (ASTELIN) 0.1 % nasal spray PLACE 1 SPRAY INTO BOTH NOSTRILS 2 (TWO) TIMES DAILY. USE IN EACH NOSTRIL AS DIRECTED   Biotin 5000 MCG TABS    Calcium-Magnesium-Vitamin D (CALCIUM 500 PO) Take by mouth.   Cholecalciferol (VITAMIN D) 50 MCG (2000 UT) tablet Take 2,000 Units by mouth daily.   clobetasol cream (TEMOVATE) 0.05 % Apply to elbows twice a day as needed. Avoid face, groin, axilla.   Cranberry 400 MG CAPS Take by mouth.   fish oil-omega-3 fatty acids 1000 MG capsule Take 1 g by mouth daily.   glucosamine-chondroitin 500-400 MG tablet Take 1 tablet by mouth daily.   MISC NATURAL PRODUCTS PO Herbal Supplement   Multiple Vitamin (MULTIVITAMIN) tablet Take 1 tablet by mouth daily.   mupirocin ointment (BACTROBAN) 2 %  Apply topically daily as needed.   nitroGLYCERIN (NITROGLYN) 2 % ointment Apply 0.5 inches topically 3 (three) times daily as needed.    omeprazole (PRILOSEC) 20 MG capsule Take 20 mg by mouth 2 (two) times daily.    Probiotic Product (  PROBIOTIC PO) Take by mouth.   Tavaborole (KERYDIN) 5 % SOLN Apply to affected toenail every night until improved.   No facility-administered encounter medications on file as of 12/31/2021.     Lab Results  Component Value Date   WBC 4.8 12/28/2021   HGB 12.8 12/28/2021   HCT 39.2 12/28/2021   PLT 195.0 12/28/2021   GLUCOSE 92 12/28/2021   CHOL 179 12/28/2021   TRIG 147.0 12/28/2021   HDL 61.30 12/28/2021   LDLCALC 88 12/28/2021   ALT 11 12/28/2021   AST 16 12/28/2021   NA 140 12/28/2021   K 4.4 12/28/2021   CL 105 12/28/2021   CREATININE 0.85 12/28/2021   BUN 12 12/28/2021   CO2 29 12/28/2021   TSH 1.98 12/28/2021       Assessment & Plan:   Problem List Items Addressed This Visit     Anxiety    Has had issues with increased stress and anxiety as outlined - related to her medical concerns/issues.  Discussed.  Follow closely.  Previously on zoloft. Does not feel needs any further intervention.       Chest tightness    Previously noticed some intermittent chest tightness as outlined last note.  Feels better when exercising.  Previously saw Dr Corky Sox for palpitations.  Last visit discussed f/u with cardiology - to confirm no further w/up/evaluation warranted - given history of scleroderma - question of need for f/u echo.  Plans to f/u with cardiology as outlined.        GERD (gastroesophageal reflux disease)    No acid reflux reported.  Prilosec.       History of colonic polyps    Colonoscopy 03/2021.       Raynauds phenomenon    Per rheumatology - continue amlodipine.       Scleroderma ([place])    Followed by rheumatology.  On low dose amlodipine.  Infection/ulcer - finger.   Seeing Dr Peggye Ley and Dr Posey Pronto.  Treated with clindamycin.   Recommended soaks, etc as outlined.  Finger doing better.  No change.  Follow.            Einar Pheasant, MD

## 2022-01-02 ENCOUNTER — Encounter: Payer: Self-pay | Admitting: Internal Medicine

## 2022-01-02 NOTE — Assessment & Plan Note (Signed)
Previously noticed some intermittent chest tightness as outlined last note.  Feels better when exercising.  Previously saw Dr Corky Sox for palpitations.  Last visit discussed f/u with cardiology - to confirm no further w/up/evaluation warranted - given history of scleroderma - question of need for f/u echo.  Plans to f/u with cardiology as outlined.

## 2022-01-02 NOTE — Assessment & Plan Note (Signed)
Per rheumatology - continue amlodipine.  

## 2022-01-02 NOTE — Assessment & Plan Note (Signed)
Colonoscopy 03/2021.  

## 2022-01-02 NOTE — Assessment & Plan Note (Signed)
Has had issues with increased stress and anxiety as outlined - related to her medical concerns/issues.  Discussed.  Follow closely.  Previously on zoloft. Does not feel needs any further intervention.  

## 2022-01-02 NOTE — Assessment & Plan Note (Signed)
No acid reflux reported.  Prilosec.  

## 2022-01-02 NOTE — Assessment & Plan Note (Signed)
Followed by rheumatology.  On low dose amlodipine.  Infection/ulcer - finger.   Seeing Dr Peggye Ley and Dr Posey Pronto.  Treated with clindamycin.  Recommended soaks, etc as outlined.  Finger doing better.  No change.  Follow.

## 2022-01-11 ENCOUNTER — Encounter: Payer: Self-pay | Admitting: Internal Medicine

## 2022-01-11 ENCOUNTER — Ambulatory Visit (INDEPENDENT_AMBULATORY_CARE_PROVIDER_SITE_OTHER): Payer: BC Managed Care – PPO | Admitting: Internal Medicine

## 2022-01-11 VITALS — BP 128/78 | HR 83 | Temp 98.3°F | Ht 64.0 in | Wt 163.4 lb

## 2022-01-11 DIAGNOSIS — K2100 Gastro-esophageal reflux disease with esophagitis, without bleeding: Secondary | ICD-10-CM | POA: Diagnosis not present

## 2022-01-11 DIAGNOSIS — N644 Mastodynia: Secondary | ICD-10-CM

## 2022-01-11 DIAGNOSIS — F419 Anxiety disorder, unspecified: Secondary | ICD-10-CM | POA: Diagnosis not present

## 2022-01-11 DIAGNOSIS — Z86010 Personal history of colonic polyps: Secondary | ICD-10-CM | POA: Diagnosis not present

## 2022-01-11 DIAGNOSIS — I7300 Raynaud's syndrome without gangrene: Secondary | ICD-10-CM

## 2022-01-11 DIAGNOSIS — L98491 Non-pressure chronic ulcer of skin of other sites limited to breakdown of skin: Secondary | ICD-10-CM

## 2022-01-11 DIAGNOSIS — M349 Systemic sclerosis, unspecified: Secondary | ICD-10-CM

## 2022-01-11 NOTE — Progress Notes (Signed)
Patient ID: Denise Arnold, female   DOB: Sep 24, 1960, 61 y.o.   MRN: [id number]   Subjective:    Patient ID: Denise Arnold, female    DOB: 04-29-1961, 61 y.o.   MRN: [id number]   Patient here for a scheduled follow up.   Chief Complaint  Patient presents with   Follow-up    3 month follow up   .   HPI Here to follow up regarding increased stress and scleroderma (finger ulcer).  Saw dermatology today.  Is doing better.  Restarted doxycycline.  No chest pain or sob reported.  No abdominal pain.  Bowels moving.  Blood pressure doing well.  Left breast tenderness.     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HCC)    Scleroderma (HCC)    positive FANA, positive SCL-70 abs, raynauds, mild pulmonary hypertension, normal DLCO   Tricuspid regurgitation    Vitamin D deficiency    Past Surgical History:  Procedure Laterality Date   CESAREAN SECTION  1994   COLONOSCOPY WITH PROPOFOL N/A 05/29/2015   Procedure: COLONOSCOPY WITH PROPOFOL;  Surgeon: Manya Silvas, MD;  Location: [facility];  Service: Endoscopy;  Laterality: N/A;   DILATION AND CURETTAGE OF UTERUS  1992   DILATION AND CURETTAGE OF UTERUS  1993   DILATION AND CURETTAGE OF UTERUS  2011   VEIN SURGERY     Family History  Problem Relation Age of Onset   Heart disease Father        myocardial infarction   Arthritis/Rheumatoid Father    Hypertension Father    Hyperlipidemia Mother    Breast cancer Maternal Grandmother    Colon cancer Neg Hx    Social History   Socioeconomic History   Marital status: Married    Spouse name: Not on file   Number of children: 1   Years of education: Not on file   Highest education level: Not on file  Occupational History   Not on file  Tobacco Use   Smoking status: Never   Smokeless tobacco: Never  Vaping Use   Vaping Use: Never used  Substance  and Sexual Activity   Alcohol use: No    Alcohol/week: 0.0 standard drinks of alcohol   Drug use: No   Sexual activity: Not on file  Other Topics Concern   Not on file  Social History Narrative   Not on file   Social Determinants of Health   Financial Resource Strain: Not on file  Food Insecurity: Not on file  Transportation Needs: Not on file  Physical Activity: Not on file  Stress: Not on file  Social Connections: Not on file     Review of Systems  Constitutional:  Negative for appetite change and unexpected weight change.  HENT:  Negative for congestion and sinus pressure.   Respiratory:  Negative for cough, chest tightness and shortness of breath.   Cardiovascular:  Negative for chest pain, palpitations and leg swelling.  Gastrointestinal:  Negative for abdominal pain, diarrhea, nausea and vomiting.  Genitourinary:  Negative for difficulty urinating and dysuria.  Musculoskeletal:  Negative for joint swelling and myalgias.  Skin:  Negative for rash.       Ulceration - finger.   Neurological:  Negative for dizziness, light-headedness and headaches.  Psychiatric/Behavioral:  Negative for dysphoric mood.  Increased stress.  Appears to be doing some better.        Objective:     BP 128/78 (BP Location: Left Arm, Patient Position: Sitting, Cuff Size: Normal)   Pulse 83   Temp 98.3 F (36.8 C) (Oral)   Ht '5\' 4"'$  (1.626 m)   Wt 163 lb 6.4 oz (74.1 kg)   LMP 02/14/2010   SpO2 97%   BMI 28.05 kg/m  Wt Readings from Last 3 Encounters:  01/11/22 163 lb 6.4 oz (74.1 kg)  12/31/21 158 lb 14.4 oz (72.1 kg)  12/21/21 159 lb 6.4 oz (72.3 kg)    Physical Exam Vitals reviewed.  Constitutional:      General: She is not in acute distress.    Appearance: Normal appearance.  HENT:     Head: Normocephalic and atraumatic.     Right Ear: External ear normal.     Left Ear: External ear normal.  Eyes:     General: No scleral icterus.       Right eye: No discharge.         Left eye: No discharge.     Conjunctiva/sclera: Conjunctivae normal.  Neck:     Thyroid: No thyromegaly.  Cardiovascular:     Rate and Rhythm: Normal rate and regular rhythm.  Pulmonary:     Effort: No respiratory distress.     Breath sounds: Normal breath sounds. No wheezing.     Comments: Breasts:  left breast tenderness to palpation - 2-3:00 outer edge of breast into axilla. No palpable nodules or axillary adenopathy. No nipple retraction or nipple discharge present.  Abdominal:     General: Bowel sounds are normal.     Palpations: Abdomen is soft.     Tenderness: There is no abdominal tenderness.  Musculoskeletal:        General: No swelling or tenderness.     Cervical back: Neck supple. No tenderness.  Lymphadenopathy:     Cervical: No cervical adenopathy.  Skin:    Findings: No erythema or rash.     Comments: Ulceration - finger tip.   Neurological:     Mental Status: She is alert.  Psychiatric:        Mood and Affect: Mood normal.        Behavior: Behavior normal.      Outpatient Encounter Medications as of 01/11/2022  Medication Sig   amLODipine (NORVASC) 10 MG tablet Take 5 mg by mouth daily.    azelastine (ASTELIN) 0.1 % nasal spray PLACE 1 SPRAY INTO BOTH NOSTRILS 2 (TWO) TIMES DAILY. USE IN EACH NOSTRIL AS DIRECTED   Biotin 5000 MCG TABS    Calcium-Magnesium-Vitamin D (CALCIUM 500 PO) Take by mouth.   Cholecalciferol (VITAMIN D) 50 MCG (2000 UT) tablet Take 2,000 Units by mouth daily.   clobetasol cream (TEMOVATE) 0.05 % Apply to elbows twice a day as needed. Avoid face, groin, axilla.   Cranberry 400 MG CAPS Take by mouth.   fish oil-omega-3 fatty acids 1000 MG capsule Take 1 g by mouth daily.   glucosamine-chondroitin 500-400 MG tablet Take 1 tablet by mouth daily.   MISC NATURAL PRODUCTS PO Herbal Supplement   Multiple Vitamin (MULTIVITAMIN) tablet Take 1 tablet by mouth daily.   mupirocin ointment (BACTROBAN) 2 % Apply topically daily as needed.    nitroGLYCERIN (NITROGLYN) 2 % ointment Apply 0.5 inches topically 3 (three) times daily as needed.    omeprazole (PRILOSEC) 20 MG capsule Take 20 mg by mouth 2 (two) times  daily.    Probiotic Product (PROBIOTIC PO) Take by mouth.   Tavaborole (KERYDIN) 5 % SOLN Apply to affected toenail every night until improved.   No facility-administered encounter medications on file as of 01/11/2022.     Lab Results  Component Value Date   WBC 4.8 12/28/2021   HGB 12.8 12/28/2021   HCT 39.2 12/28/2021   PLT 195.0 12/28/2021   GLUCOSE 92 12/28/2021   CHOL 179 12/28/2021   TRIG 147.0 12/28/2021   HDL 61.30 12/28/2021   LDLCALC 88 12/28/2021   ALT 11 12/28/2021   AST 16 12/28/2021   NA 140 12/28/2021   K 4.4 12/28/2021   CL 105 12/28/2021   CREATININE 0.85 12/28/2021   BUN 12 12/28/2021   CO2 29 12/28/2021   TSH 1.98 12/28/2021       Assessment & Plan:   Problem List Items Addressed This Visit     Anxiety    Has had issues with increased stress and anxiety as outlined - related to her medical concerns/issues.  Discussed.  Follow closely.  Previously on zoloft. Does not feel needs any further intervention.       Breast pain, left - Primary    Due screening mammogram.  Change to diagnostic mammogram with ultrasound left breast.        Relevant Orders   MM DIAG BREAST TOMO BILATERAL   US BREAST LTD UNI LEFT INC AXILLA   Finger ulcer ([place])    Saw dermatology today.  Placed back on doxycycline.  Discussed taking probiotics.  Follow.       GERD (gastroesophageal reflux disease)    No acid reflux reported.  Prilosec.       History of colonic polyps    Colonoscopy 03/2021.       Raynauds phenomenon    Per rheumatology - continue amlodipine.       Scleroderma ([place])    Followed by rheumatology.  Now seeing Dr Posey Pronto. On low dose amlodipine.  Infection/ulcer - finger.   Seeing Dr Peggye Ley and Dr Posey Pronto.  Treated with clindamycin.  Recommended soaks.  Recheck today.  On doxycycline now.  Call with update.            Einar Pheasant, MD

## 2022-01-16 ENCOUNTER — Encounter: Payer: Self-pay | Admitting: Internal Medicine

## 2022-01-16 DIAGNOSIS — N644 Mastodynia: Secondary | ICD-10-CM | POA: Insufficient documentation

## 2022-01-16 DIAGNOSIS — L98499 Non-pressure chronic ulcer of skin of other sites with unspecified severity: Secondary | ICD-10-CM | POA: Insufficient documentation

## 2022-01-16 NOTE — Assessment & Plan Note (Signed)
Per rheumatology - continue amlodipine.

## 2022-01-16 NOTE — Assessment & Plan Note (Signed)
Colonoscopy 03/2021.  

## 2022-01-16 NOTE — Assessment & Plan Note (Signed)
Followed by rheumatology.  Now seeing Dr Posey Pronto. On low dose amlodipine.  Infection/ulcer - finger.   Seeing Dr Peggye Ley and Dr Posey Pronto.  Treated with clindamycin.  Recommended soaks.  Recheck today.  On doxycycline now. Call with update.

## 2022-01-16 NOTE — Assessment & Plan Note (Signed)
No acid reflux reported.  Prilosec.  

## 2022-01-16 NOTE — Assessment & Plan Note (Signed)
Due screening mammogram.  Change to diagnostic mammogram with ultrasound left breast.

## 2022-01-16 NOTE — Assessment & Plan Note (Signed)
Saw dermatology today.  Placed back on doxycycline.  Discussed taking probiotics.  Follow.

## 2022-01-16 NOTE — Assessment & Plan Note (Signed)
Has had issues with increased stress and anxiety as outlined - related to her medical concerns/issues.  Discussed.  Follow closely.  Previously on zoloft. Does not feel needs any further intervention.

## 2022-01-31 ENCOUNTER — Ambulatory Visit
Admission: RE | Admit: 2022-01-31 | Discharge: 2022-01-31 | Disposition: A | Discharge status: Home / Self Care | Payer: BC Managed Care – PPO | Source: Ambulatory Visit | Attending: Internal Medicine | Admitting: Internal Medicine

## 2022-01-31 DIAGNOSIS — N644 Mastodynia: Secondary | ICD-10-CM | POA: Insufficient documentation

## 2022-03-02 ENCOUNTER — Ambulatory Visit: Payer: BC Managed Care – PPO | Admitting: Internal Medicine

## 2022-03-17 ENCOUNTER — Ambulatory Visit (INDEPENDENT_AMBULATORY_CARE_PROVIDER_SITE_OTHER): Payer: BC Managed Care – PPO | Admitting: Internal Medicine

## 2022-03-17 ENCOUNTER — Encounter: Payer: Self-pay | Admitting: Internal Medicine

## 2022-03-17 VITALS — BP 110/70 | HR 76 | Temp 98.2°F | Resp 14 | Ht 64.0 in | Wt 162.0 lb

## 2022-03-17 DIAGNOSIS — K2100 Gastro-esophageal reflux disease with esophagitis, without bleeding: Secondary | ICD-10-CM

## 2022-03-17 DIAGNOSIS — R002 Palpitations: Secondary | ICD-10-CM | POA: Diagnosis not present

## 2022-03-17 DIAGNOSIS — M349 Systemic sclerosis, unspecified: Secondary | ICD-10-CM | POA: Diagnosis not present

## 2022-03-17 DIAGNOSIS — J329 Chronic sinusitis, unspecified: Secondary | ICD-10-CM

## 2022-03-17 DIAGNOSIS — Z13220 Encounter for screening for lipoid disorders: Secondary | ICD-10-CM | POA: Diagnosis not present

## 2022-03-17 DIAGNOSIS — F419 Anxiety disorder, unspecified: Secondary | ICD-10-CM

## 2022-03-17 DIAGNOSIS — Z86010 Personal history of colonic polyps: Secondary | ICD-10-CM

## 2022-03-17 MED ORDER — DOXYCYCLINE HYCLATE 100 MG PO TABS: 100.0000 mg | ORAL_TABLET | Freq: Two times a day (BID) | ORAL | 0 refills | Status: DC

## 2022-03-17 NOTE — Patient Instructions (Signed)
Saline nasal spray - flush nose 2x/day  Flonase - 2 sprays each nostril one time per day.  Do this in the evening.   Robitussin (plain) - twice a day as needed.    Doxycycline twice a day  Take the probiotic as we discussed.

## 2022-03-17 NOTE — Progress Notes (Signed)
Patient ID: Denise Arnold, female   DOB: [birth year], 61 y.o.   MRN: [id number]   Subjective:    Patient ID: Denise Arnold, female    DOB: [birth year], 61 y.o.   MRN: [id number]   Patient here for  Chief Complaint  Patient presents with   Follow-up    4 wks. Pt c/o sinus issues, HA & pressure x1 wk. Pt tested negative for covid '@home'$  x6. Pt's husband did have covid last wk.    Marland Kitchen   HPI Followed by rheumatology for f/u scleroderma.  Now taking sildenafill.  Off amlodipine.  Finger has healed.  Does report over the last week - some increased sinus congestion.  No sore throat.  No chest congestion or sob.  Persistent head congestoin.  Taking sudafed.  Husband tested positive for covid last week.  She has taken multiple covid tests - all negative.  She has added zinc and vitamin C.  Some fatigue.     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HCC)    Scleroderma (HCC)    positive FANA, positive SCL-70 abs, raynauds, mild pulmonary hypertension, normal DLCO   Tricuspid regurgitation    Vitamin D deficiency    Past Surgical History:  Procedure Laterality Date   CESAREAN SECTION  1994   COLONOSCOPY WITH PROPOFOL N/A 05/29/2015   Procedure: COLONOSCOPY WITH PROPOFOL;  Surgeon: Manya Silvas, MD;  Location: [facility];  Service: Endoscopy;  Laterality: N/A;   DILATION AND CURETTAGE OF UTERUS  1992   DILATION AND CURETTAGE OF UTERUS  1993   DILATION AND CURETTAGE OF UTERUS  2011   VEIN SURGERY     Family History  Problem Relation Age of Onset   Heart disease Father        myocardial infarction   Arthritis/Rheumatoid Father    Hypertension Father    Hyperlipidemia Mother    Breast cancer Maternal Grandmother    Colon cancer Neg Hx    Social History   Socioeconomic History   Marital status: Married    Spouse name: Not on file   Number of children:  1   Years of education: Not on file   Highest education level: Not on file  Occupational History   Not on file  Tobacco Use   Smoking status: Never   Smokeless tobacco: Never  Vaping Use   Vaping Use: Never used  Substance and Sexual Activity   Alcohol use: No    Alcohol/week: 0.0 standard drinks of alcohol   Drug use: No   Sexual activity: Not on file  Other Topics Concern   Not on file  Social History Narrative   Not on file   Social Determinants of Health   Financial Resource Strain: Not on file  Food Insecurity: Not on file  Transportation Needs: Not on file  Physical Activity: Not on file  Stress: Not on file  Social Connections: Not on file     Review of Systems  Constitutional:  Positive for fatigue. Negative for appetite change and unexpected weight change.  HENT:  Positive for congestion. Negative for sore throat.   Respiratory:  Negative for cough, chest tightness and shortness of breath.   Cardiovascular:  Negative for chest pain, palpitations and leg swelling.  Gastrointestinal:  Negative for abdominal pain, diarrhea, nausea and vomiting.  Genitourinary:  Negative  for difficulty urinating and dysuria.  Musculoskeletal:  Negative for joint swelling and myalgias.  Skin:  Negative for color change and rash.  Neurological:  Negative for dizziness, light-headedness and headaches.  Psychiatric/Behavioral:  Negative for agitation and dysphoric mood.        Objective:     BP 110/70 (BP Location: Left Arm, Patient Position: Sitting, Cuff Size: Normal)   Pulse 76   Temp 98.2 F (36.8 C) (Oral)   Resp 14   Ht '5\' 4"'$  (1.626 m)   Wt 162 lb (73.5 kg)   LMP 02/14/2010   SpO2 96%   BMI 27.81 kg/m  Wt Readings from Last 3 Encounters:  03/17/22 162 lb (73.5 kg)  01/11/22 163 lb 6.4 oz (74.1 kg)  12/31/21 158 lb 14.4 oz (72.1 kg)    Physical Exam Vitals reviewed.  Constitutional:      General: She is not in acute distress.    Appearance: Normal appearance.   HENT:     Head: Normocephalic and atraumatic.     Right Ear: External ear normal.     Left Ear: External ear normal.  Eyes:     General: No scleral icterus.       Right eye: No discharge.        Left eye: No discharge.     Conjunctiva/sclera: Conjunctivae normal.  Neck:     Thyroid: No thyromegaly.  Cardiovascular:     Rate and Rhythm: Normal rate and regular rhythm.  Pulmonary:     Effort: No respiratory distress.     Breath sounds: Normal breath sounds. No wheezing.  Abdominal:     General: Bowel sounds are normal.     Palpations: Abdomen is soft.     Tenderness: There is no abdominal tenderness.  Musculoskeletal:        General: No swelling or tenderness.     Cervical back: Neck supple. No tenderness.  Lymphadenopathy:     Cervical: No cervical adenopathy.  Skin:    Findings: No erythema or rash.  Neurological:     Mental Status: She is alert.  Psychiatric:        Mood and Affect: Mood normal.        Behavior: Behavior normal.      Outpatient Encounter Medications as of 03/17/2022  Medication Sig   azelastine (ASTELIN) 0.1 % nasal spray PLACE 1 SPRAY INTO BOTH NOSTRILS 2 (TWO) TIMES DAILY. USE IN EACH NOSTRIL AS DIRECTED   Biotin 5000 MCG TABS    Calcium-Magnesium-Vitamin D (CALCIUM 500 PO) Take by mouth.   Cholecalciferol (VITAMIN D) 50 MCG (2000 UT) tablet Take 2,000 Units by mouth daily.   clobetasol cream (TEMOVATE) 0.05 % Apply to elbows twice a day as needed. Avoid face, groin, axilla.   Cranberry 400 MG CAPS Take by mouth.   doxycycline (VIBRA-TABS) 100 MG tablet Take 1 tablet (100 mg total) by mouth 2 (two) times daily.   fish oil-omega-3 fatty acids 1000 MG capsule Take 1 g by mouth daily.   MISC NATURAL PRODUCTS PO Herbal Supplement   Multiple Vitamin (MULTIVITAMIN) tablet Take 1 tablet by mouth daily.   mupirocin ointment (BACTROBAN) 2 % Apply topically daily as needed.   nitroGLYCERIN (NITROGLYN) 2 % ointment Apply 0.5 inches topically 3 (three) times  daily as needed.    omeprazole (PRILOSEC) 20 MG capsule Take 20 mg by mouth 2 (two) times daily.    Probiotic Product (PROBIOTIC PO) Take by mouth.   sildenafil (REVATIO) 20 MG tablet  SMARTSIG:0.5 Tablet(s) By Mouth 3 Times Daily   Tavaborole (KERYDIN) 5 % SOLN Apply to affected toenail every night until improved.   [DISCONTINUED] amLODipine (NORVASC) 10 MG tablet Take 5 mg by mouth daily.    [DISCONTINUED] glucosamine-chondroitin 500-400 MG tablet Take 1 tablet by mouth daily.   No facility-administered encounter medications on file as of 03/17/2022.     Lab Results  Component Value Date   WBC 4.8 12/28/2021   HGB 12.8 12/28/2021   HCT 39.2 12/28/2021   PLT 195.0 12/28/2021   GLUCOSE 92 12/28/2021   CHOL 179 12/28/2021   TRIG 147.0 12/28/2021   HDL 61.30 12/28/2021   LDLCALC 88 12/28/2021   ALT 11 12/28/2021   AST 16 12/28/2021   NA 140 12/28/2021   K 4.4 12/28/2021   CL 105 12/28/2021   CREATININE 0.85 12/28/2021   BUN 12 12/28/2021   CO2 29 12/28/2021   TSH 1.98 12/28/2021    MM DIAG BREAST TOMO BILATERAL  Result Date: 01/31/2022 CLINICAL DATA:  Patient describes intermittent generalized pain within the upper-outer quadrant of the LEFT breast and axillary tail region. EXAM: DIGITAL DIAGNOSTIC BILATERAL MAMMOGRAM WITH TOMOSYNTHESIS AND CAD TECHNIQUE: Bilateral digital diagnostic mammography and breast tomosynthesis was performed. The images were evaluated with computer-aided detection. COMPARISON:  Previous exam(s). ACR Breast Density Category b: There are scattered areas of fibroglandular density. FINDINGS: There are no new dominant masses, suspicious calcifications or secondary signs of malignancy within either breast. Specifically, there is no new mammographic finding within the upper-outer quadrant of the LEFT breast corresponding to the area of patient's clinical concern. IMPRESSION: No evidence of malignancy within either breast. RECOMMENDATION: 1.  Screening mammogram in  one year.(Code:SM-B-01Y) 2. Breast pain is a common condition which will often resolve on its own without intervention. Benign causes of breast pain were discussed with the patient. The patient was encouraged to follow-up with referring physician if the pain persisted or worsened as this might indicate a need to evaluate the deeper structures of the underlying chest wall. Patient was instructed to return for additional imaging if a palpable lump developed. I have discussed the findings and recommendations with the patient. If applicable, a reminder letter will be sent to the patient regarding the next appointment. BI-RADS CATEGORY  1: Negative. Electronically Signed   By: Franki Cabot M.D.   On: 01/31/2022 14:32       Assessment & Plan:   Problem List Items Addressed This Visit     Anxiety    Overall appears to be handling things relatively well.  Follow.       GERD (gastroesophageal reflux disease)    No acid reflux reported.  Prilosec.       History of colonic polyps    Colonoscopy 03/2021.       Palpitations    Doing better.  Saw cardiology.  Stable.       Scleroderma ([place])    Followed by rheumatology.  Now seeing Dr Posey Pronto. Off amlodipine.  Infection/ulcer - finger - healed. Now on sildenafill.  Stable       Relevant Orders   Basic metabolic panel   Hepatic function panel   Sinusitis    Treat with doxycycline.  Continue saline nasal spray, steroid nasal spray.  Follow.  If persistent symptoms, consider trial of prednisone.  Follow.       Relevant Medications   doxycycline (VIBRA-TABS) 100 MG tablet   Other Visit Diagnoses     Screening cholesterol level    -  Primary   Relevant Orders   Lipid panel        Einar Pheasant, MD

## 2022-03-21 ENCOUNTER — Encounter: Payer: Self-pay | Admitting: Internal Medicine

## 2022-03-22 MED ORDER — PREDNISONE 10 MG PO TABS: ORAL_TABLET | ORAL | 0 refills | Status: DC

## 2022-03-22 NOTE — Telephone Encounter (Signed)
Rx sent in for prednisone taper.

## 2022-03-27 ENCOUNTER — Encounter: Payer: Self-pay | Admitting: Internal Medicine

## 2022-03-27 DIAGNOSIS — J329 Chronic sinusitis, unspecified: Secondary | ICD-10-CM | POA: Insufficient documentation

## 2022-03-27 NOTE — Assessment & Plan Note (Signed)
Overall appears to be handling things relatively well.  Follow.   

## 2022-03-27 NOTE — Assessment & Plan Note (Signed)
Colonoscopy 03/2021.  

## 2022-03-27 NOTE — Assessment & Plan Note (Signed)
Doing better.  Saw cardiology.  Stable.

## 2022-03-27 NOTE — Assessment & Plan Note (Signed)
Treat with doxycycline.  Continue saline nasal spray, steroid nasal spray.  Follow.  If persistent symptoms, consider trial of prednisone.  Follow.

## 2022-03-27 NOTE — Assessment & Plan Note (Signed)
Followed by rheumatology.  Now seeing Dr Posey Pronto. Off amlodipine.  Infection/ulcer - finger - healed. Now on sildenafill.  Stable

## 2022-03-27 NOTE — Assessment & Plan Note (Signed)
No acid reflux reported.  Prilosec.  

## 2022-03-30 ENCOUNTER — Ambulatory Visit (INDEPENDENT_AMBULATORY_CARE_PROVIDER_SITE_OTHER): Payer: BC Managed Care – PPO | Admitting: Dermatology

## 2022-03-30 DIAGNOSIS — B351 Tinea unguium: Secondary | ICD-10-CM | POA: Diagnosis not present

## 2022-03-30 DIAGNOSIS — L98499 Non-pressure chronic ulcer of skin of other sites with unspecified severity: Secondary | ICD-10-CM

## 2022-03-30 DIAGNOSIS — I7300 Raynaud's syndrome without gangrene: Secondary | ICD-10-CM | POA: Diagnosis not present

## 2022-03-30 DIAGNOSIS — M349 Systemic sclerosis, unspecified: Secondary | ICD-10-CM | POA: Diagnosis not present

## 2022-03-30 MED ORDER — TACROLIMUS 0.1 % EX OINT: TOPICAL_OINTMENT | CUTANEOUS | 1 refills | Status: DC

## 2022-03-30 NOTE — Patient Instructions (Addendum)
Continue Clobetasol Cream once to twice a day for up to 2 weeks as needed with increasing symptoms. Avoid face, groin, underarms. Start tacrolimus ointment - apply once to twice a day to affected areas. Start Stratamed - apply up to twice a day for symptoms. May start Voltaren Gel to use with increased symptoms.  Due to recent changes in healthcare laws, you may see results of your pathology and/or laboratory studies on MyChart before the doctors have had a chance to review them. We understand that in some cases there may be results that are confusing or concerning to you. Please understand that not all results are received at the same time and often the doctors may need to interpret multiple results in order to provide you with the best plan of care or course of treatment. Therefore, we ask that you please give [place] 2 business days to thoroughly review all your results before contacting the office for clarification. Should we see a critical lab result, you will be contacted sooner.   If You Need Anything After Your Visit  If you have any questions or concerns for your doctor, please call our main line at [phone] and press option 4 to reach your doctor's medical assistant. If no one answers, please leave a voicemail as directed and we will return your call as soon as possible. Messages left after 4 pm will be answered the following business day.   You may also send [place] a message via [place]. We typically respond to MyChart messages within 1-2 business days.  For prescription refills, please ask your pharmacy to contact our office. Our fax number is [phone].  If you have an urgent issue when the clinic is closed that cannot wait until the next business day, you can [place] your doctor at the number below.    Please note that while we do our best to be available for urgent issues outside of office hours, we are not available 24/7.   If you have an urgent issue and are unable to reach [place], you  may choose to seek medical care at your doctor's office, retail clinic, urgent care center, or emergency room.  If you have a medical emergency, please immediately call 911 or go to the emergency department.  Pager Numbers  - Dr. Nehemiah Massed: [phone]  - Dr. Laurence Ferrari: [phone]  - Dr. Nicole Kindred: [phone]  In the event of inclement weather, please call our main line at [phone] for an update on the status of any delays or closures.  Dermatology Medication Tips: Please keep the boxes that topical medications come in in order to help keep track of the instructions about where and how to use these. Pharmacies typically print the medication instructions only on the boxes and not directly on the medication tubes.   If your medication is too expensive, please contact our office at [phone] option 4 or send [place] a message through [place].   We are unable to tell what your co-pay for medications will be in advance as this is different depending on your insurance coverage. However, we may be able to find a substitute medication at lower cost or fill out paperwork to get insurance to cover a needed medication.   If a prior authorization is required to get your medication covered by your insurance company, please allow [place] 1-2 business days to complete this process.  Drug prices often vary depending on where the prescription is filled and some pharmacies may offer cheaper prices.  The website www.goodrx.com contains  coupons for medications through different pharmacies. The prices here do not account for what the cost may be with help from insurance (it may be cheaper with your insurance), but the website can give you the price if you did not use any insurance.  - You can print the associated coupon and take it with your prescription to the pharmacy.  - You may also stop by our office during regular business hours and pick up a GoodRx coupon card.  - If you need your prescription sent  electronically to a different pharmacy, notify our office through [facility] or by phone at [phone] option 4.     Si Usted Necesita Algo Despus de Su Visita  Tambin puede enviarnos un mensaje a travs de Pharmacist, community. Por lo general respondemos a los mensajes de MyChart en el transcurso de 1 a 2 das hbiles.  Para renovar recetas, por favor pida a su farmacia que se ponga en contacto con nuestra oficina. Harland Dingwall de fax es [place] [phone].  Si tiene un asunto urgente cuando la clnica est cerrada y que no puede esperar hasta el siguiente da hbil, puede llamar/localizar a su doctor(a) al nmero que aparece a continuacin.   Por favor, tenga en cuenta que aunque hacemos todo lo posible para estar disponibles para asuntos urgentes fuera del horario de [place], no estamos disponibles las 24 horas del da, los 7 das de la [place].   Si tiene un problema urgente y no puede comunicarse con nosotros, puede optar por buscar atencin mdica  en el consultorio de su doctor(a), en una clnica privada, en un centro de atencin urgente o en una sala de emergencias.  Si tiene Engineering geologist, por favor llame inmediatamente al 911 o vaya a la sala de emergencias.  Nmeros de bper  - Dr. Nehemiah Massed: [phone]  - Dra. Moye: [phone]  - Dra. Nicole Kindred: [phone]  En caso de inclemencias del [place], por favor llame a Johnsie Kindred principal al [phone] para una actualizacin sobre el [place] de cualquier retraso o cierre.  Consejos para la medicacin en dermatologa: Por favor, guarde las cajas en las que vienen los medicamentos de uso tpico para ayudarle a seguir las instrucciones sobre dnde y cmo usarlos. Las farmacias generalmente imprimen las instrucciones del medicamento slo en las cajas y no directamente en los tubos del [place].   Si su medicamento es muy caro, por favor, pngase en contacto con Zigmund Daniel llamando al [phone] y presione la  opcin 4 o envenos un mensaje a travs de Pharmacist, community.   No podemos decirle cul ser su copago por los medicamentos por adelantado ya que esto es diferente dependiendo de la cobertura de su seguro. Sin embargo, es posible que podamos encontrar un medicamento sustituto a Electrical engineer un formulario para que el seguro cubra el medicamento que se considera necesario.   Si se requiere una autorizacin previa para que su compaa de seguros [place] su medicamento, por favor permtanos de 1 a 2 das hbiles para completar este proceso.  Los precios de los medicamentos varan con frecuencia dependiendo del Environmental consultant de dnde se surte la receta y alguna farmacias pueden ofrecer precios ms baratos.  El sitio web www.goodrx.com tiene cupones para medicamentos de Airline pilot. Los precios aqu no tienen en cuenta lo que podra costar con la ayuda del seguro (puede ser ms barato con su seguro), pero el sitio web puede darle el precio si no utiliz Research scientist (physical sciences).  - Puede imprimir el cupn  correspondiente y llevarlo con su receta a la farmacia.  - Tambin puede pasar por nuestra oficina durante el horario de atencin regular y Charity fundraiser una tarjeta de cupones de GoodRx.  - Si necesita que su receta se enve electrnicamente a una farmacia diferente, informe a nuestra oficina a travs de MyChart de  o por telfono llamando al [phone] y presione la opcin 4.

## 2022-03-30 NOTE — Progress Notes (Signed)
   Follow-Up Visit   Subjective  Denise Arnold is a 61 y.o. female who presents for the following: Follow-up.  Patient presents for follow-up Dermatitis vs Scleroderma of the bilateral elbows. She has tried Clobetasol Cream and Mupirocin Ointment. Patient with Raynaud's, changed from Norvasc to Sildenafil. She has a new sore on L elbow.  Also some redness and burning on tops of feet.  Also recheck right great toenail, patient is using Tavaborole solution for tinea unguium.   The following portions of the chart were reviewed this encounter and updated as appropriate:       Review of Systems:  No other skin or systemic complaints except as noted in HPI or Assessment and Plan.  Objective  Well appearing patient in no apparent distress; mood and affect are within normal limits.  A focused examination was performed including face, elbows. Relevant physical exam findings are noted in the Assessment and Plan.  Left Elbow, face, hands, fingers Small ulceration of the left elbow 5.1m; mild erythema and tightening of the skin of the elbows, bil foot dorsum; tightening of the skin and erythema of the PIPs and MCPs, ulceration R 5th PIP  R great toenail Nail dystrophy with thickening, yellow white discoloration.  Photo compared, no worsening    Assessment & Plan  Scleroderma (HCC) Left Elbow, face, hands, fingers  With ulcerations. Pt with Raynaud's, Chronic and persistent condition with duration or expected duration over one year. Condition is symptomatic/ bothersome to patient. Not currently at goal.   Continue Sildenafil Citrate as prescribed by Dr PPosey Pronto Continue Clobetasol Cream qd/bid prn for increasing symptoms for up to 2 weeks. Start Stratamed Apply up to twice a day to open sores until healed- sample given to patient today. Start tacrolimus 0.1% ointment Apply to AA qd/bid dsp 60g 1Rf. May try Voltaren Gel to dorsum feet if painful.    tacrolimus (PROTOPIC) 0.1 % ointment - Left  Elbow, face, hands, fingers Apply to affected areas once to twice a day.  Tinea unguium R great toenail  Chronic and persistent condition with duration or expected duration over one year. Condition is symptomatic / bothersome to patient. Not to goal. Some improvement compared to previous photo.  Continue Tavaboraole solution qhs toenails.  Will take at least a year to grow out the toenail.  Related Medications Tavaborole (KERYDIN) 5 % SOLN Apply to affected toenail every night until improved.   Return as scheduled, for TBSE.  I,Jamesetta Orleans CMA, am acting as scribe for TBrendolyn Patty MD .  Documentation: I have reviewed the above documentation for accuracy and completeness, and I agree with the above.  TBrendolyn PattyMD

## 2022-04-21 ENCOUNTER — Ambulatory Visit (INDEPENDENT_AMBULATORY_CARE_PROVIDER_SITE_OTHER): Payer: BC Managed Care – PPO | Admitting: Internal Medicine

## 2022-04-21 ENCOUNTER — Ambulatory Visit (INDEPENDENT_AMBULATORY_CARE_PROVIDER_SITE_OTHER): Payer: BC Managed Care – PPO

## 2022-04-21 ENCOUNTER — Encounter: Payer: Self-pay | Admitting: Internal Medicine

## 2022-04-21 ENCOUNTER — Ambulatory Visit: Payer: BC Managed Care – PPO | Admitting: Internal Medicine

## 2022-04-21 VITALS — BP 132/70 | HR 70 | Temp 98.3°F | Ht 64.0 in | Wt 166.2 lb

## 2022-04-21 DIAGNOSIS — T148XXA Other injury of unspecified body region, initial encounter: Secondary | ICD-10-CM | POA: Diagnosis not present

## 2022-04-21 DIAGNOSIS — M542 Cervicalgia: Secondary | ICD-10-CM | POA: Diagnosis not present

## 2022-04-21 DIAGNOSIS — M25512 Pain in left shoulder: Secondary | ICD-10-CM

## 2022-04-21 DIAGNOSIS — L03114 Cellulitis of left upper limb: Secondary | ICD-10-CM | POA: Diagnosis not present

## 2022-04-21 DIAGNOSIS — M25511 Pain in right shoulder: Secondary | ICD-10-CM

## 2022-04-21 DIAGNOSIS — J0141 Acute recurrent pansinusitis: Secondary | ICD-10-CM | POA: Diagnosis not present

## 2022-04-21 MED ORDER — DOXYCYCLINE HYCLATE 100 MG PO TABS: 100.0000 mg | ORAL_TABLET | Freq: Two times a day (BID) | ORAL | 0 refills | Status: DC

## 2022-04-21 MED ORDER — MUPIROCIN 2 % EX OINT: TOPICAL_OINTMENT | Freq: Three times a day (TID) | CUTANEOUS | 0 refills | Status: DC

## 2022-04-21 NOTE — Progress Notes (Addendum)
Chief Complaint  Patient presents with   Follow-up    Infected elbow   F/u  1. Left elbow wound with redness 0.5 to < 1 cm h/o raynauds with h/o ulcers, scleroderma  For Raynauds she is normally on sildenafil 20 mg but she thought this was causing nasal congestion, sinus issues so Dr. Brigitte Pulse (rheum) changed her to cialis 20 mg qd but she has not started this yet. She had been on norvasc for raynauds but now on Cialis generic 2. Sinus pressure and h/a x 1 month had been tx'ed doxycycline +steroids not better referral urgently to Dr. Trevor Iha NS, astelin, triamcinolone nasal and flonase 3. Left lower leg wound as well and she feels left leg slightly larger than right  4. C/o neck pain and left shoulder pain mild to moderate turning head to the left  She has tried voltaren gel    Review of Systems  Constitutional:  Negative for weight loss.  HENT:  Negative for hearing loss.   Eyes:  Negative for blurred vision.  Respiratory:  Negative for shortness of breath.   Cardiovascular:  Negative for chest pain.  Gastrointestinal:  Negative for abdominal pain and blood in stool.  Genitourinary:  Negative for dysuria.  Musculoskeletal:  Positive for joint pain and neck pain. Negative for falls.  Skin:  Negative for rash.  Neurological:  Negative for headaches.  Psychiatric/Behavioral:  Negative for depression.    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HCC)    Scleroderma (HCC)    positive FANA, positive SCL-70 abs, raynauds, mild pulmonary hypertension, normal DLCO   Tricuspid regurgitation    Vitamin D deficiency    Past Surgical History:  Procedure Laterality Date   CESAREAN SECTION  1994   COLONOSCOPY WITH PROPOFOL N/A 05/29/2015   Procedure: COLONOSCOPY WITH PROPOFOL;  Surgeon: Manya Silvas, MD;  Location: [facility];  Service: Endoscopy;   Laterality: N/A;   DILATION AND CURETTAGE OF UTERUS  1992   DILATION AND CURETTAGE OF UTERUS  1993   DILATION AND CURETTAGE OF UTERUS  2011   VEIN SURGERY     Family History  Problem Relation Age of Onset   Heart disease Father        myocardial infarction   Arthritis/Rheumatoid Father    Hypertension Father    Hyperlipidemia Mother    Breast cancer Maternal Grandmother    Colon cancer Neg Hx    Social History   Socioeconomic History   Marital status: Married    Spouse name: Not on file   Number of children: 1   Years of education: Not on file   Highest education level: Not on file  Occupational History   Not on file  Tobacco Use   Smoking status: Never   Smokeless tobacco: Never  Vaping Use   Vaping Use: Never used  Substance and Sexual Activity   Alcohol use: No    Alcohol/week: 0.0 standard drinks of alcohol   Drug use: No   Sexual activity: Not on file  Other Topics Concern   Not on file  Social History Narrative   Not on file   Social Determinants of Health   Financial Resource Strain: Not on file  Food Insecurity: Not on file  Transportation Needs: Not on file  Physical Activity: Not on file  Stress: Not  on file  Social Connections: Not on file  Intimate Partner Violence: Not on file   Current Meds  Medication Sig   azelastine (ASTELIN) 0.1 % nasal spray PLACE 1 SPRAY INTO BOTH NOSTRILS 2 (TWO) TIMES DAILY. USE IN EACH NOSTRIL AS DIRECTED   Biotin 5000 MCG TABS    Calcium-Magnesium-Vitamin D (CALCIUM 500 PO) Take by mouth.   Cholecalciferol (VITAMIN D) 50 MCG (2000 UT) tablet Take 2,000 Units by mouth daily.   clobetasol cream (TEMOVATE) 0.05 % Apply to elbows twice a day as needed. Avoid face, groin, axilla.   Cranberry 400 MG CAPS Take by mouth.   doxycycline (VIBRA-TABS) 100 MG tablet Take 1 tablet (100 mg total) by mouth 2 (two) times daily. With food   fish oil-omega-3 fatty acids 1000 MG capsule Take 1 g by mouth daily.   MISC NATURAL PRODUCTS  PO Herbal Supplement   Multiple Vitamin (MULTIVITAMIN) tablet Take 1 tablet by mouth daily.   mupirocin ointment (BACTROBAN) 2 % Apply topically 3 (three) times daily. Left elbow   nitroGLYCERIN (NITROGLYN) 2 % ointment Apply 0.5 inches topically 3 (three) times daily as needed.    omeprazole (PRILOSEC) 20 MG capsule Take 20 mg by mouth 2 (two) times daily.    Probiotic Product (PROBIOTIC PO) Take by mouth.   tacrolimus (PROTOPIC) 0.1 % ointment Apply to affected areas once to twice a day.   tadalafil (CIALIS) 20 MG tablet Take 20 mg by mouth daily as needed for erectile dysfunction.   Tavaborole (KERYDIN) 5 % SOLN Apply to affected toenail every night until improved.   [DISCONTINUED] mupirocin ointment (BACTROBAN) 2 % Apply topically daily as needed.   Allergies  Allergen Reactions   Sulfa Antibiotics Nausea And Vomiting   Penicillins Rash   No results found for this or any previous visit (from the past 2160 hour(s)). Objective  Body mass index is 28.53 kg/m. Wt Readings from Last 3 Encounters:  04/21/22 166 lb 3.2 oz (75.4 kg)  03/17/22 162 lb (73.5 kg)  01/11/22 163 lb 6.4 oz (74.1 kg)   Temp Readings from Last 3 Encounters:  04/21/22 98.3 F (36.8 C) (Oral)  03/17/22 98.2 F (36.8 C) (Oral)  01/11/22 98.3 F (36.8 C) (Oral)   BP Readings from Last 3 Encounters:  04/21/22 132/70  03/17/22 110/70  01/11/22 128/78   Pulse Readings from Last 3 Encounters:  04/21/22 70  03/17/22 76  01/11/22 83    Physical Exam Vitals and nursing note reviewed.  Constitutional:      Appearance: Normal appearance. She is well-developed and well-groomed.  HENT:     Head: Normocephalic and atraumatic.  Eyes:     Conjunctiva/sclera: Conjunctivae normal.     Pupils: Pupils are equal, round, and reactive to light.  Cardiovascular:     Rate and Rhythm: Normal rate and regular rhythm.     Heart sounds: Normal heart sounds. No murmur heard. Pulmonary:     Effort: Pulmonary effort is  normal.     Breath sounds: Normal breath sounds.  Abdominal:     General: Abdomen is flat. Bowel sounds are normal.     Tenderness: There is no abdominal tenderness.  Musculoskeletal:        General: No tenderness.  Skin:    General: Skin is warm and dry.       Neurological:     General: No focal deficit present.     Mental Status: She is alert and oriented to person, place, and time. Mental  status is at baseline.     Cranial Nerves: Cranial nerves 2-12 are intact.     Motor: Motor function is intact.     Coordination: Coordination is intact.     Gait: Gait is intact.  Psychiatric:        Attention and Perception: Attention and perception normal.        Mood and Affect: Mood and affect normal.        Speech: Speech normal.        Behavior: Behavior normal. Behavior is cooperative.        Thought Content: Thought content normal.        Cognition and Memory: Cognition and memory normal.        Judgment: Judgment normal.     Assessment  Plan  Cellulitis of left elbow - Plan: mupirocin ointment (BACTROBAN) 2 %, doxycycline (VIBRA-TABS) 100 MG tablet  Open wound - Plan: mupirocin ointment (BACTROBAN) 2 %  Acute recurrent pansinusitis - Plan: Ambulatory referral to ENT Dr. Orlin Hilding  Cont NS, Astelin, Flonase add in xyzal   Cervicalgia - Plan: DG Cervical Spine Complete IMPRESSION: Mild degenerative disc and facet disease changes cervical spine with osseous demineralization.   No acute abnormalities.     Electronically Signed   By: Lavonia Dana M.D.   On: 04/23/2022 14:02   Acute pain of both shoulders - Plan: DG Shoulder Left xray 04/21/22 negative  Pt has been notified of both xray results and has verbalized understanding. Pt is agreeable to both PT and Ortho but wants to try PT first and see if ortho is needed in the future. Pt also stated she has been referred for occupation therapy for her hand by Dr. Nicki Reaper and is wondering if the same facility has PT side that she can  just go to  Referred to [facility] outpatient PT on [address]  Declines flu and covid and shingrix   Provider: Dr. Olivia Mackie McLean-Scocuzza-Internal Medicine

## 2022-04-21 NOTE — Patient Instructions (Addendum)
Stye A stye, also known as a hordeolum, is a bump that forms on an eyelid. It may look like a pimple next to the eyelash. A stye can form inside the eyelid (internal stye) or outside the eyelid (external stye). A stye can cause redness, swelling, and pain on the eyelid. Styes are very common. Anyone can get them at any age. They usually occur in just one eye at a time, but you may have more than one in either eye. What are the causes? A stye is caused by an infection. The infection is almost always caused by bacteria called Staphylococcus aureus. This is a common type of bacteria that lives on the skin. An internal stye may result from an infected oil-producing gland inside the eyelid. An external stye may be caused by an infection at the base of the eyelash (hair follicle). What increases the risk? You are more likely to develop a stye if: You have had a stye before. You have any of these conditions: Red, itchy, inflamed eyelids (blepharitis). A skin condition such as seborrheic dermatitis or rosacea. High fat levels in your blood (lipids). Dry eyes. What are the signs or symptoms? The most common symptom of a stye is eyelid pain. Internal styes are more painful than external styes. Other symptoms may include: Painful swelling of your eyelid. A scratchy feeling in your eye. Tearing and redness of your eye. A pimple-like bump on the edge of the eyelid. Pus draining from the stye. How is this diagnosed? Your health care provider may be able to diagnose a stye just by examining your eye. The health care provider may also check to make sure: You do not have a fever or other signs of a more serious infection. The infection has not spread to other parts of your eye or areas around your eye. How is this treated? Most styes will clear up in a few days without treatment or with warm compresses applied to the area. You may need to use antibiotic drops or ointment to treat an infection. Sometimes,  steroid drops or ointment are used in addition to antibiotics. In some cases, your health care provider may give you a small steroid injection in the eyelid. If your stye does not heal with routine treatment, your health care provider may drain pus from the stye using a thin blade or needle. This may be done if the stye is large, causing a lot of pain, or affecting your vision. Follow these instructions at home: Take over-the-counter and prescription medicines only as told by your health care provider. This includes eye drops or ointments. If you were prescribed an antibiotic medicine, steroid medicine, or both, apply or use them as told by your health care provider. Do not stop using the medicine even if your condition improves. Apply a warm, wet cloth (warm compress) to your eye for 5-10 minutes, 4 to 6 times a day. Clean the affected eyelid as directed by your health care provider. Do not wear contact lenses or eye makeup until your stye has healed and your health care provider says that it is safe. Do not try to pop or drain the stye. Do not rub your eye. Contact a health care provider if: You have chills or a fever. Your stye does not go away after several days. Your stye affects your vision. Your eyeball becomes swollen, red, or painful. Get help right away if: You have pain when moving your eye around. Summary A stye is a bump that forms  on an eyelid. It may look like a pimple next to the eyelash. A stye can form inside the eyelid (internal stye) or outside the eyelid (external stye). A stye can cause redness, swelling, and pain on the eyelid. Your health care provider may be able to diagnose a stye just by examining your eye. Apply a warm, wet cloth (warm compress) to your eye for 5-10 minutes, 4 to 6 times a day. This information is not intended to replace advice given to you by your health care provider. Make sure you discuss any questions you have with your health care  provider. Document Revised: 09/09/2020 Document Reviewed: 09/09/2020 Elsevier Patient Education  [place]  Raynaud's phenomenon is a condition that affects the blood vessels (arteries) that carry blood to the fingers and toes. The arteries that supply blood to the ears, lips, nipples, or the tip of the nose might also be affected. Raynaud's phenomenon causes the arteries to become narrow temporarily (spasm). As a result, the flow of blood to the affected areas is temporarily decreased. This usually occurs in response to cold temperatures or stress. During an attack, the skin in the affected areas turns white, then blue, and finally red. A person may also feel tingling or numbness in those areas. Attacks usually last for only a brief period, and then the blood flow to the area returns to normal. In most cases, Raynaud's phenomenon does not cause serious health problems. What are the causes? In many cases, the cause of this condition is not known. The condition may occur on its own (primary Raynaud's phenomenon) or may be associated with other diseases or factors (secondary Raynaud's phenomenon). Possible causes may include: Diseases or medical conditions that damage the arteries. Injuries and repetitive actions that hurt the hands or feet. Being exposed to certain chemicals. Taking medicines that narrow the arteries. Other medical conditions, such as lupus, scleroderma, rheumatoid arthritis, thyroid problems, blood disorders, Sjogren syndrome, or atherosclerosis. What increases the risk? The following factors may make you more likely to develop this condition: Being 41-25 years old. Being female. Having a family history of Raynaud's phenomenon. Living in a cold climate. Smoking. What are the signs or symptoms? Symptoms of this condition usually occur when you are exposed to cold temperatures or when you have emotional stress. The symptoms may last for a few  minutes or up to several hours. They usually affect your fingers but may also affect your toes, nipples, lips, ears, or the tip of your nose. Symptoms may include: Changes in skin color. The skin in the affected areas will turn pale or white. The skin may then change from white to bluish to red as normal blood flow returns to the area. Numbness, tingling, or pain in the affected areas. In severe cases, symptoms may include: Skin sores. Tissues decaying and dying (gangrene). How is this diagnosed? This condition may be diagnosed based on: Your symptoms and medical history. A physical exam. During the exam, you may be asked to put your hands in cold water to check for a reaction to cold temperature. Tests, such as: Blood tests to check for other diseases or conditions. A test to check the movement of blood through your arteries and veins (vascular ultrasound). A test in which the skin at the base of your fingernail is examined under a microscope (nailfold capillaroscopy). How is this treated? During an episode, you can take actions to help symptoms go away faster. Options include moving your arms around  in a windmill pattern, warming your fingers under warm water, or placing your fingers in a warm body fold, such as your armpit. Long-term treatment for this condition often involves making lifestyle changes and taking steps to control your exposure to cold temperature. For more severe cases, medicine (calcium channel blockers) may be used to improve blood circulation. Follow these instructions at home: Avoiding cold temperatures Take these steps to avoid exposure to cold: If possible, stay indoors during cold weather. When you go outside during cold weather, dress in layers and wear mittens, a hat, a scarf, and warm footwear. Wear mittens or gloves when handling ice or frozen food. Use holders for glasses or cans containing cold drinks. Let warm water run for a while before taking a shower or  bath. Warm up the car before driving in cold weather. Lifestyle If possible, avoid stressful and emotional situations. Try to find ways to manage your stress, such as: Exercise. Yoga. Meditation. Biofeedback. Do not use any products that contain nicotine or tobacco. These products include cigarettes, chewing tobacco, and vaping devices, such as e-cigarettes. If you need help quitting, ask your health care provider. Avoid secondhand smoke. Limit your use of caffeine. Switch to decaffeinated coffee, tea, and soda. Avoid chocolate. Avoid vibrating tools and machinery. General instructions Protect your hands and feet from injuries, cuts, or bruises. Avoid wearing tight rings or wristbands. Wear loose fitting socks and comfortable, roomy shoes. Take over-the-counter and prescription medicines only as told by your health care provider. Where to find support Raynaud's Association: www.raynauds.org Where to find more information [employer] of Arthritis and Musculoskeletal and Skin Diseases: www.niams.SouthExposed.es Contact a health care provider if: Your discomfort becomes worse despite lifestyle changes. You develop sores on your fingers or toes that do not heal. You have breaks in the skin on your fingers or toes. You have a fever. You have pain or swelling in your joints. You have a rash. Your symptoms occur on only one side of your body. Get help right away if: Your fingers or toes turn black. You have severe pain in the affected areas. These symptoms may represent a serious problem that is an emergency. Do not wait to see if the symptoms will go away. Get medical help right away. Call your local emergency services (911 in the [place] Do not drive yourself to the hospital. Summary Raynaud's phenomenon is a condition that affects the arteries that carry blood to the fingers, toes, ears, lips, nipples, or the tip of the nose. In many cases, the cause of this condition is not  known. Symptoms of this condition include changes in skin color along with numbness and tingling in the affected area. Treatment for this condition includes lifestyle changes and reducing exposure to cold temperatures. Medicines may be used for severe cases of the condition. Contact your health care provider if your condition worsens despite treatment. This information is not intended to replace advice given to you by your health care provider. Make sure you discuss any questions you have with your health care provider. Document Revised: 09/08/2020 Document Reviewed: 09/08/2020 Elsevier Patient Education  [place].  Tadalafil Tablets (Erectile Dysfunction, BPH) What is this medication? TADALAFIL (tah DA la fil) treats erectile dysfunction (ED). It works by increasing blood flow to the penis, which helps to maintain an erection. It may also be used to treat symptoms of an enlarged prostate (benign prostatic hyperplasia). This medicine may be used for other purposes; ask your health care provider or pharmacist  if you have questions. COMMON BRAND NAME(S): Kathaleen Bury, Cialis What should I tell my care team before I take this medication? They need to know if you have any of these conditions: Anatomical deformation of the penis, Peyronie's disease, or history of priapism (painful and prolonged erection) Bleeding disorders Eye or vision problems, including a rare inherited eye disease called retinitis pigmentosa Heart disease, angina, a history of heart attack, irregular heart beats, or other heart problems High or low blood pressure History of blood diseases, like sickle cell anemia or leukemia History of stomach bleeding Kidney disease Liver disease Stroke An unusual or allergic reaction to tadalafil, other medications, foods, dyes, or preservatives Pregnant or trying to get pregnant Breast-feeding How should I use this medication? Take this medication by mouth with a glass of  water. Follow the directions on the prescription label. You may take this medication with or without meals. When this medication is used for erection problems, your care team may prescribe it to be taken once daily or as needed. If you are taking the medication as needed, you may be able to have sexual activity 30 minutes after taking it and for up to 36 hours after taking it. Whether you are taking the medication as needed or once daily, you should not take more than one dose per day. If you are taking this medication for symptoms of benign prostatic hyperplasia (BPH) or to treat both BPH and an erection problem, take the dose once daily at about the same time each day. Do not take your medication more often than directed. Talk to your care team about the use of this medication in children. Special care may be needed. Overdosage: If you think you have taken too much of this medicine contact a poison control center or emergency room at once. NOTE: This medicine is only for you. Do not share this medicine with others. What if I miss a dose? If you are taking this medication as needed for erection problems, this does not apply. If you miss a dose while taking this medication once daily for an erection problem, benign prostatic hyperplasia, or both, take it as soon as you remember, but do not take more than one dose per day. What may interact with this medication? Do not take this medication with any of the following: Nitrates like amyl nitrite, isosorbide dinitrate, isosorbide mononitrate, nitroglycerin Other medications for erectile dysfunction like avanafil, sildenafil, vardenafil Other tadalafil products (Adcirca) Riociguat This medication may also interact with the following: Certain medications for high blood pressure Certain medications for the treatment of HIV infection or AIDS Certain medications used for fungal or yeast infections, like fluconazole, itraconazole, ketoconazole, and  voriconazole Certain medications used for seizures like carbamazepine, phenytoin, and phenobarbital Grapefruit juice Macrolide antibiotics like clarithromycin, erythromycin, troleandomycin Medications for prostate problems Rifabutin, rifampin or rifapentine This list may not describe all possible interactions. Give your health care provider a list of all the medicines, herbs, non-prescription drugs, or dietary supplements you use. Also tell them if you smoke, drink alcohol, or use illegal drugs. Some items may interact with your medicine. What should I watch for while using this medication? If you notice any changes in your vision while taking this medication, call your care team as soon as possible. Stop using this medication and call your care team right away if you have a loss of sight in one or both eyes. Contact your care team right away if the erection lasts longer than 4 hours or  if it becomes painful. This may be a sign of serious problem and must be treated right away to prevent permanent damage. If you experience symptoms of nausea, dizziness, chest pain or arm pain upon initiation of sexual activity after taking this medication, you should refrain from further activity and call your care team as soon as possible. Do not drink alcohol to excess (examples, 5 glasses of wine or 5 shots of whiskey) when taking this medication. When taken in excess, alcohol can increase your chances of getting a headache or getting dizzy, increasing your heart rate or lowering your blood pressure. Using this medication does not protect you or your partner against HIV infection (the virus that causes AIDS) or other sexually transmitted diseases. What side effects may I notice from receiving this medication? Side effects that you should report to your care team as soon as possible: Allergic reactions--skin rash, itching, hives, swelling of the face, lips, tongue, or throat Hearing loss or ringing in ears Heart  attack--pain or tightness in the chest, shoulders, arms, or jaw, nausea, shortness of breath, cold or clammy skin, feeling faint or lightheaded Low blood pressure--dizziness, feeling faint or lightheaded, blurry vision Prolonged or painful erection Redness, blistering, peeling, or loosening of the skin, including inside the mouth Stroke--sudden numbness or weakness of the face, arm, or leg, trouble speaking, confusion, trouble walking, loss of balance or coordination, dizziness, severe headache, change in vision Sudden vision loss in one or both eyes Side effects that usually do not require medical attention (report to your care team if they continue or are bothersome): Back pain Facial flushing or redness Headache Muscle pain Runny or stuffy nose Upset stomach This list may not describe all possible side effects. Call your doctor for medical advice about side effects. You may report side effects to FDA at 1-800-FDA-1088. Where should I keep my medication? Keep out of the reach of children. Store at room temperature between 15 and 30 degrees C (59 and 86 degrees F). Throw away any unused medication after the expiration date. NOTE: This sheet is a summary. It may not cover all possible information. If you have questions about this medicine, talk to your doctor, pharmacist, or health care provider.  2023 Elsevier/Gold Standard (2020-09-17 00:00:00)

## 2022-04-25 ENCOUNTER — Telehealth: Payer: Self-pay

## 2022-04-25 NOTE — Addendum Note (Signed)
Addended by: Orland Mustard on: 04/25/2022 10:10 PM   Modules accepted: Orders

## 2022-04-25 NOTE — Telephone Encounter (Signed)
LMOM for pt to CB in regards to xray results

## 2022-05-04 ENCOUNTER — Encounter: Payer: Self-pay | Admitting: Otolaryngology

## 2022-05-11 ENCOUNTER — Encounter: Payer: Self-pay | Admitting: Internal Medicine

## 2022-05-17 ENCOUNTER — Other Ambulatory Visit (INDEPENDENT_AMBULATORY_CARE_PROVIDER_SITE_OTHER): Payer: BC Managed Care – PPO

## 2022-05-17 DIAGNOSIS — M349 Systemic sclerosis, unspecified: Secondary | ICD-10-CM

## 2022-05-17 DIAGNOSIS — Z13220 Encounter for screening for lipoid disorders: Secondary | ICD-10-CM

## 2022-05-17 LAB — LIPID PANEL
Cholesterol: 200 mg/dL (ref 0–200)
HDL: 56.5 mg/dL (ref >39.00)
LDL Cholesterol: 115 mg/dL — ABNORMAL HIGH (ref 0–99)
NonHDL: 143.03
Total CHOL/HDL Ratio: 4
Triglycerides: 142 mg/dL (ref 0.0–149.0)
VLDL: 28.4 mg/dL (ref 0.0–40.0)

## 2022-05-17 LAB — HEPATIC FUNCTION PANEL
ALT: 11 U/L (ref 0–35)
AST: 17 U/L (ref 0–37)
Albumin: 4.2 g/dL (ref 3.5–5.2)
Alkaline Phosphatase: 62 U/L (ref 39–117)
Bilirubin, Direct: 0.1 mg/dL (ref 0.0–0.3)
Total Bilirubin: 0.6 mg/dL (ref 0.2–1.2)
Total Protein: 6.7 g/dL (ref 6.0–8.3)

## 2022-05-17 LAB — BASIC METABOLIC PANEL
BUN: 13 mg/dL (ref 6–23)
CO2: 29 mEq/L (ref 19–32)
Calcium: 9.4 mg/dL (ref 8.4–10.5)
Chloride: 104 mEq/L (ref 96–112)
Creatinine, Ser: 0.84 mg/dL (ref 0.40–1.20)
GFR: 75.52 mL/min (ref >60.00)
Glucose, Bld: 86 mg/dL (ref 70–99)
Potassium: 4.3 mEq/L (ref 3.5–5.1)
Sodium: 141 mEq/L (ref 135–145)

## 2022-05-25 ENCOUNTER — Encounter: Payer: Self-pay | Admitting: Internal Medicine

## 2022-05-25 ENCOUNTER — Ambulatory Visit (INDEPENDENT_AMBULATORY_CARE_PROVIDER_SITE_OTHER): Payer: BC Managed Care – PPO | Admitting: Internal Medicine

## 2022-05-25 VITALS — BP 130/70 | HR 71 | Temp 97.8°F | Resp 16 | Ht 64.0 in | Wt 166.2 lb

## 2022-05-25 DIAGNOSIS — M5030 Other cervical disc degeneration, unspecified cervical region: Secondary | ICD-10-CM

## 2022-05-25 DIAGNOSIS — K2100 Gastro-esophageal reflux disease with esophagitis, without bleeding: Secondary | ICD-10-CM | POA: Diagnosis not present

## 2022-05-25 DIAGNOSIS — Z860100 Personal history of colon polyps, unspecified: Secondary | ICD-10-CM

## 2022-05-25 DIAGNOSIS — M349 Systemic sclerosis, unspecified: Secondary | ICD-10-CM

## 2022-05-25 DIAGNOSIS — R519 Headache, unspecified: Secondary | ICD-10-CM | POA: Diagnosis not present

## 2022-05-25 DIAGNOSIS — F419 Anxiety disorder, unspecified: Secondary | ICD-10-CM

## 2022-05-25 DIAGNOSIS — M799 Soft tissue disorder, unspecified: Secondary | ICD-10-CM

## 2022-05-25 DIAGNOSIS — I7300 Raynaud's syndrome without gangrene: Secondary | ICD-10-CM

## 2022-05-25 MED ORDER — GABAPENTIN 100 MG PO CAPS: 100.0000 mg | ORAL_CAPSULE | Freq: Every day | ORAL | 1 refills | Status: DC

## 2022-05-25 NOTE — Progress Notes (Signed)
Patient ID: CHAUNTA BEJARANO, female   DOB: 09/09/1960, 61 y.o.   MRN: [id number]   Subjective:    Patient ID: Denise Arnold, female    DOB: [birth year], 61 y.o.   MRN: [id number]    Patient here for  Chief Complaint  Patient presents with   Follow-up   .   HPI Work in appt.  Work in with concerns regarding f/u elbow lesion.  Was seen recently and placed on doxycycline and topical bactroban.  Elbow is better.  Seeing Dr Posey Pronto - f/u scleroderma.  Off norvasc.  Off sildenafill.  Was given cialis. Not taking.  Has f/u with Dr Brigitte Pulse (rheumatology - [facility]) 07/04/22.  Discussed medication for raynaud's . Has had issues with sinus pressure.  Pain into neck and shoulders.  PT next week.  Saw ENT Richardson Landry) - ok.  Eye MD - cataracts and floaters.  Continue f/u with ophthalmology.  Concern regarding clinching/grinding - teeth.  Saw dentist - feel contributing to headaches.  Also "cyst" left hand.  Breathing overall stable.  Eating.     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HCC)    Scleroderma (HCC)    positive FANA, positive SCL-70 abs, raynauds, mild pulmonary hypertension, normal DLCO   Tricuspid regurgitation    Vitamin D deficiency    Past Surgical History:  Procedure Laterality Date   CESAREAN SECTION  1994   COLONOSCOPY WITH PROPOFOL N/A 05/29/2015   Procedure: COLONOSCOPY WITH PROPOFOL;  Surgeon: Manya Silvas, MD;  Location: [place];  Service: Endoscopy;  Laterality: N/A;   DILATION AND CURETTAGE OF UTERUS  1992   DILATION AND CURETTAGE OF UTERUS  1993   DILATION AND CURETTAGE OF UTERUS  2011   VEIN SURGERY     Family History  Problem Relation Age of Onset   Heart disease Father        myocardial infarction   Arthritis/Rheumatoid Father    Hypertension Father    Hyperlipidemia Mother    Breast cancer Maternal Grandmother    Colon cancer Neg  Hx    Social History   Socioeconomic History   Marital status: Married    Spouse name: Not on file   Number of children: 1   Years of education: Not on file   Highest education level: Not on file  Occupational History   Not on file  Tobacco Use   Smoking status: Never   Smokeless tobacco: Never  Vaping Use   Vaping Use: Never used  Substance and Sexual Activity   Alcohol use: No    Alcohol/week: 0.0 standard drinks of alcohol   Drug use: No   Sexual activity: Not on file  Other Topics Concern   Not on file  Social History Narrative   Not on file   Social Determinants of Health   Financial Resource Strain: Not on file  Food Insecurity: Not on file  Transportation Needs: Not on file  Physical Activity: Not on file  Stress: Not on file  Social Connections: Not on file     Review of Systems  Constitutional:  Negative for appetite change and unexpected weight change.  HENT:  Negative for congestion and sinus pressure.   Respiratory:  Negative for cough, chest tightness and shortness of breath.   Cardiovascular:  Negative for chest pain, palpitations and leg swelling.  Gastrointestinal:  Negative for abdominal pain, diarrhea, nausea and vomiting.  Genitourinary:  Negative for difficulty urinating and dysuria.  Musculoskeletal:  Negative for joint swelling and myalgias.  Skin:  Negative for color change and rash.  Neurological:  Positive for headaches. Negative for dizziness.  Psychiatric/Behavioral:  Negative for dysphoric mood.        Increased stress/anxiety.        Objective:     BP 130/70 (BP Location: Left Arm, Patient Position: Sitting, Cuff Size: Small)   Pulse 71   Temp 97.8 F (36.6 C) (Temporal)   Resp 16   Ht '5\' 4"'$  (1.626 m)   Wt 166 lb 3.2 oz (75.4 kg)   LMP 02/14/2010   SpO2 97%   BMI 28.53 kg/m  Wt Readings from Last 3 Encounters:  05/25/22 166 lb 3.2 oz (75.4 kg)  04/21/22 166 lb 3.2 oz (75.4 kg)  03/17/22 162 lb (73.5 kg)    Physical  Exam Vitals reviewed.  Constitutional:      General: She is not in acute distress.    Appearance: Normal appearance.  HENT:     Head: Normocephalic and atraumatic.     Right Ear: External ear normal.     Left Ear: External ear normal.  Eyes:     General: No scleral icterus.       Right eye: No discharge.        Left eye: No discharge.     Conjunctiva/sclera: Conjunctivae normal.  Neck:     Thyroid: No thyromegaly.  Cardiovascular:     Rate and Rhythm: Normal rate and regular rhythm.  Pulmonary:     Effort: No respiratory distress.     Breath sounds: Normal breath sounds. No wheezing.  Abdominal:     General: Bowel sounds are normal.     Palpations: Abdomen is soft.     Tenderness: There is no abdominal tenderness.  Musculoskeletal:        General: No swelling or tenderness.     Cervical back: Neck supple. No tenderness.  Lymphadenopathy:     Cervical: No cervical adenopathy.  Skin:    Findings: No erythema or rash.     Comments: Small residual open lesion - elbow.  No surrounding erythema.    Neurological:     Mental Status: She is alert.  Psychiatric:        Mood and Affect: Mood normal.        Behavior: Behavior normal.      Outpatient Encounter Medications as of 05/25/2022  Medication Sig   azelastine (ASTELIN) 0.1 % nasal spray PLACE 1 SPRAY INTO BOTH NOSTRILS 2 (TWO) TIMES DAILY. USE IN EACH NOSTRIL AS DIRECTED   Biotin 5000 MCG TABS    Calcium-Magnesium-Vitamin D (CALCIUM 500 PO) Take by mouth.   Cholecalciferol (VITAMIN D) 50 MCG (2000 UT) tablet Take 2,000 Units by mouth daily.   clobetasol cream (TEMOVATE) 0.05 % Apply to elbows twice a day as needed. Avoid face, groin, axilla.   Cranberry 400 MG CAPS Take by mouth.   fish oil-omega-3 fatty acids 1000 MG capsule Take 1 g by mouth daily.   gabapentin (NEURONTIN) 100 MG capsule Take 1 capsule (100 mg total) by mouth at bedtime.   MISC NATURAL PRODUCTS PO Herbal Supplement   Multiple Vitamin (MULTIVITAMIN)  tablet Take 1 tablet by mouth daily.   mupirocin ointment (BACTROBAN) 2 % Apply topically 3 (three) times daily. Left elbow   nitroGLYCERIN (NITROGLYN) 2 % ointment Apply 0.5 inches  topically 3 (three) times daily as needed.    omeprazole (PRILOSEC) 20 MG capsule Take 20 mg by mouth 2 (two) times daily.    Probiotic Product (PROBIOTIC PO) Take by mouth.   tacrolimus (PROTOPIC) 0.1 % ointment Apply to affected areas once to twice a day.   Tavaborole (KERYDIN) 5 % SOLN Apply to affected toenail every night until improved.   [DISCONTINUED] doxycycline (VIBRA-TABS) 100 MG tablet Take 1 tablet (100 mg total) by mouth 2 (two) times daily. With food   [DISCONTINUED] tadalafil (CIALIS) 20 MG tablet Take 20 mg by mouth daily as needed for erectile dysfunction.   No facility-administered encounter medications on file as of 05/25/2022.     Lab Results  Component Value Date   WBC 4.8 12/28/2021   HGB 12.8 12/28/2021   HCT 39.2 12/28/2021   PLT 195.0 12/28/2021   GLUCOSE 86 05/17/2022   CHOL 200 05/17/2022   TRIG 142.0 05/17/2022   HDL 56.50 05/17/2022   LDLCALC 115 (H) 05/17/2022   ALT 11 05/17/2022   AST 17 05/17/2022   NA 141 05/17/2022   K 4.3 05/17/2022   CL 104 05/17/2022   CREATININE 0.84 05/17/2022   BUN 13 05/17/2022   CO2 29 05/17/2022   TSH 1.98 12/28/2021    MM DIAG BREAST TOMO BILATERAL  Result Date: 01/31/2022 CLINICAL DATA:  Patient describes intermittent generalized pain within the upper-outer quadrant of the LEFT breast and axillary tail region. EXAM: DIGITAL DIAGNOSTIC BILATERAL MAMMOGRAM WITH TOMOSYNTHESIS AND CAD TECHNIQUE: Bilateral digital diagnostic mammography and breast tomosynthesis was performed. The images were evaluated with computer-aided detection. COMPARISON:  Previous exam(s). ACR Breast Density Category b: There are scattered areas of fibroglandular density. FINDINGS: There are no new dominant masses, suspicious calcifications or secondary signs of malignancy  within either breast. Specifically, there is no new mammographic finding within the upper-outer quadrant of the LEFT breast corresponding to the area of patient's clinical concern. IMPRESSION: No evidence of malignancy within either breast. RECOMMENDATION: 1.  Screening mammogram in one year.(Code:[id number]) 2. Breast pain is a common condition which will often resolve on its own without intervention. Benign causes of breast pain were discussed with the patient. The patient was encouraged to follow-up with referring physician if the pain persisted or worsened as this might indicate a need to evaluate the deeper structures of the underlying chest wall. Patient was instructed to return for additional imaging if a palpable lump developed. I have discussed the findings and recommendations with the patient. If applicable, a reminder letter will be sent to the patient regarding the next appointment. BI-RADS CATEGORY  1: Negative. Electronically Signed   By: Franki Cabot M.D.   On: 01/31/2022 14:32       Assessment & Plan:   Problem List Items Addressed This Visit     Anxiety - Primary    Discussed grinding teeth and clinching.  Has gabapentin.  Instructed to take '100mg'$  q hs.  See if helps with sleep, relaxing, headaches and pain.  Follow.  Call with update.       Degenerative cervical disc    Planning to start PT next week.       GERD (gastroesophageal reflux disease)    No acid reflux reported.  Prilosec.       Headache    Saw dentist. Concern regarding clinching and grinding her teeth.  Continue f/u with dentist - question of mouth guard.  Trial of gabapentin as outlined.  Follow.  Relevant Medications   gabapentin (NEURONTIN) 100 MG capsule   History of colonic polyps    Colonoscopy 03/2021.       Raynauds phenomenon    Off norvasc/procardia.  Off sildenafill.  Was given rx for cialis.  Discussed concern about not being on medication for raynaud's.  Discuss with rheumatology.        Scleroderma ([place])    Followed by rheumatology.  Now seeing Dr Posey Pronto and Dr Brigitte Pulse [facility]).        Soft tissue lesion of elbow region    Improved.  Continue bactroban.         Einar Pheasant, MD

## 2022-05-26 ENCOUNTER — Encounter: Payer: Self-pay | Admitting: Internal Medicine

## 2022-05-27 NOTE — Telephone Encounter (Signed)
If tolerating the gabapentin and no problems taking, then would recommend taking 2 ('100mg'$ ) tablets before bed.

## 2022-05-30 ENCOUNTER — Encounter: Payer: Self-pay | Admitting: Internal Medicine

## 2022-05-30 DIAGNOSIS — M799 Soft tissue disorder, unspecified: Secondary | ICD-10-CM | POA: Insufficient documentation

## 2022-05-30 NOTE — Assessment & Plan Note (Signed)
Off norvasc/procardia.  Off sildenafill.  Was given rx for cialis.  Discussed concern about not being on medication for raynaud's.  Discuss with rheumatology.

## 2022-05-30 NOTE — Assessment & Plan Note (Signed)
No acid reflux reported.  Prilosec.  

## 2022-05-30 NOTE — Assessment & Plan Note (Signed)
Followed by rheumatology.  Now seeing Dr Patel and Dr Shaw ([place]).   

## 2022-05-30 NOTE — Assessment & Plan Note (Signed)
Discussed grinding teeth and clinching.  Has gabapentin.  Instructed to take '100mg'$  q hs.  See if helps with sleep, relaxing, headaches and pain.  Follow.  Call with update.

## 2022-05-30 NOTE — Assessment & Plan Note (Signed)
Improved.  Continue bactroban.

## 2022-05-30 NOTE — Assessment & Plan Note (Signed)
Colonoscopy 03/2021.  

## 2022-05-30 NOTE — Assessment & Plan Note (Signed)
Planning to start PT next week.

## 2022-05-30 NOTE — Assessment & Plan Note (Signed)
Saw dentist. Concern regarding clinching and grinding her teeth.  Continue f/u with dentist - question of mouth guard.  Trial of gabapentin as outlined.  Follow.

## 2022-06-03 ENCOUNTER — Encounter: Payer: Self-pay | Admitting: Physical Therapy

## 2022-06-03 ENCOUNTER — Ambulatory Visit: Payer: BC Managed Care – PPO | Attending: Internal Medicine | Admitting: Physical Therapy

## 2022-06-03 DIAGNOSIS — M542 Cervicalgia: Secondary | ICD-10-CM | POA: Diagnosis not present

## 2022-06-03 DIAGNOSIS — M25612 Stiffness of left shoulder, not elsewhere classified: Secondary | ICD-10-CM | POA: Diagnosis present

## 2022-06-03 DIAGNOSIS — M25511 Pain in right shoulder: Secondary | ICD-10-CM | POA: Diagnosis not present

## 2022-06-03 DIAGNOSIS — M25611 Stiffness of right shoulder, not elsewhere classified: Secondary | ICD-10-CM | POA: Diagnosis present

## 2022-06-03 DIAGNOSIS — M25512 Pain in left shoulder: Secondary | ICD-10-CM | POA: Insufficient documentation

## 2022-06-03 NOTE — Therapy (Signed)
OUTPATIENT PHYSICAL THERAPY CERVICAL EVALUATION   Patient Name: Denise Arnold MRN: [id number] DOB:Apr 14, 1961, 61 y.o., female Today's Date: 06/03/2022  END OF SESSION:  PT End of Session - 06/03/22 0832     Visit Number 1    Date for PT Re-Evaluation 07/01/22    Authorization - Visit Number 1    Progress Note Due on Visit 10    PT Start Time 5400    PT Stop Time 0917    PT Time Calculation (min) 45 min    Activity Tolerance Patient tolerated treatment well;No increased pain    Behavior During Therapy WFL for tasks assessed/performed             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HCC)    Scleroderma ([place])    positive FANA, positive SCL-70 abs, raynauds, mild pulmonary hypertension, normal DLCO   Tricuspid regurgitation    Vitamin D deficiency    Past Surgical History:  Procedure Laterality Date   CESAREAN SECTION  1994   COLONOSCOPY WITH PROPOFOL N/A 05/29/2015   Procedure: COLONOSCOPY WITH PROPOFOL;  Surgeon: Manya Silvas, MD;  Location: [place];  Service: Endoscopy;  Laterality: N/A;   DILATION AND CURETTAGE OF UTERUS  1992   DILATION AND CURETTAGE OF UTERUS  1993   DILATION AND CURETTAGE OF UTERUS  2011   VEIN SURGERY     Patient Active Problem List   Diagnosis Date Noted   Soft tissue lesion of elbow region 05/30/2022   Sinusitis 03/27/2022   Finger ulcer ([place]) 01/16/2022   Breast pain, left 01/16/2022   Chest tightness 12/26/2021   Urinary urgency 10/11/2021   Headache 06/22/2021   Anxiety 06/06/2021   Palpitations 05/28/2021   Diarrhea 05/25/2021   Fatigue 05/25/2021   Vaginal pain 03/03/2019   Axillary fullness 01/13/2019   UTI (urinary tract infection) 10/27/2018   Cellulitis 01/16/2018   Health care maintenance 01/25/2015   History of colonic polyps 04/22/2013   Vitamin D deficiency 01/15/2013    Degenerative cervical disc 01/15/2013   GERD (gastroesophageal reflux disease) 01/15/2013   Scleroderma ([place]) 07/17/2012   Raynauds phenomenon 07/17/2012    PCP: Einar Pheasant, MD  REFERRING PROVIDER: McLean-Scocuzza, Nino Glow, MD  REFERRING DIAG:  M54.2 (ICD-10-CM) - Cervicalgia  M25.511,M25.512 (ICD-10-CM) - Acute pain of both shoulders    THERAPY DIAG:  Cervicalgia  Shoulder joint stiffness, bilateral  Rationale for Evaluation and Treatment: Rehabilitation  ONSET DATE: Insidious onset  SUBJECTIVE:  SUBJECTIVE STATEMENT: Cervical spine and bilateral shoulder stiffness  PERTINENT HISTORY:  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 Pt is also suffering from scleroderma, reports this could be a factor in current condition and lack of extension of fingers is demonstrated. Pt reports waking up most mornings feeling tightness in cervical spine and bilateral shoulder regions. Pt is receiving a mouth guard in the next couple of weeks to combat grinding teeth habit at night, pt hopes this will help with tension in neck and shoulders. 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 Pt also reports participating in a few other classes at the [employer] as well. Pt reports that this tightness of upper back does not get worse with these exercise classes but pushups are uncomfortable. PAIN:  Are you having pain? Yes: NPRS scale: TBA/10 Pain location: shoulders up into low neck Pain description: tightness Aggravating factors: Stretching out at the end of a workout, downward dog Relieving factors: stretching, supine  rolling exercises  PRECAUTIONS: None  WEIGHT BEARING RESTRICTIONS: No  FALLS:  Has patient fallen in last 6 months? 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   OBJECTIVE:   DIAGNOSTIC FINDINGS:  EXAM: CERVICAL SPINE - COMPLETE 4+ VIEW   COMPARISON:  01/02/2013   FINDINGS: Osseous demineralization.   Vertebral body heights maintained with scattered mild disc space narrowing.   Minor facet degenerative changes.   Bony foramina incompletely profiled.   No fracture, subluxation, or bone destruction.   Prevertebral soft tissues normal thickness.   Lung apices clear.   IMPRESSION: Mild degenerative disc and facet disease changes cervical spine with osseous demineralization.   No acute abnormalities.  EXAM: LEFT SHOULDER - 3 VIEW   COMPARISON:  None Available.   FINDINGS: There is no evidence of fracture or dislocation. There is no evidence of arthropathy or other focal bone abnormality. Visualized left lung is clear. Soft tissues are unremarkable.   IMPRESSION: Negative.  PATIENT SURVEYS:  FOTO 55/66  COGNITION: Overall cognitive status: Within functional limits for tasks assessed  SENSATION: Not tested  POSTURE: rounded shoulders and forward head  PALPATION: Reproduction of symptoms during CPA's of C6-T1. Moderate joint stiffness noted throughout these segments as well. UPA's of these segments also reproduced symptoms but not as much as CPA's. Limitations of bilateral glenohumeral joint noted with limitations in ROM. Symmetrical musculature noted for parascapular musculature and upper traps, moderate tightness of upper trapezius and parascapular  musculature.    CERVICAL ROM:   Active ROM A/PROM (deg) eval  Flexion WFL  Extension WFL  Right lateral flexion 15  Left lateral flexion 16  Right rotation 40  Left rotation 40   (Blank rows = not tested)  UPPER EXTREMITY ROM:  Active ROM Right eval Left eval  Shoulder flexion [facility]  Shoulder extension WFL, pain with OP WFL  Shoulder abduction [facility]  Shoulder adduction WFL, pain WFL, pain      Shoulder internal rotation 60 pain w OP 79  Shoulder external rotation 76 88  Elbow flexion    Elbow extension  Wrist flexion    Wrist extension    Wrist ulnar deviation    Wrist radial deviation    Wrist pronation    Wrist supination     (Blank rows = not tested)  UPPER EXTREMITY MMT:  MMT Right eval Left eval  Shoulder flexion 4 4  Shoulder extension 4- 4-  Shoulder abduction 4 4  Shoulder adduction        Shoulder internal rotation    Shoulder external rotation    Middle trapezius 4 4  Lower trapezius 4 4  Elbow flexion    Elbow extension    Wrist flexion    Wrist extension    Wrist ulnar deviation    Wrist radial deviation    Wrist pronation    Wrist supination    Grip strength     (Blank rows = not tested)     TODAY'S TREATMENT:                                                                                                                                Today's treatment consisted of examination and evaluation of mobility and strength of thoracic, cervical, and bilateral glenohumeral joints and muscles.   Cervical Rotation Snag 1 sets 10 reps bilaterally Sleeper's internal rotation stretch for R shoulder 1 set of 2, 30 second holds Thoracic extensions with hands behind head, 1 sets of 12 reps  PATIENT EDUCATION:  Education details: Patient was educated on diagnosis, anatomy and pathology involved, prognosis, role of PT, and was given an HEP, demonstrating exercise with proper form following verbal and tactile cues, and was given a paper hand  out to continue exercise at home. Pt was educated on and agreed to plan of care.  Person educated: Patient Education method: Explanation, Demonstration, Tactile cues, Verbal cues, and Handouts Education comprehension: verbalized understanding, returned demonstration, verbal cues required, and tactile cues required  HOME EXERCISE PROGRAM: Cervical Rotation Snag 3 sets 12-20 reps Sleeper's internal rotation stretch for R shoulder 3 sets of 30 second holds Thoracic extensions with hands behind head, 3 sets of 12-20 reps   ASSESSMENT:  CLINICAL IMPRESSION: Patient is a 61 y.o. female who was seen today for physical therapy evaluation and treatment for pain/soreness of cervical spine and acute pain of bilateral shoulders. Symmetrical musculature and moderate tightness noted during palpation for parascapular musculature and upper traps. Pt demonstrates impairments in mobility of C6-T1 spinal segments as well as bilateral glenohumeral joints. Pt also demonstrates decreased strength in parascapular musculature, and UE MMTs. Activity limitations include lifting objects, bathing, and activities involving maximum extension of elbows specifically pushups. 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 Patient will benefit from skilled physical therapy to decrease tightness of cervical and parascapular musculature as well as increase ROM and mobility of cervical/thoracic spine for increased community ambulation, increased level of function for teaching exercise classes as  well as continue independence in caring for grandchildren.   OBJECTIVE IMPAIRMENTS: decreased mobility, decreased ROM, decreased strength, and hypomobility.   ACTIVITY LIMITATIONS: lifting, bathing, and reach over head  PARTICIPATION LIMITATIONS: community activity and occupation  PERSONAL FACTORS: Age, Fitness, Profession, and 1 comorbidity:  Scleroderma  are also affecting patient's functional outcome.   REHAB POTENTIAL: Good  CLINICAL DECISION MAKING: Evolving/moderate complexity  EVALUATION COMPLEXITY: Moderate   GOALS: Goals reviewed with patient? No  SHORT TERM GOALS: Target date: 07/01/2022  Pt will be independent with HEP in order to improve strength and balance in order to decrease fall risk and improve function at home and work.  Baseline: HEP given 06/03/2022 Goal status: INITIAL   LONG TERM GOALS: Target date: 07/29/2021  Patient will increase FOTO score to 66 to demonstrate predicted increase in functional mobility to complete ADLs.  Baseline: 55 Goal status: INITIAL  2.  Pt will demonstrate  and increase to at least 20 degrees of lateral flexion on both sides for improved community ambulation. Baseline: 06/03/2022: L:16 R:15 Goal status: INITIAL  3.  Pt will increase all UE and parascapular MMT's to a level of at least 4+ for increased independence with ADL and improved level of function during exercise classes. Baseline: See eval MMT chart Goal status: INITIAL  4. Pt will report waking up with out upper back tightness/pain 4 out of 7 days for increased QoL.  Baseline: 06/03/2022: almost every morning  Goal status: INITIAL   PLAN:  PT FREQUENCY: 1-2x/week  PT DURATION: 8 weeks  PLANNED INTERVENTIONS: Therapeutic exercises, Therapeutic activity, Neuromuscular re-education, Balance training, Gait training, Patient/Family education, Self Care, Joint mobilization, Dry Needling, and Manual therapy  PLAN FOR NEXT SESSION: DNF    Durwin Reges DPT  Durwin Reges, PT 06/03/2022, 11:39 AM

## 2022-06-07 ENCOUNTER — Other Ambulatory Visit: Payer: Self-pay

## 2022-06-07 DIAGNOSIS — L03114 Cellulitis of left upper limb: Secondary | ICD-10-CM

## 2022-06-07 DIAGNOSIS — T148XXA Other injury of unspecified body region, initial encounter: Secondary | ICD-10-CM

## 2022-06-07 MED ORDER — MUPIROCIN 2 % EX OINT: TOPICAL_OINTMENT | Freq: Three times a day (TID) | CUTANEOUS | 0 refills | Status: DC

## 2022-06-07 MED ORDER — GABAPENTIN 100 MG PO CAPS: ORAL_CAPSULE | ORAL | 1 refills | Status: DC

## 2022-06-08 ENCOUNTER — Encounter: Payer: Self-pay | Admitting: Physical Therapy

## 2022-06-08 ENCOUNTER — Ambulatory Visit: Payer: BC Managed Care – PPO | Admitting: Physical Therapy

## 2022-06-08 DIAGNOSIS — M542 Cervicalgia: Secondary | ICD-10-CM | POA: Diagnosis not present

## 2022-06-08 DIAGNOSIS — M25612 Stiffness of left shoulder, not elsewhere classified: Secondary | ICD-10-CM

## 2022-06-08 NOTE — Therapy (Signed)
OUTPATIENT PHYSICAL THERAPY CERVICAL TREATMENT   Patient Name: Denise Arnold MRN: [id number] DOB:[birth year], 61 y.o., female Today's Date: 06/08/2022  END OF SESSION:  PT End of Session - 06/08/22 1344     Visit Number 2    Date for PT Re-Evaluation 07/01/22    Authorization - Visit Number 2    Progress Note Due on Visit 10    PT Start Time 6122    Activity Tolerance Patient tolerated treatment well;No increased pain    Behavior During Therapy WFL for tasks assessed/performed             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HCC)    Scleroderma ([place])    positive FANA, positive SCL-70 abs, raynauds, mild pulmonary hypertension, normal DLCO   Tricuspid regurgitation    Vitamin D deficiency    Past Surgical History:  Procedure Laterality Date   CESAREAN SECTION  1994   COLONOSCOPY WITH PROPOFOL N/A 05/29/2015   Procedure: COLONOSCOPY WITH PROPOFOL;  Surgeon: Manya Silvas, MD;  Location: [place];  Service: Endoscopy;  Laterality: N/A;   DILATION AND CURETTAGE OF UTERUS  1992   DILATION AND CURETTAGE OF UTERUS  1993   DILATION AND CURETTAGE OF UTERUS  2011   VEIN SURGERY     Patient Active Problem List   Diagnosis Date Noted   Soft tissue lesion of elbow region 05/30/2022   Sinusitis 03/27/2022   Finger ulcer ([place]) 01/16/2022   Breast pain, left 01/16/2022   Chest tightness 12/26/2021   Urinary urgency 10/11/2021   Headache 06/22/2021   Anxiety 06/06/2021   Palpitations 05/28/2021   Diarrhea 05/25/2021   Fatigue 05/25/2021   Vaginal pain 03/03/2019   Axillary fullness 01/13/2019   UTI (urinary tract infection) 10/27/2018   Cellulitis 01/16/2018   Health care maintenance 01/25/2015   History of colonic polyps 04/22/2013   Vitamin D deficiency 01/15/2013   Degenerative cervical disc 01/15/2013   GERD  (gastroesophageal reflux disease) 01/15/2013   Scleroderma ([employer]) 07/17/2012   Raynauds phenomenon 07/17/2012    PCP: Einar Pheasant, MD  REFERRING PROVIDER: McLean-Scocuzza, Nino Glow, MD  REFERRING DIAG:  M54.2 (ICD-10-CM) - Cervicalgia  M25.511,M25.512 (ICD-10-CM) - Acute pain of both shoulders    THERAPY DIAG:  Cervicalgia  Shoulder joint stiffness, bilateral  Rationale for Evaluation and Treatment: Rehabilitation  ONSET DATE: Insidious onset  SUBJECTIVE:  SUBJECTIVE STATEMENT: Pt reports a 4/10 soreness after performing HEP. Pt reports hard to find time for HEP. Pt also reports some soreness in UEs after previous session.   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 Pt is also suffering from scleroderma, reports this could be a factor in current condition and lack of extension of fingers is demonstrated. Pt reports waking up most mornings feeling tightness in cervical spine and bilateral shoulder regions. Pt is receiving a mouth guard in the next couple of weeks to combat grinding teeth habit at night, pt hopes this will help with tension in neck and shoulders. 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 Pt also reports participating in a few other classes at the [facility] as well. Pt reports that this tightness of upper back does not get worse with these exercise classes but pushups are uncomfortable. PAIN:  Are you having pain? Yes: NPRS scale: TBA/10 Pain location: shoulders up into low neck Pain description: tightness Aggravating factors: Stretching out at the end of a workout,  downward dog Relieving factors: stretching, supine rolling exercises  PRECAUTIONS: None  WEIGHT BEARING RESTRICTIONS: No  FALLS:  Has patient fallen in last 6 months? 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   OBJECTIVE:   DIAGNOSTIC FINDINGS:  EXAM: CERVICAL SPINE - COMPLETE 4+ VIEW   COMPARISON:  01/02/2013   FINDINGS: Osseous demineralization.   Vertebral body heights maintained with scattered mild disc space narrowing.   Minor facet degenerative changes.   Bony foramina incompletely profiled.   No fracture, subluxation, or bone destruction.   Prevertebral soft tissues normal thickness.   Lung apices clear.   IMPRESSION: Mild degenerative disc and facet disease changes cervical spine with osseous demineralization.   No acute abnormalities.  EXAM: LEFT SHOULDER - 3 VIEW   COMPARISON:  None Available.   FINDINGS: There is no evidence of fracture or dislocation. There is no evidence of arthropathy or other focal bone abnormality. Visualized left lung is clear. Soft tissues are unremarkable.   IMPRESSION: Negative.  PATIENT SURVEYS:  FOTO 55/66  COGNITION: Overall cognitive status: Within functional limits for tasks assessed  SENSATION: Not tested  POSTURE: rounded shoulders and forward head  PALPATION: Reproduction of symptoms during CPA's of C6-T1. Moderate joint stiffness noted throughout these segments as well. UPA's of these segments also reproduced symptoms but not as much as CPA's. Limitations of bilateral glenohumeral joint noted with limitations in ROM. Symmetrical musculature noted for parascapular musculature and upper traps, moderate  tightness of upper trapezius and parascapular musculature.    CERVICAL ROM:   Active ROM A/PROM (deg) eval  Flexion WFL  Extension WFL  Right lateral flexion 15  Left lateral flexion 16  Right rotation 40  Left rotation 40   (Blank rows = not tested)  UPPER EXTREMITY ROM:  Active ROM Right eval Left eval  Shoulder flexion [facility]  Shoulder extension WFL, pain with OP WFL  Shoulder abduction [facility]  Shoulder adduction WFL, pain WFL, pain      Shoulder  internal rotation 60 pain w OP 79  Shoulder external rotation 76 88  Elbow flexion    Elbow extension    Wrist flexion    Wrist extension    Wrist ulnar deviation    Wrist radial deviation    Wrist pronation    Wrist supination     (Blank rows = not tested)  UPPER EXTREMITY MMT:  MMT Right eval Left eval  Shoulder flexion 4 4  Shoulder extension 4- 4-  Shoulder abduction 4 4  Shoulder adduction        Shoulder internal rotation    Shoulder external rotation    Middle trapezius 4 4  Lower trapezius 4 4  Elbow flexion    Elbow extension    Wrist flexion    Wrist extension    Wrist ulnar deviation    Wrist radial deviation    Wrist pronation    Wrist supination    Grip strength     (Blank rows = not tested)     TODAY'S TREATMENT:                                                                                                                                 UE bike 5 minutes level 4, 2.5 minutes forward and backwards Theraball roll up the wall with UE liftoffs with opposite Cervical rotations 3 sets of 5 reps; pt requires cues and demonstration for correct performance Quadruped UE liftoffs 10 reps bilaterally; pt cued for core activation Quadruped opposite UE and LE liftoffs 10 per side Prone on thera ball Y's with 3 pound Dbs; 2 sets of 10 reps DNF training, 3 trials; 17.5, 17.6, 23.6 seconds; pt cued for targeting deep neck flexors KB 10 pounds upright Row, 2 sets of 8 reps; pt cued for scapular  retraction Seated on theraball 8 cycles horizontal and two diagonals x2; pt cued for equal movement of bilateral UEs for more symmetry Upper Trap Stretch, 1 hold, 30 seconds bilaterally; pt cued for proper stabilization of ipsilateral shoulder. Levator Scapulae Stretch, 1 hold, 30 seconds bilaterally; pt cued for proper stabilization of ipsilateral scapula   PATIENT EDUCATION:  Education details: Patient was educated on diagnosis, anatomy and pathology involved, prognosis, role of PT, and was given an HEP, demonstrating exercise with proper form following verbal and tactile cues, and was given a paper hand out to continue exercise at home. Pt was educated on and agreed to plan of care.  Person educated: Patient Education method: Explanation, Demonstration, Tactile cues, Verbal cues, and Handouts Education comprehension: verbalized understanding, returned demonstration, verbal cues required, and tactile cues required  HOME EXERCISE PROGRAM: Cervical Rotation Snag 3 sets 12-20 reps Sleeper's internal rotation stretch for R shoulder 3 sets of 30 second holds Thoracic extensions with hands behind head, 3 sets of 12-20 reps   ASSESSMENT:  CLINICAL IMPRESSION: PT initiated therapeutic exercises to improve mobility of scapulothoracic and cervicothoracic articulations as well as  musculature of cervical spine. Pt demonstrated ability to perform advanced balance/stabilizing exercises with minimal difficulty. Pt requires minimal breaks in between sets and exercises. Pt reports 5/10 soreness after treatment today but feels that it is not a bad soreness just as though the muscle are fatigued as they would be after a good workout. Patient will continue to benefit from skilled physical therapy to improve strength and mobility of bilateral glenohumeral joints and cervical spine to increased independence with more advanced overhead activities and return to prior level of function.    OBJECTIVE IMPAIRMENTS:  decreased mobility, decreased ROM, decreased strength, and hypomobility.   ACTIVITY LIMITATIONS: lifting, bathing, and reach over head  PARTICIPATION LIMITATIONS: community activity and occupation  PERSONAL FACTORS: Age, Fitness, Profession, and 1 comorbidity: Scleroderma  are also affecting patient's functional outcome.   REHAB POTENTIAL: Good  CLINICAL DECISION MAKING: Evolving/moderate complexity  EVALUATION COMPLEXITY: Moderate   GOALS: Goals reviewed with patient? No  SHORT TERM GOALS: Target date: 07/01/2022  Pt will be independent with HEP in order to improve strength and balance in order to decrease fall risk and improve function at home and work.  Baseline: HEP given 06/03/2022 Goal status: INITIAL   LONG TERM GOALS: Target date: 07/29/2021  Patient will increase FOTO score to 66 to demonstrate predicted increase in functional mobility to complete ADLs.  Baseline: 55 Goal status: INITIAL  2.  Pt will demonstrate  and increase to at least 20 degrees of lateral flexion on both sides for improved community ambulation. Baseline: 06/03/2022: L:16 R:15 Goal status: INITIAL  3.  Pt will increase all UE and parascapular MMT's to a level of at least 4+ for increased independence with ADL and improved level of function during exercise classes. Baseline: See eval MMT chart Goal status: INITIAL  4. Pt will report waking up with out upper back tightness/pain 4 out of 7 days for increased QoL.  Baseline: 06/03/2022: almost every morning  Goal status: INITIAL   PLAN:  PT FREQUENCY: 1-2x/week  PT DURATION: 8 weeks  PLANNED INTERVENTIONS: Therapeutic exercises, Therapeutic activity, Neuromuscular re-education, Balance training, Gait training, Patient/Family education, Self Care, Joint mobilization, Dry Needling, and Manual therapy  PLAN FOR NEXT SESSION: DNF    Durwin Reges DPT  Mayo Ao, Student-PT 06/08/2022, 1:45 PM

## 2022-06-15 ENCOUNTER — Encounter: Payer: Self-pay | Admitting: Physical Therapy

## 2022-06-15 ENCOUNTER — Ambulatory Visit: Payer: BC Managed Care – PPO | Admitting: Physical Therapy

## 2022-06-15 DIAGNOSIS — M25611 Stiffness of right shoulder, not elsewhere classified: Secondary | ICD-10-CM

## 2022-06-15 DIAGNOSIS — M542 Cervicalgia: Secondary | ICD-10-CM

## 2022-06-15 NOTE — Therapy (Unsigned)
OUTPATIENT PHYSICAL THERAPY CERVICAL TREATMENT   Patient Name: Denise Arnold MRN: [id number] DOB:25-Oct-1960, 61 y.o., female Today's Date: 06/16/2022  END OF SESSION:  PT End of Session - 06/15/22 1302     Visit Number 3    Date for PT Re-Evaluation 07/01/22    Authorization - Visit Number 3    Progress Note Due on Visit 10    PT Start Time 1300    PT Stop Time 1342    PT Time Calculation (min) 42 min    Activity Tolerance Patient tolerated treatment well;No increased pain    Behavior During Therapy WFL for tasks assessed/performed              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HCC)    Scleroderma ([place])    positive FANA, positive SCL-70 abs, raynauds, mild pulmonary hypertension, normal DLCO   Tricuspid regurgitation    Vitamin D deficiency    Past Surgical History:  Procedure Laterality Date   CESAREAN SECTION  1994   COLONOSCOPY WITH PROPOFOL N/A 05/29/2015   Procedure: COLONOSCOPY WITH PROPOFOL;  Surgeon: Manya Silvas, MD;  Location: [place];  Service: Endoscopy;  Laterality: N/A;   DILATION AND CURETTAGE OF UTERUS  1992   DILATION AND CURETTAGE OF UTERUS  1993   DILATION AND CURETTAGE OF UTERUS  2011   VEIN SURGERY     Patient Active Problem List   Diagnosis Date Noted   Soft tissue lesion of elbow region 05/30/2022   Sinusitis 03/27/2022   Finger ulcer ([place]) 01/16/2022   Breast pain, left 01/16/2022   Chest tightness 12/26/2021   Urinary urgency 10/11/2021   Headache 06/22/2021   Anxiety 06/06/2021   Palpitations 05/28/2021   Diarrhea 05/25/2021   Fatigue 05/25/2021   Vaginal pain 03/03/2019   Axillary fullness 01/13/2019   UTI (urinary tract infection) 10/27/2018   Cellulitis 01/16/2018   Health care maintenance 01/25/2015   History of colonic polyps 04/22/2013   Vitamin D deficiency 01/15/2013    Degenerative cervical disc 01/15/2013   GERD (gastroesophageal reflux disease) 01/15/2013   Scleroderma ([place]) 07/17/2012   Raynauds phenomenon 07/17/2012    PCP: Einar Pheasant, MD  REFERRING PROVIDER: McLean-Scocuzza, Nino Glow, MD  REFERRING DIAG:  M54.2 (ICD-10-CM) - Cervicalgia  M25.511,M25.512 (ICD-10-CM) - Acute pain of both shoulders    THERAPY DIAG:  Cervicalgia  Shoulder joint stiffness, bilateral  Rationale for Evaluation and Treatment: Rehabilitation  ONSET DATE: Insidious onset  SUBJECTIVE:  SUBJECTIVE STATEMENT: Pt reports 8/10 soreness after last session and thinks 20 pound dumbbell was too much last treatment. Pt reports getting brace after getting a crown next week for the clinching at teeth at night. She feels this is adding to the tension in her neck.  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 Pt is also suffering from scleroderma, reports this could be a factor in current condition and lack of extension of fingers is demonstrated. Pt reports waking up most mornings feeling tightness in cervical spine and bilateral shoulder regions. Pt is receiving a mouth guard in the next couple of weeks to combat grinding teeth habit at night, pt hopes this will help with tension in neck and shoulders. 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 Pt also reports participating in a few other classes at the [facility] as well. Pt reports that this tightness of upper Arnold does not get worse with these exercise classes but pushups are uncomfortable. PAIN:  Are you having pain? Yes: NPRS  scale: TBA/10 Pain location: shoulders up into low neck Pain description: tightness Aggravating factors: Stretching out at the end of a workout, downward dog Relieving factors: stretching, supine rolling exercises  PRECAUTIONS: None  WEIGHT BEARING RESTRICTIONS: No  FALLS:  Has patient fallen in last 6 months? 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   OBJECTIVE:   DIAGNOSTIC FINDINGS:  EXAM: CERVICAL SPINE - COMPLETE 4+ VIEW   COMPARISON:  01/02/2013   FINDINGS: Osseous demineralization.   Vertebral body heights maintained with scattered mild disc space narrowing.   Minor facet degenerative changes.   Bony foramina incompletely profiled.   No fracture, subluxation, or bone destruction.   Prevertebral soft tissues normal thickness.   Lung apices clear.   IMPRESSION: Mild degenerative disc and facet disease changes cervical spine with osseous demineralization.   No acute abnormalities.  EXAM: LEFT SHOULDER - 3 VIEW   COMPARISON:  None Available.   FINDINGS: There is no evidence of fracture or dislocation. There is no evidence of arthropathy or other focal bone abnormality. Visualized left lung is clear. Soft tissues are unremarkable.   IMPRESSION: Negative.  PATIENT SURVEYS:  FOTO 55/66  COGNITION: Overall cognitive status: Within functional limits for tasks assessed  SENSATION: Not tested  POSTURE: rounded shoulders and forward head  PALPATION: Reproduction of symptoms during CPA's of C6-T1. Moderate joint stiffness noted throughout these segments as well. UPA's of these segments also reproduced symptoms but not as much as CPA's. Limitations  of bilateral glenohumeral joint noted with limitations in ROM. Symmetrical musculature noted for parascapular musculature and upper traps, moderate tightness of upper trapezius and parascapular musculature.    CERVICAL ROM:   Active ROM A/PROM (deg) eval  Flexion WFL  Extension WFL  Right lateral flexion 15  Left lateral flexion 16  Right rotation 40  Left rotation 40   (Blank rows = not tested)  UPPER EXTREMITY ROM:  Active ROM Right eval Left eval  Shoulder flexion [facility]  Shoulder extension WFL, pain with OP [facility]  Shoulder abduction [facility]  Shoulder adduction WFL, pain WFL, pain      Shoulder internal rotation 60 pain w OP 79  Shoulder external rotation 76 88  Elbow flexion    Elbow extension    Wrist flexion    Wrist extension    Wrist ulnar deviation    Wrist radial deviation    Wrist pronation    Wrist supination     (Blank rows = not tested)  UPPER EXTREMITY MMT:  MMT Right eval Left eval  Shoulder flexion 4 4  Shoulder extension 4- 4-  Shoulder abduction 4 4  Shoulder adduction        Shoulder internal rotation    Shoulder external rotation    Middle trapezius 4 4  Lower trapezius 4 4  Elbow flexion    Elbow extension    Wrist flexion    Wrist extension    Wrist ulnar deviation    Wrist radial deviation    Wrist pronation    Wrist supination    Grip strength     (Blank rows = not tested)     TODAY'S TREATMENT:                                                                                                                                 UE bike 5 minutes level 4 Theraball roll up the wall with UE liftoffs with opposite Cervical rotations 2 sets of 5 reps; pt requires cues and demonstration for correct performance Supermans, 5 second holds, 2 sets of 8 reps; pt cued for scapular retraction Quadruped thread the needle 1 set of 10 bilaterally; pt cued for max cervical rotation bilaterally Overhead press with 4 pound dumbbells 3 sets of 10  reps; pt cued for wider BOS Quadruped UE liftoffs with elbow to knee touch 10 reps bilaterally; pt cued for max cervical extension and flexion Standing scaption with 4 pound DB 3 sets of 10 reps; pt requires cueing for scapular depression and retraction Upward chop motion with DB slow descent with eccentric control with 5 pound dumbbell Bodyblade horizontal hold in 90 degree flexion 2, 30 second reps; pt cued to use proximal musculature DB Press with scapular protraction, 4 pounds 2 sets of 8 reps Upper Trap Stretch, 1 hold, 30 seconds bilaterally; pt cued for proper stabilization of ipsilateral shoulder. Levator Scapulae Stretch, 1 hold, 30 seconds bilaterally; pt cued for proper stabilization of ipsilateral scapula   PATIENT EDUCATION:  Education details: Patient was educated on diagnosis, anatomy and pathology involved, prognosis, role of PT, and was given an HEP, demonstrating exercise with proper form following verbal and tactile cues, and was given a paper hand out to continue exercise at home. Pt was educated on and agreed to plan of care.  Person educated: Patient Education method: Explanation, Demonstration, Tactile cues, Verbal cues, and Handouts Education comprehension: verbalized understanding, returned demonstration, verbal cues required, and tactile cues required  HOME  EXERCISE PROGRAM: Cervical Rotation Snag 3 sets 12-20 reps Sleeper's internal rotation stretch for R shoulder 3 sets of 30 second holds Thoracic extensions with hands behind head, 3 sets of 12-20 reps   ASSESSMENT:  CLINICAL IMPRESSION:  PT continued therapeutic exercises to improve mobility of scapulothoracic and cervicothoracic regions. Pt able to initiate unfamiliar exercises with verbal cues and demonstration. Pt shows good effort throughout session and does not require rest breaks between exercises. Pt voices interest in what goals of certain exercises are to achieve. Patient will continue to benefit from  skilled physical therapy to improve mobility of cervical and thoracic regions for improved community ambulation and independence with ADLs.     OBJECTIVE IMPAIRMENTS: decreased mobility, decreased ROM, decreased strength, and hypomobility.   ACTIVITY LIMITATIONS: lifting, bathing, and reach over head  PARTICIPATION LIMITATIONS: community activity and occupation  PERSONAL FACTORS: Age, Fitness, Profession, and 1 comorbidity: Scleroderma  are also affecting patient's functional outcome.   REHAB POTENTIAL: Good  CLINICAL DECISION MAKING: Evolving/moderate complexity  EVALUATION COMPLEXITY: Moderate   GOALS: Goals reviewed with patient? No  SHORT TERM GOALS: Target date: 07/01/2022  Pt will be independent with HEP in order to improve strength and balance in order to decrease fall risk and improve function at home and work.  Baseline: HEP given 06/03/2022 Goal status: INITIAL   LONG TERM GOALS: Target date: 07/29/2021  Patient will increase FOTO score to 66 to demonstrate predicted increase in functional mobility to complete ADLs.  Baseline: 55 Goal status: INITIAL  2.  Pt will demonstrate  and increase to at least 20 degrees of lateral flexion on both sides for improved community ambulation. Baseline: 06/03/2022: L:16 R:15 Goal status: INITIAL  3.  Pt will increase all UE and parascapular MMT's to a level of at least 4+ for increased independence with ADL and improved level of function during exercise classes. Baseline: See eval MMT chart Goal status: INITIAL  4. Pt will report waking up with out upper Arnold tightness/pain 4 out of 7 days for increased QoL.  Baseline: 06/03/2022: almost every morning  Goal status: INITIAL   PLAN:  PT FREQUENCY: 1-2x/week  PT DURATION: 8 weeks  PLANNED INTERVENTIONS: Therapeutic exercises, Therapeutic activity, Neuromuscular re-education, Balance training, Gait training, Patient/Family education, Self Care, Joint mobilization, Dry  Needling, and Manual therapy  PLAN FOR NEXT SESSION: Manual PPIVM of cervical spine    Durwin Reges DPT  Durwin Reges, PT 06/16/2022, 12:17 PM

## 2022-06-16 ENCOUNTER — Encounter: Payer: Self-pay | Admitting: Physical Therapy

## 2022-06-17 ENCOUNTER — Encounter: Payer: Self-pay | Admitting: Physical Therapy

## 2022-06-17 ENCOUNTER — Encounter: Payer: BC Managed Care – PPO | Admitting: Physical Therapy

## 2022-06-17 ENCOUNTER — Ambulatory Visit: Payer: BC Managed Care – PPO | Attending: Internal Medicine | Admitting: Physical Therapy

## 2022-06-17 ENCOUNTER — Ambulatory Visit (INDEPENDENT_AMBULATORY_CARE_PROVIDER_SITE_OTHER): Payer: BC Managed Care – PPO | Admitting: Internal Medicine

## 2022-06-17 ENCOUNTER — Other Ambulatory Visit (HOSPITAL_COMMUNITY)
Admission: RE | Admit: 2022-06-17 | Discharge: 2022-06-17 | Disposition: A | Discharge status: Home / Self Care | Payer: BC Managed Care – PPO | Source: Ambulatory Visit | Attending: Internal Medicine | Admitting: Internal Medicine

## 2022-06-17 ENCOUNTER — Ambulatory Visit (INDEPENDENT_AMBULATORY_CARE_PROVIDER_SITE_OTHER): Payer: BC Managed Care – PPO

## 2022-06-17 ENCOUNTER — Encounter: Payer: Self-pay | Admitting: Internal Medicine

## 2022-06-17 VITALS — BP 118/70 | HR 73 | Temp 98.2°F | Ht 64.5 in | Wt 166.6 lb

## 2022-06-17 DIAGNOSIS — M25612 Stiffness of left shoulder, not elsewhere classified: Secondary | ICD-10-CM | POA: Insufficient documentation

## 2022-06-17 DIAGNOSIS — F419 Anxiety disorder, unspecified: Secondary | ICD-10-CM | POA: Diagnosis not present

## 2022-06-17 DIAGNOSIS — K2100 Gastro-esophageal reflux disease with esophagitis, without bleeding: Secondary | ICD-10-CM

## 2022-06-17 DIAGNOSIS — Z23 Encounter for immunization: Secondary | ICD-10-CM | POA: Diagnosis not present

## 2022-06-17 DIAGNOSIS — M25611 Stiffness of right shoulder, not elsewhere classified: Secondary | ICD-10-CM | POA: Insufficient documentation

## 2022-06-17 DIAGNOSIS — M25562 Pain in left knee: Secondary | ICD-10-CM

## 2022-06-17 DIAGNOSIS — Z0000 Encounter for general adult medical examination without abnormal findings: Secondary | ICD-10-CM | POA: Insufficient documentation

## 2022-06-17 DIAGNOSIS — M799 Soft tissue disorder, unspecified: Secondary | ICD-10-CM

## 2022-06-17 DIAGNOSIS — M542 Cervicalgia: Secondary | ICD-10-CM | POA: Insufficient documentation

## 2022-06-17 DIAGNOSIS — M349 Systemic sclerosis, unspecified: Secondary | ICD-10-CM

## 2022-06-17 DIAGNOSIS — I7300 Raynaud's syndrome without gangrene: Secondary | ICD-10-CM

## 2022-06-17 DIAGNOSIS — M5030 Other cervical disc degeneration, unspecified cervical region: Secondary | ICD-10-CM

## 2022-06-17 NOTE — Therapy (Signed)
OUTPATIENT PHYSICAL THERAPY CERVICAL TREATMENT   Patient Name: Denise Arnold MRN: [id number] DOB:1960/12/16, 61 y.o., female Today's Date: 06/17/2022  END OF SESSION:  PT End of Session - 06/17/22 0746     Visit Number 4    Date for PT Re-Evaluation 07/01/22    Authorization - Visit Number 4    Progress Note Due on Visit 10    PT Start Time 0746    PT Stop Time 0829    PT Time Calculation (min) 43 min    Activity Tolerance Patient tolerated treatment well;No increased pain    Behavior During Therapy WFL for tasks assessed/performed               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HCC)    Scleroderma ([place])    positive FANA, positive SCL-70 abs, raynauds, mild pulmonary hypertension, normal DLCO   Tricuspid regurgitation    Vitamin D deficiency    Past Surgical History:  Procedure Laterality Date   CESAREAN SECTION  1994   COLONOSCOPY WITH PROPOFOL N/A 05/29/2015   Procedure: COLONOSCOPY WITH PROPOFOL;  Surgeon: Manya Silvas, MD;  Location: [facility];  Service: Endoscopy;  Laterality: N/A;   DILATION AND CURETTAGE OF UTERUS  1992   DILATION AND CURETTAGE OF UTERUS  1993   DILATION AND CURETTAGE OF UTERUS  2011   VEIN SURGERY     Patient Active Problem List   Diagnosis Date Noted   Soft tissue lesion of elbow region 05/30/2022   Sinusitis 03/27/2022   Finger ulcer ([place]) 01/16/2022   Breast pain, left 01/16/2022   Chest tightness 12/26/2021   Urinary urgency 10/11/2021   Headache 06/22/2021   Anxiety 06/06/2021   Palpitations 05/28/2021   Diarrhea 05/25/2021   Fatigue 05/25/2021   Vaginal pain 03/03/2019   Axillary fullness 01/13/2019   UTI (urinary tract infection) 10/27/2018   Cellulitis 01/16/2018   Health care maintenance 01/25/2015   History of colonic polyps 04/22/2013   Vitamin D deficiency 01/15/2013    Degenerative cervical disc 01/15/2013   GERD (gastroesophageal reflux disease) 01/15/2013   Scleroderma ([place]) 07/17/2012   Raynauds phenomenon 07/17/2012    PCP: Einar Pheasant, MD  REFERRING PROVIDER: McLean-Scocuzza, Nino Glow, MD  REFERRING DIAG:  M54.2 (ICD-10-CM) - Cervicalgia  M25.511,M25.512 (ICD-10-CM) - Acute pain of both shoulders    THERAPY DIAG:  Cervicalgia  Shoulder joint stiffness, bilateral  Rationale for Evaluation and Treatment: Rehabilitation  ONSET DATE: Insidious onset  SUBJECTIVE:  SUBJECTIVE STATEMENT:  Pt reports 5/10 soreness after last session mainly in arms. Reports going to get crown next Thursday. Pt reports doing HEP stretches once per day.  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 Pt is also suffering from scleroderma, reports this could be a factor in current condition and lack of extension of fingers is demonstrated. Pt reports waking up most mornings feeling tightness in cervical spine and bilateral shoulder regions. Pt is receiving a mouth guard in the next couple of weeks to combat grinding teeth habit at night, pt hopes this will help with tension in neck and shoulders. 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 Pt also reports participating in a few other classes at the [employer] as well. Pt reports that this tightness of upper back does not get worse with these exercise classes but pushups are uncomfortable. PAIN:  Are you having pain? Yes: NPRS scale: TBA/10 Pain location: shoulders up into low neck Pain description: tightness Aggravating  factors: Stretching out at the end of a workout, downward dog Relieving factors: stretching, supine rolling exercises  PRECAUTIONS: None  WEIGHT BEARING RESTRICTIONS: No  FALLS:  Has patient fallen in last 6 months? 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   OBJECTIVE:   DIAGNOSTIC FINDINGS:  EXAM: CERVICAL SPINE - COMPLETE 4+ VIEW   COMPARISON:  01/02/2013   FINDINGS: Osseous demineralization.   Vertebral body heights maintained with scattered mild disc space narrowing.   Minor facet degenerative changes.   Bony foramina incompletely profiled.   No fracture, subluxation, or bone destruction.   Prevertebral soft tissues normal thickness.   Lung apices clear.   IMPRESSION: Mild degenerative disc and facet disease changes cervical spine with osseous demineralization.   No acute abnormalities.  EXAM: LEFT SHOULDER - 3 VIEW   COMPARISON:  None Available.   FINDINGS: There is no evidence of fracture or dislocation. There is no evidence of arthropathy or other focal bone abnormality. Visualized left lung is clear. Soft tissues are unremarkable.   IMPRESSION: Negative.  PATIENT SURVEYS:  FOTO 55/66  COGNITION: Overall cognitive status: Within functional limits for tasks assessed  SENSATION: Not tested  POSTURE: rounded shoulders and forward head  PALPATION: Reproduction of symptoms during CPA's of C6-T1. Moderate joint stiffness noted throughout these segments as well. UPA's of these segments also reproduced symptoms but not as much as CPA's. Limitations of bilateral glenohumeral joint noted with limitations in ROM. Symmetrical musculature noted for  parascapular musculature and upper traps, moderate tightness of upper trapezius and parascapular musculature.    CERVICAL ROM:   Active ROM A/PROM (deg) eval  Flexion WFL  Extension WFL  Right lateral flexion 15  Left lateral flexion 16  Right rotation 40  Left rotation 40   (Blank rows = not tested)  UPPER EXTREMITY ROM:  Active ROM Right eval Left eval  Shoulder flexion [facility]  Shoulder extension WFL, pain with OP WFL  Shoulder abduction [facility]  Shoulder adduction WFL, pain WFL, pain      Shoulder internal  rotation 60 pain w OP 79  Shoulder external rotation 76 88  Elbow flexion    Elbow extension    Wrist flexion    Wrist extension    Wrist ulnar deviation    Wrist radial deviation    Wrist pronation    Wrist supination     (Blank rows = not tested)  UPPER EXTREMITY MMT:  MMT Right eval Left eval  Shoulder flexion 4 4  Shoulder extension 4- 4-  Shoulder abduction 4 4  Shoulder adduction        Shoulder internal rotation    Shoulder external rotation    Middle trapezius 4 4  Lower trapezius 4 4  Elbow flexion    Elbow extension    Wrist flexion    Wrist extension    Wrist ulnar deviation    Wrist radial deviation    Wrist pronation    Wrist supination    Grip strength     (Blank rows = not tested)     TODAY'S TREATMENT:                                                                                                                                 UE bike 5 minutes level 4; 2.5 minutes forward and backwards Theraball roll up the wall with UE liftoffs with opposite Cervical rotations 2 sets of 5 reps; pt requires cues and demonstration for correct performance Supermans, 5 second holds, 2 sets of 8 reps; pt cued for scapular retraction Standing open book with green theraball between hip and wall, 1 set of 10 reps bilaterally  Thoracic Extensions seated in green chair 2 sets of 20 reps Quadruped thread the needle 1 set of 10 bilaterally; pt cued  for max cervical rotation bilaterally Quadruped UE liftoffs with elbow to knee touch 10 reps bilaterally; pt cued for max cervical extension and flexion DNF endurance Training: 35 seconds, 27 seconds, 22 seconds; pt cued for chin tuck movement  Upper Trap Stretch, 1 hold, 30 seconds bilaterally; pt cued for proper stabilization of ipsilateral shoulder. Levator Scapulae Stretch, 1 hold, 30 seconds bilaterally; pt cued for proper stabilization of ipsilateral scapula  Manual: CPAs, grade 2-3, C2-C7 5 minutes UPAs, grade 2-3, C2-C7, bilaterally 3 min each OA MET, 2 reps bilaterally    PATIENT EDUCATION:  Education details: Patient was educated on diagnosis, anatomy and pathology involved, prognosis, role of PT, and was given an HEP, demonstrating exercise with proper form following verbal and tactile cues, and was given a paper hand out to continue exercise at home. Pt was educated on and agreed to plan of care.  Person educated: Patient Education method: Explanation, Demonstration, Tactile cues, Verbal cues, and Handouts Education comprehension: verbalized understanding, returned demonstration, verbal cues required, and tactile cues required  HOME EXERCISE PROGRAM: Cervical Rotation Snag 3 sets 12-20 reps Sleeper's internal rotation stretch for R shoulder 3 sets of 30 second holds Thoracic extensions with  hands behind head, 3 sets of 12-20 reps   ASSESSMENT:  CLINICAL IMPRESSION: PT continued therapeutic exercises to improve cervical mobility and upper back region. Pt able to tolerate manual therapy with no increased pain and voiced positive feedback from massage like sensation. Pt able to comply with all therex with minimal cueing for familiar exercises. Pt would rather have had a later session time if possible. Patient will continue to benefit from skilled physical therapy to improve cervical ROM and strength of cervical musculature for improved quality of life and improved independence  with ADL.    OBJECTIVE IMPAIRMENTS: decreased mobility, decreased ROM, decreased strength, and hypomobility.   ACTIVITY LIMITATIONS: lifting, bathing, and reach over head  PARTICIPATION LIMITATIONS: community activity and occupation  PERSONAL FACTORS: Age, Fitness, Profession, and 1 comorbidity: Scleroderma  are also affecting patient's functional outcome.   REHAB POTENTIAL: Good  CLINICAL DECISION MAKING: Evolving/moderate complexity  EVALUATION COMPLEXITY: Moderate   GOALS: Goals reviewed with patient? No  SHORT TERM GOALS: Target date: 07/01/2022  Pt will be independent with HEP in order to improve strength and balance in order to decrease fall risk and improve function at home and work.  Baseline: HEP given 06/03/2022 Goal status: INITIAL   LONG TERM GOALS: Target date: 07/29/2021  Patient will increase FOTO score to 66 to demonstrate predicted increase in functional mobility to complete ADLs.  Baseline: 55 Goal status: INITIAL  2.  Pt will demonstrate  and increase to at least 20 degrees of lateral flexion on both sides for improved community ambulation. Baseline: 06/03/2022: L:16 R:15 Goal status: INITIAL  3.  Pt will increase all UE and parascapular MMT's to a level of at least 4+ for increased independence with ADL and improved level of function during exercise classes. Baseline: See eval MMT chart Goal status: INITIAL  4. Pt will report waking up with out upper back tightness/pain 4 out of 7 days for increased QoL.  Baseline: 06/03/2022: almost every morning  Goal status: INITIAL   PLAN:  PT FREQUENCY: 1-2x/week  PT DURATION: 8 weeks  PLANNED INTERVENTIONS: Therapeutic exercises, Therapeutic activity, Neuromuscular re-education, Balance training, Gait training, Patient/Family education, Self Care, Joint mobilization, Dry Needling, and Manual therapy  PLAN FOR NEXT SESSION: Manual PPIVM of cervical spine    Durwin Reges DPT  Durwin Reges,  PT 06/17/2022, 10:48 AM

## 2022-06-17 NOTE — Assessment & Plan Note (Signed)
Physical today 06/17/22.  PAP 06/17/22.  Mammogram 01/31/22 - Birads I.  Colonoscopy 03/2021.  Recommended f/u in 10 years.

## 2022-06-17 NOTE — Progress Notes (Unsigned)
Patient ID: Denise Arnold, female   DOB: [birth year], 61 y.o.   MRN: [id number]   Subjective:    Patient ID: Denise Arnold, female    DOB: 1960-11-25, 61 y.o.   MRN: [id number]  This visit occurred during the SARS-CoV-2 public health emergency.  Safety protocols were in place, including screening questions prior to the visit, additional usage of staff PPE, and extensive cleaning of exam room while observing appropriate contact time as indicated for disinfecting solutions.   Patient here for  Chief Complaint  Patient presents with   Annual Exam   .   HPI    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HCC)    Scleroderma ([place])    positive FANA, positive SCL-70 abs, raynauds, mild pulmonary hypertension, normal DLCO   Tricuspid regurgitation    Vitamin D deficiency    Past Surgical History:  Procedure Laterality Date   CESAREAN SECTION  1994   COLONOSCOPY WITH PROPOFOL N/A 05/29/2015   Procedure: COLONOSCOPY WITH PROPOFOL;  Surgeon: Manya Silvas, MD;  Location: [facility];  Service: Endoscopy;  Laterality: N/A;   DILATION AND CURETTAGE OF UTERUS  1992   DILATION AND CURETTAGE OF UTERUS  1993   DILATION AND CURETTAGE OF UTERUS  2011   VEIN SURGERY     Family History  Problem Relation Age of Onset   Heart disease Father        myocardial infarction   Arthritis/Rheumatoid Father    Hypertension Father    Hyperlipidemia Mother    Breast cancer Maternal Grandmother    Colon cancer Neg Hx    Social History   Socioeconomic History   Marital status: Married    Spouse name: Not on file   Number of children: 1   Years of education: Not on file   Highest education level: Not on file  Occupational History   Not on file  Tobacco Use   Smoking status: Never   Smokeless tobacco: Never  Vaping Use   Vaping Use: Never used  Substance and Sexual  Activity   Alcohol use: No    Alcohol/week: 0.0 standard drinks of alcohol   Drug use: No   Sexual activity: Not on file  Other Topics Concern   Not on file  Social History Narrative   Not on file   Social Determinants of Health   Financial Resource Strain: Not on file  Food Insecurity: Not on file  Transportation Needs: Not on file  Physical Activity: Not on file  Stress: Not on file  Social Connections: Not on file     Review of Systems     Objective:     BP 118/70   Pulse 73   Temp 98.2 F (36.8 C) (Oral)   Ht 5' 4.5" (1.638 m)   Wt 166 lb 9.6 oz (75.6 kg)   LMP 02/14/2010   SpO2 96%   BMI 28.16 kg/m  Wt Readings from Last 3 Encounters:  06/17/22 166 lb 9.6 oz (75.6 kg)  05/25/22 166 lb 3.2 oz (75.4 kg)  04/21/22 166 lb 3.2 oz (75.4 kg)    Physical Exam   Outpatient Encounter Medications as of 06/17/2022  Medication Sig   azelastine (ASTELIN) 0.1 % nasal spray PLACE 1 SPRAY INTO BOTH NOSTRILS 2 (TWO) TIMES DAILY. USE IN EACH NOSTRIL AS DIRECTED  Biotin 5000 MCG TABS    Calcium-Magnesium-Vitamin D (CALCIUM 500 PO) Take by mouth.   Cholecalciferol (VITAMIN D) 50 MCG (2000 UT) tablet Take 2,000 Units by mouth daily.   clobetasol cream (TEMOVATE) 0.05 % Apply to elbows twice a day as needed. Avoid face, groin, axilla.   Cranberry 400 MG CAPS Take by mouth.   fish oil-omega-3 fatty acids 1000 MG capsule Take 1 g by mouth daily.   gabapentin (NEURONTIN) 100 MG capsule Take 2 capsules by mouth at bedtime   MISC NATURAL PRODUCTS PO Herbal Supplement   Multiple Vitamin (MULTIVITAMIN) tablet Take 1 tablet by mouth daily.   mupirocin ointment (BACTROBAN) 2 % Apply topically 3 (three) times daily. Left elbow   nitroGLYCERIN (NITROGLYN) 2 % ointment Apply 0.5 inches topically 3 (three) times daily as needed.    omeprazole (PRILOSEC) 20 MG capsule Take 20 mg by mouth 2 (two) times daily.    Probiotic Product (PROBIOTIC PO) Take by mouth.   RYALTRIS 665-25 MCG/ACT  SUSP INSTILL 2 SPRAYS IN EACH NOSTRIL TWICE DAILY   sildenafil (REVATIO) 20 MG tablet Take by mouth.   tacrolimus (PROTOPIC) 0.1 % ointment Apply to affected areas once to twice a day.   Tavaborole (KERYDIN) 5 % SOLN Apply to affected toenail every night until improved.   No facility-administered encounter medications on file as of 06/17/2022.     Lab Results  Component Value Date   WBC 4.8 12/28/2021   HGB 12.8 12/28/2021   HCT 39.2 12/28/2021   PLT 195.0 12/28/2021   GLUCOSE 86 05/17/2022   CHOL 200 05/17/2022   TRIG 142.0 05/17/2022   HDL 56.50 05/17/2022   LDLCALC 115 (H) 05/17/2022   ALT 11 05/17/2022   AST 17 05/17/2022   NA 141 05/17/2022   K 4.3 05/17/2022   CL 104 05/17/2022   CREATININE 0.84 05/17/2022   BUN 13 05/17/2022   CO2 29 05/17/2022   TSH 1.98 12/28/2021    MM DIAG BREAST TOMO BILATERAL  Result Date: 01/31/2022 CLINICAL DATA:  Patient describes intermittent generalized pain within the upper-outer quadrant of the LEFT breast and axillary tail region. EXAM: DIGITAL DIAGNOSTIC BILATERAL MAMMOGRAM WITH TOMOSYNTHESIS AND CAD TECHNIQUE: Bilateral digital diagnostic mammography and breast tomosynthesis was performed. The images were evaluated with computer-aided detection. COMPARISON:  Previous exam(s). ACR Breast Density Category b: There are scattered areas of fibroglandular density. FINDINGS: There are no new dominant masses, suspicious calcifications or secondary signs of malignancy within either breast. Specifically, there is no new mammographic finding within the upper-outer quadrant of the LEFT breast corresponding to the area of patient's clinical concern. IMPRESSION: No evidence of malignancy within either breast. RECOMMENDATION: 1.  Screening mammogram in one year.(Code:[id number]-B-01Y) 2. Breast pain is a common condition which will often resolve on its own without intervention. Benign causes of breast pain were discussed with the patient. The patient was encouraged to  follow-up with referring physician if the pain persisted or worsened as this might indicate a need to evaluate the deeper structures of the underlying chest wall. Patient was instructed to return for additional imaging if a palpable lump developed. I have discussed the findings and recommendations with the patient. If applicable, a reminder letter will be sent to the patient regarding the next appointment. BI-RADS CATEGORY  1: Negative. Electronically Signed   By: Franki Cabot M.D.   On: 01/31/2022 14:32       Assessment & Plan:   Problem List Items Addressed This Visit   None  Luvina Poirier, MD  

## 2022-06-18 ENCOUNTER — Encounter: Payer: Self-pay | Admitting: Internal Medicine

## 2022-06-18 NOTE — Assessment & Plan Note (Signed)
Going to PT.  Continue

## 2022-06-18 NOTE — Assessment & Plan Note (Signed)
Improved.  Continue bactroban.

## 2022-06-18 NOTE — Assessment & Plan Note (Signed)
No acid reflux reported.  Prilosec.  

## 2022-06-18 NOTE — Assessment & Plan Note (Signed)
Persistent.  No instability of the knee joint.  Check xray.  Further w/up/evaluation pending results.

## 2022-06-18 NOTE — Assessment & Plan Note (Signed)
Followed by rheumatology.  Now seeing Dr Patel and Dr Shaw ([place]).   

## 2022-06-18 NOTE — Assessment & Plan Note (Signed)
Off norvasc/procardia.  Off sildenafill.  Was given rx for cialis.  Has started taking latest rx and is building up dose.

## 2022-06-18 NOTE — Assessment & Plan Note (Signed)
Doing better.  Getting mouthguard soon.  On gabapentin.  Continue.  Follow.

## 2022-06-20 ENCOUNTER — Telehealth: Payer: Self-pay | Admitting: Internal Medicine

## 2022-06-20 ENCOUNTER — Other Ambulatory Visit: Payer: Self-pay

## 2022-06-20 ENCOUNTER — Encounter: Payer: Self-pay | Admitting: Physical Therapy

## 2022-06-20 ENCOUNTER — Ambulatory Visit: Payer: BC Managed Care – PPO

## 2022-06-20 DIAGNOSIS — M25562 Pain in left knee: Secondary | ICD-10-CM

## 2022-06-20 DIAGNOSIS — M542 Cervicalgia: Secondary | ICD-10-CM | POA: Diagnosis not present

## 2022-06-20 NOTE — Therapy (Signed)
OUTPATIENT PHYSICAL THERAPY CERVICAL TREATMENT   Patient Name: Denise Arnold MRN: [id number] DOB:1960-11-15, 61 y.o., female Today's Date: 06/20/2022  END OF SESSION:  PT End of Session - 06/20/22 1349     Visit Number 5    Date for PT Re-Evaluation 07/01/22    Authorization - Visit Number 5    Progress Note Due on Visit 10    PT Start Time 1346    PT Stop Time 1426    PT Time Calculation (min) 40 min    Activity Tolerance Patient tolerated treatment well;No increased pain    Behavior During Therapy WFL for tasks assessed/performed                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HCC)    Scleroderma ([place])    positive FANA, positive SCL-70 abs, raynauds, mild pulmonary hypertension, normal DLCO   Tricuspid regurgitation    Vitamin D deficiency    Past Surgical History:  Procedure Laterality Date   CESAREAN SECTION  1994   COLONOSCOPY WITH PROPOFOL N/A 05/29/2015   Procedure: COLONOSCOPY WITH PROPOFOL;  Surgeon: Manya Silvas, MD;  Location: [facility];  Service: Endoscopy;  Laterality: N/A;   DILATION AND CURETTAGE OF UTERUS  1992   DILATION AND CURETTAGE OF UTERUS  1993   DILATION AND CURETTAGE OF UTERUS  2011   VEIN SURGERY     Patient Active Problem List   Diagnosis Date Noted   Knee pain, left 06/17/2022   Soft tissue lesion of elbow region 05/30/2022   Sinusitis 03/27/2022   Finger ulcer ([place]) 01/16/2022   Breast pain, left 01/16/2022   Chest tightness 12/26/2021   Urinary urgency 10/11/2021   Headache 06/22/2021   Anxiety 06/06/2021   Palpitations 05/28/2021   Diarrhea 05/25/2021   Fatigue 05/25/2021   Vaginal pain 03/03/2019   Axillary fullness 01/13/2019   UTI (urinary tract infection) 10/27/2018   Cellulitis 01/16/2018   Localized swelling, mass and lump, upper limb 01/12/2018   Health care  maintenance 01/25/2015   History of colonic polyps 04/22/2013   Vitamin D deficiency 01/15/2013   Degenerative cervical disc 01/15/2013   GERD (gastroesophageal reflux disease) 01/15/2013   Scleroderma ([place]) 07/17/2012   Raynauds phenomenon 07/17/2012    PCP: Einar Pheasant, MD  REFERRING PROVIDER: McLean-Scocuzza, Nino Glow, MD  REFERRING DIAG:  M54.2 (ICD-10-CM) - Cervicalgia  M25.511,M25.512 (ICD-10-CM) - Acute pain of both shoulders    THERAPY DIAG:  Cervicalgia  Rationale for Evaluation and Treatment: Rehabilitation  ONSET DATE: Insidious onset  SUBJECTIVE:  SUBJECTIVE STATEMENT:  Pt reports 4/10 soreness since last session and presents today. Getting dental work done this week. Reports compliance with HEP once per day. Pt also reports life has been getting more hectic lately with Christmas approaching.  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 Pt is also suffering from scleroderma, reports this could be a factor in current condition and lack of extension of fingers is demonstrated. Pt reports waking up most mornings feeling tightness in cervical spine and bilateral shoulder regions. Pt is receiving a mouth guard in the next couple of weeks to combat grinding teeth habit at night, pt hopes this will help with tension in neck and shoulders. 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 Pt also reports participating in a few other classes at the [facility] as well. Pt reports that this tightness of upper back does not get worse with these exercise classes but pushups are  uncomfortable. PAIN:  Are you having pain? Yes: NPRS scale: TBA/10 Pain location: shoulders up into low neck Pain description: tightness Aggravating factors: Stretching out at the end of a workout, downward dog Relieving factors: stretching, supine rolling exercises  PRECAUTIONS: None  WEIGHT BEARING RESTRICTIONS: No  FALLS:  Has patient fallen in last 6 months? 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   OBJECTIVE:   DIAGNOSTIC FINDINGS:  EXAM: CERVICAL SPINE - COMPLETE 4+ VIEW   COMPARISON:  01/02/2013   FINDINGS: Osseous demineralization.   Vertebral body heights maintained with scattered mild disc space narrowing.   Minor facet degenerative changes.   Bony foramina incompletely profiled.   No fracture, subluxation, or bone destruction.   Prevertebral soft tissues normal thickness.   Lung apices clear.   IMPRESSION: Mild degenerative disc and facet disease changes cervical spine with osseous demineralization.   No acute abnormalities.  EXAM: LEFT SHOULDER - 3 VIEW   COMPARISON:  None Available.   FINDINGS: There is no evidence of fracture or dislocation. There is no evidence of arthropathy or other focal bone abnormality. Visualized left lung is clear. Soft tissues are unremarkable.   IMPRESSION: Negative.  PATIENT SURVEYS:  FOTO 55/66  COGNITION: Overall cognitive status: Within functional limits for tasks assessed  SENSATION: Not tested  POSTURE: rounded shoulders and forward head  PALPATION: Reproduction of symptoms during CPA's of C6-T1. Moderate joint stiffness noted throughout these segments as well. UPA's of these segments also  reproduced symptoms but not as much as CPA's. Limitations of bilateral glenohumeral joint noted with limitations in ROM. Symmetrical musculature noted for parascapular musculature and upper traps, moderate tightness of upper trapezius and parascapular musculature.    CERVICAL ROM:   Active ROM A/PROM (deg) eval  Flexion WFL  Extension WFL  Right lateral flexion 15  Left lateral flexion 16  Right rotation 40  Left rotation 40   (Blank rows = not tested)  UPPER EXTREMITY ROM:  Active ROM Right eval Left eval  Shoulder flexion [facility]  Shoulder extension WFL, pain with OP WFL  Shoulder abduction [facility]  Shoulder adduction  WFL, pain WFL, pain      Shoulder internal rotation 60 pain w OP 79  Shoulder external rotation 76 88  Elbow flexion    Elbow extension    Wrist flexion    Wrist extension    Wrist ulnar deviation    Wrist radial deviation    Wrist pronation    Wrist supination     (Blank rows = not tested)  UPPER EXTREMITY MMT:  MMT Right eval Left eval  Shoulder flexion 4 4  Shoulder extension 4- 4-  Shoulder abduction 4 4  Shoulder adduction        Shoulder internal rotation    Shoulder external rotation    Middle trapezius 4 4  Lower trapezius 4 4  Elbow flexion    Elbow extension    Wrist flexion    Wrist extension    Wrist ulnar deviation    Wrist radial deviation    Wrist pronation    Wrist supination    Grip strength     (Blank rows = not tested)     TODAY'S TREATMENT:                                                                                                                                 UE bike 5 minutes level 4; 2.5 minutes forward and backwards Theraball roll up the wall with UE liftoffs with opposite  Supermans, 5 second holds, 2 sets of 10 reps; pt cued for scapular retraction Side lying thoracic rotations open book with max cervical rotation 1 set of 10 reps bilaterally; pt cued for knee contact on table KB swings, 10 pounds,  2 sets of 10 rep; pt cued for scapular retraction KB upright rows, 10 pounds, 2 sets of 10 reps; pt cued for stopping lift at level of chin Standing open book with green theraball between hip and wall, 1 set of 10 reps bilaterally  Thoracic Extensions seated in green chair 2 sets of 20 reps Quadruped thread the needle 1 set of 10 bilaterally; pt cued for max cervical rotation bilaterally Upper Trap Stretch, 1 hold, 30 seconds bilaterally; pt cued for proper stabilization of ipsilateral shoulder. Levator Scapulae Stretch, 1 hold, 30 seconds bilaterally; pt cued for proper stabilization of ipsilateral scapula    PATIENT EDUCATION:  Education details: Patient was educated on diagnosis, anatomy and pathology involved, prognosis, role of PT, and was given an HEP, demonstrating exercise with proper form following verbal and tactile cues, and was given a paper hand out to continue exercise at home. Pt was educated on and agreed to plan of care.  Person educated: Patient Education method: Explanation, Demonstration, Tactile cues, Verbal cues, and Handouts Education comprehension: verbalized understanding, returned demonstration, verbal cues required, and tactile cues required  HOME EXERCISE PROGRAM: Cervical Rotation Snag 3 sets 12-20 reps Sleeper's internal rotation stretch for R shoulder 3 sets of 30 second holds Thoracic extensions with hands behind head,  3 sets of 12-20 reps   ASSESSMENT:  CLINICAL IMPRESSION: PT continued therapeutic exercises to improve cervical and upper posterior thoracic region mobility and strength deficits. Pt able to comply with all therex with no increased pain and reports side lying cervical/thoracic rotation and stretches feel good. Patient will continue to benefit from skilled physical therapy to improve cervical and thoracic spine musculature strength and endurance for improved QoL and increased independence with ADL.   OBJECTIVE IMPAIRMENTS: decreased mobility,  decreased ROM, decreased strength, and hypomobility.   ACTIVITY LIMITATIONS: lifting, bathing, and reach over head  PARTICIPATION LIMITATIONS: community activity and occupation  PERSONAL FACTORS: Age, Fitness, Profession, and 1 comorbidity: Scleroderma  are also affecting patient's functional outcome.   REHAB POTENTIAL: Good  CLINICAL DECISION MAKING: Evolving/moderate complexity  EVALUATION COMPLEXITY: Moderate   GOALS: Goals reviewed with patient? No  SHORT TERM GOALS: Target date: 07/01/2022  Pt will be independent with HEP in order to improve strength and balance in order to decrease fall risk and improve function at home and work.  Baseline: HEP given 06/03/2022 Goal status: INITIAL   LONG TERM GOALS: Target date: 07/29/2021  Patient will increase FOTO score to 66 to demonstrate predicted increase in functional mobility to complete ADLs.  Baseline: 55 Goal status: INITIAL  2.  Pt will demonstrate  and increase to at least 20 degrees of lateral flexion on both sides for improved community ambulation. Baseline: 06/03/2022: L:16 R:15 Goal status: INITIAL  3.  Pt will increase all UE and parascapular MMT's to a level of at least 4+ for increased independence with ADL and improved level of function during exercise classes. Baseline: See eval MMT chart Goal status: INITIAL  4. Pt will report waking up with out upper back tightness/pain 4 out of 7 days for increased QoL.  Baseline: 06/03/2022: almost every morning  Goal status: INITIAL   PLAN:  PT FREQUENCY: 1-2x/week  PT DURATION: 8 weeks  PLANNED INTERVENTIONS: Therapeutic exercises, Therapeutic activity, Neuromuscular re-education, Balance training, Gait training, Patient/Family education, Self Care, Joint mobilization, Dry Needling, and Manual therapy  PLAN FOR NEXT SESSION: Manual PPIVM of cervical spine    Edison Nasuti A. Laurance Flatten, SPT   Mayo Ao, Student-PT 06/20/2022, 2:25 PM  4:29 PM, 06/20/22 Etta Grandchild, PT, DPT Physical Therapist - [place] [phone] (Office)

## 2022-06-20 NOTE — Telephone Encounter (Signed)
Pt called stating she needs a copy of her xray of her knee for an appointment she has tomorrow

## 2022-06-21 NOTE — Telephone Encounter (Signed)
Per pt - copy left upfront for pt to p/u

## 2022-06-22 ENCOUNTER — Encounter: Payer: Self-pay | Admitting: Physical Therapy

## 2022-06-22 ENCOUNTER — Ambulatory Visit: Payer: BC Managed Care – PPO | Admitting: Physical Therapy

## 2022-06-22 DIAGNOSIS — M542 Cervicalgia: Secondary | ICD-10-CM | POA: Diagnosis not present

## 2022-06-22 NOTE — Therapy (Signed)
OUTPATIENT PHYSICAL THERAPY CERVICAL TREATMENT   Patient Name: Denise Arnold MRN: [id number] DOB:25-Mar-1961, 61 y.o., female Today's Date: 06/22/2022  END OF SESSION:  PT End of Session - 06/22/22 1259     Visit Number 6    Date for PT Re-Evaluation 07/01/22    Authorization - Visit Number 6    Progress Note Due on Visit 10    PT Start Time 1300    PT Stop Time 1343    PT Time Calculation (min) 43 min    Activity Tolerance Patient tolerated treatment well;No increased pain    Behavior During Therapy WFL for tasks assessed/performed                 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HCC)    Scleroderma ([place])    positive FANA, positive SCL-70 abs, raynauds, mild pulmonary hypertension, normal DLCO   Tricuspid regurgitation    Vitamin D deficiency    Past Surgical History:  Procedure Laterality Date   CESAREAN SECTION  1994   COLONOSCOPY WITH PROPOFOL N/A 05/29/2015   Procedure: COLONOSCOPY WITH PROPOFOL;  Surgeon: Manya Silvas, MD;  Location: [facility];  Service: Endoscopy;  Laterality: N/A;   DILATION AND CURETTAGE OF UTERUS  1992   DILATION AND CURETTAGE OF UTERUS  1993   DILATION AND CURETTAGE OF UTERUS  2011   VEIN SURGERY     Patient Active Problem List   Diagnosis Date Noted   Knee pain, left 06/17/2022   Soft tissue lesion of elbow region 05/30/2022   Sinusitis 03/27/2022   Finger ulcer ([place]) 01/16/2022   Breast pain, left 01/16/2022   Chest tightness 12/26/2021   Urinary urgency 10/11/2021   Headache 06/22/2021   Anxiety 06/06/2021   Palpitations 05/28/2021   Diarrhea 05/25/2021   Fatigue 05/25/2021   Vaginal pain 03/03/2019   Axillary fullness 01/13/2019   UTI (urinary tract infection) 10/27/2018   Cellulitis 01/16/2018   Localized swelling, mass and lump, upper limb 01/12/2018   Health care  maintenance 01/25/2015   History of colonic polyps 04/22/2013   Vitamin D deficiency 01/15/2013   Degenerative cervical disc 01/15/2013   GERD (gastroesophageal reflux disease) 01/15/2013   Scleroderma ([place]) 07/17/2012   Raynauds phenomenon 07/17/2012    PCP: Einar Pheasant, MD  REFERRING PROVIDER: McLean-Scocuzza, Nino Glow, MD  REFERRING DIAG:  M54.2 (ICD-10-CM) - Cervicalgia  M25.511,M25.512 (ICD-10-CM) - Acute pain of both shoulders    THERAPY DIAG:  Cervicalgia  Rationale for Evaluation and Treatment: Rehabilitation  ONSET DATE: Insidious onset  SUBJECTIVE:  SUBJECTIVE STATEMENT:  Pt reports compliance with HEP and feels she has improved with therex. 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 Pt is also suffering from scleroderma, reports this could be a factor in current condition and lack of extension of fingers is demonstrated. Pt reports waking up most mornings feeling tightness in cervical spine and bilateral shoulder regions. Pt is receiving a mouth guard in the next couple of weeks to combat grinding teeth habit at night, pt hopes this will help with tension in neck and shoulders. 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 Pt also reports participating in a few other classes at the [facility] as well. Pt reports that this tightness of upper back does not get worse with these exercise classes but pushups are  uncomfortable. PAIN:  Are you having pain? Yes: NPRS scale: TBA/10 Pain location: shoulders up into low neck Pain description: tightness Aggravating factors: Stretching out at the end of a workout, downward dog Relieving factors: stretching, supine rolling exercises  PRECAUTIONS: None  WEIGHT BEARING RESTRICTIONS: No  FALLS:  Has patient fallen in last 6 months? 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   OBJECTIVE:   DIAGNOSTIC FINDINGS:  EXAM: CERVICAL SPINE - COMPLETE 4+ VIEW   COMPARISON:  01/02/2013   FINDINGS: Osseous demineralization.   Vertebral body heights maintained with scattered mild disc space narrowing.   Minor facet degenerative changes.   Bony foramina incompletely profiled.   No fracture, subluxation, or bone destruction.   Prevertebral soft tissues normal thickness.   Lung apices clear.   IMPRESSION: Mild degenerative disc and facet disease changes cervical spine with osseous demineralization.   No acute abnormalities.  EXAM: LEFT SHOULDER - 3 VIEW   COMPARISON:  None Available.   FINDINGS: There is no evidence of fracture or dislocation. There is no evidence of arthropathy or other focal bone abnormality. Visualized left lung is clear. Soft tissues are unremarkable.   IMPRESSION: Negative.  PATIENT SURVEYS:  FOTO 55/66  COGNITION: Overall cognitive status: Within functional limits for tasks assessed  SENSATION: Not tested  POSTURE: rounded shoulders and forward head  PALPATION: Reproduction of symptoms during CPA's of C6-T1. Moderate joint stiffness noted throughout these segments as well. UPA's of these segments also  reproduced symptoms but not as much as CPA's. Limitations of bilateral glenohumeral joint noted with limitations in ROM. Symmetrical musculature noted for parascapular musculature and upper traps, moderate tightness of upper trapezius and parascapular musculature.    CERVICAL ROM:   Active ROM A/PROM (deg) eval  Flexion [facility]  Extension WFL  Right lateral flexion 15  Left lateral flexion 16  Right rotation 40 (60 06/22/2022)  Left rotation 40 (56, 06/22/2022)   (Blank rows = not tested)  UPPER EXTREMITY ROM:  Active ROM Right eval Left eval  Shoulder flexion [facility]  Shoulder extension WFL, pain with OP [facility]  Shoulder  abduction [facility]  Shoulder adduction WFL, pain WFL, pain      Shoulder internal rotation 60 pain w OP 79  Shoulder external rotation 76 88  Elbow flexion    Elbow extension    Wrist flexion    Wrist extension    Wrist ulnar deviation    Wrist radial deviation    Wrist pronation    Wrist supination     (Blank rows = not tested)  UPPER EXTREMITY MMT:  MMT Right eval Left eval  Shoulder flexion 4 4  Shoulder extension 4- 4-  Shoulder abduction 4 4  Shoulder adduction        Shoulder internal rotation    Shoulder external rotation    Middle trapezius 4 4  Lower trapezius 4 4  Elbow flexion    Elbow extension    Wrist flexion    Wrist extension    Wrist ulnar deviation    Wrist radial deviation    Wrist pronation    Wrist supination    Grip strength     (Blank rows = not tested)     TODAY'S TREATMENT:                                                                                                                                 UE bike standing 5 minutes level 5; 2.5 minutes forward and 2.5 minutes backwards; for mobilization of cervical and upper back articulations and musculature Planks with cervical rotations 45 seconds x1; pt cued for max cervical rotations Planks with cervical flexion/extension 45 seconds x1; pt cued for lowering of hips  for increased core activation Quadruped thread the needle 1 set of 10 bilaterally; pt cued for max cervical movement, bilaterally Side lying thoracic rotations open book with max cervical rotation 1 set of 10 reps bilaterally; pt cued for knee contact on table Standing DB shrugs 10 pounds, 2 sets of 10 rep; pt cued for scapular retraction and upright posture Farmer Carries 10 pound DB in each hand, x4 laps (down and back) 46f  Standing overhead weighted ball throws 2 kg with cervical rotations; 1 set of 10 reps; pt cued for soft hands during catch Standing overhead weighted ball throws 3kg ball 2 sets of 10 reps; pt cued for upright posture Seated thoracic rotations in green chair, 10 bilaterally,  Thoracic Extensions seated in green chair 1 sets of 20 reps Upper Trap Stretch, 1 hold, 30 seconds bilaterally; pt cued for proper stabilization of ipsilateral shoulder. Levator Scapulae Stretch, 1 hold, 30 seconds bilaterally; pt cued for proper stabilization of ipsilateral scapula    PATIENT EDUCATION:  Education details: Patient was educated on diagnosis, anatomy and pathology involved, prognosis, role of PT, and was given an HEP, demonstrating exercise with proper form following verbal and tactile cues, and was given a paper hand out to continue exercise at home. Pt was educated on and agreed to plan of care.  Person  educated: Patient Education method: Explanation, Demonstration, Tactile cues, Verbal cues, and Handouts Education comprehension: verbalized understanding, returned demonstration, verbal cues required, and tactile cues required  HOME EXERCISE PROGRAM: Cervical Rotation Snag 3 sets 12-20 reps Sleeper's internal rotation stretch for R shoulder 3 sets of 30 second holds Thoracic extensions with hands behind head, 3 sets of 12-20 reps   ASSESSMENT:  CLINICAL IMPRESSION:  PT continued therapeutic exercises to address deficits in cervical mobility and strength. Pt enjoys weighted  overhead ball throws with cervical movements with good demonstration of hand eye coordination. Pt shows increased AROM in cervical rotation progressing both towards 60 degrees. Patient will continue to benefit from skilled physical therapy to improve cervical mobility and strength for improved community ambulation and return to higher level of function.   OBJECTIVE IMPAIRMENTS: decreased mobility, decreased ROM, decreased strength, and hypomobility.   ACTIVITY LIMITATIONS: lifting, bathing, and reach over head  PARTICIPATION LIMITATIONS: community activity and occupation  PERSONAL FACTORS: Age, Fitness, Profession, and 1 comorbidity: Scleroderma  are also affecting patient's functional outcome.   REHAB POTENTIAL: Good  CLINICAL DECISION MAKING: Evolving/moderate complexity  EVALUATION COMPLEXITY: Moderate   GOALS: Goals reviewed with patient? No  SHORT TERM GOALS: Target date: 07/01/2022  Pt will be independent with HEP in order to improve strength and balance in order to decrease fall risk and improve function at home and work.  Baseline: HEP given 06/03/2022 Goal status: INITIAL   LONG TERM GOALS: Target date: 07/29/2021  Patient will increase FOTO score to 66 to demonstrate predicted increase in functional mobility to complete ADLs.  Baseline: 55 Goal status: INITIAL  2.  Pt will demonstrate  and increase to at least 20 degrees of lateral flexion on both sides for improved community ambulation. Baseline: 06/03/2022: L:16 R:15 Goal status: INITIAL  3.  Pt will increase all UE and parascapular MMT's to a level of at least 4+ for increased independence with ADL and improved level of function during exercise classes. Baseline: See eval MMT chart Goal status: INITIAL  4. Pt will report waking up with out upper back tightness/pain 4 out of 7 days for increased QoL.  Baseline: 06/03/2022: almost every morning  Goal status: INITIAL   PLAN:  PT FREQUENCY: 1-2x/week  PT  DURATION: 8 weeks  PLANNED INTERVENTIONS: Therapeutic exercises, Therapeutic activity, Neuromuscular re-education, Balance training, Gait training, Patient/Family education, Self Care, Joint mobilization, Dry Needling, and Manual therapy  PLAN FOR NEXT SESSION: Manual PPIVM of cervical spine    Edison Nasuti A. Laurance Flatten, SPT   Mayo Ao, Student-PT 06/22/2022, 2:07 PM  2:07 PM, 06/22/22

## 2022-06-23 LAB — CYTOLOGY - PAP
Comment: NEGATIVE
Diagnosis: NEGATIVE
High risk HPV: NEGATIVE

## 2022-06-27 ENCOUNTER — Other Ambulatory Visit: Payer: Self-pay

## 2022-06-27 ENCOUNTER — Encounter: Payer: BC Managed Care – PPO | Admitting: Dermatology

## 2022-06-27 ENCOUNTER — Encounter: Payer: Self-pay | Admitting: Internal Medicine

## 2022-06-27 ENCOUNTER — Encounter: Payer: Self-pay | Admitting: Physical Therapy

## 2022-06-27 ENCOUNTER — Ambulatory Visit: Payer: BC Managed Care – PPO | Admitting: Physical Therapy

## 2022-06-27 DIAGNOSIS — M542 Cervicalgia: Secondary | ICD-10-CM | POA: Diagnosis not present

## 2022-06-27 DIAGNOSIS — M25611 Stiffness of right shoulder, not elsewhere classified: Secondary | ICD-10-CM

## 2022-06-27 MED ORDER — DOXYCYCLINE HYCLATE 100 MG PO TABS: 100.0000 mg | ORAL_TABLET | Freq: Two times a day (BID) | ORAL | 0 refills | Status: DC

## 2022-06-27 NOTE — Therapy (Signed)
OUTPATIENT PHYSICAL THERAPY CERVICAL TREATMENT   Patient Name: Denise Arnold MRN: [id number] DOB:[birth year], 61 y.o., female Today's Date: 06/27/2022  END OF SESSION:  PT End of Session - 06/27/22 1451     Visit Number 7    Date for PT Re-Evaluation 07/01/22    Authorization - Visit Number 7    Progress Note Due on Visit 10    PT Start Time 4580    PT Stop Time 1526    PT Time Calculation (min) 39 min    Activity Tolerance Patient tolerated treatment well;No increased pain    Behavior During Therapy WFL for tasks assessed/performed                  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HCC)    Scleroderma ([place])    positive FANA, positive SCL-70 abs, raynauds, mild pulmonary hypertension, normal DLCO   Tricuspid regurgitation    Vitamin D deficiency    Past Surgical History:  Procedure Laterality Date   CESAREAN SECTION  1994   COLONOSCOPY WITH PROPOFOL N/A 05/29/2015   Procedure: COLONOSCOPY WITH PROPOFOL;  Surgeon: Manya Silvas, MD;  Location: [facility];  Service: Endoscopy;  Laterality: N/A;   DILATION AND CURETTAGE OF UTERUS  1992   DILATION AND CURETTAGE OF UTERUS  1993   DILATION AND CURETTAGE OF UTERUS  2011   VEIN SURGERY     Patient Active Problem List   Diagnosis Date Noted   Knee pain, left 06/17/2022   Soft tissue lesion of elbow region 05/30/2022   Sinusitis 03/27/2022   Finger ulcer ([place]) 01/16/2022   Breast pain, left 01/16/2022   Chest tightness 12/26/2021   Urinary urgency 10/11/2021   Headache 06/22/2021   Anxiety 06/06/2021   Palpitations 05/28/2021   Diarrhea 05/25/2021   Fatigue 05/25/2021   Vaginal pain 03/03/2019   Axillary fullness 01/13/2019   UTI (urinary tract infection) 10/27/2018   Cellulitis 01/16/2018   Localized swelling, mass and lump, upper limb 01/12/2018   Health care  maintenance 01/25/2015   History of colonic polyps 04/22/2013   Vitamin D deficiency 01/15/2013   Degenerative cervical disc 01/15/2013   GERD (gastroesophageal reflux disease) 01/15/2013   Scleroderma ([place]) 07/17/2012   Raynauds phenomenon 07/17/2012    PCP: Einar Pheasant, MD  REFERRING PROVIDER: McLean-Scocuzza, Nino Glow, MD  REFERRING DIAG:  M54.2 (ICD-10-CM) - Cervicalgia  M25.511,M25.512 (ICD-10-CM) - Acute pain of both shoulders    THERAPY DIAG:  Cervicalgia  Shoulder joint stiffness, bilateral  Rationale for Evaluation and Treatment: Rehabilitation  ONSET DATE: Insidious onset  SUBJECTIVE:  SUBJECTIVE STATEMENT:  Pt reports compliance with HEP and feels she has improved with therex. 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 Pt is also suffering from scleroderma, reports this could be a factor in current condition and lack of extension of fingers is demonstrated. Pt reports waking up most mornings feeling tightness in cervical spine and bilateral shoulder regions. Pt is receiving a mouth guard in the next couple of weeks to combat grinding teeth habit at night, pt hopes this will help with tension in neck and shoulders. 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 Pt also reports participating in a few other classes at the [facility] as well. Pt reports that this tightness of upper back does not get worse with these  exercise classes but pushups are uncomfortable. PAIN:  Are you having pain? Yes: NPRS scale: TBA/10 Pain location: shoulders up into low neck Pain description: tightness Aggravating factors: Stretching out at the end of a workout, downward dog Relieving factors: stretching, supine rolling exercises  PRECAUTIONS: None  WEIGHT BEARING RESTRICTIONS: No  FALLS:  Has patient fallen in last 6 months? 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   OBJECTIVE:   DIAGNOSTIC FINDINGS:  EXAM: CERVICAL SPINE - COMPLETE 4+ VIEW   COMPARISON:  01/02/2013   FINDINGS: Osseous demineralization.   Vertebral body heights maintained with scattered mild disc space narrowing.   Minor facet degenerative changes.   Bony foramina incompletely profiled.   No fracture, subluxation, or bone destruction.   Prevertebral soft tissues normal thickness.   Lung apices clear.   IMPRESSION: Mild degenerative disc and facet disease changes cervical spine with osseous demineralization.   No acute abnormalities.  EXAM: LEFT SHOULDER - 3 VIEW   COMPARISON:  None Available.   FINDINGS: There is no evidence of fracture or dislocation. There is no evidence of arthropathy or other focal bone abnormality. Visualized left lung is clear. Soft tissues are unremarkable.   IMPRESSION: Negative.  PATIENT SURVEYS:  FOTO 55/66  COGNITION: Overall cognitive status: Within functional limits for tasks assessed  SENSATION: Not tested  POSTURE: rounded shoulders and forward head  PALPATION: Reproduction of symptoms during CPA's of C6-T1. Moderate joint stiffness noted throughout these segments as well.  UPA's of these segments also reproduced symptoms but not as much as CPA's. Limitations of bilateral glenohumeral joint noted with limitations in ROM. Symmetrical musculature noted for parascapular musculature and upper traps, moderate tightness of upper trapezius and parascapular musculature.    CERVICAL ROM:   Active ROM A/PROM (deg) eval  Flexion [facility]  Extension WFL  Right lateral flexion 15  Left lateral flexion 16  Right rotation 40 (60 06/22/2022)  Left rotation 40 (56, 06/22/2022)   (Blank rows = not tested)  UPPER EXTREMITY ROM:  Active ROM Right eval Left eval  Shoulder flexion [facility]  Shoulder extension WFL, pain with OP [facility]  Shoulder  abduction [facility]  Shoulder adduction WFL, pain WFL, pain      Shoulder internal rotation 60 pain w OP 79  Shoulder external rotation 76 88  Elbow flexion    Elbow extension    Wrist flexion    Wrist extension    Wrist ulnar deviation    Wrist radial deviation    Wrist pronation    Wrist supination     (Blank rows = not tested)  UPPER EXTREMITY MMT:  MMT Right eval Left eval  Shoulder flexion 4 4  Shoulder extension 4- 4-  Shoulder abduction 4 4  Shoulder adduction        Shoulder internal rotation    Shoulder external rotation    Middle trapezius 4 4  Lower trapezius 4 4  Elbow flexion    Elbow extension    Wrist flexion    Wrist extension    Wrist ulnar deviation    Wrist radial deviation    Wrist pronation    Wrist supination    Grip strength     (Blank rows = not tested)     TODAY'S TREATMENT:                                                                                                                                 UE bike standing 5 minutes level 5; 2.5 minutes forward and 2.5 minutes backwards; for mobilization of cervical and upper back articulations and musculature Rotations on wall with ball wedged between hip and wall x12 each direction with good carry over of initial cuing Good morning with  dowel x12 with cuing for bar above head with good carry over Planks with cervical rotations 30sec x2; pt cued for max cervical rotations [employer]one [employer] supermans 2x 12 with min cuing for proper technique with good carry over Ball slam 15# ball 2x 6 with cuing for concordant cervical motion  Cat/Cow x12  Upper Trap Stretch, 1 hold, 30 seconds bilaterally Levator Scapulae Stretch, 1 hold, 30 seconds bilaterally    PATIENT EDUCATION:  Education details: Patient was educated on diagnosis, anatomy and pathology involved, prognosis, role of PT, and was given an HEP, demonstrating exercise with proper form following verbal and tactile cues, and was given a paper hand out to continue exercise at home. Pt was educated on and agreed to plan of care.  Person educated: Patient Education method: Explanation, Demonstration, Tactile cues, Verbal cues, and Handouts Education comprehension: verbalized understanding, returned demonstration, verbal cues required, and tactile cues required  HOME EXERCISE PROGRAM: Cervical Rotation Snag 3 sets 12-20 reps Sleeper's internal rotation stretch for R shoulder 3 sets of 30 second holds Thoracic extensions with hands behind head, 3 sets of 12-20 reps   ASSESSMENT:  CLINICAL IMPRESSION:  PT continued therapeutic exercises to improve cervical, thoracic, and scapulohumeral mobility and strength. PT utilized therex with encouragement for natural, concordant cervical motions with success. Patient is able to comply with all  cuing for proper technique of therex with good effort and no increased pain throughout session. Patient will continue to benefit from skilled physical therapy to improve cervical mobility and strength for improved community ambulation and return to higher level of function.   OBJECTIVE IMPAIRMENTS: decreased mobility, decreased ROM, decreased strength, and hypomobility.   ACTIVITY LIMITATIONS: lifting, bathing, and reach over head  PARTICIPATION  LIMITATIONS: community activity and occupation  PERSONAL FACTORS: Age, Fitness, Profession, and 1 comorbidity: Scleroderma  are also affecting patient's functional outcome.   REHAB POTENTIAL: Good  CLINICAL DECISION MAKING: Evolving/moderate complexity  EVALUATION COMPLEXITY: Moderate   GOALS: Goals reviewed with patient? No  SHORT TERM GOALS: Target date: 07/01/2022  Pt will be independent with HEP in order to improve strength and balance in order to decrease fall risk and improve function at home and work.  Baseline: HEP given 06/03/2022 Goal status: INITIAL   LONG TERM GOALS: Target date: 07/29/2021  Patient will increase FOTO score to 66 to demonstrate predicted increase in functional mobility to complete ADLs.  Baseline: 55 Goal status: INITIAL  2.  Pt will demonstrate  and increase to at least 20 degrees of lateral flexion on both sides for improved community ambulation. Baseline: 06/03/2022: L:16 R:15 Goal status: INITIAL  3.  Pt will increase all UE and parascapular MMT's to a level of at least 4+ for increased independence with ADL and improved level of function during exercise classes. Baseline: See eval MMT chart Goal status: INITIAL  4. Pt will report waking up with out upper back tightness/pain 4 out of 7 days for increased QoL.  Baseline: 06/03/2022: almost every morning  Goal status: INITIAL   PLAN:  PT FREQUENCY: 1-2x/week  PT DURATION: 8 weeks  PLANNED INTERVENTIONS: Therapeutic exercises, Therapeutic activity, Neuromuscular re-education, Balance training, Gait training, Patient/Family education, Self Care, Joint mobilization, Dry Needling, and Manual therapy  PLAN FOR NEXT SESSION: Manual PPIVM of cervical spine    Edison Nasuti A. Laurance Flatten, SPT   Durwin Reges, PT 06/27/2022, 3:29 PM  3:29 PM, 06/27/22

## 2022-06-27 NOTE — Telephone Encounter (Signed)
Doxycycline '100mg'$  bid x 1 week.  Probiotic while on abx and for two weeks after.  Also, needs to be seen if persistent or worsening

## 2022-06-27 NOTE — Telephone Encounter (Signed)
sent 

## 2022-06-29 ENCOUNTER — Encounter: Payer: Self-pay | Admitting: Physical Therapy

## 2022-06-29 ENCOUNTER — Ambulatory Visit: Payer: BC Managed Care – PPO | Admitting: Physical Therapy

## 2022-06-29 DIAGNOSIS — M542 Cervicalgia: Secondary | ICD-10-CM | POA: Diagnosis not present

## 2022-06-29 NOTE — Therapy (Signed)
OUTPATIENT PHYSICAL THERAPY CERVICAL TREATMENT   Patient Name: Denise Arnold MRN: [id number] DOB:May 28, 1961, 61 y.o., female Today's Date: 06/29/2022  END OF SESSION:  PT End of Session - 06/29/22 1309     Visit Number 8    Number of Visits 17    Date for PT Re-Evaluation 07/15/22    Authorization - Visit Number 8    Progress Note Due on Visit 10    PT Start Time 2010    PT Stop Time 1345    PT Time Calculation (min) 40 min    Activity Tolerance Patient tolerated treatment well;No increased pain    Behavior During Therapy WFL for tasks assessed/performed                   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HCC)    Scleroderma ([place])    positive FANA, positive SCL-70 abs, raynauds, mild pulmonary hypertension, normal DLCO   Tricuspid regurgitation    Vitamin D deficiency    Past Surgical History:  Procedure Laterality Date   CESAREAN SECTION  1994   COLONOSCOPY WITH PROPOFOL N/A 05/29/2015   Procedure: COLONOSCOPY WITH PROPOFOL;  Surgeon: Manya Silvas, MD;  Location: [place];  Service: Endoscopy;  Laterality: N/A;   DILATION AND CURETTAGE OF UTERUS  1992   DILATION AND CURETTAGE OF UTERUS  1993   DILATION AND CURETTAGE OF UTERUS  2011   VEIN SURGERY     Patient Active Problem List   Diagnosis Date Noted   Knee pain, left 06/17/2022   Soft tissue lesion of elbow region 05/30/2022   Sinusitis 03/27/2022   Finger ulcer ([place]) 01/16/2022   Breast pain, left 01/16/2022   Chest tightness 12/26/2021   Urinary urgency 10/11/2021   Headache 06/22/2021   Anxiety 06/06/2021   Palpitations 05/28/2021   Diarrhea 05/25/2021   Fatigue 05/25/2021   Vaginal pain 03/03/2019   Axillary fullness 01/13/2019   UTI (urinary tract infection) 10/27/2018   Cellulitis 01/16/2018   Localized swelling, mass and lump, upper limb  01/12/2018   Health care maintenance 01/25/2015   History of colonic polyps 04/22/2013   Vitamin D deficiency 01/15/2013   Degenerative cervical disc 01/15/2013   GERD (gastroesophageal reflux disease) 01/15/2013   Scleroderma ([place]) 07/17/2012   Raynauds phenomenon 07/17/2012    PCP: Einar Pheasant, MD  REFERRING PROVIDER: McLean-Scocuzza, Nino Glow, MD  REFERRING DIAG:  M54.2 (ICD-10-CM) - Cervicalgia  M25.511,M25.512 (ICD-10-CM) - Acute pain of both shoulders    THERAPY DIAG:  Cervicalgia  Rationale for Evaluation and Treatment: Rehabilitation  ONSET DATE: Insidious onset  SUBJECTIVE:  SUBJECTIVE STATEMENT:  Pt reports compliance with HEP and feels she has improved with therex. 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 Pt is also suffering from scleroderma, reports this could be a factor in current condition and lack of extension of fingers is demonstrated. Pt reports waking up most mornings feeling tightness in cervical spine and bilateral shoulder regions. Pt is receiving a mouth guard in the next couple of weeks to combat grinding teeth habit at night, pt hopes this will help with tension in neck and shoulders. 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 Pt also reports participating in a few other classes at the [employer] [facility] as well. Pt reports that this tightness of upper back does not get worse with these exercise classes but  pushups are uncomfortable. PAIN:  Are you having pain? Yes: NPRS scale: TBA/10 Pain location: shoulders up into low neck Pain description: tightness Aggravating factors: Stretching out at the end of a workout, downward dog Relieving factors: stretching, supine rolling exercises  PRECAUTIONS: None  WEIGHT BEARING RESTRICTIONS: No  FALLS:  Has patient fallen in last 6 months? 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   OBJECTIVE:   DIAGNOSTIC FINDINGS:  EXAM: CERVICAL SPINE - COMPLETE 4+ VIEW   COMPARISON:  01/02/2013   FINDINGS: Osseous demineralization.   Vertebral body heights maintained with scattered mild disc space narrowing.   Minor facet degenerative changes.   Bony foramina incompletely profiled.   No fracture, subluxation, or bone destruction.   Prevertebral soft tissues normal thickness.   Lung apices clear.   IMPRESSION: Mild degenerative disc and facet disease changes cervical spine with osseous demineralization.   No acute abnormalities.  EXAM: LEFT SHOULDER - 3 VIEW   COMPARISON:  None Available.   FINDINGS: There is no evidence of fracture or dislocation. There is no evidence of arthropathy or other focal bone abnormality. Visualized left lung is clear. Soft tissues are unremarkable.   IMPRESSION: Negative.  PATIENT SURVEYS:  FOTO 55/66  COGNITION: Overall cognitive status: Within functional limits for tasks assessed  SENSATION: Not tested  POSTURE: rounded shoulders and forward head  PALPATION: Reproduction of symptoms during CPA's of C6-T1. Moderate joint stiffness noted throughout these segments as well. UPA's of these  segments also reproduced symptoms but not as much as CPA's. Limitations of bilateral glenohumeral joint noted with limitations in ROM. Symmetrical musculature noted for parascapular musculature and upper traps, moderate tightness of upper trapezius and parascapular musculature.    CERVICAL ROM:   Active ROM A/PROM (deg) eval  Flexion [facility]  Extension WFL  Right lateral flexion 15  Left lateral flexion 16  Right rotation 40 (60 06/22/2022)  Left rotation 40 (56, 06/22/2022)   (Blank rows = not tested)  UPPER EXTREMITY ROM:  Active ROM Right eval Left eval  Shoulder flexion [facility]  Shoulder extension WFL, pain with OP [facility]  Shoulder  abduction [facility]  Shoulder adduction WFL, pain WFL, pain      Shoulder internal rotation 60 pain w OP 79  Shoulder external rotation 76 88  Elbow flexion    Elbow extension    Wrist flexion    Wrist extension    Wrist ulnar deviation    Wrist radial deviation    Wrist pronation    Wrist supination     (Blank rows = not tested)  UPPER EXTREMITY MMT:  MMT Right eval Left eval  Shoulder flexion 4 4  Shoulder extension 4- 4-  Shoulder abduction 4 4  Shoulder adduction        Shoulder internal rotation    Shoulder external rotation    Middle trapezius 4 4  Lower trapezius 4 4  Elbow flexion    Elbow extension    Wrist flexion    Wrist extension    Wrist ulnar deviation    Wrist radial deviation    Wrist pronation    Wrist supination    Grip strength     (Blank rows = not tested)     TODAY'S TREATMENT:                                                                                                                                 UE bike standing 4 minutes level 5; 2 minutes forward and 2 minutes backwards; for mobilization of cervical and upper back articulations and musculature Seated rotations with dowel overhead x12 with good carry over of technique Good morning with dowel 2x 12 with cuing for bar above head with good carry  over Spider plank 2x 12 with cuing for set up with good carry over Upward wood chop 2x 6 bilat 7# DB (initially attempted 10# too heavy) with good carry over of demo Ball slam 15# ball 2x 6 with cuing for concordant cervical motion  Upper Trap Stretch, 1 hold, 30 seconds bilaterally Levator Scapulae Stretch, 1 hold, 30 seconds bilaterally    PATIENT EDUCATION:  Education details: Patient was educated on diagnosis, anatomy and pathology involved, prognosis, role of PT, and was given an HEP, demonstrating exercise with proper form following verbal and tactile cues, and was given a paper hand out to continue exercise at home. Pt was educated on and agreed to plan of care.  Person educated: Patient Education method: Explanation, Demonstration, Tactile cues, Verbal cues, and Handouts Education comprehension: verbalized understanding, returned demonstration, verbal cues required, and tactile cues required  HOME EXERCISE PROGRAM: Cervical Rotation Snag 3 sets 12-20 reps Sleeper's internal rotation stretch for R shoulder 3 sets of 30 second holds Thoracic extensions with hands behind head, 3 sets of 12-20 reps   ASSESSMENT:  CLINICAL IMPRESSION:  PT continued therapeutic exercises to improve cervical, thoracic, and scapulohumeral mobility and strength. PT utilized therex with encouragement for natural, concordant cervical motions with success. PT increased power/plyometric demand with success.  Patient is able to comply  with all cuing for proper technique of therex with good effort and no increased pain throughout session. Patient will continue to benefit from skilled physical therapy to improve cervical mobility and strength for improved community ambulation and return to higher level of function.   OBJECTIVE IMPAIRMENTS: decreased mobility, decreased ROM, decreased strength, and hypomobility.   ACTIVITY LIMITATIONS: lifting, bathing, and reach over head  PARTICIPATION LIMITATIONS:  community activity and occupation  PERSONAL FACTORS: Age, Fitness, Profession, and 1 comorbidity: Scleroderma  are also affecting patient's functional outcome.   REHAB POTENTIAL: Good  CLINICAL DECISION MAKING: Evolving/moderate complexity  EVALUATION COMPLEXITY: Moderate   GOALS: Goals reviewed with patient? No  SHORT TERM GOALS: Target date: 07/01/2022  Pt will be independent with HEP in order to improve strength and balance in order to decrease fall risk and improve function at home and work.  Baseline: HEP given 06/03/2022 Goal status: INITIAL   LONG TERM GOALS: Target date: 07/29/2021  Patient will increase FOTO score to 66 to demonstrate predicted increase in functional mobility to complete ADLs.  Baseline: 55 Goal status: INITIAL  2.  Pt will demonstrate  and increase to at least 20 degrees of lateral flexion on both sides for improved community ambulation. Baseline: 06/03/2022: L:16 R:15 Goal status: INITIAL  3.  Pt will increase all UE and parascapular MMT's to a level of at least 4+ for increased independence with ADL and improved level of function during exercise classes. Baseline: See eval MMT chart Goal status: INITIAL  4. Pt will report waking up with out upper back tightness/pain 4 out of 7 days for increased QoL.  Baseline: 06/03/2022: almost every morning  Goal status: INITIAL   PLAN:  PT FREQUENCY: 1-2x/week  PT DURATION: 8 weeks  PLANNED INTERVENTIONS: Therapeutic exercises, Therapeutic activity, Neuromuscular re-education, Balance training, Gait training, Patient/Family education, Self Care, Joint mobilization, Dry Needling, and Manual therapy  PLAN FOR NEXT SESSION: Manual PPIVM of cervical spine    Edison Nasuti A. Laurance Flatten, SPT   [place], PT 06/29/2022, 1:46 PM  1:46 PM, 06/29/22

## 2022-07-04 ENCOUNTER — Ambulatory Visit: Payer: BC Managed Care – PPO | Admitting: Physical Therapy

## 2022-07-04 ENCOUNTER — Encounter: Payer: Self-pay | Admitting: Physical Therapy

## 2022-07-04 DIAGNOSIS — M542 Cervicalgia: Secondary | ICD-10-CM

## 2022-07-04 NOTE — Therapy (Signed)
OUTPATIENT PHYSICAL THERAPY CERVICAL TREATMENT   Patient Name: Denise Arnold MRN: [id number] DOB:[birth year], 61 y.o., female Today's Date: 07/04/2022  END OF SESSION:  PT End of Session - 07/04/22 1306     Visit Number 9    Number of Visits 17    Date for PT Re-Evaluation 07/15/22    Authorization - Visit Number 9    Progress Note Due on Visit 10    PT Start Time 9935    PT Stop Time 1345    PT Time Calculation (min) 42 min    Activity Tolerance Patient tolerated treatment well;No increased pain    Behavior During Therapy WFL for tasks assessed/performed                    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HCC)    Scleroderma ([place])    positive FANA, positive SCL-70 abs, raynauds, mild pulmonary hypertension, normal DLCO   Tricuspid regurgitation    Vitamin D deficiency    Past Surgical History:  Procedure Laterality Date   CESAREAN SECTION  1994   COLONOSCOPY WITH PROPOFOL N/A 05/29/2015   Procedure: COLONOSCOPY WITH PROPOFOL;  Surgeon: Manya Silvas, MD;  Location: [place];  Service: Endoscopy;  Laterality: N/A;   DILATION AND CURETTAGE OF UTERUS  1992   DILATION AND CURETTAGE OF UTERUS  1993   DILATION AND CURETTAGE OF UTERUS  2011   VEIN SURGERY     Patient Active Problem List   Diagnosis Date Noted   Knee pain, left 06/17/2022   Soft tissue lesion of elbow region 05/30/2022   Sinusitis 03/27/2022   Finger ulcer ([place]) 01/16/2022   Breast pain, left 01/16/2022   Chest tightness 12/26/2021   Urinary urgency 10/11/2021   Headache 06/22/2021   Anxiety 06/06/2021   Palpitations 05/28/2021   Diarrhea 05/25/2021   Fatigue 05/25/2021   Vaginal pain 03/03/2019   Axillary fullness 01/13/2019   UTI (urinary tract infection) 10/27/2018   Cellulitis 01/16/2018   Localized swelling, mass and lump, upper limb  01/12/2018   Health care maintenance 01/25/2015   History of colonic polyps 04/22/2013   Vitamin D deficiency 01/15/2013   Degenerative cervical disc 01/15/2013   GERD (gastroesophageal reflux disease) 01/15/2013   Scleroderma ([place]) 07/17/2012   Raynauds phenomenon 07/17/2012    PCP: Einar Pheasant, MD  REFERRING PROVIDER: McLean-Scocuzza, Nino Glow, MD  REFERRING DIAG:  M54.2 (ICD-10-CM) - Cervicalgia  M25.511,M25.512 (ICD-10-CM) - Acute pain of both shoulders    THERAPY DIAG:  Cervicalgia  Rationale for Evaluation and Treatment: Rehabilitation  ONSET DATE: Insidious onset  SUBJECTIVE:  SUBJECTIVE STATEMENT: Pt reports she is not feeling any worse. Some compliance with HEP. Not having a lot of pain 3/10, mostly just stiffness.   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 Pt is also suffering from scleroderma, reports this could be a factor in current condition and lack of extension of fingers is demonstrated. Pt reports waking up most mornings feeling tightness in cervical spine and bilateral shoulder regions. Pt is receiving a mouth guard in the next couple of weeks to combat grinding teeth habit at night, pt hopes this will help with tension in neck and shoulders. 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 Pt also reports participating in a few other classes at the [facility] as well. Pt reports that this tightness of upper back does not get worse with these exercise classes but pushups are uncomfortable. PAIN:  Are you having pain? Yes: NPRS scale: TBA/10 Pain location: shoulders  up into low neck Pain description: tightness Aggravating factors: Stretching out at the end of a workout, downward dog Relieving factors: stretching, supine rolling exercises  PRECAUTIONS: None  WEIGHT BEARING RESTRICTIONS: No  FALLS:  Has patient fallen in last 6 months? 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   OBJECTIVE:   DIAGNOSTIC FINDINGS:  EXAM: CERVICAL SPINE - COMPLETE 4+ VIEW   COMPARISON:  01/02/2013   FINDINGS: Osseous demineralization.   Vertebral body heights maintained with scattered mild disc space narrowing.   Minor facet degenerative changes.   Bony foramina incompletely profiled.   No fracture, subluxation, or bone destruction.   Prevertebral soft tissues normal thickness.   Lung apices clear.   IMPRESSION: Mild degenerative disc and facet disease changes cervical spine with osseous demineralization.   No acute abnormalities.  EXAM: LEFT SHOULDER - 3 VIEW   COMPARISON:  None Available.   FINDINGS: There is no evidence of fracture or dislocation. There is no evidence of arthropathy or other focal bone abnormality. Visualized left lung is clear. Soft tissues are unremarkable.   IMPRESSION: Negative.  PATIENT SURVEYS:  FOTO 55/66  COGNITION: Overall cognitive status: Within functional limits for tasks assessed  SENSATION: Not tested  POSTURE: rounded shoulders and forward head  PALPATION: Reproduction of symptoms during CPA's of C6-T1. Moderate joint stiffness noted throughout these segments as well. UPA's of these segments also reproduced symptoms but not as much as CPA's. Limitations of bilateral glenohumeral joint noted  with limitations in ROM. Symmetrical musculature noted for parascapular musculature and upper traps, moderate tightness of upper trapezius and parascapular musculature.    CERVICAL ROM:   Active ROM A/PROM (deg) eval  Flexion [facility]  Extension WFL  Right lateral flexion 15  Left lateral flexion 16  Right rotation 40 (60 06/22/2022)  Left rotation 40 (56, 06/22/2022)   (Blank rows = not tested)  UPPER EXTREMITY ROM:  Active ROM Right eval Left eval  Shoulder flexion [facility]  Shoulder extension WFL, pain with OP WFL  Shoulder abduction [facility]  Shoulder adduction WFL, pain WFL, pain      Shoulder  internal rotation 60 pain w OP 79  Shoulder external rotation 76 88  Elbow flexion    Elbow extension    Wrist flexion    Wrist extension    Wrist ulnar deviation    Wrist radial deviation    Wrist pronation    Wrist supination     (Blank rows = not tested)  UPPER EXTREMITY MMT:  MMT Right eval Left eval  Shoulder flexion 4 4  Shoulder extension 4- 4-  Shoulder abduction 4 4  Shoulder adduction        Shoulder internal rotation    Shoulder external rotation    Middle trapezius 4 4  Lower trapezius 4 4  Elbow flexion    Elbow extension    Wrist flexion    Wrist extension    Wrist ulnar deviation    Wrist radial deviation    Wrist pronation    Wrist supination    Grip strength     (Blank rows = not tested)     TODAY'S TREATMENT:                                                                                                                                 UE bike standing 4 minutes level 5; 2 minutes forward and 2 minutes backwards; for mobilization of cervical and upper back articulations and musculature Standing rotations at wall with ball between hip and wall x12 each direction  Cat/Cow x12 with cuing for breath control with good carry over BirdDog 2x 12 with min cuing for set up with good carry over Plank on bosu ball scapular side to side x10; from knees  2x12 with good carry over of cuing  Upward wood chop 2x 6 bilat 7# DB with cuing to "follow DB with eyes" with good carry over Suboccipital stretch 30sec Upper Trap Stretch, 1 hold, 30 seconds bilaterally Levator Scapulae Stretch, 1 hold, 30 seconds bilaterally    PATIENT EDUCATION:  Education details: Patient was educated on diagnosis, anatomy and pathology involved, prognosis, role of PT, and was given an HEP, demonstrating exercise with proper form following verbal and tactile cues, and was given a paper hand out to continue exercise at home. Pt was educated on and agreed to plan of care.  Person educated: Patient Education method: Explanation, Demonstration, Tactile cues, Verbal cues, and Handouts Education comprehension: verbalized understanding, returned demonstration, verbal cues required, and tactile cues required  HOME EXERCISE PROGRAM: Cervical Rotation Snag 3 sets 12-20 reps Sleeper's internal rotation stretch for R shoulder 3 sets of 30 second holds Thoracic extensions with hands behind head, 3 sets of 12-20 reps   ASSESSMENT:  CLINICAL IMPRESSION:  PT continued therapeutic exercises to improve cervical, thoracic, and scapulohumeral mobility and strength. PT utilized therex with encouragement for natural, concordant cervical motions with success. PT increased power/plyometric demand with success.  Patient is able to comply with all cuing for proper technique of therex with good  effort and no increased pain throughout session. Pt with full cervical rotation AROM, but reporting that it feels "tight at the end of range:, encouraged in suboccipital and other HEP stretching for this. Patient will continue to benefit from skilled physical therapy to improve cervical mobility and strength for improved community ambulation and return to higher level of function.   OBJECTIVE IMPAIRMENTS: decreased mobility, decreased ROM, decreased strength, and hypomobility.   ACTIVITY LIMITATIONS:  lifting, bathing, and reach over head  PARTICIPATION LIMITATIONS: community activity and occupation  PERSONAL FACTORS: Age, Fitness, Profession, and 1 comorbidity: Scleroderma  are also affecting patient's functional outcome.   REHAB POTENTIAL: Good  CLINICAL DECISION MAKING: Evolving/moderate complexity  EVALUATION COMPLEXITY: Moderate   GOALS: Goals reviewed with patient? No  SHORT TERM GOALS: Target date: 07/01/2022  Pt will be independent with HEP in order to improve strength and balance in order to decrease fall risk and improve function at home and work.  Baseline: HEP given 06/03/2022 Goal status: INITIAL   LONG TERM GOALS: Target date: 07/29/2021  Patient will increase FOTO score to 66 to demonstrate predicted increase in functional mobility to complete ADLs.  Baseline: 55 Goal status: INITIAL  2.  Pt will demonstrate  and increase to at least 20 degrees of lateral flexion on both sides for improved community ambulation. Baseline: 06/03/2022: L:16 R:15 Goal status: INITIAL  3.  Pt will increase all UE and parascapular MMT's to a level of at least 4+ for increased independence with ADL and improved level of function during exercise classes. Baseline: See eval MMT chart Goal status: INITIAL  4. Pt will report waking up with out upper back tightness/pain 4 out of 7 days for increased QoL.  Baseline: 06/03/2022: almost every morning  Goal status: INITIAL   PLAN:  PT FREQUENCY: 1-2x/week  PT DURATION: 8 weeks  PLANNED INTERVENTIONS: Therapeutic exercises, Therapeutic activity, Neuromuscular re-education, Balance training, Gait training, Patient/Family education, Self Care, Joint mobilization, Dry Needling, and Manual therapy  PLAN FOR NEXT SESSION: Manual PPIVM of cervical spine    Durwin Reges DPT   Durwin Reges, PT 07/04/2022, 1:43 PM  1:43 PM, 07/04/22

## 2022-07-06 ENCOUNTER — Encounter: Payer: Self-pay | Admitting: Physical Therapy

## 2022-07-06 ENCOUNTER — Ambulatory Visit: Payer: BC Managed Care – PPO | Admitting: Physical Therapy

## 2022-07-06 DIAGNOSIS — M542 Cervicalgia: Secondary | ICD-10-CM | POA: Diagnosis not present

## 2022-07-06 NOTE — Therapy (Signed)
OUTPATIENT PHYSICAL THERAPY CERVICAL TREATMENT   Patient Name: Denise Arnold MRN: [id number] DOB:[birth year], 61 y.o., female Today's Date: 07/06/2022  END OF SESSION:  PT End of Session - 07/06/22 1308     Visit Number 10    Number of Visits 17    Date for PT Re-Evaluation 07/15/22    Authorization - Visit Number 10    Progress Note Due on Visit 10    PT Start Time 3299    PT Stop Time 1332    PT Time Calculation (min) 34 min    Activity Tolerance Patient tolerated treatment well;No increased pain    Behavior During Therapy WFL for tasks assessed/performed                     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HCC)    Scleroderma ([place])    positive FANA, positive SCL-70 abs, raynauds, mild pulmonary hypertension, normal DLCO   Tricuspid regurgitation    Vitamin D deficiency    Past Surgical History:  Procedure Laterality Date   CESAREAN SECTION  1994   COLONOSCOPY WITH PROPOFOL N/A 05/29/2015   Procedure: COLONOSCOPY WITH PROPOFOL;  Surgeon: Manya Silvas, MD;  Location: [place];  Service: Endoscopy;  Laterality: N/A;   DILATION AND CURETTAGE OF UTERUS  1992   DILATION AND CURETTAGE OF UTERUS  1993   DILATION AND CURETTAGE OF UTERUS  2011   VEIN SURGERY     Patient Active Problem List   Diagnosis Date Noted   Knee pain, left 06/17/2022   Soft tissue lesion of elbow region 05/30/2022   Sinusitis 03/27/2022   Finger ulcer ([place]) 01/16/2022   Breast pain, left 01/16/2022   Chest tightness 12/26/2021   Urinary urgency 10/11/2021   Headache 06/22/2021   Anxiety 06/06/2021   Palpitations 05/28/2021   Diarrhea 05/25/2021   Fatigue 05/25/2021   Vaginal pain 03/03/2019   Axillary fullness 01/13/2019   UTI (urinary tract infection) 10/27/2018   Cellulitis 01/16/2018   Localized swelling, mass and lump, upper  limb 01/12/2018   Health care maintenance 01/25/2015   History of colonic polyps 04/22/2013   Vitamin D deficiency 01/15/2013   Degenerative cervical disc 01/15/2013   GERD (gastroesophageal reflux disease) 01/15/2013   Scleroderma ([place]) 07/17/2012   Raynauds phenomenon 07/17/2012    PCP: Einar Pheasant, MD  REFERRING PROVIDER: McLean-Scocuzza, Nino Glow, MD  REFERRING DIAG:  M54.2 (ICD-10-CM) - Cervicalgia  M25.511,M25.512 (ICD-10-CM) - Acute pain of both shoulders    THERAPY DIAG:  Cervicalgia  Rationale for Evaluation and Treatment: Rehabilitation  ONSET DATE: Insidious onset  SUBJECTIVE:  SUBJECTIVE STATEMENT: Pt reports 3/10 pain today. Completed assessment for mouth guard yesterday, which she did okay with.   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 Pt is also suffering from scleroderma, reports this could be a factor in current condition and lack of extension of fingers is demonstrated. Pt reports waking up most mornings feeling tightness in cervical spine and bilateral shoulder regions. Pt is receiving a mouth guard in the next couple of weeks to combat grinding teeth habit at night, pt hopes this will help with tension in neck and shoulders. 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 Pt also reports participating in a few other classes at the [facility] as well. Pt reports that this tightness of upper back does not get worse with these exercise classes but pushups are uncomfortable. PAIN:  Are you having pain? Yes: NPRS scale: TBA/10 Pain location: shoulders up into low  neck Pain description: tightness Aggravating factors: Stretching out at the end of a workout, downward dog Relieving factors: stretching, supine rolling exercises  PRECAUTIONS: None  WEIGHT BEARING RESTRICTIONS: No  FALLS:  Has patient fallen in last 6 months? 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   OBJECTIVE:   DIAGNOSTIC FINDINGS:  EXAM: CERVICAL SPINE - COMPLETE 4+ VIEW   COMPARISON:  01/02/2013   FINDINGS: Osseous demineralization.   Vertebral body heights maintained with scattered mild disc space narrowing.   Minor facet degenerative changes.   Bony foramina incompletely profiled.   No fracture, subluxation, or bone destruction.   Prevertebral soft tissues normal thickness.   Lung apices clear.   IMPRESSION: Mild degenerative disc and facet disease changes cervical spine with osseous demineralization.   No acute abnormalities.  EXAM: LEFT SHOULDER - 3 VIEW   COMPARISON:  None Available.   FINDINGS: There is no evidence of fracture or dislocation. There is no evidence of arthropathy or other focal bone abnormality. Visualized left lung is clear. Soft tissues are unremarkable.   IMPRESSION: Negative.  PATIENT SURVEYS:  FOTO 55/66  COGNITION: Overall cognitive status: Within functional limits for tasks assessed  SENSATION: Not tested  POSTURE: rounded shoulders and forward head  PALPATION: Reproduction of symptoms during CPA's of C6-T1. Moderate joint stiffness noted throughout these segments as well. UPA's of these segments also reproduced symptoms but not as much as CPA's. Limitations of bilateral glenohumeral joint noted with  limitations in ROM. Symmetrical musculature noted for parascapular musculature and upper traps, moderate tightness of upper trapezius and parascapular musculature.    CERVICAL ROM:   Active ROM A/PROM (deg) eval  Flexion [facility]  Extension WFL  Right lateral flexion 15  Left lateral flexion 16  Right rotation 40 (60 06/22/2022)  Left rotation 40 (56, 06/22/2022)   (Blank rows = not tested)  UPPER EXTREMITY ROM:  Active ROM Right eval Left eval  Shoulder flexion [facility]  Shoulder extension WFL, pain with OP WFL  Shoulder abduction [facility]  Shoulder adduction WFL, pain WFL, pain      Shoulder internal rotation 60 pain w OP  79  Shoulder external rotation 76 88  Elbow flexion    Elbow extension    Wrist flexion    Wrist extension    Wrist ulnar deviation    Wrist radial deviation    Wrist pronation    Wrist supination     (Blank rows = not tested)  UPPER EXTREMITY MMT:  MMT Right eval Left eval  Shoulder flexion 4 4  Shoulder extension 4- 4-  Shoulder abduction 4 4  Shoulder adduction        Shoulder internal rotation    Shoulder external rotation    Middle trapezius 4 4  Lower trapezius 4 4  Elbow flexion    Elbow extension    Wrist flexion    Wrist extension    Wrist ulnar deviation    Wrist radial deviation    Wrist pronation    Wrist supination    Grip strength     (Blank rows = not tested)     TODAY'S TREATMENT:                                                                                                                                 UE bike standing 4 minutes level 5; 2 minutes forward and 2 minutes backwards; for mobilization of cervical and upper back articulations and musculature  PT reviewed the following HEP with patient with patient able to demonstrate a set of the following with min cuing for correction needed. PT educated patient on parameters of therex (how/when to inc/decrease intensity, frequency, rep/set range, stretch hold time, and  purpose of therex) with verbalized understanding.   Access Code: [email or web address] - Prone Scapular Retraction Y  - 1-2 x weekly - 3 sets - 8-12 reps - Standing Plank on Wall with Reaches and Resistance  - 1-2 x weekly - 3 sets - 8-12 reps - Plank with Shoulder Flexion on Counter  - 1-2 x weekly - 3 sets - 8-12 reps - Scaption with Dumbbells  - 1-2 x weekly - 3 sets - 8-12 reps   PATIENT EDUCATION:  Education details: Patient was educated on diagnosis, anatomy and pathology involved, prognosis, role of PT, and was given an HEP, demonstrating exercise with proper form following verbal and tactile cues, and was given a paper hand out to continue exercise at home. Pt was educated on and agreed to plan of care.  Person educated: Patient Education method: Explanation, Demonstration, Tactile cues, Verbal cues, and Handouts Education comprehension: verbalized understanding, returned demonstration, verbal cues required, and tactile cues required  HOME EXERCISE PROGRAM: Cervical Rotation Snag 3 sets 12-20 reps Sleeper's internal rotation stretch for R shoulder 3 sets of 30 second holds Thoracic extensions with hands behind head, 3 sets of 12-20 reps   ASSESSMENT:  CLINICAL IMPRESSION:  PT reassessed goals this session where patient has met most goals; with remaining deficits in pain. Although strength goals are met, lower trap strength is deficit of  shoulder complex with subjective patient complaint of overhead strength being remainder of issues.  Patient is able to demonstrate and verbalize understanding of all HEP recommendations with minimal corrections needed. Pt resistant to d/c PT to date, more comfortable with plan to continue PT 1x/week with supplement of robust HEP for strengthening and stretching 1-2x/week for the next 3 weeks with agreeance. PT will continue progression as able.     OBJECTIVE IMPAIRMENTS: decreased mobility, decreased ROM, decreased strength, and hypomobility.   ACTIVITY  LIMITATIONS: lifting, bathing, and reach over head  PARTICIPATION LIMITATIONS: community activity and occupation  PERSONAL FACTORS: Age, Fitness, Profession, and 1 comorbidity: Scleroderma  are also affecting patient's functional outcome.   REHAB POTENTIAL: Good  CLINICAL DECISION MAKING: Evolving/moderate complexity  EVALUATION COMPLEXITY: Moderate   GOALS: Goals reviewed with patient? No  SHORT TERM GOALS: Target date: 07/01/2022  Pt will be independent with HEP in order to improve strength and balance in order to decrease fall risk and improve function at home and work.  Baseline: HEP given 06/03/2022 Goal status: INITIAL   LONG TERM GOALS: Target date: 07/29/2021  Patient will increase FOTO score to 66 to demonstrate predicted increase in functional mobility to complete ADLs.  Baseline: 55; 07/06/22 66 Goal status: MET  2.  Pt will demonstrate  and increase to at least 20 degrees of lateral flexion on both sides for improved community ambulation. Baseline: 06/03/2022: L:16 R:15; 07/06/22 L  40 R 36 Goal status: MET  3.  Pt will increase all UE and parascapular MMT's to a level of at least 4+ for increased independence with ADL and improved level of function during exercise classes. Baseline: See eval MMT chart; 12/20 Y: 4+5; 5/5 for T and all other shoulder MMTs Goal status: MET  4. Pt will report waking up with out upper back tightness/pain 4 out of 7 days for increased QoL.  Baseline: 06/03/2022: almost every morning; 07/06/22 5x/week   Goal status: ONGOING   PLAN:  PT FREQUENCY: 1-2x/week  PT DURATION: 8 weeks  PLANNED INTERVENTIONS: Therapeutic exercises, Therapeutic activity, Neuromuscular re-education, Balance training, Gait training, Patient/Family education, Self Care, Joint mobilization, Dry Needling, and Manual therapy  PLAN FOR NEXT SESSION: Manual PPIVM of cervical spine    Durwin Reges DPT   Durwin Reges, PT 07/06/2022, 1:40  PM  1:40 PM, 07/06/22

## 2022-07-07 ENCOUNTER — Encounter: Payer: BC Managed Care – PPO | Admitting: Dermatology

## 2022-07-13 ENCOUNTER — Ambulatory Visit: Payer: BC Managed Care – PPO

## 2022-07-13 DIAGNOSIS — M542 Cervicalgia: Secondary | ICD-10-CM | POA: Diagnosis not present

## 2022-07-13 NOTE — Therapy (Signed)
OUTPATIENT PHYSICAL THERAPY CERVICAL TREATMENT   Patient Name: Denise Arnold MRN: [id number] DOB:[birth year], 61 y.o., female Today's Date: 07/13/2022  END OF SESSION:  PT End of Session - 07/13/22 1301     Visit Number 11    Number of Visits 17    Date for PT Re-Evaluation 07/15/22    Progress Note Due on Visit 10    PT Start Time 8413    PT Stop Time 1350    PT Time Calculation (min) 45 min    Activity Tolerance Patient tolerated treatment well;No increased pain    Behavior During Therapy WFL for tasks assessed/performed                     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HCC)    Scleroderma ([place])    positive FANA, positive SCL-70 abs, raynauds, mild pulmonary hypertension, normal DLCO   Tricuspid regurgitation    Vitamin D deficiency    Past Surgical History:  Procedure Laterality Date   CESAREAN SECTION  1994   COLONOSCOPY WITH PROPOFOL N/A 05/29/2015   Procedure: COLONOSCOPY WITH PROPOFOL;  Surgeon: Manya Silvas, MD;  Location: [facility];  Service: Endoscopy;  Laterality: N/A;   DILATION AND CURETTAGE OF UTERUS  1992   DILATION AND CURETTAGE OF UTERUS  1993   DILATION AND CURETTAGE OF UTERUS  2011   VEIN SURGERY     Patient Active Problem List   Diagnosis Date Noted   Knee pain, left 06/17/2022   Soft tissue lesion of elbow region 05/30/2022   Sinusitis 03/27/2022   Finger ulcer ([place]) 01/16/2022   Breast pain, left 01/16/2022   Chest tightness 12/26/2021   Urinary urgency 10/11/2021   Headache 06/22/2021   Anxiety 06/06/2021   Palpitations 05/28/2021   Diarrhea 05/25/2021   Fatigue 05/25/2021   Vaginal pain 03/03/2019   Axillary fullness 01/13/2019   UTI (urinary tract infection) 10/27/2018   Cellulitis 01/16/2018   Localized swelling, mass and lump, upper limb 01/12/2018   Health care  maintenance 01/25/2015   History of colonic polyps 04/22/2013   Vitamin D deficiency 01/15/2013   Degenerative cervical disc 01/15/2013   GERD (gastroesophageal reflux disease) 01/15/2013   Scleroderma ([place]) 07/17/2012   Raynauds phenomenon 07/17/2012    PCP: Einar Pheasant, MD  REFERRING PROVIDER: McLean-Scocuzza, Nino Glow, MD  REFERRING DIAG:  M54.2 (ICD-10-CM) - Cervicalgia  M25.511,M25.512 (ICD-10-CM) - Acute pain of both shoulders    THERAPY DIAG:  Cervicalgia  Rationale for Evaluation and Treatment: Rehabilitation  ONSET DATE: Insidious onset  SUBJECTIVE:  SUBJECTIVE STATEMENT: 07/13/22: Pt reports overall her neck is moving better and she feels like PT has been helpful.  She reports moderate adherence to HEP this week.  She notices that turning and reaching to grab her seat belt has gotten easier.  She is still having difficulty lifting, especially overhead.  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 Pt is also suffering from scleroderma, reports this could be a factor in current condition and lack of extension of fingers is demonstrated. Pt reports waking up most mornings feeling tightness in cervical spine and bilateral shoulder regions. Pt is receiving a mouth guard in the next couple of weeks to combat grinding teeth habit at night, pt hopes this will help with tension in neck and shoulders. 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 Pt also reports participating in a few other classes at the [facility] as well. Pt reports that this tightness of upper back does not get worse  with these exercise classes but pushups are uncomfortable. PAIN:  Are you having pain? Yes: NPRS scale: TBA/10 Pain location: shoulders up into low neck Pain description: tightness Aggravating factors: Stretching out at the end of a workout, downward dog Relieving factors: stretching, supine rolling exercises  PRECAUTIONS: None  WEIGHT BEARING RESTRICTIONS: No  FALLS:  Has patient fallen in last 6 months? 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   OBJECTIVE:   DIAGNOSTIC FINDINGS:  EXAM: CERVICAL SPINE - COMPLETE 4+ VIEW   COMPARISON:  01/02/2013   FINDINGS: Osseous demineralization.   Vertebral body heights maintained with scattered mild disc space narrowing.   Minor facet degenerative changes.   Bony foramina incompletely profiled.   No fracture, subluxation, or bone destruction.   Prevertebral soft tissues normal thickness.   Lung apices clear.   IMPRESSION: Mild degenerative disc and facet disease changes cervical spine with osseous demineralization.   No acute abnormalities.  EXAM: LEFT SHOULDER - 3 VIEW   COMPARISON:  None Available.   FINDINGS: There is no evidence of fracture or dislocation. There is no evidence of arthropathy or other focal bone abnormality. Visualized left lung is clear. Soft tissues are unremarkable.   IMPRESSION: Negative.  PATIENT SURVEYS:  FOTO 55/66  COGNITION: Overall cognitive status: Within functional limits for tasks assessed  SENSATION: Not tested  POSTURE: rounded shoulders and forward head  PALPATION: Reproduction of symptoms during CPA's of C6-T1. Moderate joint stiffness noted throughout these  segments as well. UPA's of these segments also reproduced symptoms but not as much as CPA's. Limitations of bilateral glenohumeral joint noted with limitations in Arnold. Symmetrical musculature noted for parascapular musculature and upper traps, moderate tightness of upper trapezius and parascapular musculature.    CERVICAL Arnold:   Active Arnold A/PROM (deg) eval  Flexion WFL  Extension WFL  Right lateral flexion 15  Left lateral flexion 16  Right rotation 40 (60 06/22/2022)  Left rotation 40 (56, 06/22/2022)   (Blank rows = not tested)  UPPER EXTREMITY Arnold:  Active Arnold Right eval Left eval  Shoulder flexion [facility]  WFL  Shoulder extension WFL, pain with OP WFL  Shoulder abduction [facility]  Shoulder adduction WFL, pain WFL, pain      Shoulder internal rotation 60 pain w OP 79  Shoulder external rotation 76 88  Elbow flexion    Elbow extension    Wrist flexion    Wrist extension    Wrist ulnar deviation    Wrist radial deviation    Wrist pronation    Wrist supination     (Blank rows = not tested)  UPPER EXTREMITY MMT:  MMT Right eval Left eval  Shoulder flexion 4 4  Shoulder extension 4- 4-  Shoulder abduction 4 4  Shoulder adduction        Shoulder internal rotation    Shoulder external rotation    Middle trapezius 4 4  Lower trapezius 4 4  Elbow flexion    Elbow extension    Wrist flexion    Wrist extension    Wrist ulnar deviation    Wrist radial deviation    Wrist pronation    Wrist supination    Grip strength     (Blank rows = not tested)     TODAY'S TREATMENT:                                                                                                                                07/13/22 UE bike standing 4 minutes level 5; 2 minutes forward and 2 minutes backwards; for mobilization of cervical and upper back articulations and musculature  Standing rotations at wall with ball between hip and wall x12 each direction  Plank on bosu ball scapular side  to side x10; from knees 2x12 with good carry over of cuing  Upward wood chop 2x 6 bilat 7# DB with cuing to "follow DB with eyes" with good carry over 15# med ball overhead lift 2x8 15# med ball slams 2x8 "Y" and "T" prone on white physioball: 2 sets to fatigue each TRX rows x15 TRX rows with ER x15 Thoracic spine rotation stretch in sidelying (open book) x10 R and L  Upper Trap Stretch, 1 hold, 30 seconds bilaterally Levator Scapulae Stretch, 1 hold, 30 seconds bilaterally  Access Code: [email or web address] - Prone Scapular Retraction Y  - 1-2 x weekly - 3 sets - 8-12 reps - Standing Plank on Wall with Reaches and Resistance  - 1-2 x weekly - 3 sets - 8-12 reps - Plank with Shoulder Flexion on Counter  - 1-2 x weekly - 3 sets - 8-12 reps - Scaption with Dumbbells  - 1-2 x weekly - 3 sets - 8-12 reps   PATIENT EDUCATION:  Education details: Patient was educated on diagnosis, anatomy and pathology involved, prognosis, role of PT, and was given an HEP, demonstrating exercise with proper form following verbal and tactile cues, and was given a paper hand out to continue exercise at home. Pt was educated on and agreed to plan of  care.  Person educated: Patient Education method: Explanation, Demonstration, Tactile cues, Verbal cues, and Handouts Education comprehension: verbalized understanding, returned demonstration, verbal cues required, and tactile cues required  HOME EXERCISE PROGRAM: Cervical Rotation Snag 3 sets 12-20 reps Sleeper's internal rotation stretch for R shoulder 3 sets of 30 second holds Thoracic extensions with hands behind head, 3 sets of 12-20 reps   ASSESSMENT:  CLINICAL IMPRESSION: 07/13/22 PT progressed scapular and postural mm retraining today with emphasis on neutral spine position during exercises.  Tactile cues and verbal cue of "chin tuck" were helpful to achieve optimal neck positioning.  She was challenged without c/o neck pain during session.  Pt would prefer to  still continue 1x/week with supplement of robust HEP for strengthening and stretching 1-2x/week for the next 3 weeks with agreeance. PT will continue progression as able.     OBJECTIVE IMPAIRMENTS: decreased mobility, decreased Arnold, decreased strength, and hypomobility.   ACTIVITY LIMITATIONS: lifting, bathing, and reach over head  PARTICIPATION LIMITATIONS: community activity and occupation  PERSONAL FACTORS: Age, Fitness, Profession, and 1 comorbidity: Scleroderma  are also affecting patient's functional outcome.   REHAB POTENTIAL: Good  CLINICAL DECISION MAKING: Evolving/moderate complexity  EVALUATION COMPLEXITY: Moderate   GOALS: Goals reviewed with patient? No  SHORT TERM GOALS: Target date: 07/01/2022  Pt will be independent with HEP in order to improve strength and balance in order to decrease fall risk and improve function at home and work.  Baseline: HEP given 06/03/2022 Goal status: INITIAL   LONG TERM GOALS: Target date: 07/29/2021  Patient will increase FOTO score to 66 to demonstrate predicted increase in functional mobility to complete ADLs.  Baseline: 55; 07/06/22 66 Goal status: MET  2.  Pt will demonstrate  and increase to at least 20 degrees of lateral flexion on both sides for improved community ambulation. Baseline: 06/03/2022: L:16 R:15; 07/06/22 L  40 R 36 Goal status: MET  3.  Pt will increase all UE and parascapular MMT's to a level of at least 4+ for increased independence with ADL and improved level of function during exercise classes. Baseline: See eval MMT chart; 12/20 Y: 4+5; 5/5 for T and all other shoulder MMTs Goal status: MET  4. Pt will report waking up with out upper back tightness/pain 4 out of 7 days for increased QoL.  Baseline: 06/03/2022: almost every morning; 07/06/22 5x/week   Goal status: ONGOING   PLAN:  PT FREQUENCY: 1-2x/week  PT DURATION: 8 weeks  PLANNED INTERVENTIONS: Therapeutic exercises, Therapeutic activity,  Neuromuscular re-education, Balance training, Gait training, Patient/Family education, Self Care, Joint mobilization, Dry Needling, and Manual therapy  PLAN FOR NEXT SESSION: Manual PPIVM of cervical spine    Merdis Delay, PT, DPT, OCS  #[id number]   Pincus Badder, PT 07/13/2022, 2:16 PM  2:16 PM, 07/13/22

## 2022-07-14 ENCOUNTER — Ambulatory Visit (INDEPENDENT_AMBULATORY_CARE_PROVIDER_SITE_OTHER): Payer: BC Managed Care – PPO | Admitting: Dermatology

## 2022-07-14 DIAGNOSIS — D225 Melanocytic nevi of trunk: Secondary | ICD-10-CM

## 2022-07-14 DIAGNOSIS — D229 Melanocytic nevi, unspecified: Secondary | ICD-10-CM

## 2022-07-14 DIAGNOSIS — M349 Systemic sclerosis, unspecified: Secondary | ICD-10-CM

## 2022-07-14 DIAGNOSIS — B351 Tinea unguium: Secondary | ICD-10-CM

## 2022-07-14 DIAGNOSIS — L821 Other seborrheic keratosis: Secondary | ICD-10-CM

## 2022-07-14 DIAGNOSIS — I781 Nevus, non-neoplastic: Secondary | ICD-10-CM

## 2022-07-14 MED ORDER — TAVABOROLE 5 % EX SOLN: CUTANEOUS | 2 refills | Status: DC

## 2022-07-14 MED ORDER — CICLOPIROX OLAMINE 0.77 % EX CREA: TOPICAL_CREAM | Freq: Every day | CUTANEOUS | 0 refills | Status: AC

## 2022-07-14 NOTE — Progress Notes (Signed)
Follow-Up Visit   Subjective  Denise Arnold is a 61 y.o. female who presents for the following: Annual Exam. Patient with history of Scleroderma with raynaud's would like to discuss Botox for Raynaud's. Needs rf Kerydin solution for toenail fungus. The patient presents for Total-Body Skin Exam (TBSE) for skin cancer screening and mole check.  The patient has spots, moles and lesions to be evaluated, some may be new or changing and the patient has concerns that these could be cancer.    The following portions of the chart were reviewed this encounter and updated as appropriate:       Review of Systems:  No other skin or systemic complaints except as noted in HPI or Assessment and Plan.  Objective  Well appearing patient in no apparent distress; mood and affect are within normal limits.  A full examination was performed including scalp, head, eyes, ears, nose, lips, neck, chest, axillae, abdomen, back, buttocks, bilateral upper extremities, bilateral lower extremities, hands, feet, fingers, toes, fingernails, and toenails. All findings within normal limits unless otherwise noted below.  face.hands,feet Erythema consistent with a healed ulcer of the right elbow; mild erythema and tightening of the skin of the elbows, bil foot dorsum; tightening of the skin and erythema of the PIPs and MCPs, healing ulcerations fingertips, tightening of skin perioral with rhytides. Telangiectasias on face.  right great toenail Nail dystrophy with thickening, yellow white discoloration.       right abdomen 6.0 mm light tan macule stable compared to prior photo    Assessment & Plan  Scleroderma ([place]) face.hands,feet  With digital ulcerations. Pt with Raynaud's, Chronic and persistent condition with duration or expected duration over one year. Condition is symptomatic/ bothersome to patient. Not currently at goal.    Continue Sildenafil Citrate as prescribed by Dr Posey Pronto. Continue Clobetasol Cream  qd/bid prn for increasing symptoms for up to 2 weeks. Continue tacrolimus 0.1% ointment Apply to AA qd/bid dsp 60g 1Rf. Continue Mupirocin ointment to open areas qd-bid prn May try Voltaren Gel to dorsum feet if painful   Recommend Botox injections for medical treatment of digital ulcerations.  Pt's rheumatologist will refer to Dr. in [facility] who does this procedure.   Related Medications tacrolimus (PROTOPIC) 0.1 % ointment Apply to affected areas once to twice a day.  Tinea unguium right great toenail  Chronic and persistent condition with duration or expected duration over one year. Condition is symptomatic / bothersome to patient. Not to goal.   Continue Tavaboraole solution to affected toenail at bedtime.  Will take at least a year to grow out the toenail. Start Ciclopirox cream apply to affected toe nail once a day    Discussed weekly Fluconazole, patient decline today she will call here if she would like to add Fluconazole in future.  Side effects of fluconazole (diflucan) include nausea, diarrhea, headache, dizziness, taste changes, rare risk of irritation of the liver, allergy, or decreased blood counts (which could show up as infection or tiredness).   Tavaborole (KERYDIN) 5 % SOLN - right great toenail Apply to affected toenail every night until improved.  Nevus right abdomen  Benign-appearing.  Stable. Observation.  Call clinic for new or changing moles.  Recommend daily use of broad spectrum spf 30+ sunscreen to sun-exposed areas.    Seborrheic Keratoses - Stuck-on, waxy, tan-brown papules and/or plaques  - Benign-appearing - Discussed benign etiology and prognosis. - Observe - Call for any changes  Melanocytic Nevi - Tan-brown and/or pink-flesh-colored symmetric macules and  papules - Benign appearing on exam today - Observation - Call clinic for new or changing moles - Recommend daily use of broad spectrum spf 30+ sunscreen to sun-exposed areas.     Telangiectasia Face (due to scleroderma) - Dilated blood vessel - Benign appearing on exam - Call for changes    Return in about 1 year (around 07/15/2023) for TBSE .  I, Marye Round, CMA, am acting as scribe for Brendolyn Patty, MD .   Documentation: I have reviewed the above documentation for accuracy and completeness, and I agree with the above.  Brendolyn Patty MD

## 2022-07-14 NOTE — Patient Instructions (Signed)
Due to recent changes in healthcare laws, you may see results of your pathology and/or laboratory studies on MyChart before the doctors have had a chance to review them. We understand that in some cases there may be results that are confusing or concerning to you. Please understand that not all results are received at the same time and often the doctors may need to interpret multiple results in order to provide you with the best plan of care or course of treatment. Therefore, we ask that you please give us 2 business days to thoroughly review all your results before contacting the office for clarification. Should we see a critical lab result, you will be contacted sooner.   If You Need Anything After Your Visit  If you have any questions or concerns for your doctor, please call our main line at [phone] and press option 4 to reach your doctor's medical assistant. If no one answers, please leave a voicemail as directed and we will return your call as soon as possible. Messages left after 4 pm will be answered the following business day.   You may also send us a message via MyChart. We typically respond to MyChart messages within 1-2 business days.  For prescription refills, please ask your pharmacy to contact our office. Our fax number is [phone].  If you have an urgent issue when the clinic is closed that cannot wait until the next business day, you can Batz your doctor at the number below.    Please note that while we do our best to be available for urgent issues outside of office hours, we are not available 24/7.   If you have an urgent issue and are unable to reach us, you may choose to seek medical care at your doctor's office, retail clinic, urgent care center, or emergency room.  If you have a medical emergency, please immediately call 911 or go to the emergency department.  Pager Numbers  - Dr. Kowalski: [phone]  - Dr. Moye: [phone]  - Dr. Stewart:  [phone]  In the event of inclement weather, please call our main line at [phone] for an update on the status of any delays or closures.  Dermatology Medication Tips: Please keep the boxes that topical medications come in in order to help keep track of the instructions about where and how to use these. Pharmacies typically print the medication instructions only on the boxes and not directly on the medication tubes.   If your medication is too expensive, please contact our office at [phone] option 4 or send us a message through MyChart.   We are unable to tell what your co-pay for medications will be in advance as this is different depending on your insurance coverage. However, we may be able to find a substitute medication at lower cost or fill out paperwork to get insurance to cover a needed medication.   If a prior authorization is required to get your medication covered by your insurance company, please allow us 1-2 business days to complete this process.  Drug prices often vary depending on where the prescription is filled and some pharmacies may offer cheaper prices.  The website www.goodrx.com contains coupons for medications through different pharmacies. The prices here do not account for what the cost may be with help from insurance (it may be cheaper with your insurance), but the website can give you the price if you did not use any insurance.  - You can print the associated coupon and take it with   your prescription to the pharmacy.  - You may also stop by our office during regular business hours and pick up a GoodRx coupon card.  - If you need your prescription sent electronically to a different pharmacy, notify our office through [facility] MyChart or by phone at [phone] option 4.     Si Usted Necesita Algo Despus de Su Visita  Tambin puede enviarnos un mensaje a travs de MyChart. Por lo general respondemos a los mensajes de MyChart en el transcurso de 1 a 2  das hbiles.  Para renovar recetas, por favor pida a su farmacia que se ponga en contacto con nuestra oficina. Nuestro nmero de fax es el [phone].  Si tiene un asunto urgente cuando la clnica est cerrada y que no puede esperar hasta el siguiente da hbil, puede llamar/localizar a su doctor(a) al nmero que aparece a continuacin.   Por favor, tenga en cuenta que aunque hacemos todo lo posible para estar disponibles para asuntos urgentes fuera del horario de oficina, no estamos disponibles las 24 horas del da, los 7 das de la semana.   Si tiene un problema urgente y no puede comunicarse con nosotros, puede optar por buscar atencin mdica  en el consultorio de su doctor(a), en una clnica privada, en un centro de atencin urgente o en una sala de emergencias.  Si tiene una emergencia mdica, por favor llame inmediatamente al 911 o vaya a la sala de emergencias.  Nmeros de bper  - Dr. Kowalski: [phone]  - Dra. Moye: [phone]  - Dra. Stewart: [phone]  En caso de inclemencias del tiempo, por favor llame a nuestra lnea principal al [phone] para una actualizacin sobre el estado de cualquier retraso o cierre.  Consejos para la medicacin en dermatologa: Por favor, guarde las cajas en las que vienen los medicamentos de uso tpico para ayudarle a seguir las instrucciones sobre dnde y cmo usarlos. Las farmacias generalmente imprimen las instrucciones del medicamento slo en las cajas y no directamente en los tubos del medicamento.   Si su medicamento es muy caro, por favor, pngase en contacto con nuestra oficina llamando al [phone] y presione la opcin 4 o envenos un mensaje a travs de MyChart.   No podemos decirle cul ser su copago por los medicamentos por adelantado ya que esto es diferente dependiendo de la cobertura de su seguro. Sin embargo, es posible que podamos encontrar un medicamento sustituto a menor costo o llenar un formulario para que el  seguro cubra el medicamento que se considera necesario.   Si se requiere una autorizacin previa para que su compaa de seguros cubra su medicamento, por favor permtanos de 1 a 2 das hbiles para completar este proceso.  Los precios de los medicamentos varan con frecuencia dependiendo del lugar de dnde se surte la receta y alguna farmacias pueden ofrecer precios ms baratos.  El sitio web www.goodrx.com tiene cupones para medicamentos de diferentes farmacias. Los precios aqu no tienen en cuenta lo que podra costar con la ayuda del seguro (puede ser ms barato con su seguro), pero el sitio web puede darle el precio si no utiliz ningn seguro.  - Puede imprimir el cupn correspondiente y llevarlo con su receta a la farmacia.  - Tambin puede pasar por nuestra oficina durante el horario de atencin regular y recoger una tarjeta de cupones de GoodRx.  - Si necesita que su receta se enve electrnicamente a una farmacia diferente, informe a nuestra oficina a travs de MyChart de [facility]   o por telfono llamando al [phone] y presione la opcin 4.  

## 2022-07-15 ENCOUNTER — Ambulatory Visit: Payer: BC Managed Care – PPO | Admitting: Physical Therapy

## 2022-07-19 ENCOUNTER — Ambulatory Visit: Payer: BC Managed Care – PPO | Attending: Internal Medicine | Admitting: Physical Therapy

## 2022-07-19 ENCOUNTER — Encounter: Payer: Self-pay | Admitting: Physical Therapy

## 2022-07-19 DIAGNOSIS — M542 Cervicalgia: Secondary | ICD-10-CM | POA: Diagnosis not present

## 2022-07-19 NOTE — Therapy (Signed)
OUTPATIENT PHYSICAL THERAPY CERVICAL TREATMENT   Patient Name: Denise Arnold MRN: [id number] DOB:02-Aug-1960, 62 y.o., female Today's Date: 07/19/2022  END OF SESSION:  PT End of Session - 07/19/22 1137     Visit Number 12    Number of Visits 17    Date for PT Re-Evaluation 08/26/22    Authorization - Visit Number 12    Progress Note Due on Visit 10    PT Start Time 5993    PT Stop Time 1211    PT Time Calculation (min) 38 min    Activity Tolerance Patient tolerated treatment well;No increased pain    Behavior During Therapy WFL for tasks assessed/performed                      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HCC)    Scleroderma ([place])    positive FANA, positive SCL-70 abs, raynauds, mild pulmonary hypertension, normal DLCO   Tricuspid regurgitation    Vitamin D deficiency    Past Surgical History:  Procedure Laterality Date   CESAREAN SECTION  1994   COLONOSCOPY WITH PROPOFOL N/A 05/29/2015   Procedure: COLONOSCOPY WITH PROPOFOL;  Surgeon: Manya Silvas, MD;  Location: [facility];  Service: Endoscopy;  Laterality: N/A;   DILATION AND CURETTAGE OF UTERUS  1992   DILATION AND CURETTAGE OF UTERUS  1993   DILATION AND CURETTAGE OF UTERUS  2011   VEIN SURGERY     Patient Active Problem List   Diagnosis Date Noted   Knee pain, left 06/17/2022   Soft tissue lesion of elbow region 05/30/2022   Sinusitis 03/27/2022   Finger ulcer ([place]) 01/16/2022   Breast pain, left 01/16/2022   Chest tightness 12/26/2021   Urinary urgency 10/11/2021   Headache 06/22/2021   Anxiety 06/06/2021   Palpitations 05/28/2021   Diarrhea 05/25/2021   Fatigue 05/25/2021   Vaginal pain 03/03/2019   Axillary fullness 01/13/2019   UTI (urinary tract infection) 10/27/2018   Cellulitis 01/16/2018   Localized swelling, mass and lump, upper  limb 01/12/2018   Health care maintenance 01/25/2015   History of colonic polyps 04/22/2013   Vitamin D deficiency 01/15/2013   Degenerative cervical disc 01/15/2013   GERD (gastroesophageal reflux disease) 01/15/2013   Scleroderma ([place]) 07/17/2012   Raynauds phenomenon 07/17/2012    PCP: Einar Pheasant, MD  REFERRING PROVIDER: McLean-Scocuzza, Nino Glow, MD  REFERRING DIAG:  M54.2 (ICD-10-CM) - Cervicalgia  M25.511,M25.512 (ICD-10-CM) - Acute pain of both shoulders    THERAPY DIAG:  Cervicalgia  Rationale for Evaluation and Treatment: Rehabilitation  ONSET DATE: Insidious onset  SUBJECTIVE:  SUBJECTIVE STATEMENT: Pt reports she enjoyed theraball exercises last session and bought a theraball to be able to use at home. Pt reports minimal pain this am, only 1/10.   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 Pt is also suffering from scleroderma, reports this could be a factor in current condition and lack of extension of fingers is demonstrated. Pt reports waking up most mornings feeling tightness in cervical spine and bilateral shoulder regions. Pt is receiving a mouth guard in the next couple of weeks to combat grinding teeth habit at night, pt hopes this will help with tension in neck and shoulders. 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 Pt also reports participating in a few other classes at the [facility] as well. Pt reports that this tightness of upper back does not get worse with these exercise classes but pushups are uncomfortable. PAIN:  Are you having pain? Yes: NPRS scale:  TBA/10 Pain location: shoulders up into low neck Pain description: tightness Aggravating factors: Stretching out at the end of a workout, downward dog Relieving factors: stretching, supine rolling exercises  PRECAUTIONS: None  WEIGHT BEARING RESTRICTIONS: No  FALLS:  Has patient fallen in last 6 months? 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   OBJECTIVE:   DIAGNOSTIC FINDINGS:  EXAM: CERVICAL SPINE - COMPLETE 4+ VIEW   COMPARISON:  01/02/2013   FINDINGS: Osseous demineralization.   Vertebral body heights maintained with scattered mild disc space narrowing.   Minor facet degenerative changes.   Bony foramina incompletely profiled.   No fracture, subluxation, or bone destruction.   Prevertebral soft tissues normal thickness.   Lung apices clear.   IMPRESSION: Mild degenerative disc and facet disease changes cervical spine with osseous demineralization.   No acute abnormalities.  EXAM: LEFT SHOULDER - 3 VIEW   COMPARISON:  None Available.   FINDINGS: There is no evidence of fracture or dislocation. There is no evidence of arthropathy or other focal bone abnormality. Visualized left lung is clear. Soft tissues are unremarkable.   IMPRESSION: Negative.  PATIENT SURVEYS:  FOTO 55/66  COGNITION: Overall cognitive status: Within functional limits for tasks assessed  SENSATION: Not tested  POSTURE: rounded shoulders and forward head  PALPATION: Reproduction of symptoms during CPA's of C6-T1. Moderate joint stiffness noted throughout these segments as well. UPA's of these segments also reproduced symptoms but not as much as CPA's. Limitations of  bilateral glenohumeral joint noted with limitations in ROM. Symmetrical musculature noted for parascapular musculature and upper traps, moderate tightness of upper trapezius and parascapular musculature.    CERVICAL ROM:   Active ROM A/PROM (deg) eval  Flexion [facility]  Extension WFL  Right lateral flexion 15  Left lateral flexion 16  Right rotation 40 (60 06/22/2022)  Left rotation 40 (56, 06/22/2022)   (Blank rows = not tested)  UPPER EXTREMITY ROM:  Active ROM Right eval Left eval  Shoulder flexion [facility]  Shoulder extension WFL, pain with OP WFL  Shoulder abduction [facility]  Shoulder adduction WFL, pain WFL, pain  Shoulder internal rotation 60 pain w OP 79  Shoulder external rotation 76 88  Elbow flexion    Elbow extension    Wrist flexion    Wrist extension    Wrist ulnar deviation    Wrist radial deviation    Wrist pronation    Wrist supination     (Blank rows = not tested)  UPPER EXTREMITY MMT:  MMT Right eval Left eval  Shoulder flexion 4 4  Shoulder extension 4- 4-  Shoulder abduction 4 4  Shoulder adduction        Shoulder internal rotation    Shoulder external rotation    Middle trapezius 4 4  Lower trapezius 4 4  Elbow flexion    Elbow extension    Wrist flexion    Wrist extension    Wrist ulnar deviation    Wrist radial deviation    Wrist pronation    Wrist supination    Grip strength     (Blank rows = not tested)     TODAY'S TREATMENT:                                                                                                                                UE bike standing 4 minutes level 5; 2 minutes forward and 2 minutes backwards; for mobilization of cervical and upper back articulations and musculature  Theraball stir the pot x12 each direction [employer] with good carry over following initial cuing for set up Theraball T x12 with good carry over of cuing for scapular retraction Theraball rows 4# DB x12 with good carry  over of TC for technique  Theraball stir the pot x12 each direction Theraball Y x12  Theraball T x12  Theraball rows 4# DB x12   Deadbug position with theraball core squeeze 2x 12   Standing rotations at wall with ball between hip and wall x12 each direction   Upper Trap Stretch, 1 hold, 30 seconds bilaterally Levator Scapulae Stretch, 1 hold, 30 seconds bilaterally Doorway overhead stretch 30sec    PATIENT EDUCATION:  Education details: Patient was educated on diagnosis, anatomy and pathology involved, prognosis, role of PT, and was given an HEP, demonstrating exercise with proper form following verbal and tactile cues, and was given a paper hand out to continue exercise at home. Pt was educated on and agreed to plan of care.  Person educated: Patient Education method: Explanation, Demonstration, Tactile cues, Verbal cues, and Handouts Education comprehension: verbalized understanding, returned demonstration, verbal cues required, and tactile cues required  HOME EXERCISE PROGRAM: Cervical Rotation Snag 3 sets 12-20 reps Sleeper's internal rotation stretch for R shoulder 3 sets of 30 second holds Thoracic extensions with hands behind head, 3 sets of 12-20 reps   ASSESSMENT:  CLINICAL IMPRESSION: PT progressed scapular and postural mm retraining today with emphasis on neutral spine position during exercises.  PT  PT will continue progression as able.  OBJECTIVE IMPAIRMENTS: decreased mobility, decreased ROM, decreased strength, and hypomobility.   ACTIVITY LIMITATIONS: lifting, bathing, and reach over head  PARTICIPATION LIMITATIONS: community activity and occupation  PERSONAL FACTORS: Age, Fitness, Profession, and 1 comorbidity: Scleroderma  are also affecting patient's functional outcome.   REHAB POTENTIAL: Good  CLINICAL DECISION MAKING: Evolving/moderate complexity  EVALUATION COMPLEXITY: Moderate   GOALS: Goals reviewed with patient? No  SHORT TERM GOALS:  Target date: 07/01/2022  Pt will be independent with HEP in order to improve strength and balance in order to decrease fall risk and improve function at home and work.  Baseline: HEP given 06/03/2022 Goal status: INITIAL   LONG TERM GOALS: Target date: 07/29/2021  Patient will increase FOTO score to 66 to demonstrate predicted increase in functional mobility to complete ADLs.  Baseline: 55; 07/06/22 66 Goal status: MET  2.  Pt will demonstrate  and increase to at least 20 degrees of lateral flexion on both sides for improved community ambulation. Baseline: 06/03/2022: L:16 R:15; 07/06/22 L  40 R 36 Goal status: MET  3.  Pt will increase all UE and parascapular MMT's to a level of at least 4+ for increased independence with ADL and improved level of function during exercise classes. Baseline: See eval MMT chart; 12/20 Y: 4+5; 5/5 for T and all other shoulder MMTs Goal status: MET  4. Pt will report waking up with out upper back tightness/pain 4 out of 7 days for increased QoL.  Baseline: 06/03/2022: almost every morning; 07/06/22 5x/week   Goal status: ONGOING   PLAN:  PT FREQUENCY: 1-2x/week  PT DURATION: 8 weeks  PLANNED INTERVENTIONS: Therapeutic exercises, Therapeutic activity, Neuromuscular re-education, Balance training, Gait training, Patient/Family education, Self Care, Joint mobilization, Dry Needling, and Manual therapy  PLAN FOR NEXT SESSION: Manual PPIVM of cervical spine    Durwin Reges DPT  Durwin Reges, PT 07/19/2022, 12:12 PM  12:12 PM, 07/19/22

## 2022-07-25 ENCOUNTER — Encounter: Payer: Self-pay | Admitting: Physical Therapy

## 2022-07-25 ENCOUNTER — Encounter: Payer: Self-pay | Admitting: Internal Medicine

## 2022-07-25 ENCOUNTER — Ambulatory Visit: Payer: BC Managed Care – PPO | Admitting: Physical Therapy

## 2022-07-25 DIAGNOSIS — M542 Cervicalgia: Secondary | ICD-10-CM

## 2022-07-25 NOTE — Therapy (Signed)
OUTPATIENT PHYSICAL THERAPY CERVICAL TREATMENT/DC Summary   Patient Name: Denise Arnold MRN: [id number] DOB:23-Mar-1961, 62 y.o., female Today's Date: 07/25/2022  END OF SESSION:  PT End of Session - 07/25/22 1130     Visit Number 13    Number of Visits 17    Date for PT Re-Evaluation 08/26/22    Authorization - Visit Number 13    Progress Note Due on Visit 20    PT Start Time 6195    PT Stop Time 1200    PT Time Calculation (min) 32 min    Activity Tolerance Patient tolerated treatment well;No increased pain    Behavior During Therapy WFL for tasks assessed/performed                       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HCC)    Scleroderma ([place])    positive FANA, positive SCL-70 abs, raynauds, mild pulmonary hypertension, normal DLCO   Tricuspid regurgitation    Vitamin D deficiency    Past Surgical History:  Procedure Laterality Date   CESAREAN SECTION  1994   COLONOSCOPY WITH PROPOFOL N/A 05/29/2015   Procedure: COLONOSCOPY WITH PROPOFOL;  Surgeon: Manya Silvas, MD;  Location: [facility];  Service: Endoscopy;  Laterality: N/A;   DILATION AND CURETTAGE OF UTERUS  1992   DILATION AND CURETTAGE OF UTERUS  1993   DILATION AND CURETTAGE OF UTERUS  2011   VEIN SURGERY     Patient Active Problem List   Diagnosis Date Noted   Knee pain, left 06/17/2022   Soft tissue lesion of elbow region 05/30/2022   Sinusitis 03/27/2022   Finger ulcer ([place]) 01/16/2022   Breast pain, left 01/16/2022   Chest tightness 12/26/2021   Urinary urgency 10/11/2021   Headache 06/22/2021   Anxiety 06/06/2021   Palpitations 05/28/2021   Diarrhea 05/25/2021   Fatigue 05/25/2021   Vaginal pain 03/03/2019   Axillary fullness 01/13/2019   UTI (urinary tract infection) 10/27/2018   Cellulitis 01/16/2018   Localized swelling, mass and  lump, upper limb 01/12/2018   Health care maintenance 01/25/2015   History of colonic polyps 04/22/2013   Vitamin D deficiency 01/15/2013   Degenerative cervical disc 01/15/2013   GERD (gastroesophageal reflux disease) 01/15/2013   Scleroderma ([place]) 07/17/2012   Raynauds phenomenon 07/17/2012    PCP: Einar Pheasant, MD  REFERRING PROVIDER: McLean-Scocuzza, Nino Glow, MD  REFERRING DIAG:  M54.2 (ICD-10-CM) - Cervicalgia  M25.511,M25.512 (ICD-10-CM) - Acute pain of both shoulders    THERAPY DIAG:  Cervicalgia  Rationale for Evaluation and Treatment: Rehabilitation  ONSET DATE: Insidious onset  SUBJECTIVE:  SUBJECTIVE STATEMENT: Feeling good overall. 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 Pt is also suffering from scleroderma, reports this could be a factor in current condition and lack of extension of fingers is demonstrated. Pt reports waking up most mornings feeling tightness in cervical spine and bilateral shoulder regions. Pt is receiving a mouth guard in the next couple of weeks to combat grinding teeth habit at night, pt hopes this will help with tension in neck and shoulders. 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 Pt also reports participating in a few other classes at the [facility] as well. Pt reports that this tightness of upper back does not get worse with these exercise classes but pushups are uncomfortable. PAIN:  Are you having pain? Yes: NPRS scale: TBA/10 Pain location: shoulders up into low neck Pain description:  tightness Aggravating factors: Stretching out at the end of a workout, downward dog Relieving factors: stretching, supine rolling exercises  PRECAUTIONS: None  WEIGHT BEARING RESTRICTIONS: No  FALLS:  Has patient fallen in last 6 months? 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   OBJECTIVE:   DIAGNOSTIC FINDINGS:  EXAM: CERVICAL SPINE - COMPLETE 4+ VIEW   COMPARISON:  01/02/2013   FINDINGS: Osseous demineralization.   Vertebral body heights maintained with scattered mild disc space narrowing.   Minor facet degenerative changes.   Bony foramina incompletely profiled.   No fracture, subluxation, or bone destruction.   Prevertebral soft tissues normal thickness.   Lung apices clear.   IMPRESSION: Mild degenerative disc and facet disease changes cervical spine with osseous demineralization.   No acute abnormalities.  EXAM: LEFT SHOULDER - 3 VIEW   COMPARISON:  None Available.   FINDINGS: There is no evidence of fracture or dislocation. There is no evidence of arthropathy or other focal bone abnormality. Visualized left lung is clear. Soft tissues are unremarkable.   IMPRESSION: Negative.  PATIENT SURVEYS:  FOTO 55/66  COGNITION: Overall cognitive status: Within functional limits for tasks assessed  SENSATION: Not tested  POSTURE: rounded shoulders and forward head  PALPATION: Reproduction of symptoms during CPA's of C6-T1. Moderate joint stiffness noted throughout these segments as well. UPA's of these segments also reproduced symptoms but not as much as CPA's. Limitations of bilateral glenohumeral joint noted with limitations in ROM. Symmetrical  musculature noted for parascapular musculature and upper traps, moderate tightness of upper trapezius and parascapular musculature.    CERVICAL ROM:   Active ROM A/PROM (deg) eval  Flexion WFL  Extension WFL  Right lateral flexion 15  Left lateral flexion 16  Right rotation 40 (60 06/22/2022)  Left rotation 40 (56, 06/22/2022)   (Blank rows = not tested)  UPPER EXTREMITY ROM:  Active ROM Right eval Left eval  Shoulder flexion [facility]  Shoulder extension WFL, pain with OP WFL  Shoulder abduction [facility]  Shoulder adduction WFL, pain WFL, pain      Shoulder internal rotation 60 pain w OP 79  Shoulder external rotation 76 88  Elbow flexion    Elbow extension    Wrist flexion    Wrist extension    Wrist ulnar deviation    Wrist radial deviation    Wrist pronation    Wrist supination     (Blank rows = not tested)  UPPER EXTREMITY MMT:  MMT Right eval Left eval  Shoulder flexion 4 4  Shoulder extension 4- 4-  Shoulder abduction 4 4  Shoulder adduction        Shoulder internal rotation    Shoulder external rotation    Middle trapezius 4 4  Lower trapezius 4 4  Elbow flexion    Elbow extension    Wrist flexion    Wrist extension    Wrist ulnar deviation    Wrist radial deviation    Wrist pronation    Wrist supination    Grip strength     (Blank rows = not tested)     TODAY'S TREATMENT:                                                                                                                                Nustep seat 7 UE 13 L5 for gentle periscapular strengthening    PT reviewed the following HEP with patient with patient able to demonstrate a set of the following with min cuing for correction needed. PT educated patient on parameters of therex (how/when to inc/decrease intensity, frequency, rep/set range, stretch hold time, and purpose of therex) with verbalized understanding.   - Prone Scapular Retraction Y/ completed on theraball as well with  patietn comfort with theraball - 1-2 x weekly - 3 sets - 8-12 reps - Standing Plank on Wall with Reaches and Red tband resistance  - 1-2 x weekly - 3 sets - 8-12 reps - Plank with alt Shoulder Flexion  - 1-2 x weekly - 3 sets - 8-12 reps - Scaption with Dumbbells  - 1-2 x weekly - 3 sets - 8-12 reps - Theraball T #2 - 1-2 x weekly - 3 sets - 8-12 reps - Theraball rows 4# DB 1-2 x weekly - 3 sets - 8-12 reps   Standing rotations at wall with ball between hip and wall x12 each direction   Upper Trap Stretch, 1 hold, 30 seconds bilaterally Levator Scapulae Stretch, 1 hold, 30 seconds bilaterally Doorway overhead stretch 30sec    PATIENT EDUCATION:  Education details: Patient was educated on diagnosis, anatomy and pathology involved, prognosis, role of PT, and was given an HEP, demonstrating exercise with proper form following verbal and tactile cues, and was given a paper hand out to continue exercise at home. Pt was educated on and agreed to plan of care.  Person educated: Patient Education method: Explanation, Demonstration, Tactile cues, Verbal cues, and Handouts Education comprehension: verbalized understanding, returned demonstration, verbal cues required, and tactile cues required  HOME EXERCISE PROGRAM: Cervical Rotation Snag 3 sets 12-20 reps Sleeper's internal rotation stretch for R  shoulder 3 sets of 30 second holds Thoracic extensions with hands behind head, 3 sets of 12-20 reps   ASSESSMENT:  CLINICAL IMPRESSION: PT reassessed goals this session where patient has met all goals to safely d/c formal PT. Patient is able to demonstrate and verbalize understanding of all HEP recommendations with minimal corrections needed. Pt given clinic contact info should further questions or concerns arise. Pt to d/c PT.      OBJECTIVE IMPAIRMENTS: decreased mobility, decreased ROM, decreased strength, and hypomobility.   ACTIVITY LIMITATIONS: lifting, bathing, and reach over  head  PARTICIPATION LIMITATIONS: community activity and occupation  PERSONAL FACTORS: Age, Fitness, Profession, and 1 comorbidity: Scleroderma  are also affecting patient's functional outcome.   REHAB POTENTIAL: Good  CLINICAL DECISION MAKING: Evolving/moderate complexity  EVALUATION COMPLEXITY: Moderate   GOALS: Goals reviewed with patient? No  SHORT TERM GOALS: Target date: 07/01/2022  Pt will be independent with HEP in order to improve strength and balance in order to decrease fall risk and improve function at home and work.  Baseline: HEP given 06/03/2022 Goal status: INITIAL   LONG TERM GOALS: Target date: 07/29/2021  Patient will increase FOTO score to 66 to demonstrate predicted increase in functional mobility to complete ADLs.  Baseline: 55; 07/06/22 66 Goal status: MET  2.  Pt will demonstrate  and increase to at least 20 degrees of lateral flexion on both sides for improved community ambulation. Baseline: 06/03/2022: L:16 R:15; 07/06/22 L  40 R 36 Goal status: MET  3.  Pt will increase all UE and parascapular MMT's to a level of at least 4+ for increased independence with ADL and improved level of function during exercise classes. Baseline: See eval MMT chart; 12/20 Y: 4+5; 5/5 for T and all other shoulder MMTs Goal status: MET  4. Pt will report waking up with out upper back tightness/pain 4 out of 7 days for increased QoL.  Baseline: 06/03/2022: almost every morning; 07/06/22 5x/week   Goal status: ONGOING   PLAN:  PT FREQUENCY: 1-2x/week  PT DURATION: 8 weeks  PLANNED INTERVENTIONS: Therapeutic exercises, Therapeutic activity, Neuromuscular re-education, Balance training, Gait training, Patient/Family education, Self Care, Joint mobilization, Dry Needling, and Manual therapy  PLAN FOR NEXT SESSION: Manual PPIVM of cervical spine    Durwin Reges DPT  Durwin Reges, PT 07/25/2022, 12:58 PM  12:58 PM, 07/25/22

## 2022-07-25 NOTE — Telephone Encounter (Signed)
S/w pt - denied virtual - stated wants to give medication a little more time.

## 2022-07-25 NOTE — Telephone Encounter (Signed)
Pt given antibiotic (Macrobid) and pyridium by virtual dr.  Pt still w/ symptoms of urgency, frequency, and burning on urination Pt denies fever, back pain, hematuria. Has only taken 2 days of abx. Takes last pill Thursday.  Pt advised to monitor symptoms, continue abx, if no relief after finishing abx, call.

## 2022-07-25 NOTE — Telephone Encounter (Signed)
If she is still having persistent symptoms, I am ok with rechecking urine and scheduling add on virtual still if desires.

## 2022-07-27 ENCOUNTER — Encounter: Payer: BC Managed Care – PPO | Admitting: Physical Therapy

## 2022-08-03 ENCOUNTER — Encounter: Payer: BC Managed Care – PPO | Admitting: Physical Therapy

## 2022-08-05 ENCOUNTER — Encounter: Payer: BC Managed Care – PPO | Admitting: Physical Therapy

## 2022-08-08 ENCOUNTER — Other Ambulatory Visit: Payer: Self-pay | Admitting: Internal Medicine

## 2022-08-10 ENCOUNTER — Encounter: Payer: BC Managed Care – PPO | Admitting: Physical Therapy

## 2022-08-12 ENCOUNTER — Telehealth: Payer: Self-pay

## 2022-08-12 ENCOUNTER — Encounter: Payer: BC Managed Care – PPO | Admitting: Physical Therapy

## 2022-08-12 NOTE — Telephone Encounter (Signed)
Patient tested positive today for COVID. Was exposed to COVID last week. C/O PND & sinus headache.   Pt states that she has had COVID before & the Paxlovid was worse than the symptoms. She does not want the antiviral treatment. She would like to keep virtual visit next week.  She is aware that if her symptoms worsen (such as trouble breathing, severe weakness, etc) to go to urgent care or ED for evaluation over the weekend.

## 2022-08-15 ENCOUNTER — Telehealth (INDEPENDENT_AMBULATORY_CARE_PROVIDER_SITE_OTHER): Payer: BC Managed Care – PPO | Admitting: Internal Medicine

## 2022-08-15 ENCOUNTER — Encounter: Payer: Self-pay | Admitting: Internal Medicine

## 2022-08-15 VITALS — BP 137/75 | HR 75 | Temp 98.1°F | Ht 64.5 in | Wt 162.0 lb

## 2022-08-15 DIAGNOSIS — F419 Anxiety disorder, unspecified: Secondary | ICD-10-CM

## 2022-08-15 DIAGNOSIS — K2100 Gastro-esophageal reflux disease with esophagitis, without bleeding: Secondary | ICD-10-CM | POA: Diagnosis not present

## 2022-08-15 DIAGNOSIS — M5030 Other cervical disc degeneration, unspecified cervical region: Secondary | ICD-10-CM

## 2022-08-15 DIAGNOSIS — Z860100 Personal history of colon polyps, unspecified: Secondary | ICD-10-CM

## 2022-08-15 DIAGNOSIS — M349 Systemic sclerosis, unspecified: Secondary | ICD-10-CM

## 2022-08-15 DIAGNOSIS — U071 COVID-19: Secondary | ICD-10-CM

## 2022-08-15 DIAGNOSIS — L98491 Non-pressure chronic ulcer of skin of other sites limited to breakdown of skin: Secondary | ICD-10-CM | POA: Diagnosis not present

## 2022-08-15 NOTE — Progress Notes (Signed)
Patient ID: Denise Arnold, female   DOB: 07/07/1961, 62 y.o.   MRN: [id number]   Virtual Visit via video Note  All issues noted in this document were discussed and addressed.  No physical exam was performed (except for noted visual exam findings with Video Visits).   I connected with Denise Arnold by a video enabled telemedicine application and verified that I am speaking with the correct person using two identifiers. Location patient: home Location provider: work  Persons participating in the virtual visit: patient, provider  The limitations, risks, security and privacy concerns of performing an evaluation and management service by video and the availability of in person appointments have been discussed. It has also been discussed with the patient that there may be a patient responsible charge related to this service. The patient expressed understanding and agreed to proceed.   Reason for visit: follow up appt  HPI: Follow up regarding scleroderma and anxiety.  Saw Dr Clayborn Bigness 08/08/22 - f/u sob.  S/p echo and holter.  Per note, no significant abnormality.  Seeing Dr Posey Pronto - f/u scleroderma - 07/12/22 (last appt).  Continues on sildenafil.  Considering botox - through dermatology - if fingertip ulcer persist.  Recent UTI - treated with macrobid. Has been working with PT - neck and shoulder pain.  Started having some drainage - 5-6 days ago. Tested positive for covid 08/12/22.  Increased sinus pressure.  Temp 99.5.  took tylenol.  Loose stool the previous week.  Is feeling better.  No chest pain or sob.    ROS: See pertinent positives and negatives per HPI.  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HCC)    Scleroderma (HCC)    positive FANA, positive SCL-70 abs, raynauds, mild pulmonary hypertension, normal DLCO   Tricuspid regurgitation    Vitamin D deficiency      Past Surgical History:  Procedure Laterality Date   CESAREAN SECTION  1994   COLONOSCOPY WITH PROPOFOL N/A 05/29/2015   Procedure: COLONOSCOPY WITH PROPOFOL;  Surgeon: Manya Silvas, MD;  Location: [place];  Service: Endoscopy;  Laterality: N/A;   DILATION AND CURETTAGE OF UTERUS  1992   DILATION AND CURETTAGE OF UTERUS  1993   DILATION AND CURETTAGE OF UTERUS  2011   VEIN SURGERY      Family History  Problem Relation Age of Onset   Heart disease Father        myocardial infarction   Arthritis/Rheumatoid Father    Hypertension Father    Hyperlipidemia Mother    Breast cancer Maternal Grandmother    Colon cancer Neg Hx     SOCIAL HX: reviewed.    Current Outpatient Medications:    azelastine (ASTELIN) 0.1 % nasal spray, PLACE 1 SPRAY INTO BOTH NOSTRILS 2 (TWO) TIMES DAILY. USE IN EACH NOSTRIL AS DIRECTED, Disp: 30 mL, Rfl: 1   Biotin 5000 MCG TABS, , Disp: , Rfl:    Calcium-Magnesium-Vitamin D (CALCIUM 500 PO), Take by mouth., Disp: , Rfl:    Cholecalciferol (VITAMIN D) 50 MCG (2000 UT) tablet, Take 2,000 Units by mouth daily., Disp: , Rfl:    ciclopirox (LOPROX) 0.77 % cream, Apply topically daily., Disp: 30 g, Rfl: 0   clobetasol cream (TEMOVATE) 0.05 %, Apply to elbows twice a day as needed. Avoid face, groin, axilla., Disp: 30 g, Rfl: 0  Cranberry 400 MG CAPS, Take by mouth., Disp: , Rfl:    fish oil-omega-3 fatty acids 1000 MG capsule, Take 1 g by mouth daily., Disp: , Rfl:    gabapentin (NEURONTIN) 100 MG capsule, TAKE 2 CAPSULES BY MOUTH AT BEDTIME, Disp: 60 capsule, Rfl: 1   MISC NATURAL PRODUCTS PO, Herbal Supplement, Disp: , Rfl:    Multiple Vitamin (MULTIVITAMIN) tablet, Take 1 tablet by mouth daily., Disp: , Rfl:    mupirocin ointment (BACTROBAN) 2 %, Apply topically 3 (three) times daily. Left elbow, Disp: 30 g, Rfl: 0   nitroGLYCERIN (NITROGLYN) 2 % ointment, Apply 0.5 inches topically 3 (three) times daily as needed. , Disp: , Rfl:    omeprazole  (PRILOSEC) 20 MG capsule, Take 20 mg by mouth 2 (two) times daily. , Disp: , Rfl:    Probiotic Product (PROBIOTIC PO), Take by mouth., Disp: , Rfl:    RYALTRIS 665-25 MCG/ACT SUSP, INSTILL 2 SPRAYS IN EACH NOSTRIL TWICE DAILY, Disp: , Rfl:    sildenafil (REVATIO) 20 MG tablet, Take by mouth., Disp: , Rfl:    tacrolimus (PROTOPIC) 0.1 % ointment, APPLY TO AFFECTED AREAS ONCE TO TWICE A DAY., Disp: 60 g, Rfl: 1   Tavaborole (KERYDIN) 5 % SOLN, Apply to affected toenail every night until improved., Disp: 10 mL, Rfl: 2  EXAM:  VITALS per patient if applicable: [id number], [id number], 75  GENERAL: alert, oriented, appears well and in no acute distress  HEENT: atraumatic, conjunttiva clear, no obvious abnormalities on inspection of external nose and ears  NECK: normal movements of the head and neck  LUNGS: on inspection no signs of respiratory distress, breathing rate appears normal, no obvious gross SOB, gasping or wheezing  CV: no obvious cyanosis  PSYCH/NEURO: pleasant and cooperative, no obvious depression or anxiety, speech and thought processing grossly intact  ASSESSMENT AND PLAN:  Discussed the following assessment and plan:  Problem List Items Addressed This Visit     Anxiety - Primary    Doing better.  On gabapentin.  Continue.  Follow.       COVID-19 virus infection    Diagnosed 08/10/22.  No chest pain or sob.  No increased cough or congestion.  Saline nasal spray and steroid nasal spray.  Continue tylenol.  Robitussin if needed.  Follow.  Discussed quarantine guidelines.       Degenerative cervical disc    PT.  Follow.       Finger ulcer ([place])    Seeing dermatology.  Tacrolimus and bactroban. Planning to discuss botox - appt 08/29/22      GERD (gastroesophageal reflux disease)    No acid reflux reported.  Prilosec.       History of colonic polyps    Colonoscopy 03/2021.       Scleroderma ([place])    Followed by rheumatology.  Now seeing Dr Posey Pronto and Dr Brigitte Pulse [facility]).          Return in about 3 months (around 11/14/2022) for follow-up.   I discussed the assessment and treatment plan with the patient. The patient was provided an opportunity to ask questions and all were answered. The patient agreed with the plan and demonstrated an understanding of the instructions.   The patient was advised to call back or seek an in-person evaluation if the symptoms worsen or if the condition fails to improve as anticipated.   Einar Pheasant, MD

## 2022-08-17 ENCOUNTER — Encounter: Payer: BC Managed Care – PPO | Admitting: Physical Therapy

## 2022-08-19 ENCOUNTER — Encounter: Payer: BC Managed Care – PPO | Admitting: Physical Therapy

## 2022-08-19 ENCOUNTER — Other Ambulatory Visit: Payer: Self-pay | Admitting: Dermatology

## 2022-08-19 DIAGNOSIS — M349 Systemic sclerosis, unspecified: Secondary | ICD-10-CM

## 2022-08-28 ENCOUNTER — Encounter: Payer: Self-pay | Admitting: Internal Medicine

## 2022-08-28 DIAGNOSIS — U071 COVID-19: Secondary | ICD-10-CM | POA: Insufficient documentation

## 2022-08-28 NOTE — Assessment & Plan Note (Signed)
Seeing dermatology.  Tacrolimus and bactroban. Planning to discuss botox - appt 08/29/22

## 2022-08-28 NOTE — Assessment & Plan Note (Signed)
Colonoscopy 03/2021.

## 2022-08-28 NOTE — Assessment & Plan Note (Signed)
Diagnosed 08/10/22.  No chest pain or sob.  No increased cough or congestion.  Saline nasal spray and steroid nasal spray.  Continue tylenol.  Robitussin if needed.  Follow.  Discussed quarantine guidelines.

## 2022-08-28 NOTE — Assessment & Plan Note (Signed)
Doing better.  On gabapentin.  Continue.  Follow.

## 2022-08-28 NOTE — Assessment & Plan Note (Signed)
Followed by rheumatology.  Now seeing Dr Posey Pronto and Dr Brigitte Pulse [facility]).

## 2022-08-28 NOTE — Assessment & Plan Note (Signed)
No acid reflux reported.  Prilosec.

## 2022-08-28 NOTE — Assessment & Plan Note (Signed)
PT.  Follow.

## 2022-10-06 ENCOUNTER — Other Ambulatory Visit: Payer: Self-pay | Admitting: Internal Medicine

## 2022-10-10 ENCOUNTER — Ambulatory Visit: Payer: BC Managed Care – PPO | Admitting: Internal Medicine

## 2022-10-24 ENCOUNTER — Ambulatory Visit (INDEPENDENT_AMBULATORY_CARE_PROVIDER_SITE_OTHER): Payer: BC Managed Care – PPO | Admitting: Internal Medicine

## 2022-10-24 ENCOUNTER — Ambulatory Visit (INDEPENDENT_AMBULATORY_CARE_PROVIDER_SITE_OTHER): Payer: BC Managed Care – PPO

## 2022-10-24 ENCOUNTER — Encounter: Payer: Self-pay | Admitting: Internal Medicine

## 2022-10-24 VITALS — BP 110/70 | HR 75 | Temp 97.8°F | Resp 17 | Ht 64.5 in | Wt 165.0 lb

## 2022-10-24 DIAGNOSIS — M349 Systemic sclerosis, unspecified: Secondary | ICD-10-CM

## 2022-10-24 DIAGNOSIS — Z13220 Encounter for screening for lipoid disorders: Secondary | ICD-10-CM | POA: Diagnosis not present

## 2022-10-24 DIAGNOSIS — R109 Unspecified abdominal pain: Secondary | ICD-10-CM

## 2022-10-24 DIAGNOSIS — J3489 Other specified disorders of nose and nasal sinuses: Secondary | ICD-10-CM

## 2022-10-24 DIAGNOSIS — I7300 Raynaud's syndrome without gangrene: Secondary | ICD-10-CM

## 2022-10-24 DIAGNOSIS — L98499 Non-pressure chronic ulcer of skin of other sites with unspecified severity: Secondary | ICD-10-CM | POA: Diagnosis not present

## 2022-10-24 DIAGNOSIS — F419 Anxiety disorder, unspecified: Secondary | ICD-10-CM

## 2022-10-24 DIAGNOSIS — L98491 Non-pressure chronic ulcer of skin of other sites limited to breakdown of skin: Secondary | ICD-10-CM

## 2022-10-24 DIAGNOSIS — Z1231 Encounter for screening mammogram for malignant neoplasm of breast: Secondary | ICD-10-CM

## 2022-10-24 DIAGNOSIS — Z860100 Personal history of colon polyps, unspecified: Secondary | ICD-10-CM

## 2022-10-24 DIAGNOSIS — K2100 Gastro-esophageal reflux disease with esophagitis, without bleeding: Secondary | ICD-10-CM

## 2022-10-24 LAB — CBC WITH DIFFERENTIAL/PLATELET
Basophils Absolute: 0 10*3/uL (ref 0.0–0.1)
Basophils Relative: 0.4 % (ref 0.0–3.0)
Eosinophils Absolute: 0.1 10*3/uL (ref 0.0–0.7)
Eosinophils Relative: 0.9 % (ref 0.0–5.0)
HCT: 41.6 % (ref 36.0–46.0)
Hemoglobin: 13.7 g/dL (ref 12.0–15.0)
Lymphocytes Relative: 17.1 % (ref 12.0–46.0)
Lymphs Abs: 1.6 10*3/uL (ref 0.7–4.0)
MCHC: 32.8 g/dL (ref 30.0–36.0)
MCV: 91.1 fl (ref 78.0–100.0)
Monocytes Absolute: 0.6 10*3/uL (ref 0.1–1.0)
Monocytes Relative: 6.9 % (ref 3.0–12.0)
Neutro Abs: 7 10*3/uL (ref 1.4–7.7)
Neutrophils Relative %: 74.7 % (ref 43.0–77.0)
Platelets: 267 10*3/uL (ref 150.0–400.0)
RBC: 4.57 Mil/uL (ref 3.87–5.11)
RDW: 14.3 % (ref 11.5–15.5)
WBC: 9.3 10*3/uL (ref 4.0–10.5)

## 2022-10-24 LAB — HEPATIC FUNCTION PANEL
ALT: 13 U/L (ref 0–35)
AST: 19 U/L (ref 0–37)
Albumin: 4.3 g/dL (ref 3.5–5.2)
Alkaline Phosphatase: 69 U/L (ref 39–117)
Bilirubin, Direct: 0.1 mg/dL (ref 0.0–0.3)
Total Bilirubin: 0.4 mg/dL (ref 0.2–1.2)
Total Protein: 6.7 g/dL (ref 6.0–8.3)

## 2022-10-24 LAB — BASIC METABOLIC PANEL
BUN: 10 mg/dL (ref 6–23)
CO2: 28 mEq/L (ref 19–32)
Calcium: 9.5 mg/dL (ref 8.4–10.5)
Chloride: 103 mEq/L (ref 96–112)
Creatinine, Ser: 0.89 mg/dL (ref 0.40–1.20)
GFR: 70.24 mL/min (ref >60.00)
Glucose, Bld: 85 mg/dL (ref 70–99)
Potassium: 4.3 mEq/L (ref 3.5–5.1)
Sodium: 140 mEq/L (ref 135–145)

## 2022-10-24 LAB — LIPID PANEL
Cholesterol: 210 mg/dL — ABNORMAL HIGH (ref 0–200)
HDL: 54.9 mg/dL (ref >39.00)
NonHDL: 154.62
Total CHOL/HDL Ratio: 4
Triglycerides: 302 mg/dL — ABNORMAL HIGH (ref 0.0–149.0)
VLDL: 60.4 mg/dL — ABNORMAL HIGH (ref 0.0–40.0)

## 2022-10-24 LAB — LDL CHOLESTEROL, DIRECT: Direct LDL: 113 mg/dL

## 2022-10-24 MED ORDER — DOXYCYCLINE HYCLATE 100 MG PO TABS
100.0000 mg | ORAL_TABLET | Freq: Two times a day (BID) | ORAL | 0 refills | Status: DC
Start: 2022-10-24 — End: 2023-03-17

## 2022-10-24 NOTE — Progress Notes (Signed)
Subjective:    Patient ID: Denise Arnold, female    DOB: December 23, 1960, 62 y.o.   MRN: [id number]  Patient here for  Chief Complaint  Patient presents with   Follow-up    7 week f/u   Sore Throat    C/O dryness (denies soreness)-completed a z-pack taper about 7-10 days ago for a sore throat. Took Zyrtec for sinus issues.    HPI Here for follow up - regarding increased stress and anxiety.  Has seen Dr Juliann Pares recently for sob.  S/p echo and holter. Per note, no significant abnormality. Seeing Dr Allena Katz - f/u scleroderma - 07/12/22 (last appt). Continues on sildenafil. Recommended botox - through dermatology - if fingertip ulcer persist. Saw dermatology 08/29/22 - recommended compound medication - topically.  Reports 3/28 - strep - treated with zpak.  Several days ago - sore throat and some increased sinus pressure.  Used guaifenesin. Reports clearing throat.  Now using flonase and took xyzal last pm.  No chest pain or increased sob reported.  Prilosec controlling acid reflux.  Reports some pain - left side - gas.  No change with bowel movements or eating.  Has had normal bowel movements.  Described as a soreness.  No nausea or vomiting reported.  Some change in vision.  No loss of vision.  Seeing eye MD.  Was questioning if related to gabapentin.  Discussed tapering dose.  Is not sure it is helping.  Some soreness - base of left thumb.     Past Medical History:  Diagnosis Date   Arthritis    GERD (gastroesophageal reflux disease)    History of endoscopy 04/05/2013   upper   Menorrhagia    Reynolds syndrome    Scleroderma    positive FANA, positive SCL-70 abs, raynauds, mild pulmonary hypertension, normal DLCO   Tricuspid regurgitation    Vitamin D deficiency    Past Surgical History:  Procedure Laterality Date   CESAREAN SECTION  1994   COLONOSCOPY WITH PROPOFOL N/A 05/29/2015   Procedure: COLONOSCOPY WITH PROPOFOL;  Surgeon: Scot Jun, MD;  Location: [facility] ENDOSCOPY;  Service:  Endoscopy;  Laterality: N/A;   DILATION AND CURETTAGE OF UTERUS  1992   DILATION AND CURETTAGE OF UTERUS  1993   DILATION AND CURETTAGE OF UTERUS  2011   VEIN SURGERY     Family History  Problem Relation Age of Onset   Heart disease Father        myocardial infarction   Arthritis/Rheumatoid Father    Hypertension Father    Hyperlipidemia Mother    Breast cancer Maternal Grandmother    Colon cancer Neg Hx    Social History   Socioeconomic History   Marital status: Married    Spouse name: Not on file   Number of children: 1   Years of education: Not on file   Highest education level: Bachelor's degree (e.g., BA, AB, BS)  Occupational History   Not on file  Tobacco Use   Smoking status: Never   Smokeless tobacco: Never  Vaping Use   Vaping Use: Never used  Substance and Sexual Activity   Alcohol use: No    Alcohol/week: 0.0 standard drinks of alcohol   Drug use: No   Sexual activity: Not on file  Other Topics Concern   Not on file  Social History Narrative   Not on file   Social Determinants of Health   Financial Resource Strain: Low Risk  (10/20/2022)   Overall Physicist, medical Strain (  CARDIA)    Difficulty of Paying Living Expenses: Not hard at all  Food Insecurity: No Food Insecurity (10/20/2022)   Hunger Vital Sign    Worried About Running Out of Food in the Last Year: Never true    Ran Out of Food in the Last Year: Never true  Transportation Needs: No Transportation Needs (10/20/2022)   PRAPARE - Administrator, Civil ServiceTransportation    Lack of Transportation (Medical): No    Lack of Transportation (Non-Medical): No  Physical Activity: Sufficiently Active (10/20/2022)   Exercise Vital Sign    Days of Exercise per Week: 4 days    Minutes of Exercise per Session: 50 min  Stress: Stress Concern Present (10/20/2022)   [employer] - Occupational Stress Questionnaire    Feeling of Stress : To some extent  Social Connections: Socially Integrated (10/20/2022)    Social Connection and Isolation Panel [NHANES]    Frequency of Communication with Friends and Family: More than three times a week    Frequency of Social Gatherings with Friends and Family: Once a week    Attends Religious Services: More than 4 times per year    Active Member of [employer]ubs or Organizations: Yes    Attends Engineer, structuralClub or Organization Meetings: More than 4 times per year    Marital Status: Married     Review of Systems  Constitutional:  Negative for appetite change, fever and unexpected weight change.  HENT:  Positive for congestion, postnasal drip and sore throat.   Respiratory:  Negative for cough, chest tightness and shortness of breath.   Cardiovascular:  Negative for chest pain and palpitations.  Gastrointestinal:  Negative for nausea and vomiting.       Left side soreness as outlined.    Genitourinary:  Negative for difficulty urinating and dysuria.  Musculoskeletal:  Negative for myalgias.       Pain base of left thumb as outlined.   Skin:  Negative for color change and rash.  Neurological:  Negative for dizziness and headaches.  Psychiatric/Behavioral:  Negative for agitation and dysphoric mood.        Objective:     BP 110/70   Pulse 75   Temp 97.8 F (36.6 C) (Oral)   Resp 17   Ht 5' 4.5" (1.638 m)   Wt 165 lb (74.8 kg)   LMP 02/14/2010   SpO2 97%   BMI 27.88 kg/m  Wt Readings from Last 3 Encounters:  10/24/22 165 lb (74.8 kg)  08/15/22 162 lb (73.5 kg)  06/17/22 166 lb 9.6 oz (75.6 kg)    Physical Exam Vitals reviewed.  Constitutional:      General: She is not in acute distress.    Appearance: Normal appearance.  HENT:     Head: Normocephalic and atraumatic.     Right Ear: External ear normal.     Left Ear: External ear normal.  Eyes:     General: No scleral icterus.       Right eye: No discharge.        Left eye: No discharge.     Conjunctiva/sclera: Conjunctivae normal.  Neck:     Thyroid: No thyromegaly.  Cardiovascular:     Rate and  Rhythm: Normal rate and regular rhythm.  Pulmonary:     Effort: No respiratory distress.     Breath sounds: Normal breath sounds. No wheezing.  Abdominal:     General: Bowel sounds are normal.     Palpations: Abdomen is soft.  Tenderness: There is no abdominal tenderness.  Musculoskeletal:        General: No swelling or tenderness.     Cervical back: Neck supple. No tenderness.  Lymphadenopathy:     Cervical: No cervical adenopathy.  Skin:    Findings: No erythema or rash.  Neurological:     Mental Status: She is alert.  Psychiatric:        Mood and Affect: Mood normal.        Behavior: Behavior normal.      Outpatient Encounter Medications as of 10/24/2022  Medication Sig   azelastine (ASTELIN) 0.1 % nasal spray PLACE 1 SPRAY INTO BOTH NOSTRILS 2 (TWO) TIMES DAILY. USE IN EACH NOSTRIL AS DIRECTED   Biotin 5000 MCG TABS    Calcium-Magnesium-Vitamin D (CALCIUM 500 PO) Take by mouth.   Cholecalciferol (VITAMIN D) 50 MCG (2000 UT) tablet Take 2,000 Units by mouth daily.   ciclopirox (LOPROX) 0.77 % cream Apply topically daily.   clobetasol cream (TEMOVATE) 0.05 % Apply to elbows twice a day as needed. Avoid face, groin, axilla.   Cranberry 400 MG CAPS Take by mouth.   doxycycline (VIBRA-TABS) 100 MG tablet Take 1 tablet (100 mg total) by mouth 2 (two) times daily.   fish oil-omega-3 fatty acids 1000 MG capsule Take 1 g by mouth daily.   gabapentin (NEURONTIN) 100 MG capsule TAKE 2 CAPSULES BY MOUTH AT BEDTIME   LORazepam (ATIVAN) 0.5 MG tablet Take 0.25 mg by mouth every 8 (eight) hours as needed for anxiety.   MISC NATURAL PRODUCTS PO Herbal Supplement   Multiple Vitamin (MULTIVITAMIN) tablet Take 1 tablet by mouth daily.   mupirocin ointment (BACTROBAN) 2 % Apply topically 3 (three) times daily. Left elbow   nitroGLYCERIN (NITROGLYN) 2 % ointment Apply 0.5 inches topically 3 (three) times daily as needed.    omeprazole (PRILOSEC) 20 MG capsule Take 20 mg by mouth 2 (two)  times daily.    Probiotic Product (PROBIOTIC PO) Take by mouth.   RYALTRIS 665-25 MCG/ACT SUSP INSTILL 2 SPRAYS IN EACH NOSTRIL TWICE DAILY   sildenafil (REVATIO) 20 MG tablet Take by mouth.   tacrolimus (PROTOPIC) 0.1 % ointment APPLY TO AFFECTED AREAS ONCE TO TWICE A DAY.   Tavaborole (KERYDIN) 5 % SOLN Apply to affected toenail every night until improved.   No facility-administered encounter medications on file as of 10/24/2022.     Lab Results  Component Value Date   WBC 9.3 10/24/2022   HGB 13.7 10/24/2022   HCT 41.6 10/24/2022   PLT 267.0 10/24/2022   GLUCOSE 85 10/24/2022   CHOL 210 (H) 10/24/2022   TRIG 302.0 (H) 10/24/2022   HDL 54.90 10/24/2022   LDLDIRECT 113.0 10/24/2022   LDLCALC 115 (H) 05/17/2022   ALT 13 10/24/2022   AST 19 10/24/2022   NA 140 10/24/2022   K 4.3 10/24/2022   CL 103 10/24/2022   CREATININE 0.89 10/24/2022   BUN 10 10/24/2022   CO2 28 10/24/2022   TSH 1.98 12/28/2021    DG Knee 1-2 Views Left  Result Date: 06/19/2022 CLINICAL DATA:  Left knee pain EXAM: LEFT KNEE - 1-2 VIEW COMPARISON:  None Available. FINDINGS: No fracture or dislocation of the left knee. Minimal patellofemoral arthrosis. Otherwise preserved joint spaces. Small, nonspecific knee joint effusion. Soft tissues unremarkable. IMPRESSION: 1. No fracture or dislocation of the left knee. Minimal patellofemoral arthrosis. Otherwise preserved joint spaces. 2. Small, nonspecific knee joint effusion. Electronically Signed   By: Bonna GainsAlex D Bibbey M.D.  On: 06/19/2022 21:38       Assessment & Plan:  Encounter for screening mammogram for malignant neoplasm of breast -     3D Screening Mammogram, Left and Right; Future  Abdominal pain, unspecified abdominal location Assessment & Plan: Left side pain as outlined.  Does not appear to be affected by eating or bowel movements.  Having bowel movements.  No significant pain to palpation.  Check KUB.  Further w/up pending results.  Monitor for  triggers.   Orders: -     DG Abd 1 View; Future -     CBC with Differential/Platelet -     Basic metabolic panel -     Hepatic function panel  Screening cholesterol level -     Lipid panel  Anxiety Assessment & Plan: Feels gabapentin may be affecting her vision.  Will decrease to one q hs.  Follow.     Non-pressure chronic ulcer of skin of other sites with unspecified severity  Scleroderma Assessment & Plan: Followed by rheumatology.  Now seeing Dr Allena Katz and Dr Clelia Croft [facility]).     Skin ulcer of finger, limited to breakdown of skin Assessment & Plan: Seeing dermatology - local. Tacrolimus and bactroban. Saw dermatology ([facility]) referred by rheumatology - recommended compound medication - topically.    Gastroesophageal reflux disease with esophagitis without hemorrhage Assessment & Plan: No acid reflux reported.  Prilosec.    History of colonic polyps Assessment & Plan: Colonoscopy 03/2021.    Raynaud's phenomenon without gangrene Assessment & Plan: On sildenafill.  Seeing rheumatology and dermatology as outlined.    Sinus pressure Assessment & Plan: Increased sinus pressure and congestion and sore throat.  Symptoms appear to be c/w sinus infection.  Lungs clear.  Saline nasal spray/flonase as directed.  Doxycycline.  Guaifenesin. Follow.     Other orders -     Doxycycline Hyclate; Take 1 tablet (100 mg total) by mouth 2 (two) times daily.  Dispense: 14 tablet; Refill: 0 -     LDL cholesterol, direct     Dale [place], MD

## 2022-10-25 NOTE — Telephone Encounter (Signed)
See previous result note.  I do not think the short course of prednisone nor the zpak caused the elevation.  We will do the lab fasting next time to confirm the food did not affect

## 2022-10-25 NOTE — Telephone Encounter (Signed)
Please see lab result note and notify pt that I do not feel the triglycerides are causing her head to hurt.  I do not feel the gabapentin is causing the elevation in triglycerides.  We will do a true fasting lab next time we check cholesterol to confirm the creamer, etc - did not contribute.

## 2022-10-30 ENCOUNTER — Encounter: Payer: Self-pay | Admitting: Internal Medicine

## 2022-10-30 DIAGNOSIS — J3489 Other specified disorders of nose and nasal sinuses: Secondary | ICD-10-CM | POA: Insufficient documentation

## 2022-10-30 NOTE — Assessment & Plan Note (Signed)
Colonoscopy 03/2021.  

## 2022-10-30 NOTE — Assessment & Plan Note (Signed)
Followed by rheumatology.  Now seeing Dr Patel and Dr Shaw ([place]).   

## 2022-10-30 NOTE — Assessment & Plan Note (Signed)
Left side pain as outlined.  Does not appear to be affected by eating or bowel movements.  Having bowel movements.  No significant pain to palpation.  Check KUB.  Further w/up pending results.  Monitor for triggers.

## 2022-10-30 NOTE — Assessment & Plan Note (Signed)
Increased sinus pressure and congestion and sore throat.  Symptoms appear to be c/w sinus infection.  Lungs clear.  Saline nasal spray/flonase as directed.  Doxycycline.  Guaifenesin. Follow.

## 2022-10-30 NOTE — Assessment & Plan Note (Signed)
Seeing dermatology - local. Tacrolimus and bactroban. Saw dermatology ([facility]) referred by rheumatology - recommended compound medication - topically.

## 2022-10-30 NOTE — Assessment & Plan Note (Signed)
On sildenafill.  Seeing rheumatology and dermatology as outlined.

## 2022-10-30 NOTE — Assessment & Plan Note (Signed)
Feels gabapentin may be affecting her vision.  Will decrease to one q hs.  Follow.

## 2022-10-30 NOTE — Assessment & Plan Note (Signed)
No acid reflux reported.  Prilosec.  

## 2022-12-05 ENCOUNTER — Other Ambulatory Visit: Payer: Self-pay | Admitting: Internal Medicine

## 2022-12-21 ENCOUNTER — Telehealth: Payer: Self-pay | Admitting: Internal Medicine

## 2022-12-21 NOTE — Telephone Encounter (Signed)
Called patient to clarify. Daughter in law dx wqith shingles this week. She was having no symptoms last time patient was around her. Patient is not currently having any active symptoms. Explained to patient per Dr Lorin Picket if not having any active symptoms then no contraindication to getting vaccine but also explained that this vaccine will not prevent her from getting shingles. Patient gave verbal understanding.

## 2022-12-21 NOTE — Telephone Encounter (Signed)
Pt called stating her daughter in law has shingles and she has not been around he since sunday and she is in the early stages of it. Pt want to know if she should she get a shot or is it too soon

## 2023-01-23 ENCOUNTER — Encounter: Payer: Self-pay | Admitting: Internal Medicine

## 2023-01-25 ENCOUNTER — Ambulatory Visit: Payer: BC Managed Care – PPO | Admitting: Internal Medicine

## 2023-01-25 ENCOUNTER — Ambulatory Visit (INDEPENDENT_AMBULATORY_CARE_PROVIDER_SITE_OTHER): Payer: BC Managed Care – PPO | Admitting: Internal Medicine

## 2023-01-25 ENCOUNTER — Encounter: Payer: Self-pay | Admitting: Internal Medicine

## 2023-01-25 VITALS — BP 110/70 | HR 74 | Temp 97.9°F | Resp 16 | Ht 64.5 in | Wt 158.0 lb

## 2023-01-25 DIAGNOSIS — M349 Systemic sclerosis, unspecified: Secondary | ICD-10-CM | POA: Diagnosis not present

## 2023-01-25 DIAGNOSIS — E7800 Pure hypercholesterolemia, unspecified: Secondary | ICD-10-CM | POA: Diagnosis not present

## 2023-01-25 DIAGNOSIS — L98491 Non-pressure chronic ulcer of skin of other sites limited to breakdown of skin: Secondary | ICD-10-CM

## 2023-01-25 DIAGNOSIS — F419 Anxiety disorder, unspecified: Secondary | ICD-10-CM

## 2023-01-25 DIAGNOSIS — R0989 Other specified symptoms and signs involving the circulatory and respiratory systems: Secondary | ICD-10-CM

## 2023-01-25 DIAGNOSIS — R0602 Shortness of breath: Secondary | ICD-10-CM

## 2023-01-25 DIAGNOSIS — Z860100 Personal history of colon polyps, unspecified: Secondary | ICD-10-CM

## 2023-01-25 DIAGNOSIS — K2100 Gastro-esophageal reflux disease with esophagitis, without bleeding: Secondary | ICD-10-CM

## 2023-01-25 LAB — LIPID PANEL
Cholesterol: 190 mg/dL (ref 0–200)
HDL: 59.6 mg/dL (ref >39.00)
LDL Cholesterol: 112 mg/dL — ABNORMAL HIGH (ref 0–99)
NonHDL: 130.57
Total CHOL/HDL Ratio: 3
Triglycerides: 94 mg/dL (ref 0.0–149.0)
VLDL: 18.8 mg/dL (ref 0.0–40.0)

## 2023-01-25 LAB — CBC WITH DIFFERENTIAL/PLATELET
Basophils Absolute: 0 10*3/uL (ref 0.0–0.1)
Basophils Relative: 0.6 % (ref 0.0–3.0)
Eosinophils Absolute: 0.1 10*3/uL (ref 0.0–0.7)
Eosinophils Relative: 1.2 % (ref 0.0–5.0)
HCT: 40 % (ref 36.0–46.0)
Hemoglobin: 13.1 g/dL (ref 12.0–15.0)
Lymphocytes Relative: 26.6 % (ref 12.0–46.0)
Lymphs Abs: 1.6 10*3/uL (ref 0.7–4.0)
MCHC: 32.6 g/dL (ref 30.0–36.0)
MCV: 92.4 fl (ref 78.0–100.0)
Monocytes Absolute: 0.5 10*3/uL (ref 0.1–1.0)
Monocytes Relative: 7.4 % (ref 3.0–12.0)
Neutro Abs: 3.9 10*3/uL (ref 1.4–7.7)
Neutrophils Relative %: 64.2 % (ref 43.0–77.0)
Platelets: 238 10*3/uL (ref 150.0–400.0)
RBC: 4.33 Mil/uL (ref 3.87–5.11)
RDW: 13.7 % (ref 11.5–15.5)
WBC: 6.1 10*3/uL (ref 4.0–10.5)

## 2023-01-25 LAB — BASIC METABOLIC PANEL
BUN: 15 mg/dL (ref 6–23)
CO2: 29 mEq/L (ref 19–32)
Calcium: 9.6 mg/dL (ref 8.4–10.5)
Chloride: 103 mEq/L (ref 96–112)
Creatinine, Ser: 0.86 mg/dL (ref 0.40–1.20)
GFR: 73.06 mL/min (ref >60.00)
Glucose, Bld: 89 mg/dL (ref 70–99)
Potassium: 4.2 mEq/L (ref 3.5–5.1)
Sodium: 139 mEq/L (ref 135–145)

## 2023-01-25 LAB — HEPATIC FUNCTION PANEL
ALT: 12 U/L (ref 0–35)
AST: 20 U/L (ref 0–37)
Albumin: 4.1 g/dL (ref 3.5–5.2)
Alkaline Phosphatase: 59 U/L (ref 39–117)
Bilirubin, Direct: 0.1 mg/dL (ref 0.0–0.3)
Total Bilirubin: 0.5 mg/dL (ref 0.2–1.2)
Total Protein: 6.7 g/dL (ref 6.0–8.3)

## 2023-01-25 LAB — TSH: TSH: 1.13 u[IU]/mL (ref 0.35–5.50)

## 2023-01-25 MED ORDER — BUSPIRONE HCL 5 MG PO TABS
5.0000 mg | ORAL_TABLET | Freq: Every day | ORAL | 1 refills | Status: DC
Start: 2023-01-25 — End: 2023-02-17

## 2023-01-25 NOTE — Progress Notes (Signed)
Subjective:    Patient ID: Denise Arnold, female    DOB: 09-21-1960, 62 y.o.   MRN: [id number]  Patient here for  Chief Complaint  Patient presents with   Medical Management of Chronic Issues    HPI Here for follow up - regarding increased stress and anxiety.  Has seen Dr Juliann Pares recently for sob.  S/p echo and holter. Per note, no significant abnormality. Seeing Dr Allena Katz - f/u scleroderma - 07/12/22 (last appt). Continues on sildenafil. Recommended botox - through dermatology - if fingertip ulcer persist. Saw dermatology 08/29/22 - recommended compound medication - topically.  Saw Dr Allena Katz 11/14/22 - f/u scleroderma.  Recommended to continue sildenafil.  Evaluated 12/24/22 - facial rash.treated with keflex. Developed itching.  Saw dermatology 12/29/22 - prescribed sarna anti itch. Also prescribed compound medication (5% sildenafil in petroleum jelly base). Does report increased joint pain - hands.  Has f/u with Dr Sherryll Burger tomorrow.  Breathing stable. No increased cough or congestion. No abdominal pain reported. Taking gabapentin.  With increased anxiety.  Discussed.  Feels needs something to help level things out. Discussed treatment options.    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HCC)    Scleroderma (HCC)    positive FANA, positive SCL-70 abs, raynauds, mild pulmonary hypertension, normal DLCO   Tricuspid regurgitation    Vitamin D deficiency    Past Surgical History:  Procedure Laterality Date   CESAREAN SECTION  1994   COLONOSCOPY WITH PROPOFOL N/A 05/29/2015   Procedure: COLONOSCOPY WITH PROPOFOL;  Surgeon: Scot Jun, MD;  Location: [facility] ENDOSCOPY;  Service: Endoscopy;  Laterality: N/A;   DILATION AND CURETTAGE OF UTERUS  1992   DILATION AND CURETTAGE OF UTERUS  1993   DILATION AND CURETTAGE OF UTERUS  2011   VEIN SURGERY     Family History   Problem Relation Age of Onset   Heart disease Father        myocardial infarction   Arthritis/Rheumatoid Father    Hypertension Father    Hyperlipidemia Mother    Breast cancer Maternal Grandmother    Colon cancer Neg Hx    Social History   Socioeconomic History   Marital status: Married    Spouse name: Not on file   Number of children: 1   Years of education: Not on file   Highest education level: Bachelor's degree (e.g., BA, AB, BS)  Occupational History   Not on file  Tobacco Use   Smoking status: Never   Smokeless tobacco: Never  Vaping Use   Vaping status: Never Used  Substance and Sexual Activity   Alcohol use: No    Alcohol/week: 0.0 standard drinks of alcohol   Drug use: No   Sexual activity: Not on file  Other Topics Concern   Not on file  Social History Narrative   Not on file   Social Determinants of Health   Financial Resource Strain: Low Risk  (10/20/2022)   Overall Financial Resource Strain (CARDIA)    Difficulty of Paying Living Expenses: Not hard at all  Food Insecurity: No Food Insecurity (10/20/2022)   Hunger Vital Sign    Worried About Running Out of Food in the Last Year: Never true    Ran Out of Food in the Last Year: Never true  Transportation Needs: No Transportation Needs (10/20/2022)   PRAPARE -  Administrator, Civil Service (Medical): No    Lack of Transportation (Non-Medical): No  Physical Activity: Sufficiently Active (10/20/2022)   Exercise Vital Sign    Days of Exercise per Week: 4 days    Minutes of Exercise per Session: 50 min  Stress: Stress Concern Present (10/20/2022)   [employer] of Occupational Health - Occupational Stress Questionnaire    Feeling of Stress : To some extent  Social Connections: Socially Integrated (10/20/2022)   Social Connection and Isolation Panel [NHANES]    Frequency of Communication with Friends and Family: More than three times a week    Frequency of Social Gatherings with Friends and Family:  Once a week    Attends Religious Services: More than 4 times per year    Active Member of [employer] or Organizations: Yes    Attends Engineer, structural: More than 4 times per year    Marital Status: Married     Review of Systems  Constitutional:  Negative for appetite change and unexpected weight change.  HENT:  Negative for congestion and sinus pressure.   Respiratory:  Negative for cough, chest tightness and shortness of breath.   Cardiovascular:  Negative for chest pain and palpitations.  Gastrointestinal:  Negative for abdominal pain, diarrhea, nausea and vomiting.  Genitourinary:  Negative for difficulty urinating and dysuria.  Musculoskeletal:        Joint pains as outlined.   Skin:  Negative for color change and rash.  Neurological:  Negative for dizziness and headaches.  Psychiatric/Behavioral:  Negative for agitation and dysphoric mood.        Objective:     BP 110/70   Pulse 74   Temp 97.9 F (36.6 C)   Resp 16   Ht 5' 4.5" (1.638 m)   Wt 158 lb (71.7 kg)   LMP 02/14/2010   SpO2 98%   BMI 26.70 kg/m  Wt Readings from Last 3 Encounters:  01/25/23 158 lb (71.7 kg)  10/24/22 165 lb (74.8 kg)  08/15/22 162 lb (73.5 kg)    Physical Exam Vitals reviewed.  Constitutional:      General: She is not in acute distress.    Appearance: Normal appearance.  HENT:     Head: Normocephalic and atraumatic.     Right Ear: External ear normal.     Left Ear: External ear normal.  Eyes:     General: No scleral icterus.       Right eye: No discharge.        Left eye: No discharge.     Conjunctiva/sclera: Conjunctivae normal.  Neck:     Thyroid: No thyromegaly.  Cardiovascular:     Rate and Rhythm: Normal rate and regular rhythm.  Pulmonary:     Effort: No respiratory distress.     Breath sounds: Normal breath sounds. No wheezing.  Abdominal:     General: Bowel sounds are normal.     Palpations: Abdomen is soft.     Tenderness: There is no abdominal  tenderness.  Musculoskeletal:        General: No swelling or tenderness.     Cervical back: Neck supple. No tenderness.     Comments: DP pulse diminished left foot.   Lymphadenopathy:     Cervical: No cervical adenopathy.  Skin:    Findings: No erythema or rash.  Neurological:     Mental Status: She is alert.  Psychiatric:        Mood and Affect: Mood normal.  Behavior: Behavior normal.      Outpatient Encounter Medications as of 01/25/2023  Medication Sig   busPIRone (BUSPAR) 5 MG tablet Take 1 tablet (5 mg total) by mouth daily.   Glucosamine-Chondroitin 1500-1200 MG/30ML LIQD    azelastine (ASTELIN) 0.1 % nasal spray PLACE 1 SPRAY INTO BOTH NOSTRILS 2 (TWO) TIMES DAILY. USE IN EACH NOSTRIL AS DIRECTED   Biotin 5000 MCG TABS    Calcium-Magnesium-Vitamin D (CALCIUM 500 PO) Take by mouth.   Cholecalciferol (VITAMIN D) 50 MCG (2000 UT) tablet Take 2,000 Units by mouth daily.   ciclopirox (LOPROX) 0.77 % cream Apply topically daily.   clobetasol cream (TEMOVATE) 0.05 % Apply to elbows twice a day as needed. Avoid face, groin, axilla.   Cranberry 400 MG CAPS Take by mouth.   doxycycline (VIBRA-TABS) 100 MG tablet Take 1 tablet (100 mg total) by mouth 2 (two) times daily.   fish oil-omega-3 fatty acids 1000 MG capsule Take 1 g by mouth daily.   gabapentin (NEURONTIN) 100 MG capsule TAKE 2 CAPSULES BY MOUTH AT BEDTIME   LORazepam (ATIVAN) 0.5 MG tablet Take 0.25 mg by mouth every 8 (eight) hours as needed for anxiety.   MISC NATURAL PRODUCTS PO Herbal Supplement   Multiple Vitamin (MULTIVITAMIN) tablet Take 1 tablet by mouth daily.   mupirocin ointment (BACTROBAN) 2 % Apply topically 3 (three) times daily. Left elbow   omeprazole (PRILOSEC) 20 MG capsule Take 20 mg by mouth 2 (two) times daily.    Probiotic Product (PROBIOTIC PO) Take by mouth.   RYALTRIS 665-25 MCG/ACT SUSP INSTILL 2 SPRAYS IN EACH NOSTRIL TWICE DAILY   sildenafil (REVATIO) 20 MG tablet Take by mouth.    tacrolimus (PROTOPIC) 0.1 % ointment APPLY TO AFFECTED AREAS ONCE TO TWICE A DAY.   Tavaborole (KERYDIN) 5 % SOLN Apply to affected toenail every night until improved.   [DISCONTINUED] nitroGLYCERIN (NITROGLYN) 2 % ointment Apply 0.5 inches topically 3 (three) times daily as needed.    No facility-administered encounter medications on file as of 01/25/2023.     Lab Results  Component Value Date   WBC 6.1 01/25/2023   HGB 13.1 01/25/2023   HCT 40.0 01/25/2023   PLT 238.0 01/25/2023   GLUCOSE 89 01/25/2023   CHOL 190 01/25/2023   TRIG 94.0 01/25/2023   HDL 59.60 01/25/2023   LDLDIRECT 113.0 10/24/2022   LDLCALC 112 (H) 01/25/2023   ALT 12 01/25/2023   AST 20 01/25/2023   NA 139 01/25/2023   K 4.2 01/25/2023   CL 103 01/25/2023   CREATININE 0.86 01/25/2023   BUN 15 01/25/2023   CO2 29 01/25/2023   TSH 1.13 01/25/2023    DG Knee 1-2 Views Left  Result Date: 06/19/2022 CLINICAL DATA:  Left knee pain EXAM: LEFT KNEE - 1-2 VIEW COMPARISON:  None Available. FINDINGS: No fracture or dislocation of the left knee. Minimal patellofemoral arthrosis. Otherwise preserved joint spaces. Small, nonspecific knee joint effusion. Soft tissues unremarkable. IMPRESSION: 1. No fracture or dislocation of the left knee. Minimal patellofemoral arthrosis. Otherwise preserved joint spaces. 2. Small, nonspecific knee joint effusion. Electronically Signed   By: Jearld Lesch M.D.   On: 06/19/2022 21:38       Assessment & Plan:  Scleroderma [facility]) Assessment & Plan: Followed by rheumatology.  Now seeing Dr Allena Katz and Dr Clelia Croft [facility]).  Has f/u with Dr Sherryll Burger tomorrow.   Orders: -     CBC with Differential/Platelet -     Basic metabolic panel -  Hepatic function panel -     TSH  Hypercholesteremia Assessment & Plan: Low cholesterol diet and exercise.  Follow lipid panel.   Orders: -     Lipid panel  Anxiety Assessment & Plan: Taking gabapentin.  Feels needs something to help with increased  anxiety.  Discussed treatment options.  Trial of buspar.  Follow.     Skin ulcer of finger, limited to breakdown of skin [facility]) Assessment & Plan: Seeing dermatology and rheumatology.  Has f/u with Dr Sherryll Burger tomorrow.    Gastroesophageal reflux disease with esophagitis without hemorrhage Assessment & Plan: No acid reflux reported.  Prilosec.    History of colonic polyps Assessment & Plan: Colonoscopy 03/2021.    SOB (shortness of breath) Assessment & Plan: Has seen Dr Juliann Pares recently for sob.  S/p echo and holter. Per note, no significant abnormality.    Absent pulse Assessment & Plan: Diminished/absent pulse - left foot.  Refer to [facility] for ABIs.   Orders: -     VAS [place] ABI WITH/WO TBI; Future  Other orders -     busPIRone HCl; Take 1 tablet (5 mg total) by mouth daily.  Dispense: 30 tablet; Refill: 1     [place], MD

## 2023-01-26 ENCOUNTER — Encounter: Payer: Self-pay | Admitting: Internal Medicine

## 2023-01-30 ENCOUNTER — Encounter: Payer: Self-pay | Admitting: Internal Medicine

## 2023-01-30 DIAGNOSIS — R0989 Other specified symptoms and signs involving the circulatory and respiratory systems: Secondary | ICD-10-CM | POA: Insufficient documentation

## 2023-01-30 DIAGNOSIS — R0602 Shortness of breath: Secondary | ICD-10-CM | POA: Insufficient documentation

## 2023-01-30 NOTE — Assessment & Plan Note (Signed)
Taking gabapentin.  Feels needs something to help with increased anxiety.  Discussed treatment options.  Trial of buspar.  Follow.

## 2023-01-30 NOTE — Assessment & Plan Note (Signed)
Has seen Dr Juliann Pares recently for sob.  S/p echo and holter. Per note, no significant abnormality.

## 2023-01-30 NOTE — Assessment & Plan Note (Signed)
Followed by rheumatology.  Now seeing Dr Allena Katz and Dr Clelia Croft [facility]).  Has f/u with Dr Sherryll Burger tomorrow.

## 2023-01-30 NOTE — Assessment & Plan Note (Signed)
Diminished/absent pulse - left foot.  Refer to [facility] for ABIs.

## 2023-01-30 NOTE — Assessment & Plan Note (Signed)
Colonoscopy 03/2021.  

## 2023-01-30 NOTE — Assessment & Plan Note (Signed)
No acid reflux reported.  Prilosec.  

## 2023-01-30 NOTE — Assessment & Plan Note (Signed)
Seeing dermatology and rheumatology.  Has f/u with Dr Sherryll Burger tomorrow.

## 2023-01-30 NOTE — Assessment & Plan Note (Signed)
 Low cholesterol diet and exercise.  Follow lipid panel.   

## 2023-02-03 ENCOUNTER — Encounter: Payer: Self-pay | Admitting: Internal Medicine

## 2023-02-03 ENCOUNTER — Ambulatory Visit
Admission: RE | Admit: 2023-02-03 | Discharge: 2023-02-03 | Disposition: A | Discharge status: Home / Self Care | Payer: BC Managed Care – PPO | Source: Ambulatory Visit | Attending: Internal Medicine | Admitting: Internal Medicine

## 2023-02-03 ENCOUNTER — Ambulatory Visit (INDEPENDENT_AMBULATORY_CARE_PROVIDER_SITE_OTHER): Payer: BC Managed Care – PPO

## 2023-02-03 DIAGNOSIS — Z1231 Encounter for screening mammogram for malignant neoplasm of breast: Secondary | ICD-10-CM | POA: Diagnosis not present

## 2023-02-03 DIAGNOSIS — R0989 Other specified symptoms and signs involving the circulatory and respiratory systems: Secondary | ICD-10-CM

## 2023-02-05 ENCOUNTER — Encounter: Payer: Self-pay | Admitting: Internal Medicine

## 2023-02-07 NOTE — Telephone Encounter (Signed)
Ok to send in rx for gabapentin 100mg  2 per day and note to hold until next fill?

## 2023-02-07 NOTE — Telephone Encounter (Signed)
Ok to send in rx for gabapentin 100mg  - 2 per day. Please clarify if she is taking one bid or 2 q day.  Thanks.

## 2023-02-08 LAB — VAS US ABI WITH/WO TBI
Left ABI: 1.14
Right ABI: 1.02

## 2023-02-09 MED ORDER — GABAPENTIN 100 MG PO CAPS: ORAL_CAPSULE | ORAL | 1 refills | Status: DC

## 2023-02-15 ENCOUNTER — Encounter (INDEPENDENT_AMBULATORY_CARE_PROVIDER_SITE_OTHER): Payer: Self-pay

## 2023-02-16 ENCOUNTER — Other Ambulatory Visit: Payer: Self-pay | Admitting: Internal Medicine

## 2023-03-17 ENCOUNTER — Ambulatory Visit: Payer: BC Managed Care – PPO | Admitting: Internal Medicine

## 2023-03-17 ENCOUNTER — Encounter: Payer: Self-pay | Admitting: Internal Medicine

## 2023-03-17 VITALS — BP 114/72 | HR 77 | Temp 97.7°F | Resp 16 | Ht 64.5 in | Wt 153.8 lb

## 2023-03-17 DIAGNOSIS — R0989 Other specified symptoms and signs involving the circulatory and respiratory systems: Secondary | ICD-10-CM | POA: Diagnosis not present

## 2023-03-17 DIAGNOSIS — M349 Systemic sclerosis, unspecified: Secondary | ICD-10-CM

## 2023-03-17 DIAGNOSIS — L98491 Non-pressure chronic ulcer of skin of other sites limited to breakdown of skin: Secondary | ICD-10-CM | POA: Diagnosis not present

## 2023-03-17 DIAGNOSIS — K2100 Gastro-esophageal reflux disease with esophagitis, without bleeding: Secondary | ICD-10-CM

## 2023-03-17 DIAGNOSIS — F419 Anxiety disorder, unspecified: Secondary | ICD-10-CM

## 2023-03-17 DIAGNOSIS — M25512 Pain in left shoulder: Secondary | ICD-10-CM

## 2023-03-17 DIAGNOSIS — Z86010 Personal history of colonic polyps: Secondary | ICD-10-CM

## 2023-03-17 DIAGNOSIS — E7800 Pure hypercholesterolemia, unspecified: Secondary | ICD-10-CM

## 2023-03-17 MED ORDER — DOXYCYCLINE HYCLATE 100 MG PO TABS: 100.0000 mg | ORAL_TABLET | Freq: Two times a day (BID) | ORAL | 0 refills | Status: DC

## 2023-03-17 NOTE — Progress Notes (Unsigned)
Subjective:    Patient ID: Denise Arnold, female    DOB: 1961/05/16, 62 y.o.   MRN: [id number]  Patient here for  Chief Complaint  Patient presents with   Medical Management of Chronic Issues    HPI Here for a scheduled follow up. Here to follow up regarding scleroderma and increased stress/anxiety.  Has been having more issues recently with finger tip ulcerations.  Using compounded medication from dermatology.  Has to take doxycycline intermittently - to help control.  Taking 1/2 sildenafil bid.  Unable to tolerate higher dose.  No chest pain reported.  Breathing stable.  Increased pain - left shoulder/upper arm.  Neck is better.  Persistent pain - request further evaluation.  No abdominal pain or bowel change reported.  Increased stress/anxiety related to above.  On buspar.  Does not feel helping. Discussed increasing dose.  Has 5mg  tablets. Will double up and call with update.  Hold on sending in rx until can see response.     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HCC)    Scleroderma (HCC)    positive FANA, positive SCL-70 abs, raynauds, mild pulmonary hypertension, normal DLCO   Tricuspid regurgitation    Vitamin D deficiency    Past Surgical History:  Procedure Laterality Date   CESAREAN SECTION  1994   COLONOSCOPY WITH PROPOFOL N/A 05/29/2015   Procedure: COLONOSCOPY WITH PROPOFOL;  Surgeon: Scot Jun, MD;  Location: [facility] ENDOSCOPY;  Service: Endoscopy;  Laterality: N/A;   DILATION AND CURETTAGE OF UTERUS  1992   DILATION AND CURETTAGE OF UTERUS  1993   DILATION AND CURETTAGE OF UTERUS  2011   VEIN SURGERY     Family History  Problem Relation Age of Onset   Heart disease Father        myocardial infarction   Arthritis/Rheumatoid Father    Hypertension Father    Hyperlipidemia Mother    Breast cancer Maternal Grandmother     Colon cancer Neg Hx    Social History   Socioeconomic History   Marital status: Married    Spouse name: Not on file   Number of children: 1   Years of education: Not on file   Highest education level: Bachelor's degree (e.g., BA, AB, BS)  Occupational History   Not on file  Tobacco Use   Smoking status: Never   Smokeless tobacco: Never  Vaping Use   Vaping status: Never Used  Substance and Sexual Activity   Alcohol use: No    Alcohol/week: 0.0 standard drinks of alcohol   Drug use: No   Sexual activity: Not on file  Other Topics Concern   Not on file  Social History Narrative   Not on file   Social Determinants of Health   Financial Resource Strain: Low Risk  (10/20/2022)   Overall Financial Resource Strain (CARDIA)    Difficulty of Paying Living Expenses: Not hard at all  Food Insecurity: No Food Insecurity (10/20/2022)   Hunger Vital Sign    Worried About Running Out of Food in the Last Year: Never true    Ran Out of Food in the Last Year: Never true  Transportation Needs: No Transportation Needs (10/20/2022)   PRAPARE - Administrator, Civil Service (Medical): No    Lack of Transportation (Non-Medical): No  Physical Activity: Sufficiently  Active (10/20/2022)   Exercise Vital Sign    Days of Exercise per Week: 4 days    Minutes of Exercise per Session: 50 min  Stress: Stress Concern Present (10/20/2022)   [employer] of Occupational Health - Occupational Stress Questionnaire    Feeling of Stress : To some extent  Social Connections: Socially Integrated (10/20/2022)   Social Connection and Isolation Panel [NHANES]    Frequency of Communication with Friends and Family: More than three times a week    Frequency of Social Gatherings with Friends and Family: Once a week    Attends Religious Services: More than 4 times per year    Active Member of [employer] or Organizations: Yes    Attends Engineer, structural: More than 4 times per year    Marital Status:  Married     Review of Systems  Constitutional:  Negative for appetite change and unexpected weight change.  HENT:  Negative for congestion and sinus pressure.   Respiratory:  Negative for cough, chest tightness and shortness of breath.   Cardiovascular:  Negative for chest pain, palpitations and leg swelling.  Gastrointestinal:  Negative for abdominal pain, diarrhea, nausea and vomiting.  Genitourinary:  Negative for difficulty urinating and dysuria.  Musculoskeletal:  Negative for joint swelling and myalgias.  Skin:  Negative for rash.       Erythema - finger tips.    Neurological:  Negative for dizziness and headaches.  Psychiatric/Behavioral:  Negative for agitation and dysphoric mood.        Objective:     BP 114/72   Pulse 77   Temp 97.7 F (36.5 C) (Oral)   Resp 16   Ht 5' 4.5" (1.638 m)   Wt 153 lb 12.8 oz (69.8 kg)   LMP 02/14/2010   SpO2 96%   BMI 25.99 kg/m  Wt Readings from Last 3 Encounters:  03/17/23 153 lb 12.8 oz (69.8 kg)  01/25/23 158 lb (71.7 kg)  10/24/22 165 lb (74.8 kg)    Physical Exam Vitals reviewed.  Constitutional:      General: She is not in acute distress.    Appearance: Normal appearance.  HENT:     Head: Normocephalic and atraumatic.     Right Ear: External ear normal.     Left Ear: External ear normal.  Eyes:     General: No scleral icterus.       Right eye: No discharge.        Left eye: No discharge.     Conjunctiva/sclera: Conjunctivae normal.  Neck:     Thyroid: No thyromegaly.  Cardiovascular:     Rate and Rhythm: Normal rate and regular rhythm.  Pulmonary:     Effort: No respiratory distress.     Breath sounds: Normal breath sounds. No wheezing.  Abdominal:     General: Bowel sounds are normal.     Palpations: Abdomen is soft.     Tenderness: There is no abdominal tenderness.  Musculoskeletal:        General: No swelling or tenderness.     Cervical back: Neck supple. No tenderness.  Lymphadenopathy:     Cervical:  No cervical adenopathy.  Skin:    Findings: No erythema or rash.  Neurological:     Mental Status: She is alert.  Psychiatric:        Mood and Affect: Mood normal.        Behavior: Behavior normal.      Outpatient Encounter Medications as of 03/17/2023  Medication Sig   azelastine (ASTELIN) 0.1 % nasal spray PLACE 1 SPRAY INTO BOTH NOSTRILS 2 (TWO) TIMES DAILY. USE IN EACH NOSTRIL AS DIRECTED   Biotin 5000 MCG TABS    busPIRone (BUSPAR) 5 MG tablet TAKE 1 TABLET (5 MG TOTAL) BY MOUTH DAILY.   Calcium-Magnesium-Vitamin D (CALCIUM 500 PO) Take by mouth.   Cholecalciferol (VITAMIN D) 50 MCG (2000 UT) tablet Take 2,000 Units by mouth daily.   ciclopirox (LOPROX) 0.77 % cream Apply topically daily.   clobetasol cream (TEMOVATE) 0.05 % Apply to elbows twice a day as needed. Avoid face, groin, axilla.   Cranberry 400 MG CAPS Take by mouth.   doxycycline (VIBRA-TABS) 100 MG tablet Take 1 tablet (100 mg total) by mouth 2 (two) times daily.   fish oil-omega-3 fatty acids 1000 MG capsule Take 1 g by mouth daily.   gabapentin (NEURONTIN) 100 MG capsule Take 2 capsules by mouth at bedtime   Glucosamine-Chondroitin 1500-1200 MG/30ML LIQD    LORazepam (ATIVAN) 0.5 MG tablet Take 0.25 mg by mouth every 8 (eight) hours as needed for anxiety.   MISC NATURAL PRODUCTS PO Herbal Supplement   Multiple Vitamin (MULTIVITAMIN) tablet Take 1 tablet by mouth daily.   mupirocin ointment (BACTROBAN) 2 % Apply topically 3 (three) times daily. Left elbow   omeprazole (PRILOSEC) 20 MG capsule Take 20 mg by mouth 2 (two) times daily.    Probiotic Product (PROBIOTIC PO) Take by mouth.   RYALTRIS 665-25 MCG/ACT SUSP INSTILL 2 SPRAYS IN EACH NOSTRIL TWICE DAILY   sildenafil (REVATIO) 20 MG tablet Take by mouth.   tacrolimus (PROTOPIC) 0.1 % ointment APPLY TO AFFECTED AREAS ONCE TO TWICE A DAY.   Tavaborole (KERYDIN) 5 % SOLN Apply to affected toenail every night until improved.   [DISCONTINUED] doxycycline  (VIBRA-TABS) 100 MG tablet Take 1 tablet (100 mg total) by mouth 2 (two) times daily.   No facility-administered encounter medications on file as of 03/17/2023.     Lab Results  Component Value Date   WBC 6.1 01/25/2023   HGB 13.1 01/25/2023   HCT 40.0 01/25/2023   PLT 238.0 01/25/2023   GLUCOSE 89 01/25/2023   CHOL 190 01/25/2023   TRIG 94.0 01/25/2023   HDL 59.60 01/25/2023   LDLDIRECT 113.0 10/24/2022   LDLCALC 112 (H) 01/25/2023   ALT 12 01/25/2023   AST 20 01/25/2023   NA 139 01/25/2023   K 4.2 01/25/2023   CL 103 01/25/2023   CREATININE 0.86 01/25/2023   BUN 15 01/25/2023   CO2 29 01/25/2023   TSH 1.13 01/25/2023    VAS [place] ABI WITH/WO TBI  Result Date: 02/08/2023  LOWER EXTREMITY DOPPLER STUDY Patient Name:  Lacee Schwantz Kamer  Date of Exam:   02/03/2023 Medical Rec #: [id number]     Accession #:    [id number] Date of Birth: November 04, 1960     Patient Gender: F Patient Age:   23 years Exam Location:  [facility] Procedure:      VAS [place] ABI WITH/WO TBI Referring Phys: Rebekka Lobello --------------------------------------------------------------------------------  Indications: Claudication. High Risk Factors: No history of smoking. Other Factors: Raynaud's phenomenon with lower digit discoloration bilaterally.                Absent pulses.  Performing Technologist: Hardie Lora RVT  Examination Guidelines: A complete evaluation includes at minimum, Doppler waveform signals and systolic blood pressure reading at the level of bilateral brachial, anterior tibial, and posterior tibial arteries, when vessel segments  are accessible. Bilateral testing is considered an integral part of a complete examination. Photoelectric Plethysmograph (PPG) waveforms and toe systolic pressure readings are included as required and additional duplex testing as needed. Limited examinations for reoccurring indications may be performed as noted.  ABI Findings:  +---------+------------------+-----+----------+--------+ Right    Rt Pressure (mmHg)IndexWaveform  Comment  +---------+------------------+-----+----------+--------+ Brachial 143                                       +---------+------------------+-----+----------+--------+ PTA      163               1.14 triphasic          +---------+------------------+-----+----------+--------+ DP       146               1.02 monophasic         +---------+------------------+-----+----------+--------+ Great Toe99                0.69                    +---------+------------------+-----+----------+--------+ +---------+------------------+-----+----------+-------+ Left     Lt Pressure (mmHg)IndexWaveform  Comment +---------+------------------+-----+----------+-------+ Brachial 139                                      +---------+------------------+-----+----------+-------+ PTA      107               0.75 triphasic         +---------+------------------+-----+----------+-------+ DP       163               1.14 monophasic        +---------+------------------+-----+----------+-------+ Great Toe79                0.55                   +---------+------------------+-----+----------+-------+ +-------+-----------+-----------+------------+------------+ ABI/TBIToday's ABIToday's TBIPrevious ABIPrevious TBI +-------+-----------+-----------+------------+------------+ Right  1.02       0.69                                +-------+-----------+-----------+------------+------------+ Left   1.14       0.55                                +-------+-----------+-----------+------------+------------+  Summary: Right: Resting right ankle-brachial index is within normal range. The right toe-brachial index is abnormal. Left: Resting left ankle-brachial index is within normal range. The left toe-brachial index is abnormal. *See table(s) above for measurements and observations.   Electronically signed by Levora Dredge MD on 02/08/2023 at 4:27:04 PM.    Final    MM 3D SCREENING MAMMOGRAM BILATERAL BREAST  Result Date: 02/06/2023 CLINICAL DATA:  Screening. EXAM: DIGITAL SCREENING BILATERAL MAMMOGRAM WITH TOMOSYNTHESIS AND CAD TECHNIQUE: Bilateral screening digital craniocaudal and mediolateral oblique mammograms were obtained. Bilateral screening digital breast tomosynthesis was performed. The images were evaluated with computer-aided detection. COMPARISON:  Previous exam(s). ACR Breast Density Category c: The breasts are heterogeneously dense, which may obscure small masses. FINDINGS: There are no findings suspicious for malignancy. IMPRESSION: No mammographic evidence of malignancy. A result letter of this screening mammogram will be mailed directly to  the patient. RECOMMENDATION: Screening mammogram in one year. (Code:SM-B-01Y) BI-RADS CATEGORY  1: Negative. Electronically Signed   By: Annia Belt M.D.   On: 02/06/2023 12:40       Assessment & Plan:  Absent pulse Assessment & Plan: Diminished/absent pulse noted on previous exam - left foot.  Refer to [facility] for ABIs.  ABI - toe - brachial index - left and right - abnormal.  D/w [facility].  Refer to vascular for further evaluation.    Orders: -     Ambulatory referral to Vascular Surgery  Anxiety Assessment & Plan: Taking gabapentin. Started buspar last visit.  On 5mg  q day now.  Will increase to 10mg .  Hold on changing rx. She has enough 5mg  tablets to double up.  Will call with update.  Follow.       Skin ulcer of finger, limited to breakdown of skin [facility]) Assessment & Plan: Seeing dermatology and rheumatology.  Has f/u with Dr Sherryll Burger scheduled in 06/2023.  Using compounded cream now.  Persistent issues as outlined.  Treat with doxycycline.  Follow.     Scleroderma (HCC) Assessment & Plan: Followed by rheumatology.  Now seeing Dr Allena Katz and Dr Clelia Croft [facility]).  Treat finger ulcerations as outlined.     Hypercholesteremia Assessment & Plan: Low cholesterol diet and exercise.  Follow lipid panel.    History of colonic polyps Assessment & Plan: Colonoscopy 03/2021.    Gastroesophageal reflux disease with esophagitis without hemorrhage Assessment & Plan: No acid reflux reported.  Prilosec.    Left shoulder pain, unspecified chronicity Assessment & Plan: Persistent left shoulder pain as outlined.  Request referral to ortho.    Orders: -     Ambulatory referral to Orthopedic Surgery  Other orders -     Doxycycline Hyclate; Take 1 tablet (100 mg total) by mouth 2 (two) times daily.  Dispense: 14 tablet; Refill: 0     Dale [place], MD

## 2023-03-17 NOTE — Patient Instructions (Signed)
Increase buspar to 10mg  in the am.

## 2023-03-19 ENCOUNTER — Encounter: Payer: Self-pay | Admitting: Internal Medicine

## 2023-03-19 DIAGNOSIS — M25512 Pain in left shoulder: Secondary | ICD-10-CM | POA: Insufficient documentation

## 2023-03-19 NOTE — Assessment & Plan Note (Signed)
Seeing dermatology and rheumatology.  Has f/u with Dr Sherryll Burger scheduled in 06/2023.  Using compounded cream now.  Persistent issues as outlined.  Treat with doxycycline.  Follow.

## 2023-03-19 NOTE — Assessment & Plan Note (Signed)
Taking gabapentin. Started buspar last visit.  On 5mg  q day now.  Will increase to 10mg .  Hold on changing rx. She has enough 5mg  tablets to double up.  Will call with update.  Follow.

## 2023-03-19 NOTE — Assessment & Plan Note (Signed)
Low cholesterol diet and exercise.  Follow lipid panel.   

## 2023-03-19 NOTE — Assessment & Plan Note (Signed)
Followed by rheumatology.  Now seeing Dr Allena Katz and Dr Clelia Croft [facility]).  Treat finger ulcerations as outlined.

## 2023-03-19 NOTE — Assessment & Plan Note (Signed)
Colonoscopy 03/2021.  

## 2023-03-19 NOTE — Assessment & Plan Note (Signed)
Persistent left shoulder pain as outlined.  Request referral to ortho.

## 2023-03-19 NOTE — Assessment & Plan Note (Signed)
No acid reflux reported.  Prilosec.  

## 2023-03-19 NOTE — Assessment & Plan Note (Signed)
Diminished/absent pulse noted on previous exam - left foot.  Refer to [facility] for ABIs.  ABI - toe - brachial index - left and right - abnormal.  D/w [facility].  Refer to vascular for further evaluation.

## 2023-03-22 ENCOUNTER — Encounter: Payer: Self-pay | Admitting: Internal Medicine

## 2023-03-22 NOTE — Telephone Encounter (Signed)
She is currently taking 5mg  (2 per day).  Just increased dose at last appt.  I am ok to send in refill with buspar 7.5mg  tablet one q day.  Keep [place] posted on how she is doing.  Also, regarding the referral to vascular, the referral is in, can see if Rasheedah can help.

## 2023-03-23 ENCOUNTER — Other Ambulatory Visit: Payer: Self-pay

## 2023-03-23 MED ORDER — BUSPIRONE HCL 7.5 MG PO TABS: 7.5000 mg | ORAL_TABLET | Freq: Every day | ORAL | 1 refills | Status: DC

## 2023-03-23 NOTE — Telephone Encounter (Signed)
Can you follow up on her referral to [facility]? Thanks!

## 2023-03-26 ENCOUNTER — Encounter: Payer: Self-pay | Admitting: Internal Medicine

## 2023-03-27 ENCOUNTER — Telehealth: Payer: BC Managed Care – PPO | Admitting: Physician Assistant

## 2023-03-27 DIAGNOSIS — R3989 Other symptoms and signs involving the genitourinary system: Secondary | ICD-10-CM

## 2023-03-27 MED ORDER — NITROFURANTOIN MONOHYD MACRO 100 MG PO CAPS
100.0000 mg | ORAL_CAPSULE | Freq: Two times a day (BID) | ORAL | 0 refills | Status: DC
Start: 2023-03-27 — End: 2023-04-10

## 2023-03-27 MED ORDER — CEPHALEXIN 500 MG PO CAPS
500.0000 mg | ORAL_CAPSULE | Freq: Two times a day (BID) | ORAL | 0 refills | Status: DC
Start: 2023-03-27 — End: 2023-03-27

## 2023-03-27 NOTE — Addendum Note (Signed)
Addended by: Margaretann Loveless on: 03/27/2023 09:32 AM   Modules accepted: Orders

## 2023-03-27 NOTE — Progress Notes (Signed)
E-Visit for Urinary Problems  We are sorry that you are not feeling well.  Here is how we plan to help!  Based on what you shared with me it looks like you most likely have a simple urinary tract infection.  A UTI (Urinary Tract Infection) is a bacterial infection of the bladder.  Most cases of urinary tract infections are simple to treat but a key part of your care is to encourage you to drink plenty of fluids and watch your symptoms carefully.  I have prescribed Keflex 500 mg twice a day for 7 days.  Your symptoms should gradually improve. Call us if the burning in your urine worsens, you develop worsening fever, back pain or pelvic pain or if your symptoms do not resolve after completing the antibiotic.  Urinary tract infections can be prevented by drinking plenty of water to keep your body hydrated.  Also be sure when you wipe, wipe from front to back and don't hold it in!  If possible, empty your bladder every 4 hours.  HOME CARE Drink plenty of fluids Compete the full course of the antibiotics even if the symptoms resolve Remember, when you need to go.go. Holding in your urine can increase the likelihood of getting a UTI! GET HELP RIGHT AWAY IF: You cannot urinate You get a high fever Worsening back pain occurs You see blood in your urine You feel sick to your stomach or throw up You feel like you are going to pass out  MAKE SURE YOU  Understand these instructions. Will watch your condition. Will get help right away if you are not doing well or get worse.   Thank you for choosing an e-visit.  Your e-visit answers were reviewed by a board certified advanced clinical practitioner to complete your personal care plan. Depending upon the condition, your plan could have included both over the counter or prescription medications.  Please review your pharmacy choice. Make sure the pharmacy is open so you can pick up prescription now. If there is a problem, you may contact your  provider through MyChart messaging and have the prescription routed to another pharmacy.  Your safety is important to us. If you have drug allergies check your prescription carefully.   For the next 24 hours you can use MyChart to ask questions about today's visit, request a non-urgent call back, or ask for a work or school excuse. You will get an email in the next two days asking about your experience. I hope that your e-visit has been valuable and will speed your recovery.  I have spent 5 minutes in review of e-visit questionnaire, review and updating patient chart, medical decision making and response to patient.   Jennifer M Burnette, PA-C  

## 2023-03-27 NOTE — Telephone Encounter (Signed)
Called patient to offer her an appt with Dr Scott/time to bring in urine sample. Patient has a class from 10-11 and another appt at 2. She did not want to wait until tomorrow so she is going to do an e-visit/virtual urgent care.

## 2023-03-29 MED ORDER — FLUCONAZOLE 150 MG PO TABS: ORAL_TABLET | ORAL | 0 refills | Status: DC

## 2023-03-29 NOTE — Addendum Note (Signed)
Addended by: Waldon Merl on: 03/29/2023 10:57 AM   Modules accepted: Orders

## 2023-04-10 ENCOUNTER — Encounter: Payer: Self-pay | Admitting: Internal Medicine

## 2023-04-10 ENCOUNTER — Ambulatory Visit (INDEPENDENT_AMBULATORY_CARE_PROVIDER_SITE_OTHER): Payer: BC Managed Care – PPO | Admitting: Internal Medicine

## 2023-04-10 VITALS — BP 130/72 | HR 70 | Temp 98.0°F | Ht 64.0 in | Wt 157.0 lb

## 2023-04-10 DIAGNOSIS — M349 Systemic sclerosis, unspecified: Secondary | ICD-10-CM | POA: Diagnosis not present

## 2023-04-10 DIAGNOSIS — R0981 Nasal congestion: Secondary | ICD-10-CM

## 2023-04-10 DIAGNOSIS — R0989 Other specified symptoms and signs involving the circulatory and respiratory systems: Secondary | ICD-10-CM

## 2023-04-10 DIAGNOSIS — F419 Anxiety disorder, unspecified: Secondary | ICD-10-CM

## 2023-04-10 DIAGNOSIS — L98491 Non-pressure chronic ulcer of skin of other sites limited to breakdown of skin: Secondary | ICD-10-CM

## 2023-04-10 DIAGNOSIS — J3489 Other specified disorders of nose and nasal sinuses: Secondary | ICD-10-CM

## 2023-04-10 DIAGNOSIS — R3915 Urgency of urination: Secondary | ICD-10-CM | POA: Diagnosis not present

## 2023-04-10 DIAGNOSIS — M25512 Pain in left shoulder: Secondary | ICD-10-CM

## 2023-04-10 DIAGNOSIS — E7800 Pure hypercholesterolemia, unspecified: Secondary | ICD-10-CM

## 2023-04-10 DIAGNOSIS — R252 Cramp and spasm: Secondary | ICD-10-CM

## 2023-04-10 DIAGNOSIS — K2100 Gastro-esophageal reflux disease with esophagitis, without bleeding: Secondary | ICD-10-CM

## 2023-04-10 LAB — POC COVID19 BINAXNOW: SARS Coronavirus 2 Ag: NEGATIVE

## 2023-04-11 LAB — URINALYSIS, ROUTINE W REFLEX MICROSCOPIC
Bilirubin Urine: NEGATIVE
Hgb urine dipstick: NEGATIVE
Ketones, ur: NEGATIVE
Leukocytes,Ua: NEGATIVE
Nitrite: NEGATIVE
RBC / HPF: NONE SEEN (ref 0–?)
Specific Gravity, Urine: <=1.005 — AB (ref 1.000–1.030)
Total Protein, Urine: NEGATIVE
Urine Glucose: NEGATIVE
Urobilinogen, UA: 0.2 (ref 0.0–1.0)
pH: 6 (ref 5.0–8.0)

## 2023-04-11 LAB — URINE CULTURE
MICRO NUMBER:: 15502300
Result:: NO GROWTH
SPECIMEN QUALITY:: ADEQUATE

## 2023-04-14 ENCOUNTER — Encounter (INDEPENDENT_AMBULATORY_CARE_PROVIDER_SITE_OTHER): Payer: BC Managed Care – PPO | Admitting: Vascular Surgery

## 2023-04-14 ENCOUNTER — Encounter: Payer: Self-pay | Admitting: Internal Medicine

## 2023-04-14 DIAGNOSIS — R252 Cramp and spasm: Secondary | ICD-10-CM | POA: Insufficient documentation

## 2023-04-14 NOTE — Assessment & Plan Note (Signed)
Diminished/absent pulse noted on previous exam - left foot.  Refer to [facility] for ABIs.  ABI - toe - brachial index - left and right - abnormal.  D/w [facility].  Referred to vascular for further evaluation.

## 2023-04-14 NOTE — Assessment & Plan Note (Signed)
Low cholesterol diet and exercise.  Follow lipid panel.   

## 2023-04-14 NOTE — Assessment & Plan Note (Signed)
Seeing dermatology and rheumatology.  Has f/u with Dr Sherryll Burger scheduled in 06/2023.  Has compounded cream.  Just treated with doxycycline.  Ulcers - clear.  Follow.

## 2023-04-14 NOTE — Assessment & Plan Note (Signed)
Recently treated for UTI. Symptoms improved.  Has noticed a "twinge" recently.  Wants urine checked to confirm no infection.

## 2023-04-14 NOTE — Assessment & Plan Note (Signed)
Increased cramps - legs.  Intermittent.  May occur one time per week.  Discussed staying hydrated and stretches.  Avoid high heel shoes.

## 2023-04-14 NOTE — Assessment & Plan Note (Signed)
No acid reflux reported.  Prilosec.  

## 2023-04-14 NOTE — Assessment & Plan Note (Signed)
Followed by rheumatology.  Seeing Dr Allena Katz and Dr Clelia Croft [facility]).  Fingers improved (no ulcerations).  Follow.

## 2023-04-14 NOTE — Assessment & Plan Note (Signed)
On buspar.  Currently taking 7.5mg  in am and 5mg  in pm.  Doing better on this dose.  Discussed 7.5mg  bid. Call with update.

## 2023-04-14 NOTE — Assessment & Plan Note (Addendum)
Increased sinus pressure and congestion and drainage. Lungs clear.  Covid negative. Saline nasal spray/flonase as directed. Guaifenesin. Follow.  Call with update.

## 2023-04-14 NOTE — Assessment & Plan Note (Signed)
Saw ortho.  S/p injection.  Better.  

## 2023-04-15 ENCOUNTER — Other Ambulatory Visit: Payer: Self-pay | Admitting: Internal Medicine

## 2023-04-16 ENCOUNTER — Other Ambulatory Visit: Payer: Self-pay | Admitting: Internal Medicine

## 2023-04-17 NOTE — Telephone Encounter (Signed)
Pt has follow up on 05/08/2023, but pharmacy is requesting a 90 day refill

## 2023-04-17 NOTE — Telephone Encounter (Signed)
Per last office visit, we discussed taking buspar 7.5mg  bid.  I sent in rx for this dose. Please confirm this is the dose she is taking.  If not, will need to correct rx.

## 2023-04-18 NOTE — Addendum Note (Signed)
Addended by: Sandy Salaam on: 04/18/2023 04:10 PM   Modules accepted: Orders

## 2023-04-28 ENCOUNTER — Encounter (INDEPENDENT_AMBULATORY_CARE_PROVIDER_SITE_OTHER): Payer: BC Managed Care – PPO | Admitting: Nurse Practitioner

## 2023-05-08 ENCOUNTER — Ambulatory Visit: Payer: BC Managed Care – PPO | Admitting: Internal Medicine

## 2023-05-08 ENCOUNTER — Ambulatory Visit (INDEPENDENT_AMBULATORY_CARE_PROVIDER_SITE_OTHER): Payer: BC Managed Care – PPO | Admitting: Internal Medicine

## 2023-05-08 VITALS — BP 116/70 | HR 74 | Temp 97.8°F | Resp 16 | Ht 64.5 in | Wt 158.0 lb

## 2023-05-08 DIAGNOSIS — L98491 Non-pressure chronic ulcer of skin of other sites limited to breakdown of skin: Secondary | ICD-10-CM

## 2023-05-08 DIAGNOSIS — K2100 Gastro-esophageal reflux disease with esophagitis, without bleeding: Secondary | ICD-10-CM | POA: Diagnosis not present

## 2023-05-08 DIAGNOSIS — M349 Systemic sclerosis, unspecified: Secondary | ICD-10-CM | POA: Diagnosis not present

## 2023-05-08 DIAGNOSIS — E7800 Pure hypercholesterolemia, unspecified: Secondary | ICD-10-CM

## 2023-05-08 DIAGNOSIS — Z860100 Personal history of colon polyps, unspecified: Secondary | ICD-10-CM | POA: Diagnosis not present

## 2023-05-08 DIAGNOSIS — F419 Anxiety disorder, unspecified: Secondary | ICD-10-CM

## 2023-05-08 NOTE — Progress Notes (Signed)
Subjective:    Patient ID: Denise Arnold, female    DOB: 19-Dec-1960, 62 y.o.   MRN: [id number]  Patient here for  Chief Complaint  Patient presents with   Medical Management of Chronic Issues    HPI Here for a scheduled follow up. Here to follow up regarding increased stress, anxiety, scleroderma. She has been on buspar to help with increased anxiety.  Currently taking 7.5mg  in am and 5mg  in pm.  Plans to increase to 7.5mg  bid. Just saw pulmonary 04/26/23 - stable.  Recommended - continue PPI.  ECHO.  ECHO 04/27/23 - EF 56%, mild MR with trivial PR and mildTR.  No chest pain reported.  Breathing stable.  Planning to f/u with [facility] - 05/17/23 - f/u ABI results.     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HCC)    Scleroderma (HCC)    positive FANA, positive SCL-70 abs, raynauds, mild pulmonary hypertension, normal DLCO   Tricuspid regurgitation    Vitamin D deficiency    Past Surgical History:  Procedure Laterality Date   CESAREAN SECTION  1994   COLONOSCOPY WITH PROPOFOL N/A 05/29/2015   Procedure: COLONOSCOPY WITH PROPOFOL;  Surgeon: Scot Jun, MD;  Location: [facility] ENDOSCOPY;  Service: Endoscopy;  Laterality: N/A;   DILATION AND CURETTAGE OF UTERUS  1992   DILATION AND CURETTAGE OF UTERUS  1993   DILATION AND CURETTAGE OF UTERUS  2011   VEIN SURGERY     Family History  Problem Relation Age of Onset   Heart disease Father        myocardial infarction   Arthritis/Rheumatoid Father    Hypertension Father    Hyperlipidemia Mother    Breast cancer Maternal Grandmother    Colon cancer Neg Hx    Social History   Socioeconomic History   Marital status: Married    Spouse name: Not on file   Number of children: 1   Years of education: Not on file   Highest education level: Bachelor's degree (e.g., BA, AB, BS)  Occupational History   Not  on file  Tobacco Use   Smoking status: Never   Smokeless tobacco: Never  Vaping Use   Vaping status: Never Used  Substance and Sexual Activity   Alcohol use: No    Alcohol/week: 0.0 standard drinks of alcohol   Drug use: No   Sexual activity: Not on file  Other Topics Concern   Not on file  Social History Narrative   Not on file   Social Determinants of Health   Financial Resource Strain: Low Risk  (05/08/2023)   Overall Financial Resource Strain (CARDIA)    Difficulty of Paying Living Expenses: Not hard at all  Food Insecurity: No Food Insecurity (05/08/2023)   Hunger Vital Sign    Worried About Running Out of Food in the Last Year: Never true    Ran Out of Food in the Last Year: Never true  Transportation Needs: No Transportation Needs (05/08/2023)   PRAPARE - Administrator, Civil Service (Medical): No    Lack of Transportation (Non-Medical): No  Physical Activity: Sufficiently Active (05/08/2023)   Exercise Vital Sign    Days of Exercise per Week: 4 days    Minutes of Exercise per Session: 50 min  Stress: Stress Concern Present (05/08/2023)   [employer]  of Occupational Health - Occupational Stress Questionnaire    Feeling of Stress : To some extent  Social Connections: Socially Integrated (05/08/2023)   Social Connection and Isolation Panel [NHANES]    Frequency of Communication with Friends and Family: More than three times a week    Frequency of Social Gatherings with Friends and Family: Once a week    Attends Religious Services: More than 4 times per year    Active Member of [employer] or Organizations: Yes    Attends Engineer, structural: More than 4 times per year    Marital Status: Married     Review of Systems  Constitutional:  Negative for appetite change and unexpected weight change.  HENT:  Negative for congestion and sinus pressure.   Respiratory:  Negative for cough, chest tightness and shortness of breath.   Cardiovascular:   Negative for chest pain and palpitations.  Gastrointestinal:  Negative for abdominal pain, diarrhea, nausea and vomiting.  Genitourinary:  Negative for difficulty urinating and dysuria.  Musculoskeletal:  Negative for joint swelling and myalgias.  Skin:  Negative for color change and rash.  Neurological:  Negative for dizziness and headaches.  Psychiatric/Behavioral:  Negative for agitation and dysphoric mood.        Objective:     BP 116/70   Pulse 74   Temp 97.8 F (36.6 C)   Resp 16   Ht 5' 4.5" (1.638 m)   Wt 158 lb (71.7 kg)   LMP 02/14/2010   SpO2 97%   BMI 26.70 kg/m  Wt Readings from Last 3 Encounters:  05/08/23 158 lb (71.7 kg)  04/10/23 157 lb (71.2 kg)  03/17/23 153 lb 12.8 oz (69.8 kg)    Physical Exam Vitals reviewed.  Constitutional:      General: She is not in acute distress.    Appearance: Normal appearance.  HENT:     Head: Normocephalic and atraumatic.     Right Ear: External ear normal.     Left Ear: External ear normal.  Eyes:     General: No scleral icterus.       Right eye: No discharge.        Left eye: No discharge.     Conjunctiva/sclera: Conjunctivae normal.  Neck:     Thyroid: No thyromegaly.  Cardiovascular:     Rate and Rhythm: Normal rate and regular rhythm.  Pulmonary:     Effort: No respiratory distress.     Breath sounds: Normal breath sounds. No wheezing.  Abdominal:     General: Bowel sounds are normal.     Palpations: Abdomen is soft.     Tenderness: There is no abdominal tenderness.  Musculoskeletal:        General: No swelling or tenderness.     Cervical back: Neck supple. No tenderness.  Lymphadenopathy:     Cervical: No cervical adenopathy.  Skin:    Findings: No erythema or rash.  Neurological:     Mental Status: She is alert.  Psychiatric:        Mood and Affect: Mood normal.        Behavior: Behavior normal.      Outpatient Encounter Medications as of 05/08/2023  Medication Sig   azelastine (ASTELIN)  0.1 % nasal spray PLACE 1 SPRAY INTO BOTH NOSTRILS 2 (TWO) TIMES DAILY. USE IN EACH NOSTRIL AS DIRECTED   Biotin 5000 MCG TABS    busPIRone (BUSPAR) 7.5 MG tablet Take 1 tablet (7.5 mg total) by mouth 2 (two)  times daily.   Calcium-Magnesium-Vitamin D (CALCIUM 500 PO) Take by mouth.   Cholecalciferol (VITAMIN D) 50 MCG (2000 UT) tablet Take 2,000 Units by mouth daily.   ciclopirox (LOPROX) 0.77 % cream Apply topically daily.   clobetasol cream (TEMOVATE) 0.05 % Apply to elbows twice a day as needed. Avoid face, groin, axilla.   Cranberry 400 MG CAPS Take by mouth.   fish oil-omega-3 fatty acids 1000 MG capsule Take 1 g by mouth daily.   gabapentin (NEURONTIN) 100 MG capsule TAKE 2 CAPSULES BY MOUTH AT BEDTIME   Glucosamine-Chondroitin 1500-1200 MG/30ML LIQD    MISC NATURAL PRODUCTS PO Herbal Supplement   Multiple Vitamin (MULTIVITAMIN) tablet Take 1 tablet by mouth daily.   mupirocin ointment (BACTROBAN) 2 % Apply topically 3 (three) times daily. Left elbow   omeprazole (PRILOSEC) 20 MG capsule Take 20 mg by mouth 2 (two) times daily.    Probiotic Product (PROBIOTIC PO) Take by mouth.   RYALTRIS 665-25 MCG/ACT SUSP INSTILL 2 SPRAYS IN EACH NOSTRIL TWICE DAILY   sildenafil (REVATIO) 20 MG tablet Take by mouth.   tacrolimus (PROTOPIC) 0.1 % ointment APPLY TO AFFECTED AREAS ONCE TO TWICE A DAY.   Tavaborole (KERYDIN) 5 % SOLN Apply to affected toenail every night until improved.   No facility-administered encounter medications on file as of 05/08/2023.     Lab Results  Component Value Date   WBC 6.1 01/25/2023   HGB 13.1 01/25/2023   HCT 40.0 01/25/2023   PLT 238.0 01/25/2023   GLUCOSE 89 01/25/2023   CHOL 190 01/25/2023   TRIG 94.0 01/25/2023   HDL 59.60 01/25/2023   LDLDIRECT 113.0 10/24/2022   LDLCALC 112 (H) 01/25/2023   ALT 12 01/25/2023   AST 20 01/25/2023   NA 139 01/25/2023   K 4.2 01/25/2023   CL 103 01/25/2023   CREATININE 0.86 01/25/2023   BUN 15 01/25/2023   CO2 29  01/25/2023   TSH 1.13 01/25/2023    VAS [place] ABI WITH/WO TBI  Result Date: 02/08/2023  LOWER EXTREMITY DOPPLER STUDY Patient Name:  Zulay Chapo Spiers  Date of Exam:   02/03/2023 Medical Rec #: [id number]     Accession #:    [id number] Date of Birth: 02/10/1961     Patient Gender: F Patient Age:   70 years Exam [facility] Procedure:      VAS [place] ABI WITH/WO TBI Referring Phys: Shreeya Recendiz --------------------------------------------------------------------------------  Indications: Claudication. High Risk Factors: No history of smoking. Other Factors: Raynaud's phenomenon with lower digit discoloration bilaterally.                Absent pulses.  Performing Technologist: Hardie Lora RVT  Examination Guidelines: A complete evaluation includes at minimum, Doppler waveform signals and systolic blood pressure reading at the level of bilateral brachial, anterior tibial, and posterior tibial arteries, when vessel segments are accessible. Bilateral testing is considered an integral part of a complete examination. Photoelectric Plethysmograph (PPG) waveforms and toe systolic pressure readings are included as required and additional duplex testing as needed. Limited examinations for reoccurring indications may be performed as noted.  ABI Findings: +---------+------------------+-----+----------+--------+ Right    Rt Pressure (mmHg)IndexWaveform  Comment  +---------+------------------+-----+----------+--------+ Brachial 143                                       +---------+------------------+-----+----------+--------+ PTA      163  1.14 triphasic          +---------+------------------+-----+----------+--------+ DP       146               1.02 monophasic         +---------+------------------+-----+----------+--------+ Great Toe99                0.69                    +---------+------------------+-----+----------+--------+  +---------+------------------+-----+----------+-------+ Left     Lt Pressure (mmHg)IndexWaveform  Comment +---------+------------------+-----+----------+-------+ Brachial 139                                      +---------+------------------+-----+----------+-------+ PTA      107               0.75 triphasic         +---------+------------------+-----+----------+-------+ DP       163               1.14 monophasic        +---------+------------------+-----+----------+-------+ Great Toe79                0.55                   +---------+------------------+-----+----------+-------+ +-------+-----------+-----------+------------+------------+ ABI/TBIToday's ABIToday's TBIPrevious ABIPrevious TBI +-------+-----------+-----------+------------+------------+ Right  1.02       0.69                                +-------+-----------+-----------+------------+------------+ Left   1.14       0.55                                +-------+-----------+-----------+------------+------------+  Summary: Right: Resting right ankle-brachial index is within normal range. The right toe-brachial index is abnormal. Left: Resting left ankle-brachial index is within normal range. The left toe-brachial index is abnormal. *See table(s) above for measurements and observations.  Electronically signed by Levora Dredge MD on 02/08/2023 at 4:27:04 PM.    Final    MM 3D SCREENING MAMMOGRAM BILATERAL BREAST  Result Date: 02/06/2023 CLINICAL DATA:  Screening. EXAM: DIGITAL SCREENING BILATERAL MAMMOGRAM WITH TOMOSYNTHESIS AND CAD TECHNIQUE: Bilateral screening digital craniocaudal and mediolateral oblique mammograms were obtained. Bilateral screening digital breast tomosynthesis was performed. The images were evaluated with computer-aided detection. COMPARISON:  Previous exam(s). ACR Breast Density Category c: The breasts are heterogeneously dense, which may obscure small masses. FINDINGS: There are no  findings suspicious for malignancy. IMPRESSION: No mammographic evidence of malignancy. A result letter of this screening mammogram will be mailed directly to the patient. RECOMMENDATION: Screening mammogram in one year. (Code:SM-B-01Y) BI-RADS CATEGORY  1: Negative. Electronically Signed   By: Annia Belt M.D.   On: 02/06/2023 12:40       Assessment & Plan:  Scleroderma [facility]) Assessment & Plan: Followed by rheumatology.  Seeing Dr Allena Katz and Dr Clelia Croft [facility]).  Fingers improved (no ulcerations).  Follow. Recent ECHO ok as outlined.  Breathing stable.    Hypercholesteremia Assessment & Plan: Low cholesterol diet and exercise.  Follow lipid panel.    History of colonic polyps Assessment & Plan: Colonoscopy 03/2021.    Gastroesophageal reflux disease with esophagitis without hemorrhage Assessment & Plan: No acid reflux reported.  Prilosec.  Skin ulcer of finger, limited to breakdown of skin [facility]) Assessment & Plan: Seeing dermatology and rheumatology.  Has f/u with Dr Sherryll Burger scheduled in 06/2023.  Has compounded cream.  Just treated with doxycycline.  Ulcers - clear.  Follow.     Anxiety Assessment & Plan: On buspar.  Currently taking 7.5mg  in am and 5mg  in pm.  Doing better on this dose.  Discussed 7.5mg  bid. Call with update.       Dale [place], MD

## 2023-05-14 ENCOUNTER — Encounter: Payer: Self-pay | Admitting: Internal Medicine

## 2023-05-14 NOTE — Assessment & Plan Note (Signed)
No acid reflux reported.  Prilosec.  

## 2023-05-14 NOTE — Assessment & Plan Note (Signed)
Followed by rheumatology.  Seeing Dr Allena Katz and Dr Clelia Croft [facility]).  Fingers improved (no ulcerations).  Follow. Recent ECHO ok as outlined.  Breathing stable.

## 2023-05-14 NOTE — Assessment & Plan Note (Signed)
On buspar.  Currently taking 7.5mg  in am and 5mg  in pm.  Doing better on this dose.  Discussed 7.5mg  bid. Call with update.

## 2023-05-14 NOTE — Assessment & Plan Note (Signed)
Low cholesterol diet and exercise.  Follow lipid panel.   

## 2023-05-14 NOTE — Assessment & Plan Note (Signed)
Colonoscopy 03/2021.  

## 2023-05-14 NOTE — Assessment & Plan Note (Signed)
Seeing dermatology and rheumatology.  Has f/u with Dr Sherryll Burger scheduled in 06/2023.  Has compounded cream.  Just treated with doxycycline.  Ulcers - clear.  Follow.

## 2023-05-15 ENCOUNTER — Ambulatory Visit
Admission: RE | Admit: 2023-05-15 | Discharge: 2023-05-15 | Disposition: A | Discharge status: Home / Self Care | Payer: BC Managed Care – PPO | Source: Ambulatory Visit | Attending: Emergency Medicine | Admitting: Emergency Medicine

## 2023-05-15 VITALS — BP 124/78 | HR 99 | Temp 97.8°F | Resp 18

## 2023-05-15 DIAGNOSIS — H6012 Cellulitis of left external ear: Secondary | ICD-10-CM

## 2023-05-15 MED ORDER — DOXYCYCLINE HYCLATE 100 MG PO CAPS
100.0000 mg | ORAL_CAPSULE | Freq: Two times a day (BID) | ORAL | 0 refills | Status: AC
Start: 2023-05-15 — End: 2023-05-22

## 2023-05-15 NOTE — ED Provider Notes (Signed)
Renaldo Fiddler    CSN: [id number] Arrival date & time: 05/15/23  1101      History   Chief Complaint Chief Complaint  Patient presents with   Insect Bite    Not sure if I was bit by a bug, but the outside of my ear is swollen. - Entered by patient    HPI Denise Arnold is a 62 y.o. female.  Patient presents with swelling, redness, itching of her left external ear x 2 days.  The redness is spreading.  She is concerned for possible insect bite.  Treatment attempted with Benadryl and hydrocortisone cream.  No fever, ear drainage, ear pain, sore throat, difficulty swallowing, shortness of breath, or other symptoms.  The history is provided by the patient and medical records.    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HCC)    Scleroderma (HCC)    positive FANA, positive SCL-70 abs, raynauds, mild pulmonary hypertension, normal DLCO   Tricuspid regurgitation    Vitamin D deficiency     Patient Active Problem List   Diagnosis Date Noted   Leg cramps 04/14/2023   Left shoulder pain 03/19/2023   SOB (shortness of breath) 01/30/2023   Absent pulse 01/30/2023   Hypercholesteremia 01/25/2023   Sinus pressure 10/30/2022   Abdominal pain 10/24/2022   COVID-19 virus infection 08/28/2022   Knee pain, left 06/17/2022   Soft tissue lesion of elbow region 05/30/2022   Sinusitis 03/27/2022   Finger ulcer (HCC) 01/16/2022   Breast pain, left 01/16/2022   Chest tightness 12/26/2021   Urinary urgency 10/11/2021   Headache 06/22/2021   Anxiety 06/06/2021   Palpitations 05/28/2021   Diarrhea 05/25/2021   Fatigue 05/25/2021   Vaginal pain 03/03/2019   Axillary fullness 01/13/2019   UTI (urinary tract infection) 10/27/2018   Cellulitis 01/16/2018   Localized swelling, mass and lump, upper limb 01/12/2018   Health care maintenance 01/25/2015    History of colonic polyps 04/22/2013   Vitamin D deficiency 01/15/2013   Degenerative cervical disc 01/15/2013   GERD (gastroesophageal reflux disease) 01/15/2013   Scleroderma (HCC) 07/17/2012   Raynauds phenomenon 07/17/2012    Past Surgical History:  Procedure Laterality Date   CESAREAN SECTION  1994   COLONOSCOPY WITH PROPOFOL N/A 05/29/2015   Procedure: COLONOSCOPY WITH PROPOFOL;  Surgeon: Scot Jun, MD;  Location: [facility] ENDOSCOPY;  Service: Endoscopy;  Laterality: N/A;   DILATION AND CURETTAGE OF UTERUS  1992   DILATION AND CURETTAGE OF UTERUS  1993   DILATION AND CURETTAGE OF UTERUS  2011   VEIN SURGERY      OB History     Gravida  4   Para  1   Term      Preterm      AB  3   Living         SAB  3   IAB      Ectopic      Multiple      Live Births           Obstetric Comments  1st Menstrual Cycle:  13  1st Pregnancy:  28           Home Medications    Prior to Admission medications   Medication Sig Start Date End Date Taking? Authorizing Provider  doxycycline (VIBRAMYCIN) 100 MG capsule  Take 1 capsule (100 mg total) by mouth 2 (two) times daily for 7 days. 05/15/23 05/22/23 Yes Mickie Bail, NP  azelastine (ASTELIN) 0.1 % nasal spray PLACE 1 SPRAY INTO BOTH NOSTRILS 2 (TWO) TIMES DAILY. USE IN EACH NOSTRIL AS DIRECTED 10/16/19   Dale [place], MD  Biotin 5000 MCG TABS  09/21/18   [provider]  busPIRone (BUSPAR) 7.5 MG tablet Take 1 tablet (7.5 mg total) by mouth 2 (two) times daily. 04/17/23   Dale [place], MD  Calcium-Magnesium-Vitamin D (CALCIUM 500 PO) Take by mouth.    [provider]  Cholecalciferol (VITAMIN D) 50 MCG (2000 UT) tablet Take 2,000 Units by mouth daily.    [provider]  ciclopirox (LOPROX) 0.77 % cream Apply topically daily. 07/14/22   Willeen Niece, MD  clobetasol cream (TEMOVATE) 0.05 % Apply to elbows twice a day as needed. Avoid face, groin, axilla. 06/16/21   Willeen Niece, MD   Cranberry 400 MG CAPS Take by mouth.    [provider]  fish oil-omega-3 fatty acids 1000 MG capsule Take 1 g by mouth daily.    [provider]  gabapentin (NEURONTIN) 100 MG capsule TAKE 2 CAPSULES BY MOUTH AT BEDTIME 04/17/23   Dale Beggs, MD  Glucosamine-Chondroitin 1500-1200 MG/30ML LIQD  01/05/23   [provider]  MISC NATURAL PRODUCTS PO Herbal Supplement    [provider]  Multiple Vitamin (MULTIVITAMIN) tablet Take 1 tablet by mouth daily.    [provider]  mupirocin ointment (BACTROBAN) 2 % Apply topically 3 (three) times daily. Left elbow 06/07/22   Dale [place], MD  omeprazole (PRILOSEC) 20 MG capsule Take 20 mg by mouth 2 (two) times daily.     [provider]  Probiotic Product (PROBIOTIC PO) Take by mouth.    [provider]  Cristal Generous [phone] MCG/ACT SUSP INSTILL 2 SPRAYS IN EACH NOSTRIL TWICE DAILY 06/03/22   [provider]  sildenafil (REVATIO) 20 MG tablet Take by mouth. 06/08/22 06/08/23  [provider]  tacrolimus (PROTOPIC) 0.1 % ointment APPLY TO AFFECTED AREAS ONCE TO TWICE A DAY. 08/22/22   Willeen Niece, MD  Tavaborole [facility]) 5 % SOLN Apply to affected toenail every night until improved. 07/14/22   Willeen Niece, MD    Family History Family History  Problem Relation Age of Onset   Heart disease Father        myocardial infarction   Arthritis/Rheumatoid Father    Hypertension Father    Hyperlipidemia Mother    Breast cancer Maternal Grandmother    Colon cancer Neg Hx     Social History Social History   Tobacco Use   Smoking status: Never   Smokeless tobacco: Never  Vaping Use   Vaping status: Never Used  Substance Use Topics   Alcohol use: No    Alcohol/week: 0.0 standard drinks of alcohol   Drug use: No     Allergies   Keflex [cephalexin], Sulfa antibiotics, Nirmatrelvir-ritonavir, and Penicillins   Review of Systems Review of Systems   Constitutional:  Negative for chills and fever.  HENT:  Negative for ear discharge, ear pain, sore throat and trouble swallowing.   Respiratory:  Negative for cough and shortness of breath.   Skin:  Positive for color change and rash.     Physical Exam Triage Vital Signs ED Triage Vitals [05/15/23 1108]  Encounter Vitals Group     BP      Systolic BP Percentile  Diastolic BP Percentile      Pulse Rate 99     Resp 18     Temp 97.8 F (36.6 C)     Temp src      SpO2 97 %     Weight      Height      Head Circumference      Peak Flow      Pain Score      Pain Loc      Pain Education      Exclude from Growth Chart    No data found.  Updated Vital Signs BP 124/78   Pulse 99   Temp 97.8 F (36.6 C)   Resp 18   LMP 02/14/2010   SpO2 97%   Visual Acuity Right Eye Distance:   Left Eye Distance:   Bilateral Distance:    Right Eye Near:   Left Eye Near:    Bilateral Near:     Physical Exam Constitutional:      General: She is not in acute distress. HENT:     Right Ear: Tympanic membrane and ear canal normal.     Left Ear: Tympanic membrane and ear canal normal.     Ears:     Comments: Left external ear has generalized erythema and edema.  No lesions or drainage.     Mouth/Throat:     Mouth: Mucous membranes are moist.  Cardiovascular:     Rate and Rhythm: Normal rate and regular rhythm.     Heart sounds: Normal heart sounds.  Pulmonary:     Effort: Pulmonary effort is normal. No respiratory distress.     Breath sounds: Normal breath sounds.  Skin:    General: Skin is warm and dry.     Findings: Erythema present. No lesion.  Neurological:     Mental Status: She is alert.      UC Treatments / Results  Labs (all labs ordered are listed, but only abnormal results are displayed) Labs Reviewed - No data to display  EKG   Radiology No results found.  Procedures Procedures (including critical care time)  Medications Ordered in UC Medications  - No data to display  Initial Impression / Assessment and Plan / UC Course  I have reviewed the triage vital signs and the nursing notes.  Pertinent labs & imaging results that were available during my care of the patient were reviewed by me and considered in my medical decision making (see chart for details).    Cellulitis of left external ear.  Patient is allergic to several antibiotics.  Treating today with doxycycline.  Instructed patient to take Zyrtec as well.  Instructed her to follow-up with her PCP and ED precautions discussed.  Education provided on cellulitis.  Patient agrees to plan of care.  Final Clinical Impressions(s) / UC Diagnoses   Final diagnoses:  Cellulitis of external ear, left     Discharge Instructions      Take the doxycycline and Zyrtec as directed.  Follow-up with your primary care provider.     ED Prescriptions     Medication Sig Dispense Auth. Provider   doxycycline (VIBRAMYCIN) 100 MG capsule Take 1 capsule (100 mg total) by mouth 2 (two) times daily for 7 days. 14 capsule Mickie Bail, NP      PDMP not reviewed this encounter.   Mickie Bail, NP 05/15/23 1152

## 2023-05-15 NOTE — ED Triage Notes (Signed)
Patient to Urgent Care with complaints of an insect bite present to her ear left ear lobe. Redness and swelling. Also has a bug bite on her forehead. No drainage.   Symptoms started Saturday. Concerned bug bite. Reports she had been outside and there were a lot of gnats/ flies.   Taking benadryl/ using hydrocortisone ointment.

## 2023-05-15 NOTE — Discharge Instructions (Addendum)
Take the doxycycline and Zyrtec as directed.  Follow-up with your primary care provider.

## 2023-05-17 ENCOUNTER — Ambulatory Visit (INDEPENDENT_AMBULATORY_CARE_PROVIDER_SITE_OTHER): Payer: BC Managed Care – PPO | Admitting: Nurse Practitioner

## 2023-05-17 ENCOUNTER — Encounter (INDEPENDENT_AMBULATORY_CARE_PROVIDER_SITE_OTHER): Payer: Self-pay | Admitting: Nurse Practitioner

## 2023-05-17 VITALS — BP 111/69 | HR 73 | Resp 16 | Ht 64.5 in | Wt 158.8 lb

## 2023-05-17 DIAGNOSIS — I7300 Raynaud's syndrome without gangrene: Secondary | ICD-10-CM

## 2023-05-17 DIAGNOSIS — I739 Peripheral vascular disease, unspecified: Secondary | ICD-10-CM | POA: Diagnosis not present

## 2023-05-17 NOTE — Progress Notes (Unsigned)
Subjective:    Patient ID: Denise Arnold, female    DOB: 01-Sep-1960, 62 y.o.   MRN: [id number] Chief Complaint  Patient presents with  . New Patient (Initial Visit)    Ref Lorin Picket abnormal ABI's,hx of scleroderma,decreased/absent pulse with left foot    HPI  Review of Systems     Objective:   Physical Exam  BP 111/69 (BP Location: Left Arm)   Pulse 73   Resp 16   Ht 5' 4.5" (1.638 m)   Wt 158 lb 12.8 oz (72 kg)   LMP 02/14/2010   BMI 26.84 kg/m   Past Medical History:  Diagnosis Date  . Arthritis   . GERD (gastroesophageal reflux disease)   . History of endoscopy 04/05/2013   upper  . Menorrhagia   . Reynolds syndrome (HCC)   . Scleroderma (HCC)    positive FANA, positive SCL-70 abs, raynauds, mild pulmonary hypertension, normal DLCO  . Tricuspid regurgitation   . Vitamin D deficiency     Social History   Socioeconomic History  . Marital status: Married    Spouse name: Not on file  . Number of children: 1  . Years of education: Not on file  . Highest education level: Bachelor's degree (e.g., BA, AB, BS)  Occupational History  . Not on file  Tobacco Use  . Smoking status: Never  . Smokeless tobacco: Never  Vaping Use  . Vaping status: Never Used  Substance and Sexual Activity  . Alcohol use: No    Alcohol/week: 0.0 standard drinks of alcohol  . Drug use: No  . Sexual activity: Not on file  Other Topics Concern  . Not on file  Social History Narrative  . Not on file   Social Determinants of Health   Financial Resource Strain: Low Risk  (05/08/2023)   Overall Financial Resource Strain (CARDIA)   . Difficulty of Paying Living Expenses: Not hard at all  Food Insecurity: No Food Insecurity (05/08/2023)   Hunger Vital Sign   . Worried About Programme researcher, broadcasting/film/video in the Last Year: Never true   . Ran Out of Food in the Last Year: Never true  Transportation Needs: No Transportation Needs (05/08/2023)   PRAPARE - Transportation   . Lack of Transportation  (Medical): No   . Lack of Transportation (Non-Medical): No  Physical Activity: Sufficiently Active (05/08/2023)   Exercise Vital Sign   . Days of Exercise per Week: 4 days   . Minutes of Exercise per Session: 50 min  Stress: Stress Concern Present (05/08/2023)   [employer] of Occupational Health - Occupational Stress Questionnaire   . Feeling of Stress : To some extent  Social Connections: Socially Integrated (05/08/2023)   Social Connection and Isolation Panel [NHANES]   . Frequency of Communication with Friends and Family: More than three times a week   . Frequency of Social Gatherings with Friends and Family: Once a week   . Attends Religious Services: More than 4 times per year   . Active Member of Clubs or Organizations: Yes   . Attends Banker Meetings: More than 4 times per year   . Marital Status: Married  Catering manager Violence: Not on file    Past Surgical History:  Procedure Laterality Date  . CESAREAN SECTION  1994  . COLONOSCOPY WITH PROPOFOL N/A 05/29/2015   Procedure: COLONOSCOPY WITH PROPOFOL;  Surgeon: Scot Jun, MD;  Location: [facility] ENDOSCOPY;  Service: Endoscopy;  Laterality: N/A;  . DILATION AND  CURETTAGE OF UTERUS  1992  . DILATION AND CURETTAGE OF UTERUS  1993  . DILATION AND CURETTAGE OF UTERUS  2011  . VEIN SURGERY      Family History  Problem Relation Age of Onset  . Heart disease Father        myocardial infarction  . Arthritis/Rheumatoid Father   . Hypertension Father   . Hyperlipidemia Mother   . Breast cancer Maternal Grandmother   . Colon cancer Neg Hx     Allergies  Allergen Reactions  . Keflex [Cephalexin] Nausea Only  . Sulfa Antibiotics Nausea And Vomiting  . Nirmatrelvir-Ritonavir Diarrhea and Palpitations  . Penicillins Rash       Latest Ref Rng & Units 01/25/2023    1:32 PM 10/24/2022   12:44 PM 12/28/2021    9:15 AM  CBC  WBC 4.0 - 10.5 K/uL 6.1  9.3  4.8   Hemoglobin 12.0 - 15.0 g/dL 82.9  56.2   13.0   Hematocrit 36.0 - 46.0 % 40.0  41.6  39.2   Platelets 150.0 - 400.0 K/uL 238.0  267.0  195.0       CMP     Component Value Date/Time   NA 139 01/25/2023 1332   NA 142 08/08/2018 0000   K 4.2 01/25/2023 1332   CL 103 01/25/2023 1332   CO2 29 01/25/2023 1332   GLUCOSE 89 01/25/2023 1332   BUN 15 01/25/2023 1332   BUN 11 08/08/2018 0000   CREATININE 0.86 01/25/2023 1332   CALCIUM 9.6 01/25/2023 1332   PROT 6.7 01/25/2023 1332   ALBUMIN 4.1 01/25/2023 1332   AST 20 01/25/2023 1332   ALT 12 01/25/2023 1332   ALKPHOS 59 01/25/2023 1332   BILITOT 0.5 01/25/2023 1332   GFR 73.06 01/25/2023 1332     No results found.     Assessment & Plan:   1. PAD (peripheral artery disease) (HCC) The patient does have some evidence of mild PAD.  However it is entirely possible that it is related to her Raynaud's and scleroderma.  Currently she does not have any symptoms and based on that I think it certainly prudent to continue to watch and monitor.  Her last studies are done in 3 months which are we will have her return in additional 3 months in order to repeat the studies.  Otherwise she should continue to work as she has been.  Patient is also advised to take a baby aspirin daily.  2. Raynaud's phenomenon without gangrene ***   Current Outpatient Medications on File Prior to Visit  Medication Sig Dispense Refill  . azelastine (ASTELIN) 0.1 % nasal spray PLACE 1 SPRAY INTO BOTH NOSTRILS 2 (TWO) TIMES DAILY. USE IN EACH NOSTRIL AS DIRECTED 30 mL 1  . Biotin 5000 MCG TABS     . busPIRone (BUSPAR) 7.5 MG tablet Take 1 tablet (7.5 mg total) by mouth 2 (two) times daily. 180 tablet 1  . Calcium-Magnesium-Vitamin D (CALCIUM 500 PO) Take by mouth.    . Cholecalciferol (VITAMIN D) 50 MCG (2000 UT) tablet Take 2,000 Units by mouth daily.    . ciclopirox (LOPROX) 0.77 % cream Apply topically daily. 30 g 0  . clobetasol cream (TEMOVATE) 0.05 % Apply to elbows twice a day as needed. Avoid  face, groin, axilla. 30 g 0  . Cranberry 400 MG CAPS Take by mouth.    . doxycycline (VIBRAMYCIN) 100 MG capsule Take 1 capsule (100 mg total) by mouth 2 (two) times daily  for 7 days. 14 capsule 0  . fish oil-omega-3 fatty acids 1000 MG capsule Take 1 g by mouth daily.    Marland Kitchen gabapentin (NEURONTIN) 100 MG capsule TAKE 2 CAPSULES BY MOUTH AT BEDTIME 60 capsule 1  . Glucosamine-Chondroitin 1500-1200 MG/30ML LIQD     . MISC NATURAL PRODUCTS PO Herbal Supplement    . Multiple Vitamin (MULTIVITAMIN) tablet Take 1 tablet by mouth daily.    . mupirocin ointment (BACTROBAN) 2 % Apply topically 3 (three) times daily. Left elbow 30 g 0  . omeprazole (PRILOSEC) 20 MG capsule Take 20 mg by mouth 2 (two) times daily.     . Probiotic Product (PROBIOTIC PO) Take by mouth.    Cristal Generous 213-08 MCG/ACT SUSP INSTILL 2 SPRAYS IN EACH NOSTRIL TWICE DAILY    . sildenafil (REVATIO) 20 MG tablet Take by mouth.    . tacrolimus (PROTOPIC) 0.1 % ointment APPLY TO AFFECTED AREAS ONCE TO TWICE A DAY. 60 g 1  . Tavaborole (KERYDIN) 5 % SOLN Apply to affected toenail every night until improved. 10 mL 2   No current facility-administered medications on file prior to visit.    There are no Patient Instructions on file for this visit. No follow-ups on file.   Georgiana Spinner, NP

## 2023-05-24 ENCOUNTER — Encounter: Payer: Self-pay | Admitting: Internal Medicine

## 2023-05-24 DIAGNOSIS — M349 Systemic sclerosis, unspecified: Secondary | ICD-10-CM

## 2023-05-24 NOTE — Telephone Encounter (Signed)
I am ok to send in another week of doxycycline 100mg  bid, but if her fingers continue to be an issue, I do want her to let Dr Allena Katz know.  Also, keep [place] posted.

## 2023-05-25 MED ORDER — DOXYCYCLINE HYCLATE 100 MG PO CAPS: 100.0000 mg | ORAL_CAPSULE | Freq: Two times a day (BID) | ORAL | 0 refills | Status: DC

## 2023-05-28 ENCOUNTER — Other Ambulatory Visit: Payer: Self-pay | Admitting: Internal Medicine

## 2023-05-28 DIAGNOSIS — M349 Systemic sclerosis, unspecified: Secondary | ICD-10-CM

## 2023-06-06 ENCOUNTER — Telehealth: Payer: Self-pay | Admitting: Internal Medicine

## 2023-06-06 DIAGNOSIS — E7800 Pure hypercholesterolemia, unspecified: Secondary | ICD-10-CM

## 2023-06-06 NOTE — Telephone Encounter (Signed)
Patient need lab orders.

## 2023-06-07 NOTE — Telephone Encounter (Signed)
Labs ordered.

## 2023-06-09 ENCOUNTER — Other Ambulatory Visit (INDEPENDENT_AMBULATORY_CARE_PROVIDER_SITE_OTHER): Payer: BC Managed Care – PPO

## 2023-06-09 DIAGNOSIS — E7800 Pure hypercholesterolemia, unspecified: Secondary | ICD-10-CM | POA: Diagnosis not present

## 2023-06-09 LAB — BASIC METABOLIC PANEL
BUN: 13 mg/dL (ref 6–23)
CO2: 30 meq/L (ref 19–32)
Calcium: 9.2 mg/dL (ref 8.4–10.5)
Chloride: 104 meq/L (ref 96–112)
Creatinine, Ser: 0.85 mg/dL (ref 0.40–1.20)
GFR: 73.9 mL/min (ref >60.00)
Glucose, Bld: 93 mg/dL (ref 70–99)
Potassium: 4.5 meq/L (ref 3.5–5.1)
Sodium: 140 meq/L (ref 135–145)

## 2023-06-09 LAB — HEPATIC FUNCTION PANEL
ALT: 12 U/L (ref 0–35)
AST: 18 U/L (ref 0–37)
Albumin: 3.9 g/dL (ref 3.5–5.2)
Alkaline Phosphatase: 57 U/L (ref 39–117)
Bilirubin, Direct: 0.1 mg/dL (ref 0.0–0.3)
Total Bilirubin: 0.6 mg/dL (ref 0.2–1.2)
Total Protein: 6.1 g/dL (ref 6.0–8.3)

## 2023-06-09 LAB — LIPID PANEL
Cholesterol: 201 mg/dL — ABNORMAL HIGH (ref 0–200)
HDL: 59.8 mg/dL (ref >39.00)
LDL Cholesterol: 126 mg/dL — ABNORMAL HIGH (ref 0–99)
NonHDL: 141.46
Total CHOL/HDL Ratio: 3
Triglycerides: 79 mg/dL (ref 0.0–149.0)
VLDL: 15.8 mg/dL (ref 0.0–40.0)

## 2023-06-13 ENCOUNTER — Ambulatory Visit (INDEPENDENT_AMBULATORY_CARE_PROVIDER_SITE_OTHER): Payer: BC Managed Care – PPO | Admitting: Internal Medicine

## 2023-06-13 ENCOUNTER — Ambulatory Visit: Payer: BC Managed Care – PPO | Admitting: Internal Medicine

## 2023-06-13 VITALS — BP 114/70 | HR 78 | Temp 97.9°F | Resp 16 | Ht 64.5 in | Wt 158.2 lb

## 2023-06-13 DIAGNOSIS — Z860100 Personal history of colon polyps, unspecified: Secondary | ICD-10-CM | POA: Diagnosis not present

## 2023-06-13 DIAGNOSIS — I7300 Raynaud's syndrome without gangrene: Secondary | ICD-10-CM | POA: Diagnosis not present

## 2023-06-13 DIAGNOSIS — M349 Systemic sclerosis, unspecified: Secondary | ICD-10-CM

## 2023-06-13 DIAGNOSIS — E7800 Pure hypercholesterolemia, unspecified: Secondary | ICD-10-CM

## 2023-06-13 DIAGNOSIS — K2100 Gastro-esophageal reflux disease with esophagitis, without bleeding: Secondary | ICD-10-CM

## 2023-06-13 DIAGNOSIS — J329 Chronic sinusitis, unspecified: Secondary | ICD-10-CM

## 2023-06-13 DIAGNOSIS — F419 Anxiety disorder, unspecified: Secondary | ICD-10-CM

## 2023-06-13 DIAGNOSIS — R0989 Other specified symptoms and signs involving the circulatory and respiratory systems: Secondary | ICD-10-CM

## 2023-06-13 MED ORDER — PREDNISONE 10 MG PO TABS: ORAL_TABLET | ORAL | 0 refills | Status: DC

## 2023-06-13 MED ORDER — DOXYCYCLINE HYCLATE 100 MG PO TABS: 100.0000 mg | ORAL_TABLET | Freq: Two times a day (BID) | ORAL | 0 refills | Status: DC

## 2023-06-13 NOTE — Progress Notes (Signed)
Subjective:    Patient ID: Denise Arnold, female    DOB: Nov 08, 1960, 62 y.o.   MRN: [id number]  Patient here for  Chief Complaint  Patient presents with   Medical Management of Chronic Issues    HPI Here for a scheduled follow up - follow up regarding increased sress/anxiety and scleroderma. Saw Dr Allena Katz 05/17/23. Recommended continuing sildenafil. Recently evaluated by [facility] for PAD - mild. Recommended f/u ABIs in 3 months. Recommended aspirin daily. Saw pulmonary 04/26/23 - stable. Recommended - continue PPI. ECHO. ECHO 04/27/23 - EF 56%, mild MR with trivial PR and mildTR. Reports that starting two weeks ago, she developed increased drainage.  Increased chest congestion.  Cough - productive of yellow mucus. No significant sore throat. Increased sinus pressure. No fever. No body aches. No vomiting or diarrhea. Finger ulcerations improved, but she does have some mild erythema - first finger (bilateral). Was seen walk in clinic - ear swelling and itching. Treated with abx. Improved.    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HCC)    Scleroderma (HCC)    positive FANA, positive SCL-70 abs, raynauds, mild pulmonary hypertension, normal DLCO   Tricuspid regurgitation    Vitamin D deficiency    Past Surgical History:  Procedure Laterality Date   CESAREAN SECTION  1994   COLONOSCOPY WITH PROPOFOL N/A 05/29/2015   Procedure: COLONOSCOPY WITH PROPOFOL;  Surgeon: Scot Jun, MD;  Location: [facility] ENDOSCOPY;  Service: Endoscopy;  Laterality: N/A;   DILATION AND CURETTAGE OF UTERUS  1992   DILATION AND CURETTAGE OF UTERUS  1993   DILATION AND CURETTAGE OF UTERUS  2011   VEIN SURGERY     Family History  Problem Relation Age of Onset   Heart disease Father        myocardial infarction   Arthritis/Rheumatoid Father    Hypertension Father    Hyperlipidemia  Mother    Breast cancer Maternal Grandmother    Colon cancer Neg Hx    Social History   Socioeconomic History   Marital status: Married    Spouse name: Not on file   Number of children: 1   Years of education: Not on file   Highest education level: Bachelor's degree (e.g., BA, AB, BS)  Occupational History   Not on file  Tobacco Use   Smoking status: Never   Smokeless tobacco: Never  Vaping Use   Vaping status: Never Used  Substance and Sexual Activity   Alcohol use: No    Alcohol/week: 0.0 standard drinks of alcohol   Drug use: No   Sexual activity: Not on file  Other Topics Concern   Not on file  Social History Narrative   Not on file   Social Determinants of Health   Financial Resource Strain: Low Risk  (05/08/2023)   Overall Financial Resource Strain (CARDIA)    Difficulty of Paying Living Expenses: Not hard at all  Food Insecurity: No Food Insecurity (05/08/2023)   Hunger Vital Sign    Worried About Running Out of Food in the Last Year: Never true    Ran Out of Food in the Last Year: Never true  Transportation Needs: No Transportation Needs (05/08/2023)   PRAPARE - Administrator, Civil Service (Medical): No    Lack of Transportation (Non-Medical): No  Physical Activity: Sufficiently Active (  05/08/2023)   Exercise Vital Sign    Days of Exercise per Week: 4 days    Minutes of Exercise per Session: 50 min  Stress: Stress Concern Present (05/08/2023)   [employer] of Occupational Health - Occupational Stress Questionnaire    Feeling of Stress : To some extent  Social Connections: Socially Integrated (05/08/2023)   Social Connection and Isolation Panel [NHANES]    Frequency of Communication with Friends and Family: More than three times a week    Frequency of Social Gatherings with Friends and Family: Once a week    Attends Religious Services: More than 4 times per year    Active Member of [employer] or Organizations: Yes    Attends Museum/gallery exhibitions officer: More than 4 times per year    Marital Status: Married     Review of Systems  Constitutional:  Negative for appetite change and fever.  HENT:  Positive for congestion, postnasal drip and sinus pressure. Negative for sore throat.   Respiratory:  Positive for cough. Negative for chest tightness and shortness of breath.   Cardiovascular:  Negative for chest pain and palpitations.  Gastrointestinal:  Negative for abdominal pain, diarrhea, nausea and vomiting.  Genitourinary:  Negative for difficulty urinating and dysuria.  Musculoskeletal:  Negative for myalgias.       No increased joint swelling.   Skin:  Negative for color change and wound.  Neurological:  Negative for dizziness and headaches.  Psychiatric/Behavioral:  Negative for agitation and dysphoric mood.        Objective:     BP 114/70   Pulse 78   Temp 97.9 F (36.6 C)   Resp 16   Ht 5' 4.5" (1.638 m)   Wt 158 lb 3.2 oz (71.8 kg)   LMP 02/14/2010   SpO2 97%   BMI 26.74 kg/m  Wt Readings from Last 3 Encounters:  06/13/23 158 lb 3.2 oz (71.8 kg)  05/17/23 158 lb 12.8 oz (72 kg)  05/08/23 158 lb (71.7 kg)    Physical Exam Vitals reviewed.  Constitutional:      General: She is not in acute distress.    Appearance: Normal appearance.  HENT:     Head: Normocephalic and atraumatic.     Ears:     Comments: Decreased swelling/erythema - left ear.     Mouth/Throat:     Pharynx: No oropharyngeal exudate or posterior oropharyngeal erythema.  Eyes:     General: No scleral icterus.       Right eye: No discharge.        Left eye: No discharge.     Conjunctiva/sclera: Conjunctivae normal.  Neck:     Thyroid: No thyromegaly.  Cardiovascular:     Rate and Rhythm: Normal rate and regular rhythm.  Pulmonary:     Effort: No respiratory distress.     Breath sounds: Normal breath sounds. No wheezing.     Comments: Increased cough with forced expiration.  Abdominal:     General: Bowel sounds are  normal.     Palpations: Abdomen is soft.     Tenderness: There is no abdominal tenderness.  Musculoskeletal:        General: No swelling or tenderness.     Cervical back: Neck supple. No tenderness.  Lymphadenopathy:     Cervical: No cervical adenopathy.  Skin:    Findings: No erythema or rash.  Neurological:     Mental Status: She is alert.  Psychiatric:  Mood and Affect: Mood normal.        Behavior: Behavior normal.      Outpatient Encounter Medications as of 06/13/2023  Medication Sig   aspirin 81 MG chewable tablet Chew 81 mg by mouth daily.   Cobalamin Combinations (B-12) [phone] MCG SUBL daily.   doxycycline (VIBRA-TABS) 100 MG tablet Take 1 tablet (100 mg total) by mouth 2 (two) times daily.   Magnesium 250 MG TABS    predniSONE (DELTASONE) 10 MG tablet Take 4 tablets x 1 day and then decrease by 1/2 tablet per day until down to zero mg.   sildenafil (REVATIO) 20 MG tablet Take 20 mg by mouth 2 (two) times daily.   azelastine (ASTELIN) 0.1 % nasal spray PLACE 1 SPRAY INTO BOTH NOSTRILS 2 (TWO) TIMES DAILY. USE IN EACH NOSTRIL AS DIRECTED   Biotin 5000 MCG TABS    busPIRone (BUSPAR) 7.5 MG tablet Take 1 tablet (7.5 mg total) by mouth 2 (two) times daily.   Calcium-Magnesium-Vitamin D (CALCIUM 500 PO) Take by mouth.   Cholecalciferol (VITAMIN D) 50 MCG (2000 UT) tablet Take 2,000 Units by mouth daily.   ciclopirox (LOPROX) 0.77 % cream Apply topically daily.   clobetasol cream (TEMOVATE) 0.05 % Apply to elbows twice a day as needed. Avoid face, groin, axilla.   Cranberry 400 MG CAPS Take by mouth.   fish oil-omega-3 fatty acids 1000 MG capsule Take 1 g by mouth daily.   gabapentin (NEURONTIN) 100 MG capsule TAKE 2 CAPSULES BY MOUTH AT BEDTIME   Glucosamine-Chondroitin 1500-1200 MG/30ML LIQD    MISC NATURAL PRODUCTS PO Herbal Supplement   Multiple Vitamin (MULTIVITAMIN) tablet Take 1 tablet by mouth daily.   mupirocin ointment (BACTROBAN) 2 % Apply topically 3  (three) times daily. Left elbow   omeprazole (PRILOSEC) 20 MG capsule TAKE 1 CAPSULE BY MOUTH TWICE A DAY   Probiotic Product (PROBIOTIC PO) Take by mouth.   RYALTRIS 665-25 MCG/ACT SUSP INSTILL 2 SPRAYS IN EACH NOSTRIL TWICE DAILY   tacrolimus (PROTOPIC) 0.1 % ointment APPLY TO AFFECTED AREAS ONCE TO TWICE A DAY.   Tavaborole (KERYDIN) 5 % SOLN Apply to affected toenail every night until improved.   [DISCONTINUED] doxycycline (VIBRAMYCIN) 100 MG capsule Take 1 capsule (100 mg total) by mouth 2 (two) times daily.   No facility-administered encounter medications on file as of 06/13/2023.     Lab Results  Component Value Date   WBC 6.1 01/25/2023   HGB 13.1 01/25/2023   HCT 40.0 01/25/2023   PLT 238.0 01/25/2023   GLUCOSE 93 06/09/2023   CHOL 201 (H) 06/09/2023   TRIG 79.0 06/09/2023   HDL 59.80 06/09/2023   LDLDIRECT 113.0 10/24/2022   LDLCALC 126 (H) 06/09/2023   ALT 12 06/09/2023   AST 18 06/09/2023   NA 140 06/09/2023   K 4.5 06/09/2023   CL 104 06/09/2023   CREATININE 0.85 06/09/2023   BUN 13 06/09/2023   CO2 30 06/09/2023   TSH 1.13 01/25/2023    No results found.     Assessment & Plan:  Hypercholesteremia Assessment & Plan: The 10-year ASCVD risk score (Arnett DK, et al., 2019) is: 2.8%   Values used to calculate the score:     Age: 20 years     Sex: Female     Is Non-Hispanic African American: No     Diabetic: No     Tobacco smoker: No     Systolic Blood Pressure: 114 mmHg     Is BP  treated: No     HDL Cholesterol: 59.8 mg/dL     Total Cholesterol: 201 mg/dL  Low cholesterol diet and exercise.  Follow lipid panel.    Scleroderma (HCC) Assessment & Plan: Followed by rheumatology.  Seeing Dr Allena Katz and Dr Clelia Croft [facility]).  Fingers improved (no ulcerations).  Some redness.  Doxycycline should cover. Follow. Recent ECHO ok as outlined.     Raynaud's phenomenon without gangrene Assessment & Plan: On sildenafill.  Seeing rheumatology and dermatology as  outlined.    History of colonic polyps Assessment & Plan: Colonoscopy 03/2021.    Gastroesophageal reflux disease with esophagitis without hemorrhage Assessment & Plan: No acid reflux reported.  Prilosec.    Anxiety Assessment & Plan: On buspar.  Currently taking 7.5mg  in am and 5mg  in pm.  Doing better on this dose.  Discussed 7.5mg  bid. Call with update.    Absent pulse Assessment & Plan:  Recently evaluated by [facility] for PAD - mild. Recommended f/u ABIs in 3 months. Recommended aspirin daily.    Sinusitis, unspecified chronicity, unspecified location Assessment & Plan: Symptoms c/w sinusitis/URI.  Continue saline flushes.  Given persistent symptoms, treat with doxycycline and prednisone taper as directed.  Follow.  Call with update.    Other orders -     predniSONE; Take 4 tablets x 1 day and then decrease by 1/2 tablet per day until down to zero mg.  Dispense: 18 tablet; Refill: 0 -     Doxycycline Hyclate; Take 1 tablet (100 mg total) by mouth 2 (two) times daily.  Dispense: 20 tablet; Refill: 0     Dale Vineland, MD

## 2023-06-13 NOTE — Assessment & Plan Note (Addendum)
The 10-year ASCVD risk score (Arnett DK, et al., 2019) is: 2.8%   Values used to calculate the score:     Age: 62 years     Sex: Female     Is Non-Hispanic African American: No     Diabetic: No     Tobacco smoker: No     Systolic Blood Pressure: 114 mmHg     Is BP treated: No     HDL Cholesterol: 59.8 mg/dL     Total Cholesterol: 201 mg/dL  Low cholesterol diet and exercise.  Follow lipid panel.

## 2023-06-16 ENCOUNTER — Other Ambulatory Visit: Payer: Self-pay | Admitting: Internal Medicine

## 2023-06-17 ENCOUNTER — Encounter: Payer: Self-pay | Admitting: Internal Medicine

## 2023-06-17 NOTE — Assessment & Plan Note (Signed)
Colonoscopy 03/2021.  

## 2023-06-17 NOTE — Assessment & Plan Note (Signed)
On sildenafill.  Seeing rheumatology and dermatology as outlined.

## 2023-06-17 NOTE — Assessment & Plan Note (Signed)
Recently evaluated by [facility] for PAD - mild. Recommended f/u ABIs in 3 months. Recommended aspirin daily.

## 2023-06-17 NOTE — Assessment & Plan Note (Signed)
Symptoms c/w sinusitis/URI.  Continue saline flushes.  Given persistent symptoms, treat with doxycycline and prednisone taper as directed.  Follow.  Call with update.

## 2023-06-17 NOTE — Assessment & Plan Note (Signed)
On buspar.  Currently taking 7.5mg  in am and 5mg  in pm.  Doing better on this dose.  Discussed 7.5mg  bid. Call with update.

## 2023-06-17 NOTE — Assessment & Plan Note (Signed)
No acid reflux reported.  Prilosec.  

## 2023-06-17 NOTE — Assessment & Plan Note (Addendum)
Followed by rheumatology.  Seeing Dr Allena Katz and Dr Clelia Croft [facility]).  Fingers improved (no ulcerations).  Some redness.  Doxycycline should cover. Follow. Recent ECHO ok as outlined.

## 2023-07-04 ENCOUNTER — Other Ambulatory Visit: Payer: Self-pay | Admitting: Dermatology

## 2023-07-04 DIAGNOSIS — B351 Tinea unguium: Secondary | ICD-10-CM

## 2023-07-05 ENCOUNTER — Encounter: Payer: BC Managed Care – PPO | Admitting: Internal Medicine

## 2023-07-10 ENCOUNTER — Ambulatory Visit: Payer: BC Managed Care – PPO | Admitting: Dermatology

## 2023-07-10 DIAGNOSIS — B070 Plantar wart: Secondary | ICD-10-CM

## 2023-07-10 DIAGNOSIS — L821 Other seborrheic keratosis: Secondary | ICD-10-CM

## 2023-07-10 DIAGNOSIS — B079 Viral wart, unspecified: Secondary | ICD-10-CM

## 2023-07-10 DIAGNOSIS — Z1283 Encounter for screening for malignant neoplasm of skin: Secondary | ICD-10-CM

## 2023-07-10 DIAGNOSIS — M349 Systemic sclerosis, unspecified: Secondary | ICD-10-CM

## 2023-07-10 DIAGNOSIS — B351 Tinea unguium: Secondary | ICD-10-CM

## 2023-07-10 DIAGNOSIS — W908XXA Exposure to other nonionizing radiation, initial encounter: Secondary | ICD-10-CM

## 2023-07-10 DIAGNOSIS — L578 Other skin changes due to chronic exposure to nonionizing radiation: Secondary | ICD-10-CM

## 2023-07-10 DIAGNOSIS — D229 Melanocytic nevi, unspecified: Secondary | ICD-10-CM

## 2023-07-10 DIAGNOSIS — D225 Melanocytic nevi of trunk: Secondary | ICD-10-CM

## 2023-07-10 DIAGNOSIS — I781 Nevus, non-neoplastic: Secondary | ICD-10-CM

## 2023-07-10 DIAGNOSIS — L814 Other melanin hyperpigmentation: Secondary | ICD-10-CM

## 2023-07-10 DIAGNOSIS — D1801 Hemangioma of skin and subcutaneous tissue: Secondary | ICD-10-CM

## 2023-07-10 NOTE — Patient Instructions (Addendum)
Recommend starting moisturizer with exfoliant (Urea, Salicylic acid, or Lactic acid) one to two times daily to help smooth rough and bumpy skin.  OTC options include Cetaphil Rough and Bumpy lotion (Urea), Eucerin Roughness Relief lotion or spot treatment cream (Urea), CeraVe SA lotion/cream for Rough and Bumpy skin (Sal Acid), Gold Bond Rough and Bumpy cream (Sal Acid), and AmLactin 12% lotion/cream (Lactic Acid).  If applying in morning, also apply sunscreen to sun-exposed areas, since these exfoliating moisturizers can increase sensitivity to sun.  Curad Mediplast use to wart at 4th plantar toe at left foot   Recommend using Curad Mediplast pads. Cut to fit wart or callus. Cover with Elastoplast waterproof tape or any waterproof band-aid. Change every 3 to 4 days, or sooner if necessary.  Treatment may require several months of regular use before results are seen.    Melanoma ABCDEs  Melanoma is the most dangerous type of skin cancer, and is the leading cause of death from skin disease.  You are more likely to develop melanoma if you: Have light-colored skin, light-colored eyes, or red or blond hair Spend a lot of time in the sun Tan regularly, either outdoors or in a tanning bed Have had blistering sunburns, especially during childhood Have a close family member who has had a melanoma Have atypical moles or large birthmarks  Early detection of melanoma is key since treatment is typically straightforward and cure rates are extremely high if we catch it early.   The first sign of melanoma is often a change in a mole or a new dark spot.  The ABCDE system is a way of remembering the signs of melanoma.  A for asymmetry:  The two halves do not match. B for border:  The edges of the growth are irregular. C for color:  A mixture of colors are present instead of an even brown color. D for diameter:  Melanomas are usually (but not always) greater than 6mm - the size of a pencil eraser. E for  evolution:  The spot keeps changing in size, shape, and color.  Please check your skin once per month between visits. You can use a small mirror in front and a large mirror behind you to keep an eye on the back side or your body.   If you see any new or changing lesions before your next follow-up, please call to schedule a visit.  Please continue daily skin protection including broad spectrum sunscreen SPF 30+ to sun-exposed areas, reapplying every 2 hours as needed when you're outdoors.   Staying in the shade or wearing long sleeves, sun glasses (UVA+UVB protection) and wide brim hats (4-inch brim around the entire circumference of the hat) are also recommended for sun protection.    Due to recent changes in healthcare laws, you may see results of your pathology and/or laboratory studies on MyChart before the doctors have had a chance to review them. We understand that in some cases there may be results that are confusing or concerning to you. Please understand that not all results are received at the same time and often the doctors may need to interpret multiple results in order to provide you with the best plan of care or course of treatment. Therefore, we ask that you please give [place] 2 business days to thoroughly review all your results before contacting the office for clarification. Should we see a critical lab result, you will be contacted sooner.   If You Need Anything After Your Visit  If  you have any questions or concerns for your doctor, please call our main line at [phone] and press option 4 to reach your doctor's medical assistant. If no one answers, please leave a voicemail as directed and we will return your call as soon as possible. Messages left after 4 pm will be answered the following business day.   You may also send [place] a message via MyChart. We typically respond to MyChart messages within 1-2 business days.  For prescription refills, please ask your pharmacy to contact our  office. Our fax number is [phone].  If you have an urgent issue when the clinic is closed that cannot wait until the next business day, you can Vencill your doctor at the number below.    Please note that while we do our best to be available for urgent issues outside of office hours, we are not available 24/7.   If you have an urgent issue and are unable to reach [place], you may choose to seek medical care at your doctor's office, retail clinic, urgent care center, or emergency room.  If you have a medical emergency, please immediately call 911 or go to the emergency department.  Pager Numbers  - Dr. Gwen Pounds: [phone]  - Dr. Roseanne Reno: [phone]  - Dr. Katrinka Blazing: [phone]   In the event of inclement weather, please call our main line at [phone] for an update on the status of any delays or closures.  Dermatology Medication Tips: Please keep the boxes that topical medications come in in order to help keep track of the instructions about where and how to use these. Pharmacies typically print the medication instructions only on the boxes and not directly on the medication tubes.   If your medication is too expensive, please contact our office at [phone] option 4 or send [place] a message through MyChart.   We are unable to tell what your co-pay for medications will be in advance as this is different depending on your insurance coverage. However, we may be able to find a substitute medication at lower cost or fill out paperwork to get insurance to cover a needed medication.   If a prior authorization is required to get your medication covered by your insurance company, please allow [place] 1-2 business days to complete this process.  Drug prices often vary depending on where the prescription is filled and some pharmacies may offer cheaper prices.  The website www.goodrx.com contains coupons for medications through different pharmacies. The prices here do not account for what the cost  may be with help from insurance (it may be cheaper with your insurance), but the website can give you the price if you did not use any insurance.  - You can print the associated coupon and take it with your prescription to the pharmacy.  - You may also stop by our office during regular business hours and pick up a GoodRx coupon card.  - If you need your prescription sent electronically to a different pharmacy, notify our office through [facility] or by phone at [phone] option 4.     Si Usted Necesita Algo Despus de Su Visita  Tambin puede enviarnos un mensaje a travs de Clinical cytogeneticist. Por lo general respondemos a los mensajes de MyChart en el transcurso de 1 a 2 das hbiles.  Para renovar recetas, por favor pida a su farmacia que se ponga en contacto con nuestra oficina. Annie Sable de fax es [place] [phone].  Si tiene un asunto urgente cuando la clnica est  cerrada y que no puede esperar hasta el siguiente da hbil, puede llamar/localizar a su doctor(a) al nmero que aparece a continuacin.   Por favor, tenga en cuenta que aunque hacemos todo lo posible para estar disponibles para asuntos urgentes fuera del horario de [place], no estamos disponibles las 24 horas del da, los 7 [address]  [address] de la [place].   Si tiene un problema urgente y no puede comunicarse con nosotros, puede optar por buscar atencin mdica  en el consultorio de su doctor(a), en una clnica privada, en un centro de atencin urgente o en una sala de emergencias.  Si tiene Engineer, drilling, por favor llame inmediatamente al 911 o vaya a la sala de emergencias.  Nmeros de bper  - Dr. Gwen Pounds: [phone]  - Dra. Roseanne Reno: [phone]  - Dr. Katrinka Blazing: [phone]   En caso de inclemencias del tiempo, por favor llame a Lacy Duverney principal al [phone] para una actualizacin sobre el [place] de cualquier retraso o cierre.  Consejos para la medicacin en dermatologa: Por favor, guarde las cajas en  las que vienen los medicamentos de uso tpico para ayudarle a seguir las instrucciones sobre dnde y cmo usarlos. Las farmacias generalmente imprimen las instrucciones del medicamento slo en las cajas y no directamente en los tubos del [place].   Si su medicamento es muy caro, por favor, pngase en contacto con Rolm Gala llamando al [phone] y presione la opcin 4 o envenos un mensaje a travs de Clinical cytogeneticist.   No podemos decirle cul ser su copago por los medicamentos por adelantado ya que esto es diferente dependiendo de la cobertura de su seguro. Sin embargo, es posible que podamos encontrar un medicamento sustituto a Audiological scientist un formulario para que el seguro cubra el medicamento que se considera necesario.   Si se requiere una autorizacin previa para que su compaa de seguros [place] su medicamento, por favor permtanos de 1 a 2 das hbiles para completar [address]  Los precios de los medicamentos varan con frecuencia dependiendo del Environmental consultant de dnde se surte la receta y alguna farmacias pueden ofrecer precios ms baratos.  El sitio web www.goodrx.com tiene cupones para medicamentos de Health and safety inspector. Los precios aqu no tienen en cuenta lo que podra costar con la ayuda del seguro (puede ser ms barato con su seguro), pero el sitio web puede darle el precio si no utiliz Tourist information centre manager.  - Puede imprimir el cupn correspondiente y llevarlo con su receta a la farmacia.  - Tambin puede pasar por nuestra oficina durante el horario de atencin regular y Education officer, museum una tarjeta de cupones de GoodRx.  - Si necesita que su receta se enve electrnicamente a una farmacia diferente, informe a nuestra oficina a travs de MyChart de [place] o por telfono llamando al [phone] y presione la opcin 4.

## 2023-07-10 NOTE — Progress Notes (Signed)
Follow-Up Visit   Subjective  Denise Arnold is a 62 y.o. female who presents for the following: Skin Cancer Screening and Full Body Skin Exam   The patient presents for Total-Body Skin Exam (TBSE) for skin cancer screening and mole check. The patient has spots, moles and lesions to be evaluated, some may be new or changing and the patient may have concern these could be cancer.    The following portions of the chart were reviewed this encounter and updated as appropriate: medications, allergies, medical history  Review of Systems:  No other skin or systemic complaints except as noted in HPI or Assessment and Plan.  Objective  Well appearing patient in no apparent distress; mood and affect are within normal limits.  A full examination was performed including scalp, head, eyes, ears, nose, lips, neck, chest, axillae, abdomen, back, buttocks, bilateral upper extremities, bilateral lower extremities, hands, feet, fingers, toes, fingernails, and toenails. All findings within normal limits unless otherwise noted below.   Relevant physical exam findings are noted in the Assessment and Plan.    Assessment & Plan   SKIN CANCER SCREENING PERFORMED TODAY.  ACTINIC DAMAGE - Chronic condition, secondary to cumulative UV/sun exposure - diffuse scaly erythematous macules with underlying dyspigmentation - Recommend daily broad spectrum sunscreen SPF 30+ to sun-exposed areas, reapply every 2 hours as needed.  - Staying in the shade or wearing long sleeves, sun glasses (UVA+UVB protection) and wide brim hats (4-inch brim around the entire circumference of the hat) are also recommended for sun protection.  - Call for new or changing lesions.  LENTIGINES, SEBORRHEIC KERATOSES, HEMANGIOMAS - Benign normal skin lesions - Benign-appearing - Call for any changes  MELANOCYTIC NEVI - Tan-brown and/or pink-flesh-colored symmetric macules and papules - Benign appearing on exam today - Observation -  Call clinic for new or changing moles - Recommend daily use of broad spectrum spf 30+ sunscreen to sun-exposed areas.   Nevus right abdomen   Exam : 6.0 mm light tan macule with darker edge stable compared to prior photo   Benign-appearing.  Stable. Observation.  Call clinic for new or changing moles.  Recommend daily use of broad spectrum spf 30+ sunscreen to sun-exposed areas.    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 Currently well-controlled.    Continue Tavaboraole solution to affected toenail at bedtime to prevent recurrence. Continue Ciclopirox cream apply to affected toe nail once a day        Scleroderma (HCC) face.hands,feet  Exam: induration of fingertips with swelling with some healed ulcerations at the tips and decrease mobility    With healed digital ulcerations. Pt with Raynaud's, Chronic and persistent condition with duration or expected duration over one year. Condition is improving with treatment but not currently at goal.    Patient will start  Botox injections for medical treatment of digital ulcerations at [facility]  Continue oral Sildenafil Citrate as prescribed by Dr Josephina Gip at [facility] .  Continue Compound Medication 0.5% timolol, 2% mupirocin, 5% sildenafil, 20% STS, and 0.5% clobetasol in an ointment base. Dispense 15g. Apply twice daily to sores on fingers as needed.  Continue Compound Medication 5% sildenafil in petroleum jelly base, Apply twice daily to hands/fingers for Raynaud's.  Continue topical sulfa/sulfur compound as prescribed  Continue tacrolimus 0.1% ointment every day/bid as needed   Continue clobetasol 0.05 % cream Apply topically 2 (two) times daily prn flares 30 g 1      WART Exam: firm verrucous papule at base of left 4th plantar toe  Counseling Discussed viral / HPV (Human Papilloma Virus) etiology and risk of spread /infectivity to other areas of body as well as to other people.  Multiple treatments and methods may be required to clear warts and it is possible treatment may not be successful.  Treatment risks include discoloration; scarring and there is still potential for wart recurrence.  Treatment Plan: Discussed treatment with LN2, otc topicals   Patient defers LN2 treatment today.  Prefers Otc Curad Mediplast  Recommend using Curad Mediplast pads. Cut to fit wart or callus. Cover with Elastoplast waterproof tape or any waterproof band-aid. Change every 3 to 4 days, or sooner if necessary.  Treatment may require several months of regular use before results are seen.    Return in about 1 year (around 07/09/2024) for TBSE.  I, Asher Muir, CMA, am acting as scribe for Willeen Niece, MD.   Documentation: I have reviewed the above documentation for accuracy and completeness, and I agree with the above.  Willeen Niece, MD

## 2023-07-17 ENCOUNTER — Ambulatory Visit: Payer: BC Managed Care – PPO | Admitting: Dermatology

## 2023-08-11 ENCOUNTER — Other Ambulatory Visit (INDEPENDENT_AMBULATORY_CARE_PROVIDER_SITE_OTHER): Payer: Self-pay | Admitting: Nurse Practitioner

## 2023-08-11 DIAGNOSIS — I739 Peripheral vascular disease, unspecified: Secondary | ICD-10-CM

## 2023-08-11 DIAGNOSIS — I7300 Raynaud's syndrome without gangrene: Secondary | ICD-10-CM

## 2023-08-16 ENCOUNTER — Encounter (INDEPENDENT_AMBULATORY_CARE_PROVIDER_SITE_OTHER): Payer: BC Managed Care – PPO

## 2023-08-16 ENCOUNTER — Ambulatory Visit (INDEPENDENT_AMBULATORY_CARE_PROVIDER_SITE_OTHER): Payer: 59

## 2023-08-16 ENCOUNTER — Ambulatory Visit (INDEPENDENT_AMBULATORY_CARE_PROVIDER_SITE_OTHER): Payer: 59 | Admitting: Nurse Practitioner

## 2023-08-16 ENCOUNTER — Encounter (INDEPENDENT_AMBULATORY_CARE_PROVIDER_SITE_OTHER): Payer: Self-pay | Admitting: Nurse Practitioner

## 2023-08-16 VITALS — BP 120/73 | HR 73 | Resp 66 | Ht 64.5 in | Wt 155.0 lb

## 2023-08-16 DIAGNOSIS — I739 Peripheral vascular disease, unspecified: Secondary | ICD-10-CM

## 2023-08-16 DIAGNOSIS — I7300 Raynaud's syndrome without gangrene: Secondary | ICD-10-CM

## 2023-08-17 ENCOUNTER — Encounter: Payer: Self-pay | Admitting: Internal Medicine

## 2023-08-17 DIAGNOSIS — I739 Peripheral vascular disease, unspecified: Secondary | ICD-10-CM | POA: Insufficient documentation

## 2023-08-17 NOTE — Progress Notes (Signed)
Subjective:    Patient ID: Denise Arnold, female    DOB: 1961-04-29, 63 y.o.   MRN: [id number] Chief Complaint  Patient presents with   Follow-up    3 month ABI    Denise Arnold is a 63 year old female who returns today due to concern for absent pedal pulses with a history of scleroderma.  The patient says that it was noted that her physical exam she had pulses that were hard to palpate.    She denies any claudication-like symptoms however she does note that at times she has some numbness in her left fourth toe but this comes and goes.  She recently visited podiatry and this was found to be a Morton's neuroma.  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  She does not endorse any pain in her legs may be consistent with claudication that caused her to be unable to teach or perform her classes.  She denies rest pain like symptoms or open wounds or ulcerations.  Initially she when she was seen she was on sildenafil but was not taking the full dose consistently.  Since her visit she has been taking more of the dose as well as a baby aspirin. In July, noninvasive studies show an ABI of 1.02 on the right and 1.14 on the left.  She has a TBI 0.69 on the right and 0.55 on the left.  She currently has triphasic/monophasic tibial artery waveforms bilaterally.  Today noninvasive studies show an ABI of 1.30 on the right and 1.28 on the left.  She has a TBI of 0.94 on the right and 0.72 on the left.  She has triphasic tibial waveforms in the right with biphasic/triphasic waveforms in the left equally with good toe waveforms    Review of Systems  Cardiovascular:  Negative for leg swelling.  Skin:  Negative for wound.  All other systems reviewed and are negative.      Objective:   Physical Exam Vitals reviewed.  HENT:     Head: Normocephalic.  Cardiovascular:     Rate and Rhythm: Normal rate.     Pulses:          Dorsalis pedis  pulses are detected w/ Doppler on the right side and detected w/ Doppler on the left side.       Posterior tibial pulses are detected w/ Doppler on the right side and detected w/ Doppler on the left side.  Pulmonary:     Effort: Pulmonary effort is normal.  Skin:    General: Skin is warm and dry.  Neurological:     Mental Status: She is alert and oriented to person, place, and time.  Psychiatric:        Mood and Affect: Mood normal.        Behavior: Behavior normal.        Thought Content: Thought content normal.        Judgment: Judgment normal.     BP 120/73   Pulse 73   Resp (!) 66   Ht 5' 4.5" (1.638 m)   Wt 155 lb (70.3 kg)   LMP 02/14/2010   BMI 26.19 kg/m   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HCC)    Scleroderma (HCC)    positive FANA, positive SCL-70  abs, raynauds, mild pulmonary hypertension, normal DLCO   Tricuspid regurgitation    Vitamin D deficiency     Social History   Socioeconomic History   Marital status: Married    Spouse name: Not on file   Number of children: 1   Years of education: Not on file   Highest education level: Bachelor's degree (e.g., BA, AB, BS)  Occupational History   Not on file  Tobacco Use   Smoking status: Never   Smokeless tobacco: Never  Vaping Use   Vaping status: Never Used  Substance and Sexual Activity   Alcohol use: No    Alcohol/week: 0.0 standard drinks of alcohol   Drug use: No   Sexual activity: Not on file  Other Topics Concern   Not on file  Social History Narrative   Not on file   Social Drivers of Health   Financial Resource Strain: Low Risk  (07/01/2023)   Overall Financial Resource Strain (CARDIA)    Difficulty of Paying Living Expenses: Not hard at all  Food Insecurity: No Food Insecurity (07/01/2023)   Hunger Vital Sign    Worried About Running Out of Food in  the Last Year: Never true    Ran Out of Food in the Last Year: Never true  Transportation Needs: No Transportation Needs (07/01/2023)   PRAPARE - Administrator, Civil Service (Medical): No    Lack of Transportation (Non-Medical): No  Physical Activity: Sufficiently Active (07/01/2023)   Exercise Vital Sign    Days of Exercise per Week: 3 days    Minutes of Exercise per Session: 50 min  Stress: No Stress Concern Present (07/01/2023)   [employer] of Occupational Health - Occupational Stress Questionnaire    Feeling of Stress : Only a little  Recent Concern: Stress - Stress Concern Present (05/08/2023)   [employer] of Occupational Health - Occupational Stress Questionnaire    Feeling of Stress : To some extent  Social Connections: Socially Integrated (07/01/2023)   Social Connection and Isolation Panel [NHANES]    Frequency of Communication with Friends and Family: More than three times a week    Frequency of Social Gatherings with Friends and Family: Three times a week    Attends Religious Services: More than 4 times per year    Active Member of Clubs or Organizations: Yes    Attends Banker Meetings: 1 to 4 times per year    Marital Status: Married  Catering manager Violence: Not on file    Past Surgical History:  Procedure Laterality Date   CESAREAN SECTION  1994   COLONOSCOPY WITH PROPOFOL N/A 05/29/2015   Procedure: COLONOSCOPY WITH PROPOFOL;  Surgeon: Scot Jun, MD;  Location: [facility] ENDOSCOPY;  Service: Endoscopy;  Laterality: N/A;   DILATION AND CURETTAGE OF UTERUS  1992   DILATION AND CURETTAGE OF UTERUS  1993   DILATION AND CURETTAGE OF UTERUS  2011   VEIN SURGERY      Family History  Problem Relation Age of Onset   Heart disease Father        myocardial infarction   Arthritis/Rheumatoid Father    Hypertension Father    Hyperlipidemia Mother    Breast cancer Maternal Grandmother    Colon cancer Neg Hx     Allergies   Allergen Reactions   Sulfa Antibiotics Nausea And Vomiting and Nausea Only   Keflex [Cephalexin] Nausea Only   Nirmatrelvir-Ritonavir Diarrhea and Palpitations   Penicillins Rash and Other (See Comments)  Latest Ref Rng & Units 01/25/2023    1:32 PM 10/24/2022   12:44 PM 12/28/2021    9:15 AM  CBC  WBC 4.0 - 10.5 K/uL 6.1  9.3  4.8   Hemoglobin 12.0 - 15.0 g/dL 09.8  11.9  14.7   Hematocrit 36.0 - 46.0 % 40.0  41.6  39.2   Platelets 150.0 - 400.0 K/uL 238.0  267.0  195.0       CMP     Component Value Date/Time   NA 140 06/09/2023 0853   NA 142 08/08/2018 0000   K 4.5 06/09/2023 0853   CL 104 06/09/2023 0853   CO2 30 06/09/2023 0853   GLUCOSE 93 06/09/2023 0853   BUN 13 06/09/2023 0853   BUN 11 08/08/2018 0000   CREATININE 0.85 06/09/2023 0853   CALCIUM 9.2 06/09/2023 0853   PROT 6.1 06/09/2023 0853   ALBUMIN 3.9 06/09/2023 0853   AST 18 06/09/2023 0853   ALT 12 06/09/2023 0853   ALKPHOS 57 06/09/2023 0853   BILITOT 0.6 06/09/2023 0853   GFR 73.90 06/09/2023 0853     No results found.     Assessment & Plan:   1. PAD (peripheral artery disease) (HCC) Today the patient's noninvasive studies show improvement.  She has some very mild PAD but some of this I suspect it is mediated by her scleroderma.  She does not have significant symptoms.  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  We discussed other concerning symptoms such as development of ulceration or development of rest pain which we will follow-up sooner for.  Based on her most recent noninvasive studies will have her return in 1 year.  2. Raynaud's phenomenon without gangrene I believe that her taking her full dose is certainly helped her studies today.  She is encouraged to continue with aspirin and full dose of her sildenafil  Current Outpatient Medications on File Prior to Visit  Medication Sig Dispense  Refill   aspirin 81 MG chewable tablet Chew 81 mg by mouth daily.     azelastine (ASTELIN) 0.1 % nasal spray PLACE 1 SPRAY INTO BOTH NOSTRILS 2 (TWO) TIMES DAILY. USE IN EACH NOSTRIL AS DIRECTED 30 mL 1   Biotin 5000 MCG TABS      busPIRone (BUSPAR) 7.5 MG tablet Take 1 tablet (7.5 mg total) by mouth 2 (two) times daily. 180 tablet 1   Calcium-Magnesium-Vitamin D (CALCIUM 500 PO) Take by mouth.     Cholecalciferol (VITAMIN D) 50 MCG (2000 UT) tablet Take 2,000 Units by mouth daily.     ciclopirox (LOPROX) 0.77 % cream Apply topically daily. 30 g 0   clobetasol cream (TEMOVATE) 0.05 % Apply to elbows twice a day as needed. Avoid face, groin, axilla. 30 g 0   Cobalamin Combinations (B-12) [phone] MCG SUBL daily.     Cranberry 400 MG CAPS Take by mouth.     doxycycline (VIBRA-TABS) 100 MG tablet Take 1 tablet (100 mg total) by mouth 2 (two) times daily. 20 tablet 0   fish oil-omega-3 fatty acids 1000 MG capsule Take 1 g by mouth daily.     gabapentin (NEURONTIN) 100 MG capsule TAKE 2 CAPSULES BY MOUTH AT BEDTIME 60 capsule 1   Glucosamine-Chondroitin 1500-1200 MG/30ML LIQD      Magnesium 250 MG TABS      MISC NATURAL PRODUCTS PO Herbal Supplement     Multiple Vitamin (  MULTIVITAMIN) tablet Take 1 tablet by mouth daily.     mupirocin ointment (BACTROBAN) 2 % Apply topically 3 (three) times daily. Left elbow 30 g 0   omeprazole (PRILOSEC) 20 MG capsule TAKE 1 CAPSULE BY MOUTH TWICE A DAY 180 capsule 1   predniSONE (DELTASONE) 10 MG tablet Take 4 tablets x 1 day and then decrease by 1/2 tablet per day until down to zero mg. 18 tablet 0   Probiotic Product (PROBIOTIC PO) Take by mouth.     RYALTRIS 665-25 MCG/ACT SUSP INSTILL 2 SPRAYS IN EACH NOSTRIL TWICE DAILY     SANTYL 250 UNIT/GM ointment Apply 1 Application topically daily.     sildenafil (REVATIO) 20 MG tablet Take 20 mg by mouth 2 (two) times daily.     tacrolimus (PROTOPIC) 0.1 % ointment APPLY TO AFFECTED AREAS ONCE TO TWICE A DAY. 60 g 1    Tavaborole 5 % SOLN APPLY TO AFFECTED TOENAIL EVERY NIGHT UNTIL IMPROVED. 10 mL 2   No current facility-administered medications on file prior to visit.    There are no Patient Instructions on file for this visit. No follow-ups on file.   Georgiana Spinner, NP

## 2023-08-18 ENCOUNTER — Other Ambulatory Visit: Payer: Self-pay | Admitting: Internal Medicine

## 2023-08-18 LAB — VAS US ABI WITH/WO TBI
Left ABI: 1.28
Right ABI: 1.3

## 2023-08-22 ENCOUNTER — Encounter: Payer: Self-pay | Admitting: Internal Medicine

## 2023-08-22 NOTE — Telephone Encounter (Signed)
 Patient has been scheduled for tomorrow

## 2023-08-23 ENCOUNTER — Encounter: Payer: Self-pay | Admitting: Internal Medicine

## 2023-08-23 ENCOUNTER — Ambulatory Visit: Payer: 59 | Admitting: Internal Medicine

## 2023-08-23 VITALS — BP 108/68 | HR 75 | Temp 98.2°F | Resp 16 | Ht 64.5 in | Wt 159.0 lb

## 2023-08-23 DIAGNOSIS — E7800 Pure hypercholesterolemia, unspecified: Secondary | ICD-10-CM | POA: Diagnosis not present

## 2023-08-23 DIAGNOSIS — F419 Anxiety disorder, unspecified: Secondary | ICD-10-CM | POA: Diagnosis not present

## 2023-08-23 DIAGNOSIS — M349 Systemic sclerosis, unspecified: Secondary | ICD-10-CM | POA: Diagnosis not present

## 2023-08-23 DIAGNOSIS — I739 Peripheral vascular disease, unspecified: Secondary | ICD-10-CM | POA: Diagnosis not present

## 2023-08-23 MED ORDER — DOXYCYCLINE HYCLATE 100 MG PO TABS: 100.0000 mg | ORAL_TABLET | Freq: Two times a day (BID) | ORAL | 0 refills | Status: DC

## 2023-08-23 MED ORDER — GABAPENTIN 300 MG PO CAPS: 300.0000 mg | ORAL_CAPSULE | Freq: Every day | ORAL | 3 refills | Status: DC

## 2023-08-23 NOTE — Assessment & Plan Note (Signed)
 Continue buspar  7.5mg  bid.  Increase gabapentin  to 300mg  q hs. Follow.

## 2023-08-23 NOTE — Progress Notes (Signed)
 Subjective:    Patient ID: Denise Arnold, female    DOB: [birth year], 62 y.o.   MRN: [id number]  Patient here for  Chief Complaint  Patient presents with   Scleroderma    HPI Here for work in appt - work in with concerns regarding ulcers - fingertips. Has a history of scleroderma. Has raynaud's. Taking slidenafil 20mg  bid and using topical sildenafil and santyl ointments. Continues on baby aspirin. Reports noticing this past week, some increased discomfort in her fingers. Some increased redness. History of ulcers. Due to receive botox injections next week. Still exercising. No chest pain or sob reported. Does report some persistent issues with increased stress/anxiety. On buspar  7.5mg  bid. Also takes gabapentin  100mg  - 2 q hs. Feels like may need to increase dose to help with sleeping at night.    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HCC)    Scleroderma (HCC)    positive FANA, positive SCL-70 abs, raynauds, mild pulmonary hypertension, normal DLCO   Tricuspid regurgitation    Vitamin D  deficiency    Past Surgical History:  Procedure Laterality Date   CESAREAN SECTION  1994   COLONOSCOPY WITH PROPOFOL  N/A 05/29/2015   Procedure: COLONOSCOPY WITH PROPOFOL ;  Surgeon: Lamar ONEIDA Holmes, MD;  Location: [facility] ENDOSCOPY;  Service: Endoscopy;  Laterality: N/A;   DILATION AND CURETTAGE OF UTERUS  1992   DILATION AND CURETTAGE OF UTERUS  1993   DILATION AND CURETTAGE OF UTERUS  2011   VEIN SURGERY     Family History  Problem Relation Age of Onset   Heart disease Father        myocardial infarction   Arthritis/Rheumatoid Father    Hypertension Father    Hyperlipidemia Mother    Breast cancer Maternal Grandmother    Colon cancer Neg Hx    Social History   Socioeconomic History   Marital status: Married    Spouse name: Not on file   Number of  children: 1   Years of education: Not on file   Highest education level: Bachelor's degree (e.g., BA, AB, BS)  Occupational History   Not on file  Tobacco Use   Smoking status: Never   Smokeless tobacco: Never  Vaping Use   Vaping status: Never Used  Substance and Sexual Activity   Alcohol use: No    Alcohol/week: 0.0 standard drinks of alcohol   Drug use: No   Sexual activity: Not on file  Other Topics Concern   Not on file  Social History Narrative   Not on file   Social Drivers of Health   Financial Resource Strain: Low Risk  (07/01/2023)   Overall Financial Resource Strain (CARDIA)    Difficulty of Paying Living Expenses: Not hard at all  Food Insecurity: No Food Insecurity (07/01/2023)   Hunger Vital Sign    Worried About Running Out of Food in the Last Year: Never true    Ran Out of Food in the Last Year: Never true  Transportation Needs: No Transportation Needs (07/01/2023)   PRAPARE - Administrator, Civil Service (Medical): No    Lack of Transportation (Non-Medical): No  Physical Activity: Sufficiently Active (07/01/2023)   Exercise Vital Sign    Days of Exercise per Week: 3 days    Minutes of Exercise per Session: 50 min  Stress:  No Stress Concern Present (07/01/2023)   [employer] of Occupational Health - Occupational Stress Questionnaire    Feeling of Stress : Only a little  Recent Concern: Stress - Stress Concern Present (05/08/2023)   [employer] of Occupational Health - Occupational Stress Questionnaire    Feeling of Stress : To some extent  Social Connections: Socially Integrated (07/01/2023)   Social Connection and Isolation Panel [NHANES]    Frequency of Communication with Friends and Family: More than three times a week    Frequency of Social Gatherings with Friends and Family: Three times a week    Attends Religious Services: More than 4 times per year    Active Member of Clubs or Organizations: Yes    Attends Tax Inspector Meetings: 1 to 4 times per year    Marital Status: Married     Review of Systems  Constitutional:  Negative for appetite change and unexpected weight change.  HENT:  Negative for congestion and sinus pressure.   Respiratory:  Negative for cough, chest tightness and shortness of breath.   Cardiovascular:  Negative for chest pain, palpitations and leg swelling.  Gastrointestinal:  Negative for abdominal pain, diarrhea, nausea and vomiting.  Genitourinary:  Negative for difficulty urinating and dysuria.  Musculoskeletal:  Negative for myalgias.  Skin:  Negative for color change and rash.  Neurological:  Negative for dizziness and headaches.  Psychiatric/Behavioral:  Negative for agitation and dysphoric mood.        Increased stress/anxiety as outlined.        Objective:     BP 108/68   Pulse 75   Temp 98.2 F (36.8 C)   Resp 16   Ht 5' 4.5 (1.638 m)   Wt 159 lb (72.1 kg)   LMP 02/14/2010   SpO2 97%   BMI 26.87 kg/m  Wt Readings from Last 3 Encounters:  08/23/23 159 lb (72.1 kg)  08/16/23 155 lb (70.3 kg)  06/13/23 158 lb 3.2 oz (71.8 kg)    Physical Exam Vitals reviewed.  Constitutional:      General: She is not in acute distress.    Appearance: Normal appearance.  HENT:     Head: Normocephalic and atraumatic.     Right Ear: External ear normal.     Left Ear: External ear normal.     Mouth/Throat:     Pharynx: No oropharyngeal exudate or posterior oropharyngeal erythema.  Eyes:     General: No scleral icterus.       Right eye: No discharge.        Left eye: No discharge.     Conjunctiva/sclera: Conjunctivae normal.  Neck:     Thyroid : No thyromegaly.  Cardiovascular:     Rate and Rhythm: Normal rate and regular rhythm.  Pulmonary:     Effort: No respiratory distress.     Breath sounds: Normal breath sounds. No wheezing.  Abdominal:     General: Bowel sounds are normal.     Palpations: Abdomen is soft.     Tenderness: There is no abdominal  tenderness.  Musculoskeletal:        General: No swelling or tenderness.     Cervical back: Neck supple. No tenderness.  Lymphadenopathy:     Cervical: No cervical adenopathy.  Skin:    Findings: No rash.     Comments: Erythema - tips > 3rd and 4th finger.   Neurological:     Mental Status: She is alert.  Psychiatric:  Mood and Affect: Mood normal.        Behavior: Behavior normal.         Outpatient Encounter Medications as of 08/23/2023  Medication Sig   doxycycline  (VIBRA -TABS) 100 MG tablet Take 1 tablet (100 mg total) by mouth 2 (two) times daily.   gabapentin  (NEURONTIN ) 300 MG capsule Take 1 capsule (300 mg total) by mouth at bedtime.   aspirin 81 MG chewable tablet Chew 81 mg by mouth daily.   azelastine  (ASTELIN ) 0.1 % nasal spray PLACE 1 SPRAY INTO BOTH NOSTRILS 2 (TWO) TIMES DAILY. USE IN EACH NOSTRIL AS DIRECTED   Biotin 5000 MCG TABS    busPIRone  (BUSPAR ) 7.5 MG tablet Take 1 tablet (7.5 mg total) by mouth 2 (two) times daily.   Calcium-Magnesium -Vitamin D  (CALCIUM 500 PO) Take by mouth.   Cholecalciferol (VITAMIN D ) 50 MCG (2000 UT) tablet Take 2,000 Units by mouth daily.   ciclopirox  (LOPROX ) 0.77 % cream Apply topically daily.   clobetasol  cream (TEMOVATE ) 0.05 % Apply to elbows twice a day as needed. Avoid face, groin, axilla.   Cobalamin Combinations (B-12) [phone] MCG SUBL daily.   Cranberry 400 MG CAPS Take by mouth.   fish oil-omega-3 fatty acids 1000 MG capsule Take 1 g by mouth daily.   Glucosamine-Chondroitin 1500-1200 MG/30ML LIQD    Magnesium  250 MG TABS    MISC NATURAL PRODUCTS PO Herbal Supplement   Multiple Vitamin (MULTIVITAMIN) tablet Take 1 tablet by mouth daily.   mupirocin  ointment (BACTROBAN ) 2 % Apply topically 3 (three) times daily. Left elbow   omeprazole  (PRILOSEC) 20 MG capsule TAKE 1 CAPSULE BY MOUTH TWICE A DAY   Probiotic Product (PROBIOTIC PO) Take by mouth.   RYALTRIS 665-25 MCG/ACT SUSP INSTILL 2 SPRAYS IN EACH NOSTRIL TWICE  DAILY   SANTYL 250 UNIT/GM ointment Apply 1 Application topically daily.   sildenafil (REVATIO) 20 MG tablet Take 20 mg by mouth 2 (two) times daily.   tacrolimus  (PROTOPIC ) 0.1 % ointment APPLY TO AFFECTED AREAS ONCE TO TWICE A DAY.   Tavaborole  5 % SOLN APPLY TO AFFECTED TOENAIL EVERY NIGHT UNTIL IMPROVED.   [DISCONTINUED] doxycycline  (VIBRA -TABS) 100 MG tablet Take 1 tablet (100 mg total) by mouth 2 (two) times daily.   [DISCONTINUED] gabapentin  (NEURONTIN ) 100 MG capsule TAKE 2 CAPSULES BY MOUTH AT BEDTIME   [DISCONTINUED] predniSONE  (DELTASONE ) 10 MG tablet Take 4 tablets x 1 day and then decrease by 1/2 tablet per day until down to zero mg.   No facility-administered encounter medications on file as of 08/23/2023.     Lab Results  Component Value Date   WBC 6.1 01/25/2023   HGB 13.1 01/25/2023   HCT 40.0 01/25/2023   PLT 238.0 01/25/2023   GLUCOSE 93 06/09/2023   CHOL 201 (H) 06/09/2023   TRIG 79.0 06/09/2023   HDL 59.80 06/09/2023   LDLDIRECT 113.0 10/24/2022   LDLCALC 126 (H) 06/09/2023   ALT 12 06/09/2023   AST 18 06/09/2023   NA 140 06/09/2023   K 4.5 06/09/2023   CL 104 06/09/2023   CREATININE 0.85 06/09/2023   BUN 13 06/09/2023   CO2 30 06/09/2023   TSH 1.13 01/25/2023       Assessment & Plan:  Scleroderma (HCC) Assessment & Plan: Followed by rheumatology.  Seeing Dr Tobie and Dr Loreli [facility]).  Fingers - Some redness.  Doxycycline  should cover. Follow. Recent ECHO ok. Continue -  slidenafil 20mg  bid and using topical sildenafil and santyl ointments.   PAD (peripheral  artery disease) [facility]) Assessment & Plan: Saw vascular 08/16/23 - noninvasive studies showed improvement. Recommended f/u in one year.    Hypercholesteremia Assessment & Plan: The 10-year ASCVD risk score (Arnett DK, et al., 2019) is: 2.5%   Values used to calculate the score:     Age: 82 years     Sex: Female     Is Non-Hispanic African American: No     Diabetic: No     Tobacco smoker:  No     Systolic Blood Pressure: 108 mmHg     Is BP treated: No     HDL Cholesterol: 59.8 mg/dL     Total Cholesterol: 201 mg/dL  Low cholesterol diet and exercise.  Follow lipid panel.    Anxiety Assessment & Plan: Continue buspar  7.5mg  bid.  Increase gabapentin  to 300mg  q hs. Follow.    Other orders -     Doxycycline  Hyclate; Take 1 tablet (100 mg total) by mouth 2 (two) times daily.  Dispense: 14 tablet; Refill: 0 -     Gabapentin ; Take 1 capsule (300 mg total) by mouth at bedtime.  Dispense: 30 capsule; Refill: 3     Allena Hamilton, MD

## 2023-08-23 NOTE — Assessment & Plan Note (Signed)
 The 10-year ASCVD risk score (Arnett DK, et al., 2019) is: 2.5%   Values used to calculate the score:     Age: 63 years     Sex: Female     Is Non-Hispanic African American: No     Diabetic: No     Tobacco smoker: No     Systolic Blood Pressure: 108 mmHg     Is BP treated: No     HDL Cholesterol: 59.8 mg/dL     Total Cholesterol: 201 mg/dL  Low cholesterol diet and exercise.  Follow lipid panel.

## 2023-08-23 NOTE — Assessment & Plan Note (Signed)
 Followed by rheumatology.  Seeing Dr Lydia Sams and Dr Bernetta Brilliant [facility]).  Fingers - Some redness.  Doxycycline  should cover. Follow. Recent ECHO ok. Continue -  slidenafil 20mg  bid and using topical sildenafil and santyl ointments.

## 2023-08-23 NOTE — Assessment & Plan Note (Signed)
 Saw vascular 08/16/23 - noninvasive studies showed improvement. Recommended f/u in one year.

## 2023-09-04 ENCOUNTER — Telehealth: Payer: Self-pay | Admitting: Internal Medicine

## 2023-09-04 DIAGNOSIS — M349 Systemic sclerosis, unspecified: Secondary | ICD-10-CM

## 2023-09-04 DIAGNOSIS — E7800 Pure hypercholesterolemia, unspecified: Secondary | ICD-10-CM

## 2023-09-04 DIAGNOSIS — E559 Vitamin D deficiency, unspecified: Secondary | ICD-10-CM

## 2023-09-04 NOTE — Telephone Encounter (Signed)
 Order placed for labs.

## 2023-09-04 NOTE — Telephone Encounter (Signed)
 Patient need lab orders.

## 2023-09-11 ENCOUNTER — Other Ambulatory Visit (INDEPENDENT_AMBULATORY_CARE_PROVIDER_SITE_OTHER): Payer: 59

## 2023-09-11 DIAGNOSIS — E559 Vitamin D deficiency, unspecified: Secondary | ICD-10-CM

## 2023-09-11 DIAGNOSIS — M349 Systemic sclerosis, unspecified: Secondary | ICD-10-CM

## 2023-09-11 DIAGNOSIS — E7800 Pure hypercholesterolemia, unspecified: Secondary | ICD-10-CM | POA: Diagnosis not present

## 2023-09-11 LAB — CBC WITH DIFFERENTIAL/PLATELET
Basophils Absolute: 0 10*3/uL (ref 0.0–0.1)
Basophils Relative: 0.6 % (ref 0.0–3.0)
Eosinophils Absolute: 0.1 10*3/uL (ref 0.0–0.7)
Eosinophils Relative: 2.1 % (ref 0.0–5.0)
HCT: 40.1 % (ref 36.0–46.0)
Hemoglobin: 13.4 g/dL (ref 12.0–15.0)
Lymphocytes Relative: 27.7 % (ref 12.0–46.0)
Lymphs Abs: 1.3 10*3/uL (ref 0.7–4.0)
MCHC: 33.5 g/dL (ref 30.0–36.0)
MCV: 91.5 fl (ref 78.0–100.0)
Monocytes Absolute: 0.3 10*3/uL (ref 0.1–1.0)
Monocytes Relative: 7.1 % (ref 3.0–12.0)
Neutro Abs: 3 10*3/uL (ref 1.4–7.7)
Neutrophils Relative %: 62.5 % (ref 43.0–77.0)
Platelets: 205 10*3/uL (ref 150.0–400.0)
RBC: 4.38 Mil/uL (ref 3.87–5.11)
RDW: 13.7 % (ref 11.5–15.5)
WBC: 4.9 10*3/uL (ref 4.0–10.5)

## 2023-09-11 LAB — BASIC METABOLIC PANEL
BUN: 13 mg/dL (ref 6–23)
CO2: 28 meq/L (ref 19–32)
Calcium: 9.2 mg/dL (ref 8.4–10.5)
Chloride: 105 meq/L (ref 96–112)
Creatinine, Ser: 0.82 mg/dL (ref 0.40–1.20)
GFR: 77.01 mL/min (ref >60.00)
Glucose, Bld: 95 mg/dL (ref 70–99)
Potassium: 4.3 meq/L (ref 3.5–5.1)
Sodium: 141 meq/L (ref 135–145)

## 2023-09-11 LAB — VITAMIN D 25 HYDROXY (VIT D DEFICIENCY, FRACTURES): VITD: 55.05 ng/mL (ref 30.00–100.00)

## 2023-09-11 LAB — HEPATIC FUNCTION PANEL
ALT: 12 U/L (ref 0–35)
AST: 17 U/L (ref 0–37)
Albumin: 4.1 g/dL (ref 3.5–5.2)
Alkaline Phosphatase: 54 U/L (ref 39–117)
Bilirubin, Direct: 0.1 mg/dL (ref 0.0–0.3)
Total Bilirubin: 0.5 mg/dL (ref 0.2–1.2)
Total Protein: 6.5 g/dL (ref 6.0–8.3)

## 2023-09-11 LAB — LIPID PANEL
Cholesterol: 199 mg/dL (ref 0–200)
HDL: 53.5 mg/dL (ref >39.00)
LDL Cholesterol: 117 mg/dL — ABNORMAL HIGH (ref 0–99)
NonHDL: 145.51
Total CHOL/HDL Ratio: 4
Triglycerides: 145 mg/dL (ref 0.0–149.0)
VLDL: 29 mg/dL (ref 0.0–40.0)

## 2023-09-12 ENCOUNTER — Other Ambulatory Visit: Payer: BC Managed Care – PPO

## 2023-09-15 ENCOUNTER — Encounter: Payer: Self-pay | Admitting: Internal Medicine

## 2023-09-15 ENCOUNTER — Ambulatory Visit: Payer: 59 | Admitting: Internal Medicine

## 2023-09-15 VITALS — BP 110/68 | HR 68 | Temp 98.0°F | Resp 16 | Ht 64.5 in | Wt 158.8 lb

## 2023-09-15 DIAGNOSIS — F419 Anxiety disorder, unspecified: Secondary | ICD-10-CM

## 2023-09-15 DIAGNOSIS — Z860100 Personal history of colon polyps, unspecified: Secondary | ICD-10-CM

## 2023-09-15 DIAGNOSIS — M349 Systemic sclerosis, unspecified: Secondary | ICD-10-CM | POA: Diagnosis not present

## 2023-09-15 DIAGNOSIS — E7800 Pure hypercholesterolemia, unspecified: Secondary | ICD-10-CM

## 2023-09-15 DIAGNOSIS — I739 Peripheral vascular disease, unspecified: Secondary | ICD-10-CM | POA: Diagnosis not present

## 2023-09-15 MED ORDER — DOXYCYCLINE HYCLATE 100 MG PO TABS: 100.0000 mg | ORAL_TABLET | Freq: Two times a day (BID) | ORAL | 0 refills | Status: DC

## 2023-09-15 NOTE — Progress Notes (Signed)
 Subjective:    Patient ID: Denise Arnold, female    DOB: 14-Dec-1960, 63 y.o.   MRN: [id number]  Patient here for  Chief Complaint  Patient presents with   Medical Management of Chronic Issues    HPI Here for a scheduled follow up - f/u regarding hypercholesterolemia, anxiety and scleroderma. She is exercising regularly. No chest pain or sob reported. No abdominal pain or bowel change reported. Does have redness - left great toe. Tenderness to palpation - tip of left great toe. No pain extending up the toe. No injury or trauma. Overall appears to be doing better with handling stress.    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HCC)    Scleroderma (HCC)    positive FANA, positive SCL-70 abs, raynauds, mild pulmonary hypertension, normal DLCO   Tricuspid regurgitation    Vitamin D deficiency    Past Surgical History:  Procedure Laterality Date   CESAREAN SECTION  1994   COLONOSCOPY WITH PROPOFOL N/A 05/29/2015   Procedure: COLONOSCOPY WITH PROPOFOL;  Surgeon: Scot Jun, MD;  Location: [facility] ENDOSCOPY;  Service: Endoscopy;  Laterality: N/A;   DILATION AND CURETTAGE OF UTERUS  1992   DILATION AND CURETTAGE OF UTERUS  1993   DILATION AND CURETTAGE OF UTERUS  2011   VEIN SURGERY     Family History  Problem Relation Age of Onset   Heart disease Father        myocardial infarction   Arthritis/Rheumatoid Father    Hypertension Father    Hyperlipidemia Mother    Breast cancer Maternal Grandmother    Colon cancer Neg Hx    Social History   Socioeconomic History   Marital status: Married    Spouse name: Not on file   Number of children: 1   Years of education: Not on file   Highest education level: Bachelor's degree (e.g., BA, AB, BS)  Occupational History   Not on file  Tobacco Use   Smoking status: Never   Smokeless tobacco: Never   Vaping Use   Vaping status: Never Used  Substance and Sexual Activity   Alcohol use: No    Alcohol/week: 0.0 standard drinks of alcohol   Drug use: No   Sexual activity: Not on file  Other Topics Concern   Not on file  Social History Narrative   Not on file   Social Drivers of Health   Financial Resource Strain: Low Risk  (07/01/2023)   Overall Financial Resource Strain (CARDIA)    Difficulty of Paying Living Expenses: Not hard at all  Food Insecurity: No Food Insecurity (07/01/2023)   Hunger Vital Sign    Worried About Running Out of Food in the Last Year: Never true    Ran Out of Food in the Last Year: Never true  Transportation Needs: No Transportation Needs (07/01/2023)   PRAPARE - Administrator, Civil Service (Medical): No    Lack of Transportation (Non-Medical): No  Physical Activity: Sufficiently Active (07/01/2023)   Exercise Vital Sign    Days of Exercise per Week: 3 days    Minutes of Exercise per Session: 50 min  Stress: No Stress Concern Present (07/01/2023)   [employer] of Occupational Health - Occupational Stress Questionnaire    Feeling of Stress : Only a little  Recent Concern: Stress - Stress  Concern Present (05/08/2023)   [employer] of Occupational Health - Occupational Stress Questionnaire    Feeling of Stress : To some extent  Social Connections: Socially Integrated (07/01/2023)   Social Connection and Isolation Panel [NHANES]    Frequency of Communication with Friends and Family: More than three times a week    Frequency of Social Gatherings with Friends and Family: Three times a week    Attends Religious Services: More than 4 times per year    Active Member of Clubs or Organizations: Yes    Attends Banker Meetings: 1 to 4 times per year    Marital Status: Married     Review of Systems  Constitutional:  Negative for appetite change, fever and unexpected weight change.  HENT:  Negative for congestion and  sinus pressure.   Respiratory:  Negative for cough, chest tightness and shortness of breath.   Cardiovascular:  Negative for chest pain, palpitations and leg swelling.  Gastrointestinal:  Negative for abdominal pain, diarrhea, nausea and vomiting.  Genitourinary:  Negative for difficulty urinating and dysuria.  Musculoskeletal:  Negative for myalgias.  Skin:        Erythema - tip left great toe - tenderness to palpation - tip.  No erythema extending up the toe.   Neurological:  Negative for dizziness and headaches.  Psychiatric/Behavioral:  Negative for agitation and dysphoric mood.        Objective:     BP 110/68   Pulse 68   Temp 98 F (36.7 C)   Resp 16   Ht 5' 4.5" (1.638 m)   Wt 158 lb 12.8 oz (72 kg)   LMP 02/14/2010   SpO2 97%   BMI 26.84 kg/m  Wt Readings from Last 3 Encounters:  09/15/23 158 lb 12.8 oz (72 kg)  08/23/23 159 lb (72.1 kg)  08/16/23 155 lb (70.3 kg)    Physical Exam Vitals reviewed.  Constitutional:      General: She is not in acute distress.    Appearance: Normal appearance.  HENT:     Head: Normocephalic and atraumatic.     Right Ear: External ear normal.     Left Ear: External ear normal.     Mouth/Throat:     Pharynx: No oropharyngeal exudate or posterior oropharyngeal erythema.  Eyes:     General: No scleral icterus.       Right eye: No discharge.        Left eye: No discharge.     Conjunctiva/sclera: Conjunctivae normal.  Neck:     Thyroid: No thyromegaly.  Cardiovascular:     Rate and Rhythm: Normal rate and regular rhythm.  Pulmonary:     Effort: No respiratory distress.     Breath sounds: Normal breath sounds. No wheezing.  Abdominal:     General: Bowel sounds are normal.     Palpations: Abdomen is soft.     Tenderness: There is no abdominal tenderness.  Musculoskeletal:        General: No swelling.     Cervical back: Neck supple. No tenderness.     Comments: Erythema and tenderness tip of left great toe.    Lymphadenopathy:     Cervical: No cervical adenopathy.  Skin:    Findings: No erythema or rash.  Neurological:     Mental Status: She is alert.  Psychiatric:        Mood and Affect: Mood normal.        Behavior: Behavior normal.  Outpatient Encounter Medications as of 09/15/2023  Medication Sig   doxycycline (VIBRA-TABS) 100 MG tablet Take 1 tablet (100 mg total) by mouth 2 (two) times daily.   aspirin 81 MG chewable tablet Chew 81 mg by mouth daily.   azelastine (ASTELIN) 0.1 % nasal spray PLACE 1 SPRAY INTO BOTH NOSTRILS 2 (TWO) TIMES DAILY. USE IN EACH NOSTRIL AS DIRECTED   Biotin 5000 MCG TABS    busPIRone (BUSPAR) 7.5 MG tablet Take 1 tablet (7.5 mg total) by mouth 2 (two) times daily.   Calcium-Magnesium-Vitamin D (CALCIUM 500 PO) Take by mouth.   Cholecalciferol (VITAMIN D) 50 MCG (2000 UT) tablet Take 2,000 Units by mouth daily.   ciclopirox (LOPROX) 0.77 % cream Apply topically daily.   clobetasol cream (TEMOVATE) 0.05 % Apply to elbows twice a day as needed. Avoid face, groin, axilla.   Cobalamin Combinations (B-12) [phone] MCG SUBL daily.   Cranberry 400 MG CAPS Take by mouth.   fish oil-omega-3 fatty acids 1000 MG capsule Take 1 g by mouth daily.   gabapentin (NEURONTIN) 300 MG capsule Take 1 capsule (300 mg total) by mouth at bedtime.   Glucosamine-Chondroitin 1500-1200 MG/30ML LIQD    Magnesium 250 MG TABS    MISC NATURAL PRODUCTS PO Herbal Supplement   Multiple Vitamin (MULTIVITAMIN) tablet Take 1 tablet by mouth daily.   mupirocin ointment (BACTROBAN) 2 % Apply topically 3 (three) times daily. Left elbow   omeprazole (PRILOSEC) 20 MG capsule TAKE 1 CAPSULE BY MOUTH TWICE A DAY   Probiotic Product (PROBIOTIC PO) Take by mouth.   RYALTRIS 665-25 MCG/ACT SUSP INSTILL 2 SPRAYS IN EACH NOSTRIL TWICE DAILY   SANTYL 250 UNIT/GM ointment Apply 1 Application topically daily.   sildenafil (REVATIO) 20 MG tablet Take 20 mg by mouth 2 (two) times daily.    tacrolimus (PROTOPIC) 0.1 % ointment APPLY TO AFFECTED AREAS ONCE TO TWICE A DAY.   Tavaborole 5 % SOLN APPLY TO AFFECTED TOENAIL EVERY NIGHT UNTIL IMPROVED.   [DISCONTINUED] doxycycline (VIBRA-TABS) 100 MG tablet Take 1 tablet (100 mg total) by mouth 2 (two) times daily.   No facility-administered encounter medications on file as of 09/15/2023.     Lab Results  Component Value Date   WBC 4.9 09/11/2023   HGB 13.4 09/11/2023   HCT 40.1 09/11/2023   PLT 205.0 09/11/2023   GLUCOSE 95 09/11/2023   CHOL 199 09/11/2023   TRIG 145.0 09/11/2023   HDL 53.50 09/11/2023   LDLDIRECT 113.0 10/24/2022   LDLCALC 117 (H) 09/11/2023   ALT 12 09/11/2023   AST 17 09/11/2023   NA 141 09/11/2023   K 4.3 09/11/2023   CL 105 09/11/2023   CREATININE 0.82 09/11/2023   BUN 13 09/11/2023   CO2 28 09/11/2023   TSH 1.13 01/25/2023       Assessment & Plan:  Scleroderma (HCC) Assessment & Plan: Followed by rheumatology. Seeing Dr Allena Katz and Dr Clelia Croft [facility]). Recent echo ok. Finger tips ok. Toe erythema and tenderness as outlined. Use topical sildanafil and santyl ointment. Also, doxycycline - to cover for possible infection. Follow.    PAD (peripheral artery disease) (HCC) Assessment & Plan: Saw vascular 08/16/23 - noninvasive studies showed improvement. Recommended f/u in one year.    Hypercholesteremia Assessment & Plan: The 10-year ASCVD risk score (Arnett DK, et al., 2019) is: 2.8%   Values used to calculate the score:     Age: 66 years     Sex: Female     Is Non-Hispanic  African American: No     Diabetic: No     Tobacco smoker: No     Systolic Blood Pressure: 110 mmHg     Is BP treated: No     HDL Cholesterol: 53.5 mg/dL     Total Cholesterol: 199 mg/dL  Low cholesterol diet and exercise. Follow lipid panel.    History of colonic polyps Assessment & Plan: Colonoscopy 03/2021.    Anxiety Assessment & Plan: Continue buspar 7.5mg  bid. Now on gabapentin 300mg  q hs. Appears to be doing  better.  Follow.    Other orders -     Doxycycline Hyclate; Take 1 tablet (100 mg total) by mouth 2 (two) times daily.  Dispense: 14 tablet; Refill: 0     Dale Alasco, MD

## 2023-09-16 ENCOUNTER — Encounter: Payer: Self-pay | Admitting: Internal Medicine

## 2023-09-16 NOTE — Assessment & Plan Note (Signed)
 Continue buspar 7.5mg  bid. Now on gabapentin 300mg  q hs. Appears to be doing better.  Follow.

## 2023-09-16 NOTE — Assessment & Plan Note (Signed)
 The 10-year ASCVD risk score (Arnett DK, et al., 2019) is: 2.8%   Values used to calculate the score:     Age: 63 years     Sex: Female     Is Non-Hispanic African American: No     Diabetic: No     Tobacco smoker: No     Systolic Blood Pressure: 110 mmHg     Is BP treated: No     HDL Cholesterol: 53.5 mg/dL     Total Cholesterol: 199 mg/dL  Low cholesterol diet and exercise. Follow lipid panel.

## 2023-09-16 NOTE — Assessment & Plan Note (Signed)
Colonoscopy 03/2021.  

## 2023-09-16 NOTE — Assessment & Plan Note (Signed)
 Followed by rheumatology. Seeing Dr Allena Katz and Dr Clelia Croft [facility]). Recent echo ok. Finger tips ok. Toe erythema and tenderness as outlined. Use topical sildanafil and santyl ointment. Also, doxycycline - to cover for possible infection. Follow.

## 2023-09-16 NOTE — Assessment & Plan Note (Signed)
 Saw vascular 08/16/23 - noninvasive studies showed improvement. Recommended f/u in one year.

## 2023-10-02 ENCOUNTER — Ambulatory Visit (INDEPENDENT_AMBULATORY_CARE_PROVIDER_SITE_OTHER): Payer: BC Managed Care – PPO | Admitting: Internal Medicine

## 2023-10-02 VITALS — BP 120/76 | HR 66 | Temp 97.8°F | Ht 64.5 in | Wt 157.8 lb

## 2023-10-02 DIAGNOSIS — Z1231 Encounter for screening mammogram for malignant neoplasm of breast: Secondary | ICD-10-CM | POA: Diagnosis not present

## 2023-10-02 DIAGNOSIS — F419 Anxiety disorder, unspecified: Secondary | ICD-10-CM

## 2023-10-02 DIAGNOSIS — M349 Systemic sclerosis, unspecified: Secondary | ICD-10-CM

## 2023-10-02 DIAGNOSIS — I739 Peripheral vascular disease, unspecified: Secondary | ICD-10-CM

## 2023-10-02 DIAGNOSIS — Z0000 Encounter for general adult medical examination without abnormal findings: Secondary | ICD-10-CM

## 2023-10-02 DIAGNOSIS — E7800 Pure hypercholesterolemia, unspecified: Secondary | ICD-10-CM

## 2023-10-02 DIAGNOSIS — K2100 Gastro-esophageal reflux disease with esophagitis, without bleeding: Secondary | ICD-10-CM

## 2023-10-02 MED ORDER — OMEPRAZOLE 20 MG PO CPDR: 20.0000 mg | DELAYED_RELEASE_CAPSULE | Freq: Two times a day (BID) | ORAL | 1 refills | Status: DC

## 2023-10-02 MED ORDER — BUSPIRONE HCL 7.5 MG PO TABS: 7.5000 mg | ORAL_TABLET | Freq: Two times a day (BID) | ORAL | 1 refills | Status: DC

## 2023-10-02 NOTE — Assessment & Plan Note (Signed)
 Physical today 10/02/23.  PAP 06/17/22 - negative with negative HPV. Mammogram 02/03/23 - Birads I.  Colonoscopy 03/2021.  Recommended f/u in 10 years.

## 2023-10-02 NOTE — Progress Notes (Signed)
 Subjective:    Patient ID: Denise Arnold, female    DOB: 1960-09-18, 63 y.o.   MRN: [id number]  Patient here for  Chief Complaint  Patient presents with   Annual Exam    HPI Here for a physical exam. Treated with doxycycline recent visit 09/15/23 - redness - left great toe. Toe is better. No increased pain. Does report that over the past few days, she has noticed increased drainage. Taking zyrtec. Discussed flonase and saline nasal spray. Some right ear fullness. No chest pain or sob reported. No increased chest congestion. No abdominal pain or bowel change reported.    Past Medical History:  Diagnosis Date   Allergy ?   Penicillin, sulpha drugs, Paxlovid,   Arthritis    GERD (gastroesophageal reflux disease)    History of endoscopy 04/05/2013   upper   Menorrhagia    Reynolds syndrome (HCC)    Scleroderma (HCC)    positive FANA, positive SCL-70 abs, raynauds, mild pulmonary hypertension, normal DLCO   Tricuspid regurgitation    Vitamin D deficiency    Past Surgical History:  Procedure Laterality Date   CESAREAN SECTION  1994   COLONOSCOPY WITH PROPOFOL N/A 05/29/2015   Procedure: COLONOSCOPY WITH PROPOFOL;  Surgeon: Scot Jun, MD;  Location: [facility] ENDOSCOPY;  Service: Endoscopy;  Laterality: N/A;   DILATION AND CURETTAGE OF UTERUS  1992   DILATION AND CURETTAGE OF UTERUS  1993   DILATION AND CURETTAGE OF UTERUS  2011   VEIN SURGERY     Family History  Problem Relation Age of Onset   Heart disease Father        myocardial infarction   Arthritis/Rheumatoid Father    Hypertension Father    Arthritis Father    COPD Father    Hyperlipidemia Mother    Breast cancer Maternal Grandmother    Cancer Maternal Grandmother    Colon cancer Neg Hx    Social History   Socioeconomic History   Marital status: Married    Spouse name: Not on file   Number of children: 1   Years of education: Not on file   Highest education level: Bachelor's degree (e.g., BA, AB, BS)   Occupational History   Not on file  Tobacco Use   Smoking status: Never   Smokeless tobacco: Never  Vaping Use   Vaping status: Never Used  Substance and Sexual Activity   Alcohol use: Yes    Comment: Occasionally   Drug use: No   Sexual activity: Yes    Birth control/protection: Condom  Other Topics Concern   Not on file  Social History Narrative   Not on file   Social Drivers of Health   Financial Resource Strain: Low Risk  (10/02/2023)   Overall Financial Resource Strain (CARDIA)    Difficulty of Paying Living Expenses: Not hard at all  Food Insecurity: No Food Insecurity (10/02/2023)   Hunger Vital Sign    Worried About Running Out of Food in the Last Year: Never true    Ran Out of Food in the Last Year: Never true  Transportation Needs: No Transportation Needs (10/02/2023)   PRAPARE - Administrator, Civil Service (Medical): No    Lack of Transportation (Non-Medical): No  Physical Activity: Sufficiently Active (10/02/2023)   Exercise Vital Sign    Days of Exercise per Week: 3 days    Minutes of Exercise per Session: 50 min  Stress: No Stress Concern Present (10/02/2023)   [employer]  of Occupational Health - Occupational Stress Questionnaire    Feeling of Stress : Only a little  Social Connections: Socially Integrated (10/02/2023)   Social Connection and Isolation Panel [NHANES]    Frequency of Communication with Friends and Family: More than three times a week    Frequency of Social Gatherings with Friends and Family: Three times a week    Attends Religious Services: More than 4 times per year    Active Member of Clubs or Organizations: Yes    Attends Banker Meetings: 1 to 4 times per year    Marital Status: Married     Review of Systems  Constitutional:  Negative for appetite change, fever and unexpected weight change.  HENT:  Positive for congestion and postnasal drip. Negative for sinus pressure and sore throat.   Eyes:  Negative  for pain and visual disturbance.  Respiratory:  Negative for cough, chest tightness and shortness of breath.   Cardiovascular:  Negative for chest pain, palpitations and leg swelling.  Gastrointestinal:  Negative for abdominal pain, diarrhea, nausea and vomiting.  Genitourinary:  Negative for difficulty urinating and dysuria.  Musculoskeletal:  Negative for joint swelling and myalgias.  Skin:  Negative for color change and rash.  Neurological:  Negative for dizziness and headaches.  Hematological:  Negative for adenopathy. Does not bruise/bleed easily.  Psychiatric/Behavioral:  Negative for agitation and dysphoric mood.        Objective:     BP 120/76   Pulse 66   Temp 97.8 F (36.6 C) (Oral)   Ht 5' 4.5" (1.638 m)   Wt 157 lb 12.8 oz (71.6 kg)   LMP 02/14/2010   SpO2 98%   BMI 26.67 kg/m  Wt Readings from Last 3 Encounters:  10/02/23 157 lb 12.8 oz (71.6 kg)  09/15/23 158 lb 12.8 oz (72 kg)  08/23/23 159 lb (72.1 kg)    Physical Exam Vitals reviewed.  Constitutional:      General: She is not in acute distress.    Appearance: Normal appearance. She is well-developed.  HENT:     Head: Normocephalic and atraumatic.     Right Ear: External ear normal.     Left Ear: External ear normal.     Mouth/Throat:     Pharynx: No oropharyngeal exudate or posterior oropharyngeal erythema.  Eyes:     General: No scleral icterus.       Right eye: No discharge.        Left eye: No discharge.     Conjunctiva/sclera: Conjunctivae normal.  Neck:     Thyroid: No thyromegaly.  Cardiovascular:     Rate and Rhythm: Normal rate and regular rhythm.  Pulmonary:     Effort: No tachypnea, accessory muscle usage or respiratory distress.     Breath sounds: Normal breath sounds. No decreased breath sounds or wheezing.  Chest:  Breasts:    Right: No inverted nipple, mass, nipple discharge or tenderness (no axillary adenopathy).     Left: No inverted nipple, mass, nipple discharge or  tenderness (no axilarry adenopathy).  Abdominal:     General: Bowel sounds are normal.     Palpations: Abdomen is soft.     Tenderness: There is no abdominal tenderness.  Musculoskeletal:        General: No swelling or tenderness.     Cervical back: Neck supple.  Lymphadenopathy:     Cervical: No cervical adenopathy.  Skin:    Findings: No erythema or rash.  Neurological:  Mental Status: She is alert and oriented to person, place, and time.  Psychiatric:        Mood and Affect: Mood normal.        Behavior: Behavior normal.         Outpatient Encounter Medications as of 10/02/2023  Medication Sig   aspirin 81 MG chewable tablet Chew 81 mg by mouth daily.   azelastine (ASTELIN) 0.1 % nasal spray PLACE 1 SPRAY INTO BOTH NOSTRILS 2 (TWO) TIMES DAILY. USE IN EACH NOSTRIL AS DIRECTED   Biotin 5000 MCG TABS    busPIRone (BUSPAR) 7.5 MG tablet Take 1 tablet (7.5 mg total) by mouth 2 (two) times daily.   Calcium-Magnesium-Vitamin D (CALCIUM 500 PO) Take by mouth.   Cholecalciferol (VITAMIN D) 50 MCG (2000 UT) tablet Take 2,000 Units by mouth daily.   ciclopirox (LOPROX) 0.77 % cream Apply topically daily.   clobetasol cream (TEMOVATE) 0.05 % Apply to elbows twice a day as needed. Avoid face, groin, axilla.   Cobalamin Combinations (B-12) [phone] MCG SUBL daily.   Cranberry 400 MG CAPS Take by mouth.   doxycycline (VIBRA-TABS) 100 MG tablet Take 1 tablet (100 mg total) by mouth 2 (two) times daily.   fish oil-omega-3 fatty acids 1000 MG capsule Take 1 g by mouth daily.   gabapentin (NEURONTIN) 300 MG capsule Take 1 capsule (300 mg total) by mouth at bedtime.   Glucosamine-Chondroitin 1500-1200 MG/30ML LIQD    Magnesium 250 MG TABS    MISC NATURAL PRODUCTS PO Herbal Supplement   Multiple Vitamin (MULTIVITAMIN) tablet Take 1 tablet by mouth daily.   mupirocin ointment (BACTROBAN) 2 % Apply topically 3 (three) times daily. Left elbow   omeprazole (PRILOSEC) 20 MG capsule Take 1  capsule (20 mg total) by mouth 2 (two) times daily.   Probiotic Product (PROBIOTIC PO) Take by mouth.   RYALTRIS 665-25 MCG/ACT SUSP INSTILL 2 SPRAYS IN EACH NOSTRIL TWICE DAILY   SANTYL 250 UNIT/GM ointment Apply 1 Application topically daily.   sildenafil (REVATIO) 20 MG tablet Take 20 mg by mouth 2 (two) times daily.   tacrolimus (PROTOPIC) 0.1 % ointment APPLY TO AFFECTED AREAS ONCE TO TWICE A DAY.   Tavaborole 5 % SOLN APPLY TO AFFECTED TOENAIL EVERY NIGHT UNTIL IMPROVED.   [DISCONTINUED] busPIRone (BUSPAR) 7.5 MG tablet Take 1 tablet (7.5 mg total) by mouth 2 (two) times daily.   [DISCONTINUED] omeprazole (PRILOSEC) 20 MG capsule TAKE 1 CAPSULE BY MOUTH TWICE A DAY   No facility-administered encounter medications on file as of 10/02/2023.     Lab Results  Component Value Date   WBC 4.9 09/11/2023   HGB 13.4 09/11/2023   HCT 40.1 09/11/2023   PLT 205.0 09/11/2023   GLUCOSE 95 09/11/2023   CHOL 199 09/11/2023   TRIG 145.0 09/11/2023   HDL 53.50 09/11/2023   LDLDIRECT 113.0 10/24/2022   LDLCALC 117 (H) 09/11/2023   ALT 12 09/11/2023   AST 17 09/11/2023   NA 141 09/11/2023   K 4.3 09/11/2023   CL 105 09/11/2023   CREATININE 0.82 09/11/2023   BUN 13 09/11/2023   CO2 28 09/11/2023   TSH 1.13 01/25/2023       Assessment & Plan:  Health care maintenance Assessment & Plan: Physical today 10/02/23.  PAP 06/17/22 - negative with negative HPV. Mammogram 02/03/23 - Birads I.  Colonoscopy 03/2021.  Recommended f/u in 10 years.    Systemic sclerosis, unspecified (HCC) -     Omeprazole; Take 1 capsule (20  mg total) by mouth 2 (two) times daily.  Dispense: 180 capsule; Refill: 1  Visit for screening mammogram -     3D Screening Mammogram, Left and Right; Future  Anxiety Assessment & Plan: Continue buspar 7.5mg  bid. Now on gabapentin 300mg  q hs. Appears to be doing better.  Follow. No changes today.    Gastroesophageal reflux disease with esophagitis without  hemorrhage Assessment & Plan: No acid reflux reported.  Prilosec. No upper symptoms reported.    Hypercholesteremia Assessment & Plan: The 10-year ASCVD risk score (Arnett DK, et al., 2019) is: 3.3%   Values used to calculate the score:     Age: 51 years     Sex: Female     Is Non-Hispanic African American: No     Diabetic: No     Tobacco smoker: No     Systolic Blood Pressure: 120 mmHg     Is BP treated: No     HDL Cholesterol: 53.5 mg/dL     Total Cholesterol: 199 mg/dL  Low cholesterol diet and exercise. Follow lipid panel.    PAD (peripheral artery disease) (HCC) Assessment & Plan: Saw vascular 08/16/23 - noninvasive studies showed improvement. Recommended f/u in one year.    Scleroderma (HCC) Assessment & Plan: Followed by rheumatology. Seeing Dr Allena Katz and Dr Clelia Croft [facility]). Recent echo ok. Finger tips ok. Toe erythema and tenderness - last visit. She has been using topical sildanafil and santyl ointment. Also, doxycycline - to cover for possible infection. No increased pain. Persistent erythema. Hold on topical ointments. Call with udpate over the next few days. Feel the steroid may be irritating. Follow closely.    Other orders -     busPIRone HCl; Take 1 tablet (7.5 mg total) by mouth 2 (two) times daily.  Dispense: 180 tablet; Refill: 1     Dale [place] [address], MD

## 2023-10-08 ENCOUNTER — Encounter: Payer: Self-pay | Admitting: Internal Medicine

## 2023-10-08 NOTE — Assessment & Plan Note (Signed)
 Continue buspar 7.5mg  bid. Now on gabapentin 300mg  q hs. Appears to be doing better.  Follow. No changes today.

## 2023-10-08 NOTE — Assessment & Plan Note (Signed)
 Followed by rheumatology. Seeing Dr Allena Katz and Dr Clelia Croft [facility]). Recent echo ok. Finger tips ok. Toe erythema and tenderness - last visit. She has been using topical sildanafil and santyl ointment. Also, doxycycline - to cover for possible infection. No increased pain. Persistent erythema. Hold on topical ointments. Call with udpate over the next few days. Feel the steroid may be irritating. Follow closely.

## 2023-10-08 NOTE — Assessment & Plan Note (Signed)
 The 10-year ASCVD risk score (Arnett DK, et al., 2019) is: 3.3%   Values used to calculate the score:     Age: 63 years     Sex: Female     Is Non-Hispanic African American: No     Diabetic: No     Tobacco smoker: No     Systolic Blood Pressure: 120 mmHg     Is BP treated: No     HDL Cholesterol: 53.5 mg/dL     Total Cholesterol: 199 mg/dL  Low cholesterol diet and exercise. Follow lipid panel.

## 2023-10-08 NOTE — Assessment & Plan Note (Signed)
 Saw vascular 08/16/23 - noninvasive studies showed improvement. Recommended f/u in one year.

## 2023-10-08 NOTE — Assessment & Plan Note (Signed)
 No acid reflux reported.  Prilosec. No upper symptoms reported.

## 2023-11-16 ENCOUNTER — Encounter: Payer: Self-pay | Admitting: Internal Medicine

## 2023-11-16 NOTE — Telephone Encounter (Signed)
 Called patient to schedule sooner appt with Dr Geralyn Knee. She did not want to wait until early next week to be seen. Requested to see another provider to discuss changing her acid reflux meds and determine if another swallow study needs to be done.

## 2023-11-17 ENCOUNTER — Ambulatory Visit

## 2023-11-17 VITALS — BP 120/80 | HR 89 | Temp 98.2°F | Ht 64.0 in | Wt 157.6 lb

## 2023-11-17 DIAGNOSIS — K317 Polyp of stomach and duodenum: Secondary | ICD-10-CM | POA: Diagnosis not present

## 2023-11-17 DIAGNOSIS — K449 Diaphragmatic hernia without obstruction or gangrene: Secondary | ICD-10-CM | POA: Insufficient documentation

## 2023-11-17 DIAGNOSIS — M349 Systemic sclerosis, unspecified: Secondary | ICD-10-CM | POA: Diagnosis not present

## 2023-11-17 DIAGNOSIS — R1319 Other dysphagia: Secondary | ICD-10-CM | POA: Diagnosis not present

## 2023-11-17 MED ORDER — PANTOPRAZOLE SODIUM 40 MG PO TBEC: 40.0000 mg | DELAYED_RELEASE_TABLET | Freq: Every day | ORAL | 0 refills | Status: DC

## 2023-11-17 MED ORDER — FAMOTIDINE 20 MG PO TABS: 20.0000 mg | ORAL_TABLET | Freq: Every day | ORAL | 0 refills | Status: AC

## 2023-11-17 NOTE — Assessment & Plan Note (Signed)
 Evident on EGD from 03/23/21. Plan per dysphagia.

## 2023-11-17 NOTE — Progress Notes (Signed)
 Acute Office Visit  Subjective:    Patient ID: Denise Arnold, female    DOB: 1960-10-27, 63 y.o.   MRN: [id number]  Chief Complaint  Patient presents with   Gastroesophageal Reflux    HPI Patient with h/o scleroderma, sees [facility] rheumatology (last appointment on 11/15/23) presents for evaluation of sensation of "pill stuck like sensation" for 3 days. Patient reports she has been burping more than usual for the same duration. Patient takes Omeprazole  20 mg twice a day but has not helped in resolving/improving symptoms this time around. Patient reports she has been eating less due to current episode but denies bloating, nausea, vomiting, diarrhea, constipation, stool incontinence, fever, blood in stool/change in color of stool. She denies unintentional weight loss.   Patient reports she took Aleve within the last week for headache.   Patient sees scleroderma specialist Dr. Sheilla Der with [facility] once a year and is seeing rheumatologist Dr. Lydia [facility] 6 months.   Patient had EGD on 03/23/2021 which showed her to have 1 cm hiatal hernia, 2 gastric polyps.   ROS As per HPI    Objective:    BP 120/80   Pulse 89   Temp 98.2 F (36.8 C) (Oral)   Ht 5\' 4"  (1.626 m)   Wt 157 lb 9.6 oz (71.5 kg)   LMP 02/14/2010   SpO2 98%   BMI 27.05 kg/m    Physical Exam Constitutional:      Appearance: Normal appearance.  HENT:     Head: Normocephalic and atraumatic.     Right Ear: Tympanic membrane normal.     Left Ear: Tympanic membrane normal.     Nose: No rhinorrhea.     Mouth/Throat:     Mouth: Mucous membranes are moist.  Neck:     Thyroid : No thyroid  mass or thyroid  tenderness.  Cardiovascular:     Rate and Rhythm: Normal rate and regular rhythm.  Pulmonary:     Effort: Pulmonary effort is normal.     Breath sounds: Normal breath sounds.  Abdominal:     General: Bowel sounds are normal. There is no distension.     Palpations: Abdomen is soft.      Tenderness: There is no abdominal tenderness. There is no guarding or rebound.  Musculoskeletal:     Cervical back: Neck supple. No rigidity.     Right lower leg: No edema.     Left lower leg: No edema.  Skin:    General: Skin is warm.  Neurological:     Mental Status: She is alert and oriented to person, place, and time.  Psychiatric:        Mood and Affect: Mood normal.        Behavior: Behavior normal.    I reviewed EGD result from 03/23/2021:  1 cm hiatal hernia 2 sessile gastric polyps.   No results found for any visits on 11/17/23.     Assessment & Plan:  Pleasant 63 year old female presenting for evaluation of dysphagia for 3 days. If red flag symptoms like vomiting, bloating, fever, abdominal pain, black tarry stool I recommend emergent evaluation.   Other dysphagia Assessment & Plan: D/D includes: inflammatory/scleroderma-related dysphagia, GERD, hiatal hernia, esophageal stricture, less likely malignant causes given no red flag symptoms.   Recommend the following: Stop Omeprazole , start Pantoprazole 40 mg in empty stomach, first thing in the morning for better absorption.   Take Pepcid  20 mg at bedtime for breakthrough symptoms.  Elevate head of bed, avoid late meals, avoid trigger food and NSAIDs like Ibuprofen, Aleeve, Advil.   GI referral for repeat EGD, I believe given patient's h/o scleroderma, chronic PPI use, new dysphagia she will benefit from annual GI follow up. Patient agreeable, urgent referral made today.   Orders: -     Pantoprazole Sodium; Take 1 tablet (40 mg total) by mouth daily.  Dispense: 60 tablet; Refill: 0 -     Ambulatory referral to Gastroenterology -     Famotidine ; Take 1 tablet (20 mg total) by mouth at bedtime.  Dispense: 30 tablet; Refill: 0  Hiatal hernia Assessment & Plan: Evident on EGD from 03/23/21. Plan per dysphagia.    Gastric polyps Assessment & Plan: Evident on EGD from 03/23/21. Plan per dysphagia.    Scleroderma  (HCC) Assessment & Plan: Continue f/u with rheumatology and scleroderma clinic. Current episode may be related to her diagnosis of scleroderma. Plan per dysphagia.    I spent 35 minutes on the day of this face to face encounter reviewing patient's visit with rheumatology, scleroderma clinic,  prior relevant surgical and non surgical procedures, recent  and imaging studies including EGD, counseling on dysphagia, avoiding triggers, red flag symptoms,  reviewing the assessment and plan with patient,  referral to GI and post visit ordering and reviewing of  diagnostics and therapeutics with patient .   Jacklin Mascot, MD   Return if symptoms worsen or fail to improve.  Jacklin Mascot, MD

## 2023-11-17 NOTE — Patient Instructions (Addendum)
 Elevate head of bed, avoid late meals, avoid trigger food and NSAIDs like Ibuprofen, Aleeve, Advil.   Take Pantoprazole 40 mg once a day first thing in the morning, 30 min before you eat anything.   Take Pepcid  20 mg, at bedtime for breakthrough symptoms when Pantoprazole daily is not helping.    GI referral will be done to [facility]. If you don't hear from them within one week please let us  know.

## 2023-11-17 NOTE — Assessment & Plan Note (Signed)
 Continue f/u with rheumatology and scleroderma clinic. Current episode may be related to her diagnosis of scleroderma. Plan per dysphagia.

## 2023-11-17 NOTE — Assessment & Plan Note (Signed)
 D/D includes: inflammatory/scleroderma-related dysphagia, GERD, hiatal hernia, esophageal stricture, less likely malignant causes given no red flag symptoms.   Recommend the following: Stop Omeprazole , start Pantoprazole 40 mg in empty stomach, first thing in the morning for better absorption.   Take Pepcid  20 mg at bedtime for breakthrough symptoms.   Elevate head of bed, avoid late meals, avoid trigger food and NSAIDs like Ibuprofen, Aleeve, Advil.   GI referral for repeat EGD, I believe given patient's h/o scleroderma, chronic PPI use, new dysphagia she will benefit from annual GI follow up. Patient agreeable, urgent referral made today.

## 2023-11-27 ENCOUNTER — Ambulatory Visit: Admitting: Internal Medicine

## 2023-11-27 ENCOUNTER — Encounter: Payer: Self-pay | Admitting: Internal Medicine

## 2023-11-27 VITALS — BP 114/74 | HR 88 | Temp 98.0°F | Resp 16 | Ht 64.5 in | Wt 157.0 lb

## 2023-11-27 DIAGNOSIS — K2100 Gastro-esophageal reflux disease with esophagitis, without bleeding: Secondary | ICD-10-CM

## 2023-11-27 DIAGNOSIS — L98491 Non-pressure chronic ulcer of skin of other sites limited to breakdown of skin: Secondary | ICD-10-CM | POA: Diagnosis not present

## 2023-11-27 DIAGNOSIS — F419 Anxiety disorder, unspecified: Secondary | ICD-10-CM | POA: Diagnosis not present

## 2023-11-27 DIAGNOSIS — E7800 Pure hypercholesterolemia, unspecified: Secondary | ICD-10-CM

## 2023-11-27 DIAGNOSIS — R1319 Other dysphagia: Secondary | ICD-10-CM | POA: Diagnosis not present

## 2023-11-27 DIAGNOSIS — M349 Systemic sclerosis, unspecified: Secondary | ICD-10-CM

## 2023-11-27 MED ORDER — PANTOPRAZOLE SODIUM 40 MG PO TBEC
40.0000 mg | DELAYED_RELEASE_TABLET | Freq: Two times a day (BID) | ORAL | 2 refills | Status: DC
Start: 2023-11-27 — End: 2024-04-25

## 2023-11-27 NOTE — Progress Notes (Signed)
 Subjective:    Patient ID: Denise Arnold, female    DOB: 01/08/1961, 63 y.o.   MRN: [id number]  Patient here for  Chief Complaint  Patient presents with   Medical Management of Chronic Issues    Acid reflux.     HPI Here for a work in appt - work in to discuss recent concerns regarding feeling as if food is sticking in her throat. Saw Dr Casimir Cleaver 11/17/23. Also reported some increased burping. Eating less due to the above symptoms. EGD 03/2021 - 1 cm hiatal hernia with two gastric polyps. She had been on omeprazole  20mg  bid. Was instructed during the 11/17/23 visit to start protonix  40mg  q day and pepcid  in the evening for break through symptoms. Referred to GI for further evaluation. Since changing to protonix , the dysphagia is no longer an issue, but she is now having increased acid reflux. Will noticed increased acid reflux with bending over. Discussed avoiding foods that aggravate and avoid eating late. No vomiting. Bowels stable. No abdominal pain.    Past Medical History:  Diagnosis Date   Allergy ?   Penicillin, sulpha drugs, Paxlovid,   Arthritis    GERD (gastroesophageal reflux disease)    History of endoscopy 04/05/2013   upper   Menorrhagia    Reynolds syndrome (HCC)    Scleroderma (HCC)    positive FANA, positive SCL-70 abs, raynauds, mild pulmonary hypertension, normal DLCO   Tricuspid regurgitation    Vitamin D  deficiency    Past Surgical History:  Procedure Laterality Date   CESAREAN SECTION  1994   COLONOSCOPY WITH PROPOFOL  N/A 05/29/2015   Procedure: COLONOSCOPY WITH PROPOFOL ;  Surgeon: Cassie Click, MD;  Location: [facility] ENDOSCOPY;  Service: Endoscopy;  Laterality: N/A;   DILATION AND CURETTAGE OF UTERUS  1992   DILATION AND CURETTAGE OF UTERUS  1993   DILATION AND CURETTAGE OF UTERUS  2011   VEIN SURGERY     Family History  Problem Relation Age of Onset   Heart disease Father        myocardial infarction   Arthritis/Rheumatoid Father    Hypertension Father     Arthritis Father    COPD Father    Hyperlipidemia Mother    Breast cancer Maternal Grandmother    Cancer Maternal Grandmother    Colon cancer Neg Hx    Social History   Socioeconomic History   Marital status: Married    Spouse name: Not on file   Number of children: 1   Years of education: Not on file   Highest education level: Bachelor's degree (e.g., BA, AB, BS)  Occupational History   Not on file  Tobacco Use   Smoking status: Never   Smokeless tobacco: Never  Vaping Use   Vaping status: Never Used  Substance and Sexual Activity   Alcohol use: Yes    Comment: Occasionally   Drug use: No   Sexual activity: Yes    Birth control/protection: Condom  Other Topics Concern   Not on file  Social History Narrative   Not on file   Social Drivers of Health   Financial Resource Strain: Low Risk  (11/01/2023)   Received from [facility]   Overall Financial Resource Strain (CARDIA)    Difficulty of Paying Living Expenses: Not hard at all  Food Insecurity: No Food Insecurity (11/01/2023)   Received from [facility]   Hunger Vital Sign    Worried About Running Out of Food in the  Last Year: Never true    Ran Out of Food in the Last Year: Never true  Transportation Needs: No Transportation Needs (11/01/2023)   Received from [facility] - Transportation    In the past 12 months, has lack of transportation kept you from medical appointments or from getting medications?: No    Lack of Transportation (Non-Medical): No  Physical Activity: Sufficiently Active (10/02/2023)   Exercise Vital Sign    Days of Exercise per Week: 3 days    Minutes of Exercise per Session: 50 min  Stress: No Stress Concern Present (10/02/2023)   [employer] of Occupational Health - Occupational Stress Questionnaire    Feeling of Stress : Only a little  Social Connections: Socially Integrated (10/02/2023)   Social Connection and Isolation  Panel [NHANES]    Frequency of Communication with Friends and Family: More than three times a week    Frequency of Social Gatherings with Friends and Family: Three times a week    Attends Religious Services: More than 4 times per year    Active Member of Clubs or Organizations: Yes    Attends Banker Meetings: 1 to 4 times per year    Marital Status: Married     Review of Systems  Constitutional:  Negative for appetite change and unexpected weight change.  HENT:  Negative for congestion and sinus pressure.   Respiratory:  Negative for cough, chest tightness and shortness of breath.   Cardiovascular:  Negative for chest pain, palpitations and leg swelling.  Gastrointestinal:  Negative for diarrhea, nausea and vomiting.       Issues with swallowing and acid reflux as outlined.   Genitourinary:  Negative for difficulty urinating and dysuria.  Musculoskeletal:  Negative for joint swelling and myalgias.  Skin:  Negative for color change and rash.  Neurological:  Negative for dizziness and headaches.  Psychiatric/Behavioral:  Negative for agitation and dysphoric mood.        Objective:     BP 114/74   Pulse 88   Temp 98 F (36.7 C)   Resp 16   Ht 5' 4.5" (1.638 m)   Wt 157 lb (71.2 kg)   LMP 02/14/2010   SpO2 97%   BMI 26.53 kg/m  Wt Readings from Last 3 Encounters:  11/27/23 157 lb (71.2 kg)  11/17/23 157 lb 9.6 oz (71.5 kg)  10/02/23 157 lb 12.8 oz (71.6 kg)    Physical Exam Vitals reviewed.  Constitutional:      General: She is not in acute distress.    Appearance: Normal appearance.  HENT:     Head: Normocephalic and atraumatic.     Right Ear: External ear normal.     Left Ear: External ear normal.     Mouth/Throat:     Pharynx: No oropharyngeal exudate or posterior oropharyngeal erythema.  Eyes:     General: No scleral icterus.       Right eye: No discharge.        Left eye: No discharge.     Conjunctiva/sclera: Conjunctivae normal.  Neck:      Thyroid : No thyromegaly.  Cardiovascular:     Rate and Rhythm: Normal rate and regular rhythm.  Pulmonary:     Effort: No respiratory distress.     Breath sounds: Normal breath sounds. No wheezing.  Abdominal:     General: Bowel sounds are normal.     Palpations: Abdomen is soft.     Tenderness: There is  no abdominal tenderness.  Musculoskeletal:        General: No swelling or tenderness.     Cervical back: Neck supple. No tenderness.  Lymphadenopathy:     Cervical: No cervical adenopathy.  Skin:    Findings: No erythema or rash.  Neurological:     Mental Status: She is alert.  Psychiatric:        Mood and Affect: Mood normal.        Behavior: Behavior normal.         Outpatient Encounter Medications as of 11/27/2023  Medication Sig   aspirin 81 MG chewable tablet Chew 81 mg by mouth daily.   azelastine  (ASTELIN ) 0.1 % nasal spray PLACE 1 SPRAY INTO BOTH NOSTRILS 2 (TWO) TIMES DAILY. USE IN EACH NOSTRIL AS DIRECTED   Biotin 5000 MCG TABS    busPIRone  (BUSPAR ) 7.5 MG tablet Take 1 tablet (7.5 mg total) by mouth 2 (two) times daily.   Calcium-Magnesium-Vitamin D  (CALCIUM 500 PO) Take by mouth.   Cholecalciferol (VITAMIN D ) 50 MCG (2000 UT) tablet Take 2,000 Units by mouth daily.   ciclopirox  (LOPROX ) 0.77 % cream Apply topically daily.   clobetasol  cream (TEMOVATE ) 0.05 % Apply to elbows twice a day as needed. Avoid face, groin, axilla.   Cobalamin Combinations (B-12) [phone] MCG SUBL daily.   Cranberry 400 MG CAPS Take by mouth.   doxycycline  (VIBRA -TABS) 100 MG tablet Take 1 tablet (100 mg total) by mouth 2 (two) times daily.   famotidine  (PEPCID ) 20 MG tablet Take 1 tablet (20 mg total) by mouth at bedtime.   fish oil-omega-3 fatty acids 1000 MG capsule Take 1 g by mouth daily.   Glucosamine-Chondroitin 1500-1200 MG/30ML LIQD    Magnesium 250 MG TABS    MISC NATURAL PRODUCTS PO Herbal Supplement   Multiple Vitamin (MULTIVITAMIN) tablet Take 1 tablet by mouth daily.    mupirocin  ointment (BACTROBAN ) 2 % Apply topically 3 (three) times daily. Left elbow   Probiotic Product (PROBIOTIC PO) Take by mouth.   RYALTRIS 665-25 MCG/ACT SUSP INSTILL 2 SPRAYS IN EACH NOSTRIL TWICE DAILY   SANTYL 250 UNIT/GM ointment Apply 1 Application topically daily.   sildenafil (REVATIO) 20 MG tablet Take 20 mg by mouth 2 (two) times daily.   tacrolimus  (PROTOPIC ) 0.1 % ointment APPLY TO AFFECTED AREAS ONCE TO TWICE A DAY.   Tavaborole  5 % SOLN APPLY TO AFFECTED TOENAIL EVERY NIGHT UNTIL IMPROVED.   [DISCONTINUED] gabapentin  (NEURONTIN ) 300 MG capsule Take 1 capsule (300 mg total) by mouth at bedtime.   [DISCONTINUED] pantoprazole  (PROTONIX ) 40 MG tablet Take 1 tablet (40 mg total) by mouth daily.   pantoprazole  (PROTONIX ) 40 MG tablet Take 1 tablet (40 mg total) by mouth 2 (two) times daily before a meal.   No facility-administered encounter medications on file as of 11/27/2023.     Lab Results  Component Value Date   WBC 4.9 09/11/2023   HGB 13.4 09/11/2023   HCT 40.1 09/11/2023   PLT 205.0 09/11/2023   GLUCOSE 95 09/11/2023   CHOL 199 09/11/2023   TRIG 145.0 09/11/2023   HDL 53.50 09/11/2023   LDLDIRECT 113.0 10/24/2022   LDLCALC 117 (H) 09/11/2023   ALT 12 09/11/2023   AST 17 09/11/2023   NA 141 09/11/2023   K 4.3 09/11/2023   CL 105 09/11/2023   CREATININE 0.82 09/11/2023   BUN 13 09/11/2023   CO2 28 09/11/2023   TSH 1.13 01/25/2023       Assessment & Plan:  Anxiety Assessment & Plan: Continue buspar  7.5mg  bid. Continue gabapentin  300mg  q hs. Her current medical issues have caused some increased anxiety. Overall doing ok. Hold on changing medication. Follow.    Other dysphagia Assessment & Plan: Recent problems with dysphagia. Has a history of scleroderma. Recently evaluated and Prilosec changed to protonix . Swallowing has improved. Now has increased acid reflux.  Currently taking protonix  40mg  in the am. Will increase to 40mg  bid and take pepcid  before  bed. Discussed GI evaluation. Will see if can arrange earlier f/u appt with GI for further evaluation. Avoid foods that aggravate. Avoid eating late. Elevate head of bed.   Orders: -     Pantoprazole  Sodium; Take 1 tablet (40 mg total) by mouth 2 (two) times daily before a meal.  Dispense: 60 tablet; Refill: 2  Skin ulcer of finger, limited to breakdown of skin [facility]) Assessment & Plan: Has seen dermatology and rheumatology.  Fingers improved. Follow    Gastroesophageal reflux disease with esophagitis without hemorrhage Assessment & Plan: No upper symptoms reported. Continue prilosec.    Hypercholesteremia Assessment & Plan: The 10-year ASCVD risk score (Arnett DK, et al., 2019) is: 3.2%   Values used to calculate the score:     Age: 72 years     Sex: Female     Is Non-Hispanic African American: No     Diabetic: No     Tobacco smoker: No     Systolic Blood Pressure: 118 mmHg     Is BP treated: No     HDL Cholesterol: 53.5 mg/dL     Total Cholesterol: 199 mg/dL  Low cholesterol diet and exercise. Follow lipid panel.    Scleroderma (HCC) Assessment & Plan: Followed by rheumatology. Seeing Dr Lydia Sams and Dr Bernetta Brilliant [facility]). Recent echo ok. Finger tips ok. Referral to GI for further evaluation for swallowing.       Dellar Fenton, MD

## 2023-11-27 NOTE — Patient Instructions (Signed)
 Take protonix  30 minutes before breakfast and 30 minutes before your evening meal.   Take pepcid  before bed

## 2023-11-29 ENCOUNTER — Encounter: Payer: Self-pay | Admitting: Internal Medicine

## 2023-11-30 ENCOUNTER — Other Ambulatory Visit: Payer: Self-pay | Admitting: Gastroenterology

## 2023-11-30 ENCOUNTER — Other Ambulatory Visit: Payer: Self-pay

## 2023-11-30 DIAGNOSIS — M349 Systemic sclerosis, unspecified: Secondary | ICD-10-CM

## 2023-11-30 DIAGNOSIS — R1314 Dysphagia, pharyngoesophageal phase: Secondary | ICD-10-CM

## 2023-11-30 DIAGNOSIS — K219 Gastro-esophageal reflux disease without esophagitis: Secondary | ICD-10-CM

## 2023-11-30 MED ORDER — GABAPENTIN 300 MG PO CAPS: 300.0000 mg | ORAL_CAPSULE | Freq: Every day | ORAL | 1 refills | Status: DC

## 2023-11-30 NOTE — Telephone Encounter (Signed)
 Ok to refill gabapentin  for 90 day supply. I can send end -just need to clarify where she wants it sent in.

## 2023-11-30 NOTE — Telephone Encounter (Signed)
 Ok to switch her to a 90 day?

## 2023-12-04 ENCOUNTER — Encounter: Payer: Self-pay | Admitting: Internal Medicine

## 2023-12-04 NOTE — Assessment & Plan Note (Signed)
 Followed by rheumatology. Seeing Dr Lydia Sams and Dr Bernetta Brilliant [facility]). Recent echo ok. Finger tips ok. Referral to GI for further evaluation for swallowing.

## 2023-12-04 NOTE — Assessment & Plan Note (Signed)
 The 10-year ASCVD risk score (Arnett DK, et al., 2019) is: 3.2%   Values used to calculate the score:     Age: 63 years     Sex: Female     Is Non-Hispanic African American: No     Diabetic: No     Tobacco smoker: No     Systolic Blood Pressure: 118 mmHg     Is BP treated: No     HDL Cholesterol: 53.5 mg/dL     Total Cholesterol: 199 mg/dL  Low cholesterol diet and exercise. Follow lipid panel.

## 2023-12-04 NOTE — Assessment & Plan Note (Addendum)
 Continue buspar  7.5mg  bid. Continue gabapentin  300mg  q hs. Her current medical issues have caused some increased anxiety. Overall doing ok. Hold on changing medication. Follow.

## 2023-12-04 NOTE — Assessment & Plan Note (Signed)
No upper symptoms reported.  Continue prilosec.  

## 2023-12-04 NOTE — Assessment & Plan Note (Signed)
 Recent problems with dysphagia. Has a history of scleroderma. Recently evaluated and Prilosec changed to protonix . Swallowing has improved. Now has increased acid reflux.  Currently taking protonix  40mg  in the am. Will increase to 40mg  bid and take pepcid  before bed. Discussed GI evaluation. Will see if can arrange earlier f/u appt with GI for further evaluation. Avoid foods that aggravate. Avoid eating late. Elevate head of bed.

## 2023-12-04 NOTE — Assessment & Plan Note (Signed)
 Has seen dermatology and rheumatology.  Fingers improved. Follow

## 2023-12-05 ENCOUNTER — Encounter (INDEPENDENT_AMBULATORY_CARE_PROVIDER_SITE_OTHER): Payer: Self-pay

## 2023-12-06 ENCOUNTER — Ambulatory Visit
Admission: RE | Admit: 2023-12-06 | Discharge: 2023-12-06 | Disposition: A | Discharge status: Home / Self Care | Source: Ambulatory Visit | Attending: Gastroenterology | Admitting: Gastroenterology

## 2023-12-06 DIAGNOSIS — K219 Gastro-esophageal reflux disease without esophagitis: Secondary | ICD-10-CM | POA: Insufficient documentation

## 2023-12-06 DIAGNOSIS — M349 Systemic sclerosis, unspecified: Secondary | ICD-10-CM | POA: Diagnosis present

## 2023-12-06 DIAGNOSIS — R1314 Dysphagia, pharyngoesophageal phase: Secondary | ICD-10-CM | POA: Insufficient documentation

## 2023-12-06 DIAGNOSIS — K449 Diaphragmatic hernia without obstruction or gangrene: Secondary | ICD-10-CM | POA: Insufficient documentation

## 2023-12-25 ENCOUNTER — Telehealth: Payer: Self-pay | Admitting: Internal Medicine

## 2023-12-25 DIAGNOSIS — M349 Systemic sclerosis, unspecified: Secondary | ICD-10-CM

## 2023-12-25 DIAGNOSIS — E7800 Pure hypercholesterolemia, unspecified: Secondary | ICD-10-CM

## 2023-12-25 NOTE — Telephone Encounter (Signed)
 Patient need labs ordered please

## 2023-12-27 NOTE — Telephone Encounter (Signed)
 Future labs ordered.

## 2024-01-03 ENCOUNTER — Other Ambulatory Visit (INDEPENDENT_AMBULATORY_CARE_PROVIDER_SITE_OTHER)

## 2024-01-03 DIAGNOSIS — E7800 Pure hypercholesterolemia, unspecified: Secondary | ICD-10-CM | POA: Diagnosis not present

## 2024-01-03 DIAGNOSIS — M349 Systemic sclerosis, unspecified: Secondary | ICD-10-CM | POA: Diagnosis not present

## 2024-01-03 LAB — HEPATIC FUNCTION PANEL
ALT: 10 U/L (ref 0–35)
AST: 16 U/L (ref 0–37)
Albumin: 3.9 g/dL (ref 3.5–5.2)
Alkaline Phosphatase: 53 U/L (ref 39–117)
Bilirubin, Direct: 0.1 mg/dL (ref 0.0–0.3)
Total Bilirubin: 0.6 mg/dL (ref 0.2–1.2)
Total Protein: 6.6 g/dL (ref 6.0–8.3)

## 2024-01-03 LAB — BASIC METABOLIC PANEL WITH GFR
BUN: 14 mg/dL (ref 6–23)
CO2: 32 meq/L (ref 19–32)
Calcium: 9.1 mg/dL (ref 8.4–10.5)
Chloride: 103 meq/L (ref 96–112)
Creatinine, Ser: 0.93 mg/dL (ref 0.40–1.20)
GFR: 66.07 mL/min (ref >60.00)
Glucose, Bld: 92 mg/dL (ref 70–99)
Potassium: 4.3 meq/L (ref 3.5–5.1)
Sodium: 138 meq/L (ref 135–145)

## 2024-01-03 LAB — LIPID PANEL
Cholesterol: 178 mg/dL (ref 0–200)
HDL: 51.4 mg/dL (ref >39.00)
LDL Cholesterol: 104 mg/dL — ABNORMAL HIGH (ref 0–99)
NonHDL: 126.15
Total CHOL/HDL Ratio: 3
Triglycerides: 112 mg/dL (ref 0.0–149.0)
VLDL: 22.4 mg/dL (ref 0.0–40.0)

## 2024-01-03 LAB — TSH: TSH: 2.2 u[IU]/mL (ref 0.35–5.50)

## 2024-01-04 ENCOUNTER — Ambulatory Visit: Payer: Self-pay | Admitting: Internal Medicine

## 2024-01-04 DIAGNOSIS — M349 Systemic sclerosis, unspecified: Secondary | ICD-10-CM

## 2024-01-04 DIAGNOSIS — E7800 Pure hypercholesterolemia, unspecified: Secondary | ICD-10-CM

## 2024-01-05 ENCOUNTER — Ambulatory Visit: Admitting: Internal Medicine

## 2024-01-08 ENCOUNTER — Ambulatory Visit: Admitting: Internal Medicine

## 2024-01-08 VITALS — BP 126/70 | HR 73 | Temp 98.0°F | Resp 16 | Ht 64.5 in | Wt 158.0 lb

## 2024-01-08 DIAGNOSIS — F419 Anxiety disorder, unspecified: Secondary | ICD-10-CM

## 2024-01-08 DIAGNOSIS — E7800 Pure hypercholesterolemia, unspecified: Secondary | ICD-10-CM | POA: Diagnosis not present

## 2024-01-08 DIAGNOSIS — Z860100 Personal history of colon polyps, unspecified: Secondary | ICD-10-CM

## 2024-01-08 DIAGNOSIS — R1319 Other dysphagia: Secondary | ICD-10-CM

## 2024-01-08 DIAGNOSIS — I739 Peripheral vascular disease, unspecified: Secondary | ICD-10-CM | POA: Diagnosis not present

## 2024-01-08 DIAGNOSIS — M349 Systemic sclerosis, unspecified: Secondary | ICD-10-CM

## 2024-01-08 DIAGNOSIS — L98491 Non-pressure chronic ulcer of skin of other sites limited to breakdown of skin: Secondary | ICD-10-CM

## 2024-01-08 DIAGNOSIS — K2100 Gastro-esophageal reflux disease with esophagitis, without bleeding: Secondary | ICD-10-CM

## 2024-01-08 MED ORDER — BUSPIRONE HCL 7.5 MG PO TABS: 7.5000 mg | ORAL_TABLET | Freq: Three times a day (TID) | ORAL | 1 refills | Status: DC

## 2024-01-08 NOTE — Assessment & Plan Note (Addendum)
 The 10-year ASCVD risk score (Arnett DK, et al., 2019) is: 3.8%   Values used to calculate the score:     Age: 63 years     Clincally relevant sex: Female     Is Non-Hispanic African American: No     Diabetic: No     Tobacco smoker: No     Systolic Blood Pressure: 126 mmHg     Is BP treated: No     HDL Cholesterol: 51.4 mg/dL     Total Cholesterol: 178 mg/dL  Low cholesterol diet and exercise. Follow lipid panel.

## 2024-01-08 NOTE — Progress Notes (Signed)
 Subjective:    Patient ID: Denise Arnold, female    DOB: 11/25/1960, 63 y.o.   MRN: [id number]  Patient here for  Chief Complaint  Patient presents with   Medical Management of Chronic Issues    HPI Here for a scheduled follow up - follow up regarding increased stress, anxiety, acid reflux, hypercholesterolemia and scleroderma. Saw GI 11/29/23 - instructed to continue protonix  40mg  bid and pepcid  in the evening. Barium swallow - normal appearance of the esophagus and mild GERD. GI had discussed decreasing to q day PPI and taking two altoids before eating. She is taking the altoids. Remains on bid PPI. Appears to be doing ok overall. Increased stress. Taking buspar . Consider increase to tid. Breathing stable.    Past Medical History:  Diagnosis Date   Allergy ?   Penicillin, sulpha drugs, Paxlovid,   Arthritis    GERD (gastroesophageal reflux disease)    History of endoscopy 04/05/2013   upper   Menorrhagia    Reynolds syndrome (HCC)    Scleroderma (HCC)    positive FANA, positive SCL-70 abs, raynauds, mild pulmonary hypertension, normal DLCO   Tricuspid regurgitation    Vitamin D  deficiency    Past Surgical History:  Procedure Laterality Date   CESAREAN SECTION  1994   COLONOSCOPY WITH PROPOFOL  N/A 05/29/2015   Procedure: COLONOSCOPY WITH PROPOFOL ;  Surgeon: Lamar ONEIDA Holmes, MD;  Location: [facility] ENDOSCOPY;  Service: Endoscopy;  Laterality: N/A;   DILATION AND CURETTAGE OF UTERUS  1992   DILATION AND CURETTAGE OF UTERUS  1993   DILATION AND CURETTAGE OF UTERUS  2011   VEIN SURGERY     Family History  Problem Relation Age of Onset   Heart disease Father        myocardial infarction   Arthritis/Rheumatoid Father    Hypertension Father    Arthritis Father    COPD Father    Hyperlipidemia Mother    Breast cancer Maternal Grandmother    Cancer Maternal Grandmother    Colon cancer Neg Hx    Social History   Socioeconomic History   Marital status: Married    Spouse  name: Not on file   Number of children: 1   Years of education: Not on file   Highest education level: Bachelor's degree (e.g., BA, AB, BS)  Occupational History   Not on file  Tobacco Use   Smoking status: Never   Smokeless tobacco: Never  Vaping Use   Vaping status: Never Used  Substance and Sexual Activity   Alcohol use: Yes    Comment: Occasionally   Drug use: No   Sexual activity: Yes    Birth control/protection: Condom  Other Topics Concern   Not on file  Social History Narrative   Not on file   Social Drivers of Health   Financial Resource Strain: Low Risk  (01/08/2024)   Overall Financial Resource Strain (CARDIA)    Difficulty of Paying Living Expenses: Not hard at all  Food Insecurity: No Food Insecurity (01/08/2024)   Hunger Vital Sign    Worried About Running Out of Food in the Last Year: Never true    Ran Out of Food in the Last Year: Never true  Transportation Needs: No Transportation Needs (01/08/2024)   PRAPARE - Administrator, Civil Service (Medical): No    Lack of Transportation (Non-Medical): No  Physical Activity: Sufficiently Active (01/08/2024)   Exercise Vital Sign    Days of Exercise per Week: 4 days  Minutes of Exercise per Session: 50 min  Stress: Stress Concern Present (01/08/2024)   [employer] of Occupational Health - Occupational Stress Questionnaire    Feeling of Stress: To some extent  Social Connections: Socially Integrated (01/08/2024)   Social Connection and Isolation Panel    Frequency of Communication with Friends and Family: More than three times a week    Frequency of Social Gatherings with Friends and Family: Twice a week    Attends Religious Services: More than 4 times per year    Active Member of [employer] or Organizations: Yes    Attends Engineer, structural: More than 4 times per year    Marital Status: Married     Review of Systems  Constitutional:  Negative for appetite change, fever and unexpected  weight change.  HENT:  Negative for congestion and sinus pressure.   Respiratory:  Negative for cough, chest tightness and shortness of breath.   Cardiovascular:  Negative for chest pain and palpitations.  Gastrointestinal:  Negative for abdominal pain, diarrhea, nausea and vomiting.  Genitourinary:  Negative for difficulty urinating and dysuria.  Musculoskeletal:  Negative for myalgias.       No increased joint pain.   Skin:  Negative for color change and rash.  Neurological:  Negative for dizziness and headaches.  Psychiatric/Behavioral:  Negative for agitation and dysphoric mood.        Objective:     BP 126/70   Pulse 73   Temp 98 F (36.7 C)   Resp 16   Ht 5' 4.5 (1.638 m)   Wt 158 lb (71.7 kg)   LMP 02/14/2010   SpO2 97%   BMI 26.70 kg/m  Wt Readings from Last 3 Encounters:  01/08/24 158 lb (71.7 kg)  11/27/23 157 lb (71.2 kg)  11/17/23 157 lb 9.6 oz (71.5 kg)    Physical Exam Vitals reviewed.  Constitutional:      General: She is not in acute distress.    Appearance: Normal appearance.  HENT:     Head: Normocephalic and atraumatic.     Right Ear: External ear normal.     Left Ear: External ear normal.     Mouth/Throat:     Pharynx: No oropharyngeal exudate or posterior oropharyngeal erythema.   Eyes:     General: No scleral icterus.       Right eye: No discharge.        Left eye: No discharge.     Conjunctiva/sclera: Conjunctivae normal.   Neck:     Thyroid : No thyromegaly.   Cardiovascular:     Rate and Rhythm: Normal rate and regular rhythm.  Pulmonary:     Effort: No respiratory distress.     Breath sounds: Normal breath sounds. No wheezing.  Abdominal:     General: Bowel sounds are normal.     Palpations: Abdomen is soft.     Tenderness: There is no abdominal tenderness.   Musculoskeletal:        General: No swelling or tenderness.     Cervical back: Neck supple. No tenderness.  Lymphadenopathy:     Cervical: No cervical adenopathy.    Skin:    Findings: No erythema or rash.   Neurological:     Mental Status: She is alert.   Psychiatric:        Mood and Affect: Mood normal.        Behavior: Behavior normal.         Outpatient Encounter Medications as of 01/08/2024  Medication Sig   aspirin 81 MG chewable tablet Chew 81 mg by mouth daily.   azelastine  (ASTELIN ) 0.1 % nasal spray PLACE 1 SPRAY INTO BOTH NOSTRILS 2 (TWO) TIMES DAILY. USE IN EACH NOSTRIL AS DIRECTED   Biotin 5000 MCG TABS    busPIRone  (BUSPAR ) 7.5 MG tablet Take 1 tablet (7.5 mg total) by mouth 3 (three) times daily.   Calcium-Magnesium-Vitamin D  (CALCIUM 500 PO) Take by mouth.   Cholecalciferol (VITAMIN D ) 50 MCG (2000 UT) tablet Take 2,000 Units by mouth daily.   ciclopirox  (LOPROX ) 0.77 % cream Apply topically daily.   clobetasol  cream (TEMOVATE ) 0.05 % Apply to elbows twice a day as needed. Avoid face, groin, axilla.   Cobalamin Combinations (B-12) [phone] MCG SUBL daily.   Cranberry 400 MG CAPS Take by mouth.   doxycycline  (VIBRA -TABS) 100 MG tablet Take 1 tablet (100 mg total) by mouth 2 (two) times daily.   famotidine  (PEPCID ) 20 MG tablet Take 1 tablet (20 mg total) by mouth at bedtime.   fish oil-omega-3 fatty acids 1000 MG capsule Take 1 g by mouth daily.   gabapentin  (NEURONTIN ) 300 MG capsule Take 1 capsule (300 mg total) by mouth at bedtime.   Glucosamine-Chondroitin 1500-1200 MG/30ML LIQD    Magnesium 250 MG TABS    MISC NATURAL PRODUCTS PO Herbal Supplement   Multiple Vitamin (MULTIVITAMIN) tablet Take 1 tablet by mouth daily.   mupirocin  ointment (BACTROBAN ) 2 % Apply topically 3 (three) times daily. Left elbow   pantoprazole  (PROTONIX ) 40 MG tablet Take 1 tablet (40 mg total) by mouth 2 (two) times daily before a meal.   Probiotic Product (PROBIOTIC PO) Take by mouth.   RYALTRIS 665-25 MCG/ACT SUSP INSTILL 2 SPRAYS IN EACH NOSTRIL TWICE DAILY   SANTYL 250 UNIT/GM ointment Apply 1 Application topically daily.   sildenafil  (REVATIO) 20 MG tablet Take 20 mg by mouth 2 (two) times daily.   tacrolimus  (PROTOPIC ) 0.1 % ointment APPLY TO AFFECTED AREAS ONCE TO TWICE A DAY.   Tavaborole  5 % SOLN APPLY TO AFFECTED TOENAIL EVERY NIGHT UNTIL IMPROVED.   [DISCONTINUED] busPIRone  (BUSPAR ) 7.5 MG tablet Take 1 tablet (7.5 mg total) by mouth 2 (two) times daily.   No facility-administered encounter medications on file as of 01/08/2024.     Lab Results  Component Value Date   WBC 4.9 09/11/2023   HGB 13.4 09/11/2023   HCT 40.1 09/11/2023   PLT 205.0 09/11/2023   GLUCOSE 92 01/03/2024   CHOL 178 01/03/2024   TRIG 112.0 01/03/2024   HDL 51.40 01/03/2024   LDLDIRECT 113.0 10/24/2022   LDLCALC 104 (H) 01/03/2024   ALT 10 01/03/2024   AST 16 01/03/2024   NA 138 01/03/2024   K 4.3 01/03/2024   CL 103 01/03/2024   CREATININE 0.93 01/03/2024   BUN 14 01/03/2024   CO2 32 01/03/2024   TSH 2.20 01/03/2024    DG ESOPHAGUS W DOUBLE CM (HD) Result Date: 12/07/2023 CLINICAL DATA:  63 year old female with a history of scleroderma and GERD not currently well controlled on medication. Feels like solids get stuck in her lower chest. EXAM: ESOPHAGUS/BARIUM SWALLOW/TABLET STUDY TECHNIQUE: Combined double and single contrast examination was performed using effervescent crystals, high-density barium, and thin liquid barium. This exam was performed by [facility] PA-C, and was supervised and interpreted by Dr. DELENA Balder. FLUOROSCOPY: Radiation Exposure Index (as provided by the fluoroscopic device): 19.2 mGy Kerma COMPARISON:  DG SWALLOW FUNC SPEECH PATH - 11/15/23. FINDINGS: Swallowing: Appears normal. No  vestibular penetration or aspiration seen. Pharynx: Unremarkable. Esophagus: Normal appearance. No obvious mucosal abnormality or ulceration. No focal narrowing or stricture. Esophageal motility: Within normal limits. Hiatal Hernia: None. Gastroesophageal reflux: Evidence for gastroesophageal reflux into the distal esophagus. Ingested  13mm barium tablet: Passed without difficulty into the stomach. Other: None. IMPRESSION: Normal appearance of the esophagus with mild gastroesophageal reflux. Electronically Signed   By: Juliene Balder M.D.   On: 12/07/2023 09:31       Assessment & Plan:  Hypercholesteremia Assessment & Plan: The 10-year ASCVD risk score (Arnett DK, et al., 2019) is: 3.8%   Values used to calculate the score:     Age: 84 years     Clincally relevant sex: Female     Is Non-Hispanic African American: No     Diabetic: No     Tobacco smoker: No     Systolic Blood Pressure: 126 mmHg     Is BP treated: No     HDL Cholesterol: 51.4 mg/dL     Total Cholesterol: 178 mg/dL  Low cholesterol diet and exercise. Follow lipid panel.    Scleroderma (HCC) Assessment & Plan: Followed by rheumatology. Seeing Dr Tobie and Dr Loreli [facility]). Recent echo ok. Finger tips ok. Seeing GI for swallowing issues and reflux. Continue PPI. Continue f/u with rheumatology.    PAD (peripheral artery disease) (HCC) Assessment & Plan: Saw vascular 08/16/23 - noninvasive studies showed improvement.  Recommended f/u in one year.    Other dysphagia Assessment & Plan: Saw GI. Barium swallow as outlined - normal appearance of the esophagus and mild GERD. Continues on PPI as outlined. Swallowing better.    History of colonic polyps Assessment & Plan: Colonoscopy 03/2021.    Gastroesophageal reflux disease with esophagitis without hemorrhage Assessment & Plan: Saw GI as outlined. Barium swallow as outlined. Continue PPI.    Skin ulcer of finger, limited to breakdown of skin [facility]) Assessment & Plan: Has seen dermatology and rheumatology.  Finger tips - improved. Follow.    Anxiety Assessment & Plan: Curently on buspar  7.5mg  bid. Continue gabapentin  300mg  q hs. Her current medical issues have caused some increased anxiety. Overall doing ok. Consider increase buspar  tid.    Other orders -     busPIRone  HCl; Take 1 tablet (7.5 mg  total) by mouth 3 (three) times daily.  Dispense: 270 tablet; Refill: 1     Allena Hamilton, MD

## 2024-01-10 NOTE — Telephone Encounter (Signed)
 Copied from CRM [id number]. Topic: Clinical - Request for Lab/Test Order >> Jan 10, 2024  9:55 AM Denise Arnold wrote: Reason for CRM: Patient is calling in to set up her 3 month follow up and lab work appointment Dr. Glendia requested. No lab work has been ordered yet, please let patient know when it is ordered.

## 2024-01-13 ENCOUNTER — Encounter: Payer: Self-pay | Admitting: Internal Medicine

## 2024-01-13 NOTE — Assessment & Plan Note (Signed)
 Saw GI. Barium swallow as outlined - normal appearance of the esophagus and mild GERD. Continues on PPI as outlined. Swallowing better.

## 2024-01-13 NOTE — Assessment & Plan Note (Signed)
 Has seen dermatology and rheumatology.  Finger tips - improved. Follow.

## 2024-01-13 NOTE — Assessment & Plan Note (Signed)
 Saw GI as outlined. Barium swallow as outlined. Continue PPI.

## 2024-01-13 NOTE — Assessment & Plan Note (Signed)
 Saw vascular 08/16/23 - noninvasive studies showed improvement. Recommended f/u in one year.

## 2024-01-13 NOTE — Assessment & Plan Note (Signed)
Colonoscopy 03/2021.  

## 2024-01-13 NOTE — Assessment & Plan Note (Signed)
 Followed by rheumatology. Seeing Dr Tobie and Dr Loreli [facility]). Recent echo ok. Finger tips ok. Seeing GI for swallowing issues and reflux. Continue PPI. Continue f/u with rheumatology.

## 2024-01-13 NOTE — Assessment & Plan Note (Signed)
 Curently on buspar  7.5mg  bid. Continue gabapentin  300mg  q hs. Her current medical issues have caused some increased anxiety. Overall doing ok. Consider increase buspar  tid.

## 2024-02-07 ENCOUNTER — Ambulatory Visit
Admission: RE | Admit: 2024-02-07 | Discharge: 2024-02-07 | Disposition: A | Discharge status: Home / Self Care | Source: Ambulatory Visit | Attending: Internal Medicine | Admitting: Internal Medicine

## 2024-02-07 DIAGNOSIS — Z1231 Encounter for screening mammogram for malignant neoplasm of breast: Secondary | ICD-10-CM | POA: Insufficient documentation

## 2024-02-13 ENCOUNTER — Telehealth: Admitting: Physician Assistant

## 2024-02-13 DIAGNOSIS — J020 Streptococcal pharyngitis: Secondary | ICD-10-CM | POA: Diagnosis not present

## 2024-02-13 DIAGNOSIS — B379 Candidiasis, unspecified: Secondary | ICD-10-CM | POA: Diagnosis not present

## 2024-02-13 DIAGNOSIS — T3695XA Adverse effect of unspecified systemic antibiotic, initial encounter: Secondary | ICD-10-CM

## 2024-02-13 MED ORDER — AZITHROMYCIN 250 MG PO TABS: ORAL_TABLET | ORAL | 0 refills | Status: AC

## 2024-02-13 MED ORDER — FLUCONAZOLE 150 MG PO TABS
150.0000 mg | ORAL_TABLET | ORAL | 0 refills | Status: DC | PRN
Start: 2024-02-13 — End: 2024-04-08

## 2024-02-13 NOTE — Progress Notes (Signed)
 Virtual Visit Consent   Denise Arnold, you are scheduled for a virtual visit with a [facility] provider today. Just as with appointments in the office, your consent must be obtained to participate. Your consent will be active for this visit and any virtual visit you may have with one of our providers in the next 365 days. If you have a MyChart account, a copy of this consent can be sent to you electronically.  As this is a virtual visit, video technology does not allow for your provider to perform a traditional examination. This may limit your provider's ability to fully assess your condition. If your provider identifies any concerns that need to be evaluated in person or the need to arrange testing (such as labs, EKG, etc.), we will make arrangements to do so. Although advances in technology are sophisticated, we cannot ensure that it will always work on either your end or our end. If the connection with a video visit is poor, the visit may have to be switched to a telephone visit. With either a video or telephone visit, we are not always able to ensure that we have a secure connection.  By engaging in this virtual visit, you consent to the provision of healthcare and authorize for your insurance to be billed (if applicable) for the services provided during this visit. Depending on your insurance coverage, you may receive a charge related to this service.  I need to obtain your verbal consent now. Are you willing to proceed with your visit today? Consandra Laske [name] has provided verbal consent on 02/13/2024 for a virtual visit (video or telephone). [name], PA-C  Date: 02/13/2024 9:06 AM   Virtual Visit via Video Note   I, [name], connected with  Denise Arnold  ([id number], 12-20-1960) on 02/13/24 at  9:00 AM EDT by a video-enabled telemedicine application and verified that I am speaking with the correct person using two identifiers.  Location: Patient: Virtual Visit Location  Patient: Home Provider: Virtual Visit Location Provider: Home Office   I discussed the limitations of evaluation and management by telemedicine and the availability of in person appointments. The patient expressed understanding and agreed to proceed.    History of Present Illness: Denise Arnold is a 63 y.o. who identifies as a female who was assigned female at birth, and is being seen today for sore throat with exudate.  HPI: Sore Throat  This is a new problem. The problem has been gradually worsening. There has been no fever. The pain is moderate. Associated symptoms include congestion, diarrhea, headaches, swollen glands and trouble swallowing. Pertinent negatives include no coughing, ear pain, hoarse voice or vomiting. Associated symptoms comments: Body aches, exudate and pus-like appearance on tonsils. She has had exposure to strep. She has tried acetaminophen for the symptoms. The treatment provided no relief.   At home test for Covid was negative BP: 129/74 T:99.7 O2: 96% HR 81  Problems:  Patient Active Problem List   Diagnosis Date Noted   Other dysphagia 11/17/2023   Hiatal hernia 11/17/2023   Gastric polyps 11/17/2023   PAD (peripheral artery disease) (HCC) 08/17/2023   Leg cramps 04/14/2023   Left shoulder pain 03/19/2023   SOB (shortness of breath) 01/30/2023   Absent pulse 01/30/2023   Hypercholesteremia 01/25/2023   Abdominal pain 10/24/2022   Knee pain, left 06/17/2022   Finger ulcer (HCC) 01/16/2022   Breast pain, left 01/16/2022   Urinary urgency 10/11/2021   Headache 06/22/2021  Anxiety 06/06/2021   Palpitations 05/28/2021   Diarrhea 05/25/2021   Fatigue 05/25/2021   Axillary fullness 01/13/2019   Cellulitis 01/16/2018   Localized swelling, mass and lump, upper limb 01/12/2018   Health care maintenance 01/25/2015   History of colonic polyps 04/22/2013   Vitamin D  deficiency 01/15/2013   Degenerative cervical disc 01/15/2013   GERD (gastroesophageal reflux  disease) 01/15/2013   Scleroderma (HCC) 07/17/2012   Raynauds phenomenon 07/17/2012    Allergies:  Allergies  Allergen Reactions   Sulfa Antibiotics Nausea And Vomiting and Nausea Only   Keflex  [Cephalexin ] Nausea Only   Nirmatrelvir-Ritonavir Diarrhea and Palpitations   Penicillins Other (See Comments) and Rash   Medications:  Current Outpatient Medications:    aspirin 81 MG chewable tablet, Chew 81 mg by mouth daily., Disp: , Rfl:    azelastine  (ASTELIN ) 0.1 % nasal spray, PLACE 1 SPRAY INTO BOTH NOSTRILS 2 (TWO) TIMES DAILY. USE IN EACH NOSTRIL AS DIRECTED, Disp: 30 mL, Rfl: 1   azithromycin  (ZITHROMAX ) 250 MG tablet, Take 2 tablets on day 1, then 1 tablet daily on days 2 through 5, Disp: 6 tablet, Rfl: 0   Biotin 5000 MCG TABS, , Disp: , Rfl:    busPIRone  (BUSPAR ) 7.5 MG tablet, Take 1 tablet (7.5 mg total) by mouth 3 (three) times daily., Disp: 270 tablet, Rfl: 1   Calcium-Magnesium-Vitamin D  (CALCIUM 500 PO), Take by mouth., Disp: , Rfl:    Cholecalciferol (VITAMIN D ) 50 MCG (2000 UT) tablet, Take 2,000 Units by mouth daily., Disp: , Rfl:    ciclopirox  (LOPROX ) 0.77 % cream, Apply topically daily., Disp: 30 g, Rfl: 0   clobetasol  cream (TEMOVATE ) 0.05 %, Apply to elbows twice a day as needed. Avoid face, groin, axilla., Disp: 30 g, Rfl: 0   Cobalamin Combinations (B-12) [phone] MCG SUBL, daily., Disp: , Rfl:    Cranberry 400 MG CAPS, Take by mouth., Disp: , Rfl:    doxycycline  (VIBRA -TABS) 100 MG tablet, Take 1 tablet (100 mg total) by mouth 2 (two) times daily., Disp: 14 tablet, Rfl: 0   famotidine  (PEPCID ) 20 MG tablet, Take 1 tablet (20 mg total) by mouth at bedtime., Disp: 30 tablet, Rfl: 0   fish oil-omega-3 fatty acids 1000 MG capsule, Take 1 g by mouth daily., Disp: , Rfl:    fluconazole  (DIFLUCAN ) 150 MG tablet, Take 1 tablet (150 mg total) by mouth every 3 (three) days as needed., Disp: 2 tablet, Rfl: 0   gabapentin  (NEURONTIN ) 300 MG capsule, Take 1 capsule (300 mg  total) by mouth at bedtime., Disp: 90 capsule, Rfl: 1   Glucosamine-Chondroitin 1500-1200 MG/30ML LIQD, , Disp: , Rfl:    Magnesium 250 MG TABS, , Disp: , Rfl:    MISC NATURAL PRODUCTS PO, Herbal Supplement, Disp: , Rfl:    Multiple Vitamin (MULTIVITAMIN) tablet, Take 1 tablet by mouth daily., Disp: , Rfl:    mupirocin  ointment (BACTROBAN ) 2 %, Apply topically 3 (three) times daily. Left elbow, Disp: 30 g, Rfl: 0   pantoprazole  (PROTONIX ) 40 MG tablet, Take 1 tablet (40 mg total) by mouth 2 (two) times daily before a meal., Disp: 60 tablet, Rfl: 2   Probiotic Product (PROBIOTIC PO), Take by mouth., Disp: , Rfl:    RYALTRIS 665-25 MCG/ACT SUSP, INSTILL 2 SPRAYS IN EACH NOSTRIL TWICE DAILY, Disp: , Rfl:    SANTYL 250 UNIT/GM ointment, Apply 1 Application topically daily., Disp: , Rfl:    sildenafil (REVATIO) 20 MG tablet, Take 20 mg by mouth 2 (  two) times daily., Disp: , Rfl:    tacrolimus  (PROTOPIC ) 0.1 % ointment, APPLY TO AFFECTED AREAS ONCE TO TWICE A DAY., Disp: 60 g, Rfl: 1   Tavaborole  5 % SOLN, APPLY TO AFFECTED TOENAIL EVERY NIGHT UNTIL IMPROVED., Disp: 10 mL, Rfl: 2  Observations/Objective: Patient is well-developed, well-nourished in no acute distress.  Resting comfortably at home.  Head is normocephalic, atraumatic.  No labored breathing.  Speech is clear and coherent with logical content.  Patient is alert and oriented at baseline.    Assessment and Plan: 1. Strep throat (Primary) - azithromycin  (ZITHROMAX ) 250 MG tablet; Take 2 tablets on day 1, then 1 tablet daily on days 2 through 5  Dispense: 6 tablet; Refill: 0  2. Antibiotic-induced yeast infection - fluconazole  (DIFLUCAN ) 150 MG tablet; Take 1 tablet (150 mg total) by mouth every 3 (three) days as needed.  Dispense: 2 tablet; Refill: 0  - Suspect strep throat - Azithromycin  prescribed - Tylenol and Ibuprofen alternating every 4 hours - Salt water gargles - Chloraseptic spray - Liquid and soft food diet - Push  fluids - New toothbrush in 3 days - Seek in person evaluation if not improving or if symptoms worsen   Follow Up Instructions: I discussed the assessment and treatment plan with the patient. The patient was provided an opportunity to ask questions and all were answered. The patient agreed with the plan and demonstrated an understanding of the instructions.  A copy of instructions were sent to the patient via MyChart unless otherwise noted below.    The patient was advised to call back or seek an in-person evaluation if the symptoms worsen or if the condition fails to improve as anticipated.    [name], PA-C

## 2024-02-13 NOTE — Patient Instructions (Signed)
 Denise Arnold, thank you for joining Denise CHRISTELLA Dickinson, PA-C for today's virtual visit.  While this provider is not your primary care provider (PCP), if your PCP is located in our provider database this encounter information will be shared with them immediately following your visit.   A [facility] MyChart account gives you access to today's visit and all your visits, tests, and labs performed at [facility]  click here if you don't have a [facility] MyChart account or go to [email or web address]  Consent: (Patient) Denise Arnold provided verbal consent for this virtual visit at the beginning of the encounter.  Current Medications:  Current Outpatient Medications:    aspirin 81 MG chewable tablet, Chew 81 mg by mouth daily., Disp: , Rfl:    azelastine  (ASTELIN ) 0.1 % nasal spray, PLACE 1 SPRAY INTO BOTH NOSTRILS 2 (TWO) TIMES DAILY. USE IN EACH NOSTRIL AS DIRECTED, Disp: 30 mL, Rfl: 1   azithromycin  (ZITHROMAX ) 250 MG tablet, Take 2 tablets on day 1, then 1 tablet daily on days 2 through 5, Disp: 6 tablet, Rfl: 0   Biotin 5000 MCG TABS, , Disp: , Rfl:    busPIRone  (BUSPAR ) 7.5 MG tablet, Take 1 tablet (7.5 mg total) by mouth 3 (three) times daily., Disp: 270 tablet, Rfl: 1   Calcium-Magnesium-Vitamin D  (CALCIUM 500 PO), Take by mouth., Disp: , Rfl:    Cholecalciferol (VITAMIN D ) 50 MCG (2000 UT) tablet, Take 2,000 Units by mouth daily., Disp: , Rfl:    ciclopirox  (LOPROX ) 0.77 % cream, Apply topically daily., Disp: 30 g, Rfl: 0   clobetasol  cream (TEMOVATE ) 0.05 %, Apply to elbows twice a day as needed. Avoid face, groin, axilla., Disp: 30 g, Rfl: 0   Cobalamin Combinations (B-12) [phone] MCG SUBL, daily., Disp: , Rfl:    Cranberry 400 MG CAPS, Take by mouth., Disp: , Rfl:    doxycycline  (VIBRA -TABS) 100 MG tablet, Take 1 tablet (100 mg total) by mouth 2 (two) times daily., Disp: 14 tablet, Rfl: 0   famotidine  (PEPCID ) 20 MG tablet, Take 1 tablet (20 mg total) by mouth at  bedtime., Disp: 30 tablet, Rfl: 0   fish oil-omega-3 fatty acids 1000 MG capsule, Take 1 g by mouth daily., Disp: , Rfl:    fluconazole  (DIFLUCAN ) 150 MG tablet, Take 1 tablet (150 mg total) by mouth every 3 (three) days as needed., Disp: 2 tablet, Rfl: 0   gabapentin  (NEURONTIN ) 300 MG capsule, Take 1 capsule (300 mg total) by mouth at bedtime., Disp: 90 capsule, Rfl: 1   Glucosamine-Chondroitin 1500-1200 MG/30ML LIQD, , Disp: , Rfl:    Magnesium 250 MG TABS, , Disp: , Rfl:    MISC NATURAL PRODUCTS PO, Herbal Supplement, Disp: , Rfl:    Multiple Vitamin (MULTIVITAMIN) tablet, Take 1 tablet by mouth daily., Disp: , Rfl:    mupirocin  ointment (BACTROBAN ) 2 %, Apply topically 3 (three) times daily. Left elbow, Disp: 30 g, Rfl: 0   pantoprazole  (PROTONIX ) 40 MG tablet, Take 1 tablet (40 mg total) by mouth 2 (two) times daily before a meal., Disp: 60 tablet, Rfl: 2   Probiotic Product (PROBIOTIC PO), Take by mouth., Disp: , Rfl:    RYALTRIS 665-25 MCG/ACT SUSP, INSTILL 2 SPRAYS IN EACH NOSTRIL TWICE DAILY, Disp: , Rfl:    SANTYL 250 UNIT/GM ointment, Apply 1 Application topically daily., Disp: , Rfl:    sildenafil (REVATIO) 20 MG tablet, Take 20 mg by mouth 2 (two) times daily., Disp: , Rfl:  tacrolimus  (PROTOPIC ) 0.1 % ointment, APPLY TO AFFECTED AREAS ONCE TO TWICE A DAY., Disp: 60 g, Rfl: 1   Tavaborole  5 % SOLN, APPLY TO AFFECTED TOENAIL EVERY NIGHT UNTIL IMPROVED., Disp: 10 mL, Rfl: 2   Medications ordered in this encounter:  Meds ordered this encounter  Medications   azithromycin  (ZITHROMAX ) 250 MG tablet    Sig: Take 2 tablets on day 1, then 1 tablet daily on days 2 through 5    Dispense:  6 tablet    Refill:  0    Supervising Provider:   LAMPTEY, PHILIP O [[id number]]   fluconazole  (DIFLUCAN ) 150 MG tablet    Sig: Take 1 tablet (150 mg total) by mouth every 3 (three) days as needed.    Dispense:  2 tablet    Refill:  0    Supervising Provider:   LAMPTEY, PHILIP O [[id number]]     *If  you need refills on other medications prior to your next appointment, please contact your pharmacy*  Follow-Up: Call back or seek an in-person evaluation if the symptoms worsen or if the condition fails to improve as anticipated.  [facility] [phone]  Other Instructions Strep Throat, Adult Strep throat is an infection in the throat that is caused by bacteria. It is common during the cold months of the year. It mostly affects children who are 36-1 years old. However, people of all ages can get it at any time of the year. This infection spreads from person to person (is contagious) through coughing, sneezing, or having close contact. Your health care provider may use other names to describe the infection. When strep throat affects the tonsils, it is called tonsillitis. When it affects the back of the throat, it is called pharyngitis. What are the causes? This condition is caused by the Streptococcus pyogenes bacteria. What increases the risk? You are more likely to develop this condition if: You care for school-age children, or are around school-age children. Children are more likely to get strep throat and may spread it to others. You spend time in crowded places where the infection can spread easily. You have close contact with someone who has strep throat. What are the signs or symptoms? Symptoms of this condition include: Fever or chills. Redness, swelling, or pain in the tonsils or throat. Pain or difficulty when swallowing. White or yellow spots on the tonsils or throat. Tender glands in the neck and under the jaw. Bad smelling breath. Red rash all over the body. This is rare. How is this diagnosed? This condition is diagnosed by tests that check for the presence and the amount of bacteria that cause strep throat. They are: Rapid strep test. Your throat is swabbed and checked for the presence of bacteria. Results are usually ready in minutes. Throat culture test.  Your throat is swabbed. The sample is placed in a cup that allows infections to grow. Results are usually ready in 1 or 2 days. How is this treated? This condition may be treated with: Medicines that kill germs (antibiotics). Medicines that relieve pain or fever. These include: Ibuprofen or acetaminophen. Aspirin, only for people who are over the age of 59. Throat lozenges. Throat sprays. Follow these instructions at home: Medicines  Take over-the-counter and prescription medicines only as told by your health care provider. Take your antibiotic medicine as told by your health care provider. Do not stop taking the antibiotic even if you start to feel better. Eating and drinking  If you have  trouble swallowing, try eating soft foods until your sore throat feels better. Drink enough fluid to keep your urine pale yellow. To help relieve pain, you may have: Warm fluids, such as soup and tea. Cold fluids, such as frozen desserts or popsicles. General instructions Gargle with a salt-water mixture 3-4 times a day or as needed. To make a salt-water mixture, completely dissolve -1 tsp (3-6 g) of salt in 1 cup (237 mL) of warm water. Get plenty of rest. Stay home from work or school until you have been taking antibiotics for 24 hours. Do not use any products that contain nicotine or tobacco. These products include cigarettes, chewing tobacco, and vaping devices, such as e-cigarettes. If you need help quitting, ask your health care provider. It is up to you to get your test results. Ask your health care provider, or the department that is doing the test, when your results will be ready. Keep all follow-up visits. This is important. How is this prevented?  Do not share food, drinking cups, or personal items that could cause the infection to spread to other people. Wash your hands often with soap and water for at least 20 seconds. If soap and water are not available, use hand sanitizer. Make sure  that all people in your house wash their hands well. Have family members tested if they have a sore throat or fever. They may need an antibiotic if they have strep throat. Contact a health care provider if: You have swelling in your neck that keeps getting bigger. You develop a rash, cough, or earache. You cough up a thick mucus that is green, yellow-brown, or bloody. You have pain or discomfort that does not get better with medicine. Your symptoms seem to be getting worse. You have a fever. Get help right away if: You have new symptoms, such as vomiting, severe headache, stiff or painful neck, chest pain, or shortness of breath. You have severe throat pain, drooling, or changes in your voice. You have swelling of the neck, or the skin on the neck becomes red and tender. You have signs of dehydration, such as tiredness (fatigue), dry mouth, and decreased urination. You become increasingly sleepy, or you cannot wake up completely. Your joints become red or painful. These symptoms may represent a serious problem that is an emergency. Do not wait to see if the symptoms will go away. Get medical help right away. Call your local emergency services (911 in the [place] Do not drive yourself to the hospital. Summary Strep throat is an infection in the throat that is caused by the Streptococcus pyogenes bacteria. This infection is spread from person to person (is contagious) through coughing, sneezing, or having close contact. Take your medicines, including antibiotics, as told by your health care provider. Do not stop taking the antibiotic even if you start to feel better. To prevent the spread of germs, wash your hands well with soap and water. Have others do the same. Do not share food, drinking cups, or personal items. Get help right away if you have new symptoms, such as vomiting, severe headache, stiff or painful neck, chest pain, or shortness of breath. This information is not intended to replace  advice given to you by your health care provider. Make sure you discuss any questions you have with your health care provider. Document Revised: 10/27/2020 Document Reviewed: 10/27/2020 Elsevier Patient Education  2024 [employer].   If you have been instructed to have an in-person evaluation today at a local Urgent  Care facility, please use the link below. It will take you to a list of all of our available [facility], including address, phone number and hours of operation. Please do not delay care.  [facility]  If you or a family member do not have a primary care provider, use the link below to schedule a visit and establish care. When you choose a [facility] primary care physician or advanced practice provider, you gain a long-term partner in health. Find a Primary Care Provider  Learn more about [facility]'s in-office and virtual care options: [facility] - Get Care Now

## 2024-03-27 ENCOUNTER — Telehealth: Admitting: Physician Assistant

## 2024-03-27 DIAGNOSIS — M349 Systemic sclerosis, unspecified: Secondary | ICD-10-CM | POA: Diagnosis not present

## 2024-03-27 DIAGNOSIS — L98491 Non-pressure chronic ulcer of skin of other sites limited to breakdown of skin: Secondary | ICD-10-CM

## 2024-03-27 DIAGNOSIS — L03011 Cellulitis of right finger: Secondary | ICD-10-CM | POA: Diagnosis not present

## 2024-03-27 MED ORDER — DOXYCYCLINE HYCLATE 100 MG PO TABS: 100.0000 mg | ORAL_TABLET | Freq: Two times a day (BID) | ORAL | 0 refills | Status: DC

## 2024-03-27 NOTE — Patient Instructions (Addendum)
 Denise Arnold, thank you for joining Denise Velma Lunger, PA-C for today's virtual visit.  While this provider is not your primary care provider (PCP), if your PCP is located in our provider database this encounter information will be shared with them immediately following your visit.   A [facility] MyChart account gives you access to today's visit and all your visits, tests, and labs performed at [facility]  click here if you don't have a [facility] MyChart account or go to [email or web address]  Consent: (Patient) Denise Arnold provided verbal consent for this virtual visit at the beginning of the encounter.  Current Medications:  Current Outpatient Medications:    doxycycline  (VIBRA -TABS) 100 MG tablet, Take 1 tablet (100 mg total) by mouth 2 (two) times daily., Disp: 20 tablet, Rfl: 0   aspirin 81 MG chewable tablet, Chew 81 mg by mouth daily., Disp: , Rfl:    azelastine  (ASTELIN ) 0.1 % nasal spray, PLACE 1 SPRAY INTO BOTH NOSTRILS 2 (TWO) TIMES DAILY. USE IN EACH NOSTRIL AS DIRECTED, Disp: 30 mL, Rfl: 1   Biotin 5000 MCG TABS, , Disp: , Rfl:    busPIRone  (BUSPAR ) 7.5 MG tablet, Take 1 tablet (7.5 mg total) by mouth 3 (three) times daily., Disp: 270 tablet, Rfl: 1   Calcium-Magnesium-Vitamin D  (CALCIUM 500 PO), Take by mouth., Disp: , Rfl:    Cholecalciferol (VITAMIN D ) 50 MCG (2000 UT) tablet, Take 2,000 Units by mouth daily., Disp: , Rfl:    ciclopirox  (LOPROX ) 0.77 % cream, Apply topically daily., Disp: 30 g, Rfl: 0   clobetasol  cream (TEMOVATE ) 0.05 %, Apply to elbows twice a day as needed. Avoid face, groin, axilla., Disp: 30 g, Rfl: 0   Cobalamin Combinations (B-12) [phone] MCG SUBL, daily., Disp: , Rfl:    Cranberry 400 MG CAPS, Take by mouth., Disp: , Rfl:    famotidine  (PEPCID ) 20 MG tablet, Take 1 tablet (20 mg total) by mouth at bedtime., Disp: 30 tablet, Rfl: 0   fish oil-omega-3 fatty acids 1000 MG capsule, Take 1 g by mouth daily., Disp: , Rfl:     fluconazole  (DIFLUCAN ) 150 MG tablet, Take 1 tablet (150 mg total) by mouth every 3 (three) days as needed., Disp: 2 tablet, Rfl: 0   gabapentin  (NEURONTIN ) 300 MG capsule, Take 1 capsule (300 mg total) by mouth at bedtime., Disp: 90 capsule, Rfl: 1   Glucosamine-Chondroitin 1500-1200 MG/30ML LIQD, , Disp: , Rfl:    Magnesium 250 MG TABS, , Disp: , Rfl:    MISC NATURAL PRODUCTS PO, Herbal Supplement, Disp: , Rfl:    Multiple Vitamin (MULTIVITAMIN) tablet, Take 1 tablet by mouth daily., Disp: , Rfl:    mupirocin  ointment (BACTROBAN ) 2 %, Apply topically 3 (three) times daily. Left elbow, Disp: 30 g, Rfl: 0   pantoprazole  (PROTONIX ) 40 MG tablet, Take 1 tablet (40 mg total) by mouth 2 (two) times daily before a meal., Disp: 60 tablet, Rfl: 2   Probiotic Product (PROBIOTIC PO), Take by mouth., Disp: , Rfl:    RYALTRIS 665-25 MCG/ACT SUSP, INSTILL 2 SPRAYS IN EACH NOSTRIL TWICE DAILY, Disp: , Rfl:    SANTYL 250 UNIT/GM ointment, Apply 1 Application topically daily., Disp: , Rfl:    sildenafil (REVATIO) 20 MG tablet, Take 20 mg by mouth 2 (two) times daily., Disp: , Rfl:    tacrolimus  (PROTOPIC ) 0.1 % ointment, APPLY TO AFFECTED AREAS ONCE TO TWICE A DAY., Disp: 60 g, Rfl: 1   Tavaborole  5 % SOLN, APPLY TO  AFFECTED TOENAIL EVERY NIGHT UNTIL IMPROVED., Disp: 10 mL, Rfl: 2   Medications ordered in this encounter:  Meds ordered this encounter  Medications   doxycycline  (VIBRA -TABS) 100 MG tablet    Sig: Take 1 tablet (100 mg total) by mouth 2 (two) times daily.    Dispense:  20 tablet    Refill:  0    Supervising Provider:   LAMPTEY, PHILIP O [[id number]]     *If you need refills on other medications prior to your next appointment, please contact your pharmacy*  Follow-Up: Call back or seek an in-person evaluation if the symptoms worsen or if the condition fails to improve as anticipated.  [facility] [phone]  Other Instructions Please continue your regular medication. Keep  the skin clean and dry. Take the Doxycycline  as directed. If you note any non-resolving, new, or worsening symptoms despite treatment, please seek an in-person evaluation ASAP.   If you have been instructed to have an in-person evaluation today at a local Urgent Care facility, please use the link below. It will take you to a list of all of our available [facility], including address, phone number and hours of operation. Please do not delay care.  [facility]  If you or a family member do not have a primary care provider, use the link below to schedule a visit and establish care. When you choose a [facility] primary care physician or advanced practice provider, you gain a long-term partner in health. Find a Primary Care Provider  Learn more about [facility]'s in-office and virtual care options: [facility] - Get Care Now

## 2024-03-27 NOTE — Progress Notes (Signed)
 Virtual Visit Consent   Denise Arnold, you are scheduled for a virtual visit with a [facility] provider today. Just as with appointments in the office, your consent must be obtained to participate. Your consent will be active for this visit and any virtual visit you may have with one of our providers in the next 365 days. If you have a MyChart account, a copy of this consent can be sent to you electronically.  As this is a virtual visit, video technology does not allow for your provider to perform a traditional examination. This may limit your provider's ability to fully assess your condition. If your provider identifies any concerns that need to be evaluated in person or the need to arrange testing (such as labs, EKG, etc.), we will make arrangements to do so. Although advances in technology are sophisticated, we cannot ensure that it will always work on either your end or our end. If the connection with a video visit is poor, the visit may have to be switched to a telephone visit. With either a video or telephone visit, we are not always able to ensure that we have a secure connection.  By engaging in this virtual visit, you consent to the provision of healthcare and authorize for your insurance to be billed (if applicable) for the services provided during this visit. Depending on your insurance coverage, you may receive a charge related to this service.  I need to obtain your verbal consent now. Are you willing to proceed with your visit today? Denise Arnold has provided verbal consent on 03/27/2024 for a virtual visit (video or telephone). Denise Arnold, [place]  Date: 03/27/2024 5:55 PM   Virtual Visit via Video Note   I, Denise Arnold, connected with  Denise Arnold  ([id number], [birth year]) on 03/27/24 at  6:45 PM EDT by a video-enabled telemedicine application and verified that I am speaking with the correct person using two identifiers.  Location: Patient: Virtual Visit Location  Patient: Home Provider: Virtual Visit Location Provider: Home Office   I discussed the limitations of evaluation and management by telemedicine and the availability of in person appointments. The patient expressed understanding and agreed to proceed.    History of Present Illness: Denise Arnold is a 63 y.o. who identifies as a female who was assigned female at birth, and is being seen today for increased redness, swelling and pain at site of chronic skin ulcer of R index finger, secondary to her scleroderma. Notes she typically manages with her regular medication and with mild irritation, adding on topical Mupirocin . Notes increased redness and pain over past 2 days. Denies fever or chills. Unable to get in with her Dermatologist this week. Notes typically has to take a course of Doxycycline .  HPI: HPI  Problems:  Patient Active Problem List   Diagnosis Date Noted   Other dysphagia 11/17/2023   Hiatal hernia 11/17/2023   Gastric polyps 11/17/2023   PAD (peripheral artery disease) (HCC) 08/17/2023   Leg cramps 04/14/2023   Left shoulder pain 03/19/2023   SOB (shortness of breath) 01/30/2023   Absent pulse 01/30/2023   Hypercholesteremia 01/25/2023   Abdominal pain 10/24/2022   Knee pain, left 06/17/2022   Finger ulcer (HCC) 01/16/2022   Breast pain, left 01/16/2022   Urinary urgency 10/11/2021   Headache 06/22/2021   Anxiety 06/06/2021   Palpitations 05/28/2021   Diarrhea 05/25/2021   Fatigue 05/25/2021   Axillary fullness 01/13/2019   Cellulitis 01/16/2018   Localized  swelling, mass and lump, upper limb 01/12/2018   Health care maintenance 01/25/2015   History of colonic polyps 04/22/2013   Vitamin D  deficiency 01/15/2013   Degenerative cervical disc 01/15/2013   GERD (gastroesophageal reflux disease) 01/15/2013   Scleroderma (HCC) 07/17/2012   Raynauds phenomenon 07/17/2012    Allergies:  Allergies  Allergen Reactions   Sulfa Antibiotics Nausea And Vomiting and Nausea  Only   Keflex  [Cephalexin ] Nausea Only   Nirmatrelvir-Ritonavir Diarrhea and Palpitations   Penicillins Other (See Comments) and Rash   Medications:  Current Outpatient Medications:    doxycycline  (VIBRA -TABS) 100 MG tablet, Take 1 tablet (100 mg total) by mouth 2 (two) times daily., Disp: 20 tablet, Rfl: 0   aspirin 81 MG chewable tablet, Chew 81 mg by mouth daily., Disp: , Rfl:    azelastine  (ASTELIN ) 0.1 % nasal spray, PLACE 1 SPRAY INTO BOTH NOSTRILS 2 (TWO) TIMES DAILY. USE IN EACH NOSTRIL AS DIRECTED, Disp: 30 mL, Rfl: 1   Biotin 5000 MCG TABS, , Disp: , Rfl:    busPIRone  (BUSPAR ) 7.5 MG tablet, Take 1 tablet (7.5 mg total) by mouth 3 (three) times daily., Disp: 270 tablet, Rfl: 1   Calcium-Magnesium-Vitamin D  (CALCIUM 500 PO), Take by mouth., Disp: , Rfl:    Cholecalciferol (VITAMIN D ) 50 MCG (2000 UT) tablet, Take 2,000 Units by mouth daily., Disp: , Rfl:    ciclopirox  (LOPROX ) 0.77 % cream, Apply topically daily., Disp: 30 g, Rfl: 0   clobetasol  cream (TEMOVATE ) 0.05 %, Apply to elbows twice a day as needed. Avoid face, groin, axilla., Disp: 30 g, Rfl: 0   Cobalamin Combinations (B-12) [phone] MCG SUBL, daily., Disp: , Rfl:    Cranberry 400 MG CAPS, Take by mouth., Disp: , Rfl:    famotidine  (PEPCID ) 20 MG tablet, Take 1 tablet (20 mg total) by mouth at bedtime., Disp: 30 tablet, Rfl: 0   fish oil-omega-3 fatty acids 1000 MG capsule, Take 1 g by mouth daily., Disp: , Rfl:    fluconazole  (DIFLUCAN ) 150 MG tablet, Take 1 tablet (150 mg total) by mouth every 3 (three) days as needed., Disp: 2 tablet, Rfl: 0   gabapentin  (NEURONTIN ) 300 MG capsule, Take 1 capsule (300 mg total) by mouth at bedtime., Disp: 90 capsule, Rfl: 1   Glucosamine-Chondroitin 1500-1200 MG/30ML LIQD, , Disp: , Rfl:    Magnesium 250 MG TABS, , Disp: , Rfl:    MISC NATURAL PRODUCTS PO, Herbal Supplement, Disp: , Rfl:    Multiple Vitamin (MULTIVITAMIN) tablet, Take 1 tablet by mouth daily., Disp: , Rfl:     mupirocin  ointment (BACTROBAN ) 2 %, Apply topically 3 (three) times daily. Left elbow, Disp: 30 g, Rfl: 0   pantoprazole  (PROTONIX ) 40 MG tablet, Take 1 tablet (40 mg total) by mouth 2 (two) times daily before a meal., Disp: 60 tablet, Rfl: 2   Probiotic Product (PROBIOTIC PO), Take by mouth., Disp: , Rfl:    RYALTRIS 665-25 MCG/ACT SUSP, INSTILL 2 SPRAYS IN EACH NOSTRIL TWICE DAILY, Disp: , Rfl:    SANTYL 250 UNIT/GM ointment, Apply 1 Application topically daily., Disp: , Rfl:    sildenafil (REVATIO) 20 MG tablet, Take 20 mg by mouth 2 (two) times daily., Disp: , Rfl:    tacrolimus  (PROTOPIC ) 0.1 % ointment, APPLY TO AFFECTED AREAS ONCE TO TWICE A DAY., Disp: 60 g, Rfl: 1   Tavaborole  5 % SOLN, APPLY TO AFFECTED TOENAIL EVERY NIGHT UNTIL IMPROVED., Disp: 10 mL, Rfl: 2  Observations/Objective: Patient is well-developed, well-nourished  in no acute distress.  Resting comfortably  at home.  Head is normocephalic, atraumatic.  No labored breathing.  Speech is clear and coherent with logical content.  Patient is alert and oriented at baseline.  Small 3-4 mm superficial ulcer at the tip of R index finger. Some surrounding erythema and mild swelling without visible fluctuance. Normal ROM at PIP and DIP.   Assessment and Plan: 1. Cellulitis of right index finger (Primary) - doxycycline  (VIBRA -TABS) 100 MG tablet; Take 1 tablet (100 mg total) by mouth 2 (two) times daily.  Dispense: 20 tablet; Refill: 0  2. Scleroderma (HCC)  3. Skin ulcer of finger, limited to breakdown of skin (HCC)  Supportive measures and OTC medications reviewed. Doxycycline  per orders. Call Dermatology for follow-up. Has routine follow-up with PCP in 2 weeks. Keep as scheduled.   Follow Up Instructions: I discussed the assessment and treatment plan with the patient. The patient was provided an opportunity to ask questions and all were answered. The patient agreed with the plan and demonstrated an understanding of the  instructions.  A copy of instructions were sent to the patient via MyChart unless otherwise noted below.   The patient was advised to call back or seek an in-person evaluation if the symptoms worsen or if the condition fails to improve as anticipated.    Denise Velma Lunger, PA-C

## 2024-03-30 ENCOUNTER — Other Ambulatory Visit: Payer: Self-pay | Admitting: Internal Medicine

## 2024-04-03 ENCOUNTER — Other Ambulatory Visit

## 2024-04-05 ENCOUNTER — Ambulatory Visit: Payer: Self-pay | Admitting: Internal Medicine

## 2024-04-05 ENCOUNTER — Other Ambulatory Visit (INDEPENDENT_AMBULATORY_CARE_PROVIDER_SITE_OTHER)

## 2024-04-05 DIAGNOSIS — M349 Systemic sclerosis, unspecified: Secondary | ICD-10-CM

## 2024-04-05 DIAGNOSIS — E7800 Pure hypercholesterolemia, unspecified: Secondary | ICD-10-CM | POA: Diagnosis not present

## 2024-04-05 LAB — LIPID PANEL
Cholesterol: 187 mg/dL (ref 0–200)
HDL: 55.7 mg/dL (ref >39.00)
LDL Cholesterol: 108 mg/dL — ABNORMAL HIGH (ref 0–99)
NonHDL: 131.14
Total CHOL/HDL Ratio: 3
Triglycerides: 118 mg/dL (ref 0.0–149.0)
VLDL: 23.6 mg/dL (ref 0.0–40.0)

## 2024-04-05 LAB — BASIC METABOLIC PANEL WITH GFR
BUN: 13 mg/dL (ref 6–23)
CO2: 30 meq/L (ref 19–32)
Calcium: 9.4 mg/dL (ref 8.4–10.5)
Chloride: 101 meq/L (ref 96–112)
Creatinine, Ser: 1.01 mg/dL (ref 0.40–1.20)
GFR: 59.74 mL/min — ABNORMAL LOW (ref >60.00)
Glucose, Bld: 87 mg/dL (ref 70–99)
Potassium: 4.5 meq/L (ref 3.5–5.1)
Sodium: 137 meq/L (ref 135–145)

## 2024-04-05 LAB — HEPATIC FUNCTION PANEL
ALT: 10 U/L (ref 0–35)
AST: 16 U/L (ref 0–37)
Albumin: 4 g/dL (ref 3.5–5.2)
Alkaline Phosphatase: 49 U/L (ref 39–117)
Bilirubin, Direct: 0.2 mg/dL (ref 0.0–0.3)
Total Bilirubin: 0.7 mg/dL (ref 0.2–1.2)
Total Protein: 6.3 g/dL (ref 6.0–8.3)

## 2024-04-08 ENCOUNTER — Ambulatory Visit: Admitting: Internal Medicine

## 2024-04-08 VITALS — BP 126/70 | HR 71 | Resp 16 | Ht 64.5 in | Wt 156.0 lb

## 2024-04-08 DIAGNOSIS — M349 Systemic sclerosis, unspecified: Secondary | ICD-10-CM | POA: Diagnosis not present

## 2024-04-08 DIAGNOSIS — L98491 Non-pressure chronic ulcer of skin of other sites limited to breakdown of skin: Secondary | ICD-10-CM

## 2024-04-08 DIAGNOSIS — Z860100 Personal history of colon polyps, unspecified: Secondary | ICD-10-CM

## 2024-04-08 DIAGNOSIS — K2100 Gastro-esophageal reflux disease with esophagitis, without bleeding: Secondary | ICD-10-CM

## 2024-04-08 DIAGNOSIS — E7800 Pure hypercholesterolemia, unspecified: Secondary | ICD-10-CM | POA: Diagnosis not present

## 2024-04-08 DIAGNOSIS — E559 Vitamin D deficiency, unspecified: Secondary | ICD-10-CM

## 2024-04-08 DIAGNOSIS — I739 Peripheral vascular disease, unspecified: Secondary | ICD-10-CM

## 2024-04-08 DIAGNOSIS — R944 Abnormal results of kidney function studies: Secondary | ICD-10-CM

## 2024-04-08 DIAGNOSIS — F419 Anxiety disorder, unspecified: Secondary | ICD-10-CM

## 2024-04-08 NOTE — Progress Notes (Signed)
 Subjective:    Patient ID: Denise Arnold, female    DOB: [birth year], 63 y.o.   MRN: [id number]  Patient here for  Chief Complaint  Patient presents with   Medical Management of Chronic Issues    HPI Here for a scheduled follow up - follow up regarding increased stress, anxiety, acid reflux, hypercholesterolemia and scleroderma. Saw GI 11/29/23 - instructed to continue protonix  40mg  bid and pepcid  in the evening. Barium swallow - normal appearance of the esophagus and mild GERD. GI had discussed decreasing to q day PPI and taking two altoids before eating. Currently taking protonix  40mg  in the am and pepcid  q pm. Increased stress with recent labs. Had questions about her kidney function tests. Discussed labs. Discussed staying hydrated. Has been taking ibuprofen and aleve. Finger ulceration - doxycycline  - right index finger. Recent root canal and on doxycycline  - noticed decreased appetite.    Past Medical History:  Diagnosis Date   Allergy ?   Penicillin, sulpha drugs, Paxlovid,   Arthritis    GERD (gastroesophageal reflux disease)    History of endoscopy 04/05/2013   upper   Menorrhagia    Reynolds syndrome (HCC)    Scleroderma (HCC)    positive FANA, positive SCL-70 abs, raynauds, mild pulmonary hypertension, normal DLCO   Tricuspid regurgitation    Vitamin D  deficiency    Past Surgical History:  Procedure Laterality Date   CESAREAN SECTION  1994   COLONOSCOPY WITH PROPOFOL  N/A 05/29/2015   Procedure: COLONOSCOPY WITH PROPOFOL ;  Surgeon: Lamar ONEIDA Holmes, MD;  Location: [facility] ENDOSCOPY;  Service: Endoscopy;  Laterality: N/A;   DILATION AND CURETTAGE OF UTERUS  1992   DILATION AND CURETTAGE OF UTERUS  1993   DILATION AND CURETTAGE OF UTERUS  2011   VEIN SURGERY     Family History  Problem Relation Age of Onset   Heart disease Father        myocardial infarction   Arthritis/Rheumatoid Father    Hypertension Father    Arthritis Father    COPD Father    Hyperlipidemia  Mother    Breast cancer Maternal Grandmother    Cancer Maternal Grandmother    Colon cancer Neg Hx    Social History   Socioeconomic History   Marital status: Married    Spouse name: Not on file   Number of children: 1   Years of education: Not on file   Highest education level: Bachelor's degree (e.g., BA, AB, BS)  Occupational History   Not on file  Tobacco Use   Smoking status: Never   Smokeless tobacco: Never  Vaping Use   Vaping status: Never Used  Substance and Sexual Activity   Alcohol use: Yes    Comment: Occasionally   Drug use: No   Sexual activity: Yes    Birth control/protection: Condom  Other Topics Concern   Not on file  Social History Narrative   Not on file   Social Drivers of Health   Financial Resource Strain: Low Risk  (01/08/2024)   Overall Financial Resource Strain (CARDIA)    Difficulty of Paying Living Expenses: Not hard at all  Food Insecurity: No Food Insecurity (01/08/2024)   Hunger Vital Sign    Worried About Running Out of Food in the Last Year: Never true    Ran Out of Food in the Last Year: Never true  Transportation Needs: No Transportation Needs (01/08/2024)   PRAPARE - Administrator, Civil Service (Medical): No  Lack of Transportation (Non-Medical): No  Physical Activity: Sufficiently Active (01/08/2024)   Exercise Vital Sign    Days of Exercise per Week: 4 days    Minutes of Exercise per Session: 50 min  Stress: Stress Concern Present (01/08/2024)   [employer] of Occupational Health - Occupational Stress Questionnaire    Feeling of Stress: To some extent  Social Connections: Socially Integrated (01/08/2024)   Social Connection and Isolation Panel    Frequency of Communication with Friends and Family: More than three times a week    Frequency of Social Gatherings with Friends and Family: Twice a week    Attends Religious Services: More than 4 times per year    Active Member of [employer] or Organizations: Yes     Attends Engineer, structural: More than 4 times per year    Marital Status: Married     Review of Systems  Constitutional:  Negative for fever and unexpected weight change.       Some decreased appetite.   HENT:  Negative for congestion and sinus pressure.   Respiratory:  Negative for cough, chest tightness and shortness of breath.   Cardiovascular:  Negative for chest pain, palpitations and leg swelling.  Gastrointestinal:  Negative for abdominal pain, diarrhea and vomiting.  Genitourinary:  Negative for difficulty urinating and dysuria.  Musculoskeletal:  Negative for joint swelling and myalgias.  Skin:  Negative for color change and rash.       Healing ulceration - right index finger.   Neurological:  Negative for dizziness and headaches.  Psychiatric/Behavioral:         Increased stress and anxiety as outlined.        Objective:     BP 126/70   Pulse 71   Resp 16   Ht 5' 4.5 (1.638 m)   Wt 156 lb (70.8 kg)   LMP 02/14/2010   SpO2 98%   BMI 26.36 kg/m  Wt Readings from Last 3 Encounters:  04/08/24 156 lb (70.8 kg)  01/08/24 158 lb (71.7 kg)  11/27/23 157 lb (71.2 kg)    Physical Exam Vitals reviewed.  Constitutional:      General: She is not in acute distress.    Appearance: Normal appearance.  HENT:     Head: Normocephalic and atraumatic.     Right Ear: External ear normal.     Left Ear: External ear normal.     Mouth/Throat:     Pharynx: No oropharyngeal exudate or posterior oropharyngeal erythema.  Eyes:     General: No scleral icterus.       Right eye: No discharge.        Left eye: No discharge.     Conjunctiva/sclera: Conjunctivae normal.  Neck:     Thyroid : No thyromegaly.  Cardiovascular:     Rate and Rhythm: Normal rate and regular rhythm.  Pulmonary:     Effort: No respiratory distress.     Breath sounds: Normal breath sounds. No wheezing.  Abdominal:     General: Bowel sounds are normal.     Palpations: Abdomen is soft.      Tenderness: There is no abdominal tenderness.  Musculoskeletal:        General: No swelling or tenderness.     Cervical back: Neck supple. No tenderness.  Lymphadenopathy:     Cervical: No cervical adenopathy.  Skin:    Findings: No erythema or rash.     Comments: Healing ulceration - right index finger.   Neurological:  Mental Status: She is alert.  Psychiatric:        Mood and Affect: Mood normal.        Behavior: Behavior normal.         Outpatient Encounter Medications as of 04/08/2024  Medication Sig   busPIRone  (BUSPAR ) 7.5 MG tablet TAKE 1 TABLET BY MOUTH 2 TIMES DAILY. (Patient taking differently: Take 7.5 mg by mouth 3 (three) times daily.)   CRANBERRY PO Take 4,800 mg by mouth daily.   Cyanocobalamin (B-12 SL) Place 500 mcg under the tongue daily.   fish oil-omega-3 fatty acids 1000 MG capsule Take 1 g by mouth daily. (Patient taking differently: Take 500 mg by mouth daily.)   mupirocin  ointment (BACTROBAN ) 2 % Apply topically 3 (three) times daily. Left elbow (Patient taking differently: Apply topically 3 (three) times daily as needed. Left elbow)   pantoprazole  (PROTONIX ) 40 MG tablet Take 1 tablet (40 mg total) by mouth 2 (two) times daily before a meal. (Patient taking differently: Take 40 mg by mouth daily.)   aspirin 81 MG chewable tablet Chew 81 mg by mouth daily.   azelastine  (ASTELIN ) 0.1 % nasal spray PLACE 1 SPRAY INTO BOTH NOSTRILS 2 (TWO) TIMES DAILY. USE IN EACH NOSTRIL AS DIRECTED   Biotin 5000 MCG TABS    Calcium-Magnesium-Vitamin D  (CALCIUM 500 PO) Take by mouth.   Cholecalciferol (VITAMIN D ) 50 MCG (2000 UT) tablet Take 2,000 Units by mouth daily.   ciclopirox  (LOPROX ) 0.77 % cream Apply topically daily.   clobetasol  cream (TEMOVATE ) 0.05 % Apply to elbows twice a day as needed. Avoid face, groin, axilla.   famotidine  (PEPCID ) 20 MG tablet Take 1 tablet (20 mg total) by mouth at bedtime.   gabapentin  (NEURONTIN ) 300 MG capsule Take 1 capsule (300 mg  total) by mouth at bedtime.   Glucosamine-Chondroitin 1500-1200 MG/30ML LIQD    Magnesium 250 MG TABS    MISC NATURAL PRODUCTS PO Herbal Supplement   Multiple Vitamin (MULTIVITAMIN) tablet Take 1 tablet by mouth daily.   Probiotic Product (PROBIOTIC PO) Take by mouth.   SANTYL 250 UNIT/GM ointment Apply 1 Application topically daily.   sildenafil (REVATIO) 20 MG tablet Take 20 mg by mouth 2 (two) times daily.   tacrolimus  (PROTOPIC ) 0.1 % ointment APPLY TO AFFECTED AREAS ONCE TO TWICE A DAY.   Tavaborole  5 % SOLN APPLY TO AFFECTED TOENAIL EVERY NIGHT UNTIL IMPROVED.   [DISCONTINUED] Cobalamin Combinations (B-12) [phone] MCG SUBL daily.   [DISCONTINUED] Cranberry 400 MG CAPS Take by mouth.   [DISCONTINUED] doxycycline  (VIBRA -TABS) 100 MG tablet Take 1 tablet (100 mg total) by mouth 2 (two) times daily.   [DISCONTINUED] fluconazole  (DIFLUCAN ) 150 MG tablet Take 1 tablet (150 mg total) by mouth every 3 (three) days as needed.   [DISCONTINUED] RYALTRIS 665-25 MCG/ACT SUSP INSTILL 2 SPRAYS IN EACH NOSTRIL TWICE DAILY   No facility-administered encounter medications on file as of 04/08/2024.     Lab Results  Component Value Date   WBC 4.9 09/11/2023   HGB 13.4 09/11/2023   HCT 40.1 09/11/2023   PLT 205.0 09/11/2023   GLUCOSE 118 (H) 04/08/2024   CHOL 187 04/05/2024   TRIG 118.0 04/05/2024   HDL 55.70 04/05/2024   LDLDIRECT 113.0 10/24/2022   LDLCALC 108 (H) 04/05/2024   ALT 10 04/05/2024   AST 16 04/05/2024   NA 138 04/08/2024   K 4.0 04/08/2024   CL 102 04/08/2024   CREATININE 0.89 04/08/2024   BUN 10 04/08/2024   CO2  27 04/08/2024   TSH 2.20 01/03/2024    MM 3D SCREENING MAMMOGRAM BILATERAL BREAST Result Date: 02/12/2024 CLINICAL DATA:  Screening. EXAM: DIGITAL SCREENING BILATERAL MAMMOGRAM WITH TOMOSYNTHESIS AND CAD TECHNIQUE: Bilateral screening digital craniocaudal and mediolateral oblique mammograms were obtained. Bilateral screening digital breast tomosynthesis was  performed. The images were evaluated with computer-aided detection. COMPARISON:  Previous exam(s). ACR Breast Density Category b: There are scattered areas of fibroglandular density. FINDINGS: There are no findings suspicious for malignancy. IMPRESSION: No mammographic evidence of malignancy. A result letter of this screening mammogram will be mailed directly to the patient. RECOMMENDATION: Screening mammogram in one year. (Code:SM-B-01Y) BI-RADS CATEGORY  1: Negative. Electronically Signed   By: Reyes Phi M.D.   On: 02/12/2024 08:44       Assessment & Plan:  Decreased GFR Assessment & Plan:  Increased stress and anxiety related to recent labs and decrease in GFR. She has had decreased appetite with root canal and being on doxycycline . Also was taking ibuprofen/aleve. Discussed above. Discussed staying hydrated. Recheck met b today.   Orders: -     Basic metabolic panel with GFR; Future  Scleroderma (HCC) Assessment & Plan: Followed by rheumatology. Seeing Dr Tobie and Dr Loreli [facility]). Recent echo ok. Finger tips ok. Recent treatment with doxycycline  as outlined. Seeing GI for swallowing issues and reflux. Continue PPI. Continue f/u with rheumatology.   Orders: -     Basic metabolic panel with GFR; Future  Hypercholesteremia Assessment & Plan: The 10-year ASCVD risk score (Arnett DK, et al., 2019) is: 3.7%   Values used to calculate the score:     Age: 43 years     Clincally relevant sex: Female     Is Non-Hispanic African American: No     Diabetic: No     Tobacco smoker: No     Systolic Blood Pressure: 126 mmHg     Is BP treated: No     HDL Cholesterol: 55.7 mg/dL     Total Cholesterol: 187 mg/dL  Low cholesterol diet and exercise. Follow lipid panel.   Orders: -     Basic metabolic panel with GFR; Future -     Hepatic function panel; Future -     Lipid panel; Future  Anxiety Assessment & Plan: Curently on buspar  7.5mg  tid.  Increased stress and anxiety related to recent  labs and decrease in GFR. She has had decreased appetite with root canal and being on doxycycline . Also was taking ibuprofen/aleve. Discussed above. Discussed staying hydrated. Recheck met b today. Hold on changing medication at this time.    Skin ulcer of finger, limited to breakdown of skin Assessment & Plan: Improved. Recent treatment - doxycycline .    Gastroesophageal reflux disease with esophagitis without hemorrhage Assessment & Plan: Saw GI as outlined. Barium swallow as outlined. Continue PPI. Currently on protoix 40mg  q am and pepcid  q pm.    History of colonic polyps Assessment & Plan: Colonoscopy 03/2021.    Vitamin D  deficiency Assessment & Plan: Vitamin D  level 08/2023 - wnl.    PAD (peripheral artery disease) Assessment & Plan: Saw vascular 08/16/23 - noninvasive studies showed improvement.  Recommended f/u in one year.       Allena Hamilton, MD

## 2024-04-09 ENCOUNTER — Ambulatory Visit: Payer: Self-pay | Admitting: Internal Medicine

## 2024-04-09 LAB — BASIC METABOLIC PANEL WITH GFR
BUN: 10 mg/dL (ref 6–23)
CO2: 27 meq/L (ref 19–32)
Calcium: 9.4 mg/dL (ref 8.4–10.5)
Chloride: 102 meq/L (ref 96–112)
Creatinine, Ser: 0.89 mg/dL (ref 0.40–1.20)
GFR: 69.52 mL/min (ref >60.00)
Glucose, Bld: 118 mg/dL — ABNORMAL HIGH (ref 70–99)
Potassium: 4 meq/L (ref 3.5–5.1)
Sodium: 138 meq/L (ref 135–145)

## 2024-04-14 ENCOUNTER — Encounter: Payer: Self-pay | Admitting: Internal Medicine

## 2024-04-14 NOTE — Assessment & Plan Note (Signed)
 Followed by rheumatology. Seeing Dr Tobie and Dr Loreli [facility]). Recent echo ok. Finger tips ok. Recent treatment with doxycycline  as outlined. Seeing GI for swallowing issues and reflux. Continue PPI. Continue f/u with rheumatology.

## 2024-04-14 NOTE — Assessment & Plan Note (Signed)
 Improved. Recent treatment - doxycycline .

## 2024-04-14 NOTE — Assessment & Plan Note (Signed)
 Saw vascular 08/16/23 - noninvasive studies showed improvement. Recommended f/u in one year.

## 2024-04-14 NOTE — Assessment & Plan Note (Signed)
 Vitamin D  level 08/2023 - wnl.

## 2024-04-14 NOTE — Assessment & Plan Note (Signed)
 Saw GI as outlined. Barium swallow as outlined. Continue PPI. Currently on protoix 40mg  q am and pepcid  q pm.

## 2024-04-14 NOTE — Assessment & Plan Note (Signed)
Colonoscopy 03/2021.  

## 2024-04-14 NOTE — Assessment & Plan Note (Signed)
 Increased stress and anxiety related to recent labs and decrease in GFR. She has had decreased appetite with root canal and being on doxycycline . Also was taking ibuprofen/aleve. Discussed above. Discussed staying hydrated. Recheck met b today.

## 2024-04-14 NOTE — Assessment & Plan Note (Signed)
 Curently on buspar  7.5mg  tid.  Increased stress and anxiety related to recent labs and decrease in GFR. She has had decreased appetite with root canal and being on doxycycline . Also was taking ibuprofen/aleve. Discussed above. Discussed staying hydrated. Recheck met b today. Hold on changing medication at this time.

## 2024-04-14 NOTE — Assessment & Plan Note (Signed)
 The 10-year ASCVD risk score (Arnett DK, et al., 2019) is: 3.7%   Values used to calculate the score:     Age: 63 years     Clincally relevant sex: Female     Is Non-Hispanic African American: No     Diabetic: No     Tobacco smoker: No     Systolic Blood Pressure: 126 mmHg     Is BP treated: No     HDL Cholesterol: 55.7 mg/dL     Total Cholesterol: 187 mg/dL  Low cholesterol diet and exercise. Follow lipid panel.

## 2024-04-24 ENCOUNTER — Ambulatory Visit: Admitting: Internal Medicine

## 2024-04-25 ENCOUNTER — Encounter: Payer: Self-pay | Admitting: Internal Medicine

## 2024-04-25 ENCOUNTER — Ambulatory Visit: Admitting: Internal Medicine

## 2024-04-25 VITALS — BP 114/70 | HR 73 | Resp 16 | Ht 64.5 in | Wt 153.0 lb

## 2024-04-25 DIAGNOSIS — Z860100 Personal history of colon polyps, unspecified: Secondary | ICD-10-CM

## 2024-04-25 DIAGNOSIS — Z23 Encounter for immunization: Secondary | ICD-10-CM

## 2024-04-25 DIAGNOSIS — L03114 Cellulitis of left upper limb: Secondary | ICD-10-CM

## 2024-04-25 DIAGNOSIS — T148XXA Other injury of unspecified body region, initial encounter: Secondary | ICD-10-CM

## 2024-04-25 DIAGNOSIS — R944 Abnormal results of kidney function studies: Secondary | ICD-10-CM | POA: Diagnosis not present

## 2024-04-25 DIAGNOSIS — F419 Anxiety disorder, unspecified: Secondary | ICD-10-CM

## 2024-04-25 DIAGNOSIS — K2100 Gastro-esophageal reflux disease with esophagitis, without bleeding: Secondary | ICD-10-CM

## 2024-04-25 DIAGNOSIS — I739 Peripheral vascular disease, unspecified: Secondary | ICD-10-CM

## 2024-04-25 DIAGNOSIS — E7800 Pure hypercholesterolemia, unspecified: Secondary | ICD-10-CM

## 2024-04-25 DIAGNOSIS — M349 Systemic sclerosis, unspecified: Secondary | ICD-10-CM

## 2024-04-25 DIAGNOSIS — I7300 Raynaud's syndrome without gangrene: Secondary | ICD-10-CM

## 2024-04-25 DIAGNOSIS — E559 Vitamin D deficiency, unspecified: Secondary | ICD-10-CM | POA: Diagnosis not present

## 2024-04-25 LAB — BASIC METABOLIC PANEL WITH GFR
BUN: 10 mg/dL (ref 6–23)
CO2: 30 meq/L (ref 19–32)
Calcium: 9.3 mg/dL (ref 8.4–10.5)
Chloride: 101 meq/L (ref 96–112)
Creatinine, Ser: 0.92 mg/dL (ref 0.40–1.20)
GFR: 66.79 mL/min (ref >60.00)
Glucose, Bld: 95 mg/dL (ref 70–99)
Potassium: 4.6 meq/L (ref 3.5–5.1)
Sodium: 136 meq/L (ref 135–145)

## 2024-04-25 MED ORDER — ESOMEPRAZOLE MAGNESIUM 40 MG PO CPDR: 40.0000 mg | DELAYED_RELEASE_CAPSULE | Freq: Every day | ORAL | 1 refills | Status: DC

## 2024-04-25 MED ORDER — MUPIROCIN 2 % EX OINT: TOPICAL_OINTMENT | Freq: Three times a day (TID) | CUTANEOUS | 0 refills | Status: AC

## 2024-04-25 NOTE — Telephone Encounter (Signed)
 Rx for nexium sent to [facility].

## 2024-04-25 NOTE — Progress Notes (Signed)
 Subjective:    Patient ID: Denise Arnold, female    DOB: [birth year], 62 y.o.   MRN: [id number]  Patient here for  Chief Complaint  Patient presents with   Medical Management of Chronic Issues    HPI Here for a scheduled follow up - follow up regarding increased stress, anxiety, acid reflux, hypercholesterolemia and scleroderma.  Saw GI 11/29/23 - instructed to continue protonix  40mg  bid and pepcid  in the evening. Barium swallow - normal appearance of the esophagus and mild GERD. GI had discussed decreasing to q day PPI and taking two altoids before eating. Currently taking protonix  40mg  in the am and pepcid  q pm. Last visit, concerned regarding decreased GFR. F/u GFR after visit - improved - 69. She expressed concern regarding GFR and PPI use. Dicussed need to control upper symptoms. Modifying diet. Stays active. Still acid reflux. No abdominal pain. Bowels moving. History of finger ulceration - finger tips. Overall improved. [facility] refill - bactroban .    Past Medical History:  Diagnosis Date   Allergy ?   Penicillin, sulpha drugs, Paxlovid,   Arthritis    GERD (gastroesophageal reflux disease)    History of endoscopy 04/05/2013   upper   Menorrhagia    Reynolds syndrome (HCC)    Scleroderma (HCC)    positive FANA, positive SCL-70 abs, raynauds, mild pulmonary hypertension, normal DLCO   Tricuspid regurgitation    Vitamin D  deficiency    Past Surgical History:  Procedure Laterality Date   CESAREAN SECTION  1994   COLONOSCOPY WITH PROPOFOL  N/A 05/29/2015   Procedure: COLONOSCOPY WITH PROPOFOL ;  Surgeon: Lamar ONEIDA Holmes, MD;  Location: [facility] ENDOSCOPY;  Service: Endoscopy;  Laterality: N/A;   DILATION AND CURETTAGE OF UTERUS  1992   DILATION AND CURETTAGE OF UTERUS  1993   DILATION AND CURETTAGE OF UTERUS  2011   VEIN SURGERY     Family History  Problem Relation Age of Onset   Heart disease Father        myocardial infarction   Arthritis/Rheumatoid Father    Hypertension  Father    Arthritis Father    COPD Father    Hyperlipidemia Mother    Breast cancer Maternal Grandmother    Cancer Maternal Grandmother    Colon cancer Neg Hx    Social History   Socioeconomic History   Marital status: Married    Spouse name: Not on file   Number of children: 1   Years of education: Not on file   Highest education level: Bachelor's degree (e.g., BA, AB, BS)  Occupational History   Not on file  Tobacco Use   Smoking status: Never   Smokeless tobacco: Never  Vaping Use   Vaping status: Never Used  Substance and Sexual Activity   Alcohol use: Yes    Comment: Occasionally   Drug use: No   Sexual activity: Yes    Birth control/protection: Condom  Other Topics Concern   Not on file  Social History Narrative   Not on file   Social Drivers of Health   Financial Resource Strain: Low Risk  (01/08/2024)   Overall Financial Resource Strain (CARDIA)    Difficulty of Paying Living Expenses: Not hard at all  Food Insecurity: No Food Insecurity (01/08/2024)   Hunger Vital Sign    Worried About Running Out of Food in the Last Year: Never true    Ran Out of Food in the Last Year: Never true  Transportation Needs: No Transportation Needs (01/08/2024)  PRAPARE - Administrator, Civil Service (Medical): No    Lack of Transportation (Non-Medical): No  Physical Activity: Sufficiently Active (01/08/2024)   Exercise Vital Sign    Days of Exercise per Week: 4 days    Minutes of Exercise per Session: 50 min  Stress: Stress Concern Present (01/08/2024)   [employer] of Occupational Health - Occupational Stress Questionnaire    Feeling of Stress: To some extent  Social Connections: Socially Integrated (01/08/2024)   Social Connection and Isolation Panel    Frequency of Communication with Friends and Family: More than three times a week    Frequency of Social Gatherings with Friends and Family: Twice a week    Attends Religious Services: More than 4 times per  year    Active Member of [employer] or Organizations: Yes    Attends Engineer, structural: More than 4 times per year    Marital Status: Married     Review of Systems  Constitutional:  Negative for appetite change and unexpected weight change.  HENT:  Negative for congestion and sinus pressure.   Respiratory:  Negative for cough, chest tightness and shortness of breath.   Cardiovascular:  Negative for chest pain and palpitations.  Gastrointestinal:  Negative for abdominal pain, nausea and vomiting.       Reflux as outlined.   Genitourinary:  Negative for difficulty urinating and dysuria.  Musculoskeletal:  Negative for joint swelling and myalgias.  Skin:  Negative for color change and rash.  Neurological:  Negative for dizziness and headaches.  Psychiatric/Behavioral:  Negative for agitation and dysphoric mood.        Objective:     BP 114/70   Pulse 73   Resp 16   Ht 5' 4.5 (1.638 m)   Wt 153 lb (69.4 kg)   LMP 02/14/2010   SpO2 98%   BMI 25.86 kg/m  Wt Readings from Last 3 Encounters:  04/25/24 153 lb (69.4 kg)  04/08/24 156 lb (70.8 kg)  01/08/24 158 lb (71.7 kg)    Physical Exam Vitals reviewed.  Constitutional:      General: She is not in acute distress.    Appearance: Normal appearance.  HENT:     Head: Normocephalic and atraumatic.     Right Ear: External ear normal.     Left Ear: External ear normal.     Mouth/Throat:     Pharynx: No oropharyngeal exudate or posterior oropharyngeal erythema.  Eyes:     General: No scleral icterus.       Right eye: No discharge.        Left eye: No discharge.     Conjunctiva/sclera: Conjunctivae normal.  Neck:     Thyroid : No thyromegaly.  Cardiovascular:     Rate and Rhythm: Normal rate and regular rhythm.  Pulmonary:     Effort: No respiratory distress.     Breath sounds: Normal breath sounds. No wheezing.  Abdominal:     General: Bowel sounds are normal.     Palpations: Abdomen is soft.     Tenderness:  There is no abdominal tenderness.  Musculoskeletal:        General: No swelling or tenderness.     Cervical back: Neck supple. No tenderness.  Lymphadenopathy:     Cervical: No cervical adenopathy.  Skin:    Findings: No erythema or rash.  Neurological:     Mental Status: She is alert.  Psychiatric:        Mood and  Affect: Mood normal.        Behavior: Behavior normal.         Outpatient Encounter Medications as of 04/25/2024  Medication Sig   aspirin 81 MG chewable tablet Chew 81 mg by mouth daily.   azelastine  (ASTELIN ) 0.1 % nasal spray PLACE 1 SPRAY INTO BOTH NOSTRILS 2 (TWO) TIMES DAILY. USE IN EACH NOSTRIL AS DIRECTED   Biotin 5000 MCG TABS    busPIRone  (BUSPAR ) 7.5 MG tablet TAKE 1 TABLET BY MOUTH 2 TIMES DAILY. (Patient taking differently: Take 7.5 mg by mouth 3 (three) times daily.)   Calcium-Magnesium-Vitamin D  (CALCIUM 500 PO) Take by mouth.   Cholecalciferol (VITAMIN D ) 50 MCG (2000 UT) tablet Take 2,000 Units by mouth daily.   ciclopirox  (LOPROX ) 0.77 % cream Apply topically daily.   clobetasol  cream (TEMOVATE ) 0.05 % Apply to elbows twice a day as needed. Avoid face, groin, axilla.   CRANBERRY PO Take 4,800 mg by mouth daily.   Cyanocobalamin (B-12 SL) Place 500 mcg under the tongue daily.   famotidine  (PEPCID ) 20 MG tablet Take 1 tablet (20 mg total) by mouth at bedtime.   fish oil-omega-3 fatty acids 1000 MG capsule Take 1 g by mouth daily. (Patient taking differently: Take 500 mg by mouth daily.)   gabapentin  (NEURONTIN ) 300 MG capsule Take 1 capsule (300 mg total) by mouth at bedtime.   Glucosamine-Chondroitin 1500-1200 MG/30ML LIQD    Magnesium 250 MG TABS    MISC NATURAL PRODUCTS PO Herbal Supplement   Multiple Vitamin (MULTIVITAMIN) tablet Take 1 tablet by mouth daily.   mupirocin  ointment (BACTROBAN ) 2 % Apply topically 3 (three) times daily.   Probiotic Product (PROBIOTIC PO) Take by mouth.   SANTYL 250 UNIT/GM ointment Apply 1 Application topically  daily.   sildenafil (REVATIO) 20 MG tablet Take 20 mg by mouth 2 (two) times daily.   tacrolimus  (PROTOPIC ) 0.1 % ointment APPLY TO AFFECTED AREAS ONCE TO TWICE A DAY.   Tavaborole  5 % SOLN APPLY TO AFFECTED TOENAIL EVERY NIGHT UNTIL IMPROVED.   [DISCONTINUED] mupirocin  ointment (BACTROBAN ) 2 % Apply topically 3 (three) times daily. Left elbow (Patient taking differently: Apply topically 3 (three) times daily as needed. Left elbow)   [DISCONTINUED] pantoprazole  (PROTONIX ) 40 MG tablet Take 1 tablet (40 mg total) by mouth 2 (two) times daily before a meal. (Patient taking differently: Take 40 mg by mouth daily.)   No facility-administered encounter medications on file as of 04/25/2024.     Lab Results  Component Value Date   WBC 4.9 09/11/2023   HGB 13.4 09/11/2023   HCT 40.1 09/11/2023   PLT 205.0 09/11/2023   GLUCOSE 95 04/25/2024   CHOL 187 04/05/2024   TRIG 118.0 04/05/2024   HDL 55.70 04/05/2024   LDLDIRECT 113.0 10/24/2022   LDLCALC 108 (H) 04/05/2024   ALT 10 04/05/2024   AST 16 04/05/2024   NA 136 04/25/2024   K 4.6 04/25/2024   CL 101 04/25/2024   CREATININE 0.92 04/25/2024   BUN 10 04/25/2024   CO2 30 04/25/2024   TSH 2.20 01/03/2024    MM 3D SCREENING MAMMOGRAM BILATERAL BREAST Result Date: 02/12/2024 CLINICAL DATA:  Screening. EXAM: DIGITAL SCREENING BILATERAL MAMMOGRAM WITH TOMOSYNTHESIS AND CAD TECHNIQUE: Bilateral screening digital craniocaudal and mediolateral oblique mammograms were obtained. Bilateral screening digital breast tomosynthesis was performed. The images were evaluated with computer-aided detection. COMPARISON:  Previous exam(s). ACR Breast Density Category b: There are scattered areas of fibroglandular density. FINDINGS: There are no findings suspicious  for malignancy. IMPRESSION: No mammographic evidence of malignancy. A result letter of this screening mammogram will be mailed directly to the patient. RECOMMENDATION: Screening mammogram in one year.  (Code:SM-B-01Y) BI-RADS CATEGORY  1: Negative. Electronically Signed   By: Reyes Phi M.D.   On: 02/12/2024 08:44       Assessment & Plan:  Decreased GFR Assessment & Plan:  Increased stress and anxiety related to recent labs and decrease in GFR. Discussed staying hydrated. Continue to avoid antiinflammatory medications. Continue PPI as discussed. Request repeat GFR testing today.   Orders: -     Basic metabolic panel with GFR  Open wound -     Mupirocin ; Apply topically 3 (three) times daily.  Dispense: 30 g; Refill: 0  Cellulitis of left elbow -     Mupirocin ; Apply topically 3 (three) times daily.  Dispense: 30 g; Refill: 0  Need for influenza vaccination -     Flu vaccine trivalent PF, 6mos and older(Flulaval,Afluria,Fluarix,Fluzone)  Vitamin D  deficiency Assessment & Plan: Vitamin D  level 08/2023 - wnl.    Scleroderma (HCC) Assessment & Plan: Followed by rheumatology. Seeing Dr Tobie and Dr Loreli [facility]). Recent echo ok. Finger tips ok. Refill bactroban .  Seeing GI for swallowing issues and reflux. Continue PPI. Discussed taking nexium and two pepcid  as directed. Follow. Call with update. Continue f/u with rheumatology.    Raynaud's phenomenon without gangrene Assessment & Plan: On sildenafill.  Seeing rheumatology and dermatology as outlined.    PAD (peripheral artery disease) Assessment & Plan: Saw vascular 08/16/23 - noninvasive studies showed improvement.  Recommended f/u in one year.    Hypercholesteremia Assessment & Plan: The 10-year ASCVD risk score (Arnett DK, et al., 2019) is: 3.1%   Values used to calculate the score:     Age: 70 years     Clincally relevant sex: Female     Is Non-Hispanic African American: No     Diabetic: No     Tobacco smoker: No     Systolic Blood Pressure: 114 mmHg     Is BP treated: No     HDL Cholesterol: 55.7 mg/dL     Total Cholesterol: 187 mg/dL Low cholesterol diet and exercise. Follow lipid panel.    History of  colonic polyps Assessment & Plan: Colonoscopy 03/2021.    Gastroesophageal reflux disease with esophagitis without hemorrhage Assessment & Plan: Seeing GI. Barium swallow as outlined. Continue PPI. Currently on protoix 40mg  q am and pepcid  q pm.    Anxiety Assessment & Plan: Continue buspar . Overall stable.       Allena Hamilton, MD

## 2024-04-26 ENCOUNTER — Ambulatory Visit: Payer: Self-pay | Admitting: Internal Medicine

## 2024-05-01 ENCOUNTER — Encounter: Payer: Self-pay | Admitting: Internal Medicine

## 2024-05-02 NOTE — Telephone Encounter (Signed)
 Please call and let her know that I called GI. They are going to have someone call her and see about working her into the schedule.

## 2024-05-05 ENCOUNTER — Encounter: Payer: Self-pay | Admitting: Internal Medicine

## 2024-05-05 NOTE — Assessment & Plan Note (Signed)
 Seeing GI. Barium swallow as outlined. Continue PPI. Currently on protoix 40mg  q am and pepcid  q pm.

## 2024-05-05 NOTE — Assessment & Plan Note (Signed)
On sildenafill.  Seeing rheumatology and dermatology as outlined.

## 2024-05-05 NOTE — Assessment & Plan Note (Signed)
Colonoscopy 03/2021.  

## 2024-05-05 NOTE — Assessment & Plan Note (Signed)
 The 10-year ASCVD risk score (Arnett DK, et al., 2019) is: 3.1%   Values used to calculate the score:     Age: 63 years     Clincally relevant sex: Female     Is Non-Hispanic African American: No     Diabetic: No     Tobacco smoker: No     Systolic Blood Pressure: 114 mmHg     Is BP treated: No     HDL Cholesterol: 55.7 mg/dL     Total Cholesterol: 187 mg/dL Low cholesterol diet and exercise. Follow lipid panel.

## 2024-05-05 NOTE — Assessment & Plan Note (Signed)
 Saw vascular 08/16/23 - noninvasive studies showed improvement. Recommended f/u in one year.

## 2024-05-05 NOTE — Assessment & Plan Note (Signed)
 Continue buspar . Overall stable.

## 2024-05-05 NOTE — Assessment & Plan Note (Signed)
 Followed by rheumatology. Seeing Dr Tobie and Dr Loreli [facility]). Recent echo ok. Finger tips ok. Refill bactroban .  Seeing GI for swallowing issues and reflux. Continue PPI. Discussed taking nexium and two pepcid  as directed. Follow. Call with update. Continue f/u with rheumatology.

## 2024-05-05 NOTE — Assessment & Plan Note (Signed)
 Vitamin D  level 08/2023 - wnl.

## 2024-05-05 NOTE — Assessment & Plan Note (Signed)
 Increased stress and anxiety related to recent labs and decrease in GFR. Discussed staying hydrated. Continue to avoid antiinflammatory medications. Continue PPI as discussed. Request repeat GFR testing today.

## 2024-05-09 ENCOUNTER — Ambulatory Visit

## 2024-05-13 ENCOUNTER — Ambulatory Visit

## 2024-05-16 ENCOUNTER — Ambulatory Visit: Admitting: Internal Medicine

## 2024-05-20 ENCOUNTER — Encounter: Payer: Self-pay | Admitting: Internal Medicine

## 2024-05-20 ENCOUNTER — Ambulatory Visit: Admitting: Internal Medicine

## 2024-05-20 VITALS — BP 118/64 | HR 74 | Temp 97.6°F | Resp 17 | Ht 64.5 in | Wt 155.8 lb

## 2024-05-20 DIAGNOSIS — K2100 Gastro-esophageal reflux disease with esophagitis, without bleeding: Secondary | ICD-10-CM | POA: Diagnosis not present

## 2024-05-20 DIAGNOSIS — I739 Peripheral vascular disease, unspecified: Secondary | ICD-10-CM | POA: Diagnosis not present

## 2024-05-20 DIAGNOSIS — M349 Systemic sclerosis, unspecified: Secondary | ICD-10-CM | POA: Diagnosis not present

## 2024-05-20 DIAGNOSIS — F419 Anxiety disorder, unspecified: Secondary | ICD-10-CM

## 2024-05-20 DIAGNOSIS — E7800 Pure hypercholesterolemia, unspecified: Secondary | ICD-10-CM

## 2024-05-20 LAB — BASIC METABOLIC PANEL WITH GFR
BUN: 10 mg/dL (ref 6–23)
CO2: 27 meq/L (ref 19–32)
Calcium: 9 mg/dL (ref 8.4–10.5)
Chloride: 105 meq/L (ref 96–112)
Creatinine, Ser: 0.85 mg/dL (ref 0.40–1.20)
GFR: 73.41 mL/min (ref >60.00)
Glucose, Bld: 80 mg/dL (ref 70–99)
Potassium: 4.4 meq/L (ref 3.5–5.1)
Sodium: 141 meq/L (ref 135–145)

## 2024-05-20 MED ORDER — GABAPENTIN 300 MG PO CAPS: 300.0000 mg | ORAL_CAPSULE | Freq: Every day | ORAL | 1 refills | Status: DC

## 2024-05-20 NOTE — Assessment & Plan Note (Signed)
 Saw vascular 08/16/23 - noninvasive studies showed improvement. Recommended f/u in one year.

## 2024-05-20 NOTE — Assessment & Plan Note (Signed)
 Saw GI 05/07/24 - f/u pharyngoesophageal dysphagia felt to be c/w esophageal spasm. Repeat EGD not recommended. Recent barium swallow showed no evidence of esophageal stricture, mass lesion or ulceration. Recommended to continue 2 altoid peppermint mints prior to eating/drinking. Wean off PPI. Continue famotidine  20-40mg  q hs. Recommended repeat EGD/CSY 03/2026. After tapering off nexium, she had increased acid reflux - actual burning acid reflux. She discussed with GI and was started back in prilosec 20mg  q am. Still taking pepcid  in the pm. Acid reflux has stopped now. Plans to f/u with GI in 4 weeks - with update.

## 2024-05-20 NOTE — Assessment & Plan Note (Signed)
 The 10-year ASCVD risk score (Arnett DK, et al., 2019) is: 3.3%   Values used to calculate the score:     Age: 63 years     Clincally relevant sex: Female     Is Non-Hispanic African American: No     Diabetic: No     Tobacco smoker: No     Systolic Blood Pressure: 118 mmHg     Is BP treated: No     HDL Cholesterol: 55.7 mg/dL     Total Cholesterol: 187 mg/dL Low cholesterol diet and exercise. Follow lipid panel.

## 2024-05-20 NOTE — Assessment & Plan Note (Signed)
 Followed by rheumatology. Seeing Dr Tobie and Dr Loreli [facility]). Recent echo ok. Finger tips ok. Being followed by GI as oultined. Back on prilosec and pepcid . No active ulcerations currently.

## 2024-05-20 NOTE — Progress Notes (Addendum)
 Subjective:    Patient ID: Denise Arnold, female    DOB: January 22, 1961, 63 y.o.   MRN: [id number]  Patient here for  Chief Complaint  Patient presents with   Medical Management of Chronic Issues    3 wk f/u    HPI Here for a scheduled follow up - follow up regarding increased stress, anxiety, acid reflux, hypercholesterolemia and scleroderma.  Recently increased concern regarding GFR. Has been stable. Also with increased upper GI issues and concern regarding PPI and kidney function. Saw GI 05/07/24 - f/u pharyngoesophageal dysphagia felt to be c/w esophageal spasm. Repeat EGD not recommended. Recent barium swallow showed no evidence of esophageal stricture, mass lesion or ulceration. Recommended to continue 2 altoid peppermint mints prior to eating/drinking. Wean off PPI. Continue famotidine  20-40mg  q hs. Recommended repeat EGD/CSY 03/2026. After tapering off nexium, she had increased acid reflux - actual burning acid reflux. She discussed with GI and was started back in prilosec 20mg  q am. Still taking pepcid  in the pm. Acid reflux has stopped now. Plans to f/u with GI in 4 weeks - with update. Request to have met b checked today to confirm kidney function stable.    Past Medical History:  Diagnosis Date   Allergy ?   Penicillin, sulpha drugs, Paxlovid,   Arthritis    GERD (gastroesophageal reflux disease)    History of endoscopy 04/05/2013   upper   Menorrhagia    Reynolds syndrome (HCC)    Scleroderma (HCC)    positive FANA, positive SCL-70 abs, raynauds, mild pulmonary hypertension, normal DLCO   Tricuspid regurgitation    Vitamin D  deficiency    Past Surgical History:  Procedure Laterality Date   CESAREAN SECTION  1994   COLONOSCOPY WITH PROPOFOL  N/A 05/29/2015   Procedure: COLONOSCOPY WITH PROPOFOL ;  Surgeon: Lamar ONEIDA Holmes, MD;  Location: [facility] ENDOSCOPY;  Service: Endoscopy;  Laterality: N/A;   DILATION AND CURETTAGE OF UTERUS  1992   DILATION AND CURETTAGE OF UTERUS  1993    DILATION AND CURETTAGE OF UTERUS  2011   VEIN SURGERY     Family History  Problem Relation Age of Onset   Heart disease Father        myocardial infarction   Arthritis/Rheumatoid Father    Hypertension Father    Arthritis Father    COPD Father    Hyperlipidemia Mother    Breast cancer Maternal Grandmother    Cancer Maternal Grandmother    Colon cancer Neg Hx    Social History   Socioeconomic History   Marital status: Married    Spouse name: Not on file   Number of children: 1   Years of education: Not on file   Highest education level: Bachelor's degree (e.g., BA, AB, BS)  Occupational History   Not on file  Tobacco Use   Smoking status: Never   Smokeless tobacco: Never  Vaping Use   Vaping status: Never Used  Substance and Sexual Activity   Alcohol use: Yes    Comment: Occasionally   Drug use: No   Sexual activity: Yes    Birth control/protection: Condom  Other Topics Concern   Not on file  Social History Narrative   Not on file   Social Drivers of Health   Financial Resource Strain: Low Risk  (05/16/2024)   Overall Financial Resource Strain (CARDIA)    Difficulty of Paying Living Expenses: Not hard at all  Food Insecurity: No Food Insecurity (05/16/2024)   Hunger Vital Sign  Worried About Programme Researcher, Broadcasting/film/video in the Last Year: Never true    [employer] of Food in the Last Year: Never true  Transportation Needs: No Transportation Needs (05/16/2024)   PRAPARE - Administrator, Civil Service (Medical): No    Lack of Transportation (Non-Medical): No  Physical Activity: Sufficiently Active (05/16/2024)   Exercise Vital Sign    Days of Exercise per Week: 4 days    Minutes of Exercise per Session: 50 min  Stress: Stress Concern Present (05/16/2024)   [employer] of Occupational Health - Occupational Stress Questionnaire    Feeling of Stress: To some extent  Social Connections: Socially Integrated (05/16/2024)   Social Connection and Isolation  Panel    Frequency of Communication with Friends and Family: More than three times a week    Frequency of Social Gatherings with Friends and Family: Once a week    Attends Religious Services: More than 4 times per year    Active Member of [employer] or Organizations: Yes    Attends Engineer, Structural: More than 4 times per year    Marital Status: Married     Review of Systems  Constitutional:  Negative for appetite change and unexpected weight change.  HENT:  Negative for congestion and sinus pressure.   Respiratory:  Negative for cough, chest tightness and shortness of breath.   Cardiovascular:  Negative for chest pain, palpitations and leg swelling.  Gastrointestinal:  Negative for abdominal pain, diarrhea, nausea and vomiting.  Genitourinary:  Negative for difficulty urinating and dysuria.  Musculoskeletal:  Negative for myalgias.       Due f/u with rheumatology - scheduled this week.   Skin:  Negative for color change and rash.  Neurological:  Negative for dizziness and headaches.  Psychiatric/Behavioral:  Negative for agitation and dysphoric mood.        Objective:     BP 118/64   Pulse 74   Temp 97.6 F (36.4 C) (Oral)   Resp 17   Ht 5' 4.5 (1.638 m)   Wt 155 lb 12 oz (70.6 kg)   LMP 02/14/2010   SpO2 99%   BMI 26.32 kg/m  Wt Readings from Last 3 Encounters:  05/20/24 155 lb 12 oz (70.6 kg)  04/25/24 153 lb (69.4 kg)  04/08/24 156 lb (70.8 kg)    Physical Exam Vitals reviewed.  Constitutional:      General: She is not in acute distress.    Appearance: Normal appearance.  HENT:     Head: Normocephalic and atraumatic.     Right Ear: External ear normal.     Left Ear: External ear normal.     Mouth/Throat:     Pharynx: No oropharyngeal exudate or posterior oropharyngeal erythema.  Eyes:     General: No scleral icterus.       Right eye: No discharge.        Left eye: No discharge.     Conjunctiva/sclera: Conjunctivae normal.  Neck:     Thyroid :  No thyromegaly.  Cardiovascular:     Rate and Rhythm: Normal rate and regular rhythm.  Pulmonary:     Effort: No respiratory distress.     Breath sounds: Normal breath sounds. No wheezing.  Abdominal:     General: Bowel sounds are normal.     Palpations: Abdomen is soft.     Tenderness: There is no abdominal tenderness.  Musculoskeletal:        General: No swelling or tenderness.  Cervical back: Neck supple. No tenderness.  Lymphadenopathy:     Cervical: No cervical adenopathy.  Skin:    Findings: No erythema or rash.  Neurological:     Mental Status: She is alert.  Psychiatric:        Mood and Affect: Mood normal.        Behavior: Behavior normal.         Outpatient Encounter Medications as of 05/20/2024  Medication Sig   aspirin 81 MG chewable tablet Chew 81 mg by mouth daily.   azelastine  (ASTELIN ) 0.1 % nasal spray PLACE 1 SPRAY INTO BOTH NOSTRILS 2 (TWO) TIMES DAILY. USE IN EACH NOSTRIL AS DIRECTED   Biotin 5000 MCG TABS    busPIRone  (BUSPAR ) 7.5 MG tablet TAKE 1 TABLET BY MOUTH 2 TIMES DAILY. (Patient taking differently: Take 7.5 mg by mouth 3 (three) times daily.)   Calcium-Magnesium-Vitamin D  (CALCIUM 500 PO) Take by mouth.   Cholecalciferol (VITAMIN D ) 50 MCG (2000 UT) tablet Take 2,000 Units by mouth daily.   ciclopirox  (LOPROX ) 0.77 % cream Apply topically daily.   clobetasol  cream (TEMOVATE ) 0.05 % Apply to elbows twice a day as needed. Avoid face, groin, axilla.   CRANBERRY PO Take 4,800 mg by mouth daily.   Cyanocobalamin (B-12 SL) Place 500 mcg under the tongue daily.   famotidine  (PEPCID ) 20 MG tablet Take 1 tablet (20 mg total) by mouth at bedtime.   fish oil-omega-3 fatty acids 1000 MG capsule Take 1 g by mouth daily. (Patient taking differently: Take 500 mg by mouth daily.)   Glucosamine-Chondroitin 1500-1200 MG/30ML LIQD    Magnesium 250 MG TABS    MISC NATURAL PRODUCTS PO Herbal Supplement   Multiple Vitamin (MULTIVITAMIN) tablet Take 1 tablet by  mouth daily.   mupirocin  ointment (BACTROBAN ) 2 % Apply topically 3 (three) times daily.   omeprazole  (PRILOSEC) 20 MG capsule Take 20 mg by mouth 2 (two) times daily.   Probiotic Product (PROBIOTIC PO) Take by mouth.   SANTYL 250 UNIT/GM ointment Apply 1 Application topically daily.   sildenafil (REVATIO) 20 MG tablet Take 20 mg by mouth 2 (two) times daily.   tacrolimus  (PROTOPIC ) 0.1 % ointment APPLY TO AFFECTED AREAS ONCE TO TWICE A DAY.   Tavaborole  5 % SOLN APPLY TO AFFECTED TOENAIL EVERY NIGHT UNTIL IMPROVED.   gabapentin  (NEURONTIN ) 300 MG capsule Take 1 capsule (300 mg total) by mouth at bedtime.   [DISCONTINUED] esomeprazole (NEXIUM) 40 MG capsule Take 1 capsule (40 mg total) by mouth daily.   [DISCONTINUED] gabapentin  (NEURONTIN ) 300 MG capsule Take 1 capsule (300 mg total) by mouth at bedtime.   No facility-administered encounter medications on file as of 05/20/2024.     Lab Results  Component Value Date   WBC 4.9 09/11/2023   HGB 13.4 09/11/2023   HCT 40.1 09/11/2023   PLT 205.0 09/11/2023   GLUCOSE 95 04/25/2024   CHOL 187 04/05/2024   TRIG 118.0 04/05/2024   HDL 55.70 04/05/2024   LDLDIRECT 113.0 10/24/2022   LDLCALC 108 (H) 04/05/2024   ALT 10 04/05/2024   AST 16 04/05/2024   NA 136 04/25/2024   K 4.6 04/25/2024   CL 101 04/25/2024   CREATININE 0.92 04/25/2024   BUN 10 04/25/2024   CO2 30 04/25/2024   TSH 2.20 01/03/2024    MM 3D SCREENING MAMMOGRAM BILATERAL BREAST Result Date: 02/12/2024 CLINICAL DATA:  Screening. EXAM: DIGITAL SCREENING BILATERAL MAMMOGRAM WITH TOMOSYNTHESIS AND CAD TECHNIQUE: Bilateral screening digital craniocaudal and mediolateral oblique mammograms  were obtained. Bilateral screening digital breast tomosynthesis was performed. The images were evaluated with computer-aided detection. COMPARISON:  Previous exam(s). ACR Breast Density Category b: There are scattered areas of fibroglandular density. FINDINGS: There are no findings suspicious  for malignancy. IMPRESSION: No mammographic evidence of malignancy. A result letter of this screening mammogram will be mailed directly to the patient. RECOMMENDATION: Screening mammogram in one year. (Code:SM-B-01Y) BI-RADS CATEGORY  1: Negative. Electronically Signed   By: Reyes Phi M.D.   On: 02/12/2024 08:44       Assessment & Plan:  Gastroesophageal reflux disease with esophagitis without hemorrhage Assessment & Plan: Saw GI 05/07/24 - f/u pharyngoesophageal dysphagia felt to be c/w esophageal spasm. Repeat EGD not recommended. Recent barium swallow showed no evidence of esophageal stricture, mass lesion or ulceration. Recommended to continue 2 altoid peppermint mints prior to eating/drinking. Wean off PPI. Continue famotidine  20-40mg  q hs. Recommended repeat EGD/CSY 03/2026. After tapering off nexium, she had increased acid reflux - actual burning acid reflux. She discussed with GI and was started back in prilosec 20mg  q am. Still taking pepcid  in the pm. Acid reflux has stopped now. Plans to f/u with GI in 4 weeks - with update.    Anxiety Assessment & Plan: Continue buspar . Overall stable.    Scleroderma (HCC) Assessment & Plan: Followed by rheumatology. Seeing Dr Tobie and Dr Loreli [facility]). Recent echo ok. Finger tips ok. Being followed by GI as oultined. Back on prilosec and pepcid . No active ulcerations currently.   Orders: -     Basic metabolic panel with GFR  PAD (peripheral artery disease) Assessment & Plan: Saw vascular 08/16/23 - noninvasive studies showed improvement.  Recommended f/u in one year.    Hypercholesteremia Assessment & Plan: The 10-year ASCVD risk score (Arnett DK, et al., 2019) is: 3.3%   Values used to calculate the score:     Age: 87 years     Clincally relevant sex: Female     Is Non-Hispanic African American: No     Diabetic: No     Tobacco smoker: No     Systolic Blood Pressure: 118 mmHg     Is BP treated: No     HDL Cholesterol: 55.7 mg/dL      Total Cholesterol: 187 mg/dL Low cholesterol diet and exercise. Follow lipid panel.    Other orders -     Gabapentin ; Take 1 capsule (300 mg total) by mouth at bedtime.  Dispense: 90 capsule; Refill: 1     Allena Hamilton, MD

## 2024-05-20 NOTE — Assessment & Plan Note (Signed)
 Continue buspar . Overall stable.

## 2024-05-21 ENCOUNTER — Ambulatory Visit: Payer: Self-pay | Admitting: Internal Medicine

## 2024-05-23 ENCOUNTER — Ambulatory Visit

## 2024-06-10 ENCOUNTER — Ambulatory Visit: Admitting: Internal Medicine

## 2024-06-10 VITALS — BP 110/68 | HR 71 | Temp 98.0°F | Resp 18 | Ht 64.5 in | Wt 155.0 lb

## 2024-06-10 DIAGNOSIS — M349 Systemic sclerosis, unspecified: Secondary | ICD-10-CM

## 2024-06-10 DIAGNOSIS — E559 Vitamin D deficiency, unspecified: Secondary | ICD-10-CM

## 2024-06-10 DIAGNOSIS — E7800 Pure hypercholesterolemia, unspecified: Secondary | ICD-10-CM

## 2024-06-10 DIAGNOSIS — I739 Peripheral vascular disease, unspecified: Secondary | ICD-10-CM

## 2024-06-10 DIAGNOSIS — L989 Disorder of the skin and subcutaneous tissue, unspecified: Secondary | ICD-10-CM

## 2024-06-10 DIAGNOSIS — Z860100 Personal history of colon polyps, unspecified: Secondary | ICD-10-CM

## 2024-06-10 DIAGNOSIS — F419 Anxiety disorder, unspecified: Secondary | ICD-10-CM

## 2024-06-10 DIAGNOSIS — K2100 Gastro-esophageal reflux disease with esophagitis, without bleeding: Secondary | ICD-10-CM

## 2024-06-10 MED ORDER — DOXYCYCLINE HYCLATE 100 MG PO TABS: 100.0000 mg | ORAL_TABLET | Freq: Two times a day (BID) | ORAL | 0 refills | Status: DC

## 2024-06-10 MED ORDER — GABAPENTIN 300 MG PO CAPS: 300.0000 mg | ORAL_CAPSULE | Freq: Every day | ORAL | 1 refills | Status: AC

## 2024-06-10 NOTE — Progress Notes (Signed)
 Subjective:    Patient ID: Denise Arnold, female    DOB: 1961-06-16, 63 y.o.   MRN: [id number]  Patient here for  Chief Complaint  Patient presents with   Follow-up    4 week   skin check    Right leg     HPI Here for a scheduled follow up - follow up regarding increased stress, anxiety, acid reflux, hypercholesterolemia and scleroderma.  Recently increased concern regarding GFR. Has been stable.  Also with increased upper GI issues and concern regarding PPI and kidney function. Saw GI 05/07/24 - f/u pharyngoesophageal dysphagia felt to be c/w esophageal spasm. Repeat EGD not recommended. Recent barium swallow showed no evidence of esophageal stricture, mass lesion or ulceration. Recommended to continue 2 altoid peppermint mints prior to eating/drinking. Wean off PPI. Continue famotidine  20-40mg  q hs. Recommended repeat EGD/CSY 03/2026. After tapering off nexium , she had increased acid reflux - actual burning acid reflux. She discussed with GI and was started back in prilosec 20mg  q am. Still taking pepcid  in the pm. Acid reflux has stopped now. Plans to f/u with GI.  Saw dermatology - s/p lesion excision - right lower leg. Persistent open lesion - surrounding erythema. Breathing stable. No abdominal pain or bowel change reported.    Past Medical History:  Diagnosis Date   Allergy ?   Penicillin, sulpha drugs, Paxlovid,   Arthritis    GERD (gastroesophageal reflux disease)    History of endoscopy 04/05/2013   upper   Menorrhagia    Reynolds syndrome (HCC)    Scleroderma (HCC)    positive FANA, positive SCL-70 abs, raynauds, mild pulmonary hypertension, normal DLCO   Tricuspid regurgitation    Vitamin D  deficiency    Past Surgical History:  Procedure Laterality Date   CESAREAN SECTION  1994   COLONOSCOPY WITH PROPOFOL  N/A 05/29/2015   Procedure: COLONOSCOPY WITH PROPOFOL ;  Surgeon: Lamar ONEIDA Holmes, MD;  Location: [facility] ENDOSCOPY;  Service: Endoscopy;  Laterality: N/A;   DILATION  AND CURETTAGE OF UTERUS  1992   DILATION AND CURETTAGE OF UTERUS  1993   DILATION AND CURETTAGE OF UTERUS  2011   VEIN SURGERY     Family History  Problem Relation Age of Onset   Heart disease Father        myocardial infarction   Arthritis/Rheumatoid Father    Hypertension Father    Arthritis Father    COPD Father    Hyperlipidemia Mother    Breast cancer Maternal Grandmother    Cancer Maternal Grandmother    Colon cancer Neg Hx    Social History   Socioeconomic History   Marital status: Married    Spouse name: Not on file   Number of children: 1   Years of education: Not on file   Highest education level: Bachelor's degree (e.g., BA, AB, BS)  Occupational History   Not on file  Tobacco Use   Smoking status: Never   Smokeless tobacco: Never  Vaping Use   Vaping status: Never Used  Substance and Sexual Activity   Alcohol use: Yes    Comment: Occasionally   Drug use: No   Sexual activity: Yes    Birth control/protection: Condom  Other Topics Concern   Not on file  Social History Narrative   Not on file   Social Drivers of Health   Financial Resource Strain: Low Risk  (05/16/2024)   Overall Financial Resource Strain (CARDIA)    Difficulty of Paying Living Expenses: Not hard at  all  Food Insecurity: No Food Insecurity (05/16/2024)   Hunger Vital Sign    Worried About Running Out of Food in the Last Year: Never true    Ran Out of Food in the Last Year: Never true  Transportation Needs: No Transportation Needs (05/16/2024)   PRAPARE - Administrator, Civil Service (Medical): No    Lack of Transportation (Non-Medical): No  Physical Activity: Sufficiently Active (05/16/2024)   Exercise Vital Sign    Days of Exercise per Week: 4 days    Minutes of Exercise per Session: 50 min  Stress: Stress Concern Present (05/16/2024)   [employer] of Occupational Health - Occupational Stress Questionnaire    Feeling of Stress: To some extent  Social  Connections: Socially Integrated (05/16/2024)   Social Connection and Isolation Panel    Frequency of Communication with Friends and Family: More than three times a week    Frequency of Social Gatherings with Friends and Family: Once a week    Attends Religious Services: More than 4 times per year    Active Member of [employer] or Organizations: Yes    Attends Engineer, Structural: More than 4 times per year    Marital Status: Married     Review of Systems  Constitutional:  Negative for appetite change and unexpected weight change.  HENT:  Negative for congestion and sinus pressure.   Respiratory:  Negative for cough, chest tightness and shortness of breath.   Cardiovascular:  Negative for chest pain, palpitations and leg swelling.  Gastrointestinal:  Negative for abdominal pain, diarrhea, nausea and vomiting.  Genitourinary:  Negative for difficulty urinating and dysuria.  Musculoskeletal:  Negative for myalgias.       No increased swelling.   Skin:        Open lesion - right lower leg. Surrounding erythema.   Neurological:  Negative for dizziness and headaches.  Psychiatric/Behavioral:  Negative for agitation and dysphoric mood.        Increased stress.        Objective:     BP 110/68   Pulse 71   Temp 98 F (36.7 C)   Resp 18   Ht 5' 4.5 (1.638 m)   Wt 155 lb (70.3 kg)   LMP 02/14/2010   SpO2 98%   BMI 26.19 kg/m  Wt Readings from Last 3 Encounters:  06/10/24 155 lb (70.3 kg)  05/20/24 155 lb 12 oz (70.6 kg)  04/25/24 153 lb (69.4 kg)    Physical Exam Vitals reviewed.  Constitutional:      General: She is not in acute distress.    Appearance: Normal appearance.  HENT:     Head: Normocephalic and atraumatic.     Right Ear: External ear normal.     Left Ear: External ear normal.  Eyes:     General: No scleral icterus.       Right eye: No discharge.        Left eye: No discharge.     Conjunctiva/sclera: Conjunctivae normal.  Neck:     Thyroid : No  thyromegaly.  Cardiovascular:     Rate and Rhythm: Normal rate and regular rhythm.  Pulmonary:     Effort: No respiratory distress.     Breath sounds: Normal breath sounds. No wheezing.  Abdominal:     General: Bowel sounds are normal.     Palpations: Abdomen is soft.     Tenderness: There is no abdominal tenderness.  Musculoskeletal:  General: No swelling or tenderness.     Cervical back: Neck supple. No tenderness.  Lymphadenopathy:     Cervical: No cervical adenopathy.  Skin:    Findings: No rash.     Comments: Open lesion - right lower extremity - surrounding erythema.   Neurological:     Mental Status: She is alert.  Psychiatric:        Mood and Affect: Mood normal.        Behavior: Behavior normal.         Outpatient Encounter Medications as of 06/10/2024  Medication Sig   doxycycline  (VIBRA -TABS) 100 MG tablet Take 1 tablet (100 mg total) by mouth 2 (two) times daily.   aspirin 81 MG chewable tablet Chew 81 mg by mouth daily.   azelastine  (ASTELIN ) 0.1 % nasal spray PLACE 1 SPRAY INTO BOTH NOSTRILS 2 (TWO) TIMES DAILY. USE IN EACH NOSTRIL AS DIRECTED   Biotin 5000 MCG TABS    busPIRone  (BUSPAR ) 7.5 MG tablet TAKE 1 TABLET BY MOUTH 2 TIMES DAILY. (Patient taking differently: Take 7.5 mg by mouth 3 (three) times daily.)   Calcium-Magnesium -Vitamin D  (CALCIUM 500 PO) Take by mouth.   Cholecalciferol (VITAMIN D ) 50 MCG (2000 UT) tablet Take 2,000 Units by mouth daily.   ciclopirox  (LOPROX ) 0.77 % cream Apply topically daily.   clobetasol  cream (TEMOVATE ) 0.05 % Apply to elbows twice a day as needed. Avoid face, groin, axilla.   CRANBERRY PO Take 4,800 mg by mouth daily.   Cyanocobalamin (B-12 SL) Place 500 mcg under the tongue daily.   famotidine  (PEPCID ) 20 MG tablet Take 1 tablet (20 mg total) by mouth at bedtime.   fish oil-omega-3 fatty acids 1000 MG capsule Take 1 g by mouth daily. (Patient taking differently: Take 500 mg by mouth daily.)   gabapentin   (NEURONTIN ) 300 MG capsule Take 1 capsule (300 mg total) by mouth at bedtime.   Glucosamine-Chondroitin 1500-1200 MG/30ML LIQD    Magnesium  250 MG TABS    MISC NATURAL PRODUCTS PO Herbal Supplement   Multiple Vitamin (MULTIVITAMIN) tablet Take 1 tablet by mouth daily.   mupirocin  ointment (BACTROBAN ) 2 % Apply topically 3 (three) times daily.   omeprazole  (PRILOSEC) 20 MG capsule Take 20 mg by mouth 2 (two) times daily.   Probiotic Product (PROBIOTIC PO) Take by mouth.   SANTYL 250 UNIT/GM ointment Apply 1 Application topically daily.   sildenafil (REVATIO) 20 MG tablet Take 20 mg by mouth 2 (two) times daily.   tacrolimus  (PROTOPIC ) 0.1 % ointment APPLY TO AFFECTED AREAS ONCE TO TWICE A DAY.   Tavaborole  5 % SOLN APPLY TO AFFECTED TOENAIL EVERY NIGHT UNTIL IMPROVED.   [DISCONTINUED] gabapentin  (NEURONTIN ) 300 MG capsule Take 1 capsule (300 mg total) by mouth at bedtime.   No facility-administered encounter medications on file as of 06/10/2024.     Lab Results  Component Value Date   WBC 4.9 09/11/2023   HGB 13.4 09/11/2023   HCT 40.1 09/11/2023   PLT 205.0 09/11/2023   GLUCOSE 80 05/20/2024   CHOL 187 04/05/2024   TRIG 118.0 04/05/2024   HDL 55.70 04/05/2024   LDLDIRECT 113.0 10/24/2022   LDLCALC 108 (H) 04/05/2024   ALT 10 04/05/2024   AST 16 04/05/2024   NA 141 05/20/2024   K 4.4 05/20/2024   CL 105 05/20/2024   CREATININE 0.85 05/20/2024   BUN 10 05/20/2024   CO2 27 05/20/2024   TSH 2.20 01/03/2024    MM 3D SCREENING MAMMOGRAM BILATERAL BREAST Result  Date: 02/12/2024 CLINICAL DATA:  Screening. EXAM: DIGITAL SCREENING BILATERAL MAMMOGRAM WITH TOMOSYNTHESIS AND CAD TECHNIQUE: Bilateral screening digital craniocaudal and mediolateral oblique mammograms were obtained. Bilateral screening digital breast tomosynthesis was performed. The images were evaluated with computer-aided detection. COMPARISON:  Previous exam(s). ACR Breast Density Category b: There are scattered areas of  fibroglandular density. FINDINGS: There are no findings suspicious for malignancy. IMPRESSION: No mammographic evidence of malignancy. A result letter of this screening mammogram will be mailed directly to the patient. RECOMMENDATION: Screening mammogram in one year. (Code:SM-B-01Y) BI-RADS CATEGORY  1: Negative. Electronically Signed   By: Reyes Phi M.D.   On: 02/12/2024 08:44       Assessment & Plan:  Vitamin D  deficiency Assessment & Plan: Vitamin d  level 08/2023 - wnl.    Scleroderma (HCC) Assessment & Plan: Followed by rheumatology. Seeing Dr Tobie and Dr Loreli [facility]). Recent echo ok. Being followed by GI as oultined. Back on prilosec and pepcid . Some change - fingertips. No significant ulceration or erythema. Doxycycline  - to treat erythema - leg lesion.    PAD (peripheral artery disease) Assessment & Plan: Saw vascular 08/16/23 - noninvasive studies showed improvement.  Recommended f/u in one year.    Hypercholesteremia Assessment & Plan: The 10-year ASCVD risk score (Arnett DK, et al., 2019) is: 2.9%   Values used to calculate the score:     Age: 36 years     Clincally relevant sex: Female     Is Non-Hispanic African American: No     Diabetic: No     Tobacco smoker: No     Systolic Blood Pressure: 110 mmHg     Is BP treated: No     HDL Cholesterol: 55.7 mg/dL     Total Cholesterol: 187 mg/dL Low cholesterol diet and exercise. Follow lipid panel.    History of colonic polyps Assessment & Plan: Colonoscopy 03/2021.    Gastroesophageal reflux disease with esophagitis without hemorrhage Assessment & Plan: Doing well on prilosec 20mg  q am and pepcid  20mg  q pm. Symptoms controlled. Follow.    Anxiety Assessment & Plan: Continue buspar . Discussed. Notify me if feels needs further intervention. Follow.    Leg lesion Assessment & Plan: Open leg lesion- s/u excision (dermatology). Treat with doxycycline  as directed. Follow.    Other orders -     Gabapentin ; Take  1 capsule (300 mg total) by mouth at bedtime.  Dispense: 90 capsule; Refill: 1 -     Doxycycline  Hyclate; Take 1 tablet (100 mg total) by mouth 2 (two) times daily.  Dispense: 14 tablet; Refill: 0     Allena Hamilton, MD

## 2024-06-15 ENCOUNTER — Encounter: Payer: Self-pay | Admitting: Internal Medicine

## 2024-06-15 DIAGNOSIS — L989 Disorder of the skin and subcutaneous tissue, unspecified: Secondary | ICD-10-CM | POA: Insufficient documentation

## 2024-06-15 DIAGNOSIS — S81809A Unspecified open wound, unspecified lower leg, initial encounter: Secondary | ICD-10-CM | POA: Insufficient documentation

## 2024-06-15 NOTE — Assessment & Plan Note (Signed)
 Continue buspar . Discussed. Notify me if feels needs further intervention. Follow.

## 2024-06-15 NOTE — Assessment & Plan Note (Signed)
 Doing well on prilosec 20mg  q am and pepcid  20mg  q pm. Symptoms controlled. Follow.

## 2024-06-15 NOTE — Assessment & Plan Note (Signed)
 Vitamin d  level 08/2023 - wnl.

## 2024-06-15 NOTE — Assessment & Plan Note (Signed)
 Followed by rheumatology. Seeing Dr Tobie and Dr Loreli [facility]). Recent echo ok. Being followed by GI as oultined. Back on prilosec and pepcid . Some change - fingertips. No significant ulceration or erythema. Doxycycline  - to treat erythema - leg lesion.

## 2024-06-15 NOTE — Assessment & Plan Note (Signed)
 Saw vascular 08/16/23 - noninvasive studies showed improvement. Recommended f/u in one year.

## 2024-06-15 NOTE — Assessment & Plan Note (Signed)
 The 10-year ASCVD risk score (Arnett DK, et al., 2019) is: 2.9%   Values used to calculate the score:     Age: 63 years     Clincally relevant sex: Female     Is Non-Hispanic African American: No     Diabetic: No     Tobacco smoker: No     Systolic Blood Pressure: 110 mmHg     Is BP treated: No     HDL Cholesterol: 55.7 mg/dL     Total Cholesterol: 187 mg/dL Low cholesterol diet and exercise. Follow lipid panel.

## 2024-06-15 NOTE — Assessment & Plan Note (Signed)
 Open leg lesion- s/u excision (dermatology). Treat with doxycycline  as directed. Follow.

## 2024-06-15 NOTE — Assessment & Plan Note (Signed)
Colonoscopy 03/2021.  

## 2024-06-20 ENCOUNTER — Encounter: Payer: Self-pay | Admitting: Internal Medicine

## 2024-06-20 MED ORDER — DOXYCYCLINE HYCLATE 100 MG PO TABS: 100.0000 mg | ORAL_TABLET | Freq: Two times a day (BID) | ORAL | 0 refills | Status: DC

## 2024-06-20 NOTE — Telephone Encounter (Signed)
 Please call her and let her know that I have sent in doxycycline . She will need to take probiotics while she is on the antibiotics and for two weeks after. If persistent problems, will need to be reevaluated.

## 2024-06-20 NOTE — Telephone Encounter (Signed)
 PT NOTIFIED

## 2024-07-08 ENCOUNTER — Encounter: Payer: Self-pay | Admitting: Internal Medicine

## 2024-07-08 ENCOUNTER — Ambulatory Visit: Payer: BC Managed Care – PPO | Admitting: Dermatology

## 2024-07-08 ENCOUNTER — Ambulatory Visit: Admitting: Internal Medicine

## 2024-07-08 VITALS — BP 120/70 | HR 74 | Temp 97.6°F | Ht 64.5 in | Wt 157.0 lb

## 2024-07-08 DIAGNOSIS — I739 Peripheral vascular disease, unspecified: Secondary | ICD-10-CM | POA: Diagnosis not present

## 2024-07-08 DIAGNOSIS — E559 Vitamin D deficiency, unspecified: Secondary | ICD-10-CM

## 2024-07-08 DIAGNOSIS — M349 Systemic sclerosis, unspecified: Secondary | ICD-10-CM

## 2024-07-08 DIAGNOSIS — E7800 Pure hypercholesterolemia, unspecified: Secondary | ICD-10-CM | POA: Diagnosis not present

## 2024-07-08 DIAGNOSIS — K2100 Gastro-esophageal reflux disease with esophagitis, without bleeding: Secondary | ICD-10-CM

## 2024-07-08 DIAGNOSIS — Z860100 Personal history of colon polyps, unspecified: Secondary | ICD-10-CM | POA: Diagnosis not present

## 2024-07-08 DIAGNOSIS — S81809D Unspecified open wound, unspecified lower leg, subsequent encounter: Secondary | ICD-10-CM

## 2024-07-08 DIAGNOSIS — F419 Anxiety disorder, unspecified: Secondary | ICD-10-CM

## 2024-07-08 NOTE — Progress Notes (Signed)
 "  Subjective:    Patient ID: Charlies ONEIDA Oman, female    DOB: [birth year], 63 y.o.   MRN: [id number]  Patient here for  Chief Complaint  Patient presents with   Medical Management of Chronic Issues    Leg swelling    HPI Here for a scheduled follow up- follow up regarding increased stress, anxiety, acid reflux, hypercholesterolemia and scleroderma. Recently increased concern regarding GFR. Has been stable.  Also with increased upper GI issues and concern regarding PPI and kidney function. Saw GI 05/07/24 - f/u pharyngoesophageal dysphagia felt to be c/w esophageal spasm. Repeat EGD not recommended. Recent barium swallow showed no evidence of esophageal stricture, mass lesion or ulceration. Recommended to continue 2 altoid peppermint mints prior to eating/drinking. Wean off PPI. Continue famotidine  20-40mg  q hs. Recommended repeat EGD/CSY 03/2026. After tapering off nexium , she had increased acid reflux - actual burning acid reflux. She discussed with GI and was started back on prilosec 20mg  q am. Still taking pepcid  in the pm. This appears to be controlling her symptoms. Saw dermatology - s/p lesion excision - right lower leg. Recent finger flare. Treated with doxycycline . Leg lesion - improved - healing. No finger ulcerations. Breathing stable. No chest pain reported. No abdominal pain or bowel change reported.    Past Medical History:  Diagnosis Date   Allergy ?   Penicillin, sulpha drugs, Paxlovid,   Arthritis    GERD (gastroesophageal reflux disease)    History of endoscopy 04/05/2013   upper   Menorrhagia    Reynolds syndrome (HCC)    Scleroderma (HCC)    positive FANA, positive SCL-70 abs, raynauds, mild pulmonary hypertension, normal DLCO   Tricuspid regurgitation    Vitamin D  deficiency    Past Surgical History:  Procedure Laterality Date   CESAREAN SECTION  1994   COLONOSCOPY WITH PROPOFOL  N/A 05/29/2015   Procedure: COLONOSCOPY WITH PROPOFOL ;  Surgeon: Lamar ONEIDA Holmes, MD;   Location: [facility] ENDOSCOPY;  Service: Endoscopy;  Laterality: N/A;   DILATION AND CURETTAGE OF UTERUS  1992   DILATION AND CURETTAGE OF UTERUS  1993   DILATION AND CURETTAGE OF UTERUS  2011   VEIN SURGERY     Family History  Problem Relation Age of Onset   Heart disease Father        myocardial infarction   Arthritis/Rheumatoid Father    Hypertension Father    Arthritis Father    COPD Father    Hyperlipidemia Mother    Breast cancer Maternal Grandmother    Cancer Maternal Grandmother    Colon cancer Neg Hx    Social History   Socioeconomic History   Marital status: Married    Spouse name: Not on file   Number of children: 1   Years of education: Not on file   Highest education level: Bachelor's degree (e.g., BA, AB, BS)  Occupational History   Not on file  Tobacco Use   Smoking status: Never   Smokeless tobacco: Never  Vaping Use   Vaping status: Never Used  Substance and Sexual Activity   Alcohol use: Yes    Comment: Occasionally   Drug use: No   Sexual activity: Yes    Birth control/protection: Condom  Other Topics Concern   Not on file  Social History Narrative   Not on file   Social Drivers of Health   Tobacco Use: Low Risk (07/14/2024)   Patient History    Smoking Tobacco Use: Never    Smokeless Tobacco Use: Never  Passive Exposure: Not on file  Financial Resource Strain: Low Risk (05/16/2024)   Overall Financial Resource Strain (CARDIA)    Difficulty of Paying Living Expenses: Not hard at all  Food Insecurity: No Food Insecurity (05/16/2024)   Epic    Worried About Programme Researcher, Broadcasting/film/video in the Last Year: Never true    Ran Out of Food in the Last Year: Never true  Transportation Needs: No Transportation Needs (05/16/2024)   Epic    Lack of Transportation (Medical): No    Lack of Transportation (Non-Medical): No  Physical Activity: Sufficiently Active (05/16/2024)   Exercise Vital Sign    Days of Exercise per Week: 4 days    Minutes of Exercise per  Session: 50 min  Stress: Stress Concern Present (05/16/2024)   [employer] of Occupational Health - Occupational Stress Questionnaire    Feeling of Stress: To some extent  Social Connections: Socially Integrated (05/16/2024)   Social Connection and Isolation Panel    Frequency of Communication with Friends and Family: More than three times a week    Frequency of Social Gatherings with Friends and Family: Once a week    Attends Religious Services: More than 4 times per year    Active Member of Clubs or Organizations: Yes    Attends Banker Meetings: More than 4 times per year    Marital Status: Married  Depression (PHQ2-9): Low Risk (06/10/2024)   Depression (PHQ2-9)    PHQ-2 Score: 3  Alcohol Screen: Low Risk (05/16/2024)   Alcohol Screen    Last Alcohol Screening Score (AUDIT): 1  Housing: Low Risk (05/16/2024)   Epic    Unable to Pay for Housing in the Last Year: No    Number of Times Moved in the Last Year: 0    Homeless in the Last Year: No  Utilities: Not At Risk (11/01/2023)   Received from [facility] Utilities    Threatened with loss of utilities: No  Health Literacy: Not on file     Review of Systems  Constitutional:  Negative for appetite change and unexpected weight change.  HENT:  Negative for congestion and sinus pressure.   Respiratory:  Negative for cough, chest tightness and shortness of breath.   Cardiovascular:  Negative for chest pain and palpitations.       No signficant increased swelling on exam today.   Gastrointestinal:  Negative for abdominal pain, diarrhea, nausea and vomiting.  Genitourinary:  Negative for difficulty urinating and dysuria.  Musculoskeletal:  Negative for joint swelling and myalgias.  Skin:  Negative for color change and rash.       Leg lesion - healing.   Neurological:  Negative for dizziness and headaches.  Psychiatric/Behavioral:  Negative for agitation and dysphoric mood.         Objective:     BP 120/70   Pulse 74   Temp 97.6 F (36.4 C) (Oral)   Ht 5' 4.5 (1.638 m)   Wt 157 lb (71.2 kg)   LMP 02/14/2010   SpO2 96%   BMI 26.53 kg/m  Wt Readings from Last 3 Encounters:  07/08/24 157 lb (71.2 kg)  06/10/24 155 lb (70.3 kg)  05/20/24 155 lb 12 oz (70.6 kg)    Physical Exam Vitals reviewed.  Constitutional:      General: She is not in acute distress.    Appearance: Normal appearance.  HENT:     Head: Normocephalic and atraumatic.  Right Ear: External ear normal.     Left Ear: External ear normal.     Mouth/Throat:     Pharynx: No oropharyngeal exudate or posterior oropharyngeal erythema.  Eyes:     General: No scleral icterus.       Right eye: No discharge.        Left eye: No discharge.     Conjunctiva/sclera: Conjunctivae normal.  Neck:     Thyroid : No thyromegaly.  Cardiovascular:     Rate and Rhythm: Normal rate and regular rhythm.  Pulmonary:     Effort: No respiratory distress.     Breath sounds: Normal breath sounds. No wheezing.  Abdominal:     General: Bowel sounds are normal.     Palpations: Abdomen is soft.     Tenderness: There is no abdominal tenderness.  Musculoskeletal:        General: No swelling or tenderness.     Cervical back: Neck supple. No tenderness.  Lymphadenopathy:     Cervical: No cervical adenopathy.  Skin:    Findings: No erythema or rash.     Comments: Circular open lesion - lower leg - healing. No surrounding erythema.   Neurological:     Mental Status: She is alert.  Psychiatric:        Mood and Affect: Mood normal.        Behavior: Behavior normal.         Outpatient Encounter Medications as of 07/08/2024  Medication Sig   aspirin 81 MG chewable tablet Chew 81 mg by mouth daily.   azelastine  (ASTELIN ) 0.1 % nasal spray PLACE 1 SPRAY INTO BOTH NOSTRILS 2 (TWO) TIMES DAILY. USE IN EACH NOSTRIL AS DIRECTED   Biotin 5000 MCG TABS    busPIRone  (BUSPAR ) 7.5 MG tablet TAKE 1 TABLET BY MOUTH 2  TIMES DAILY. (Patient taking differently: Take 7.5 mg by mouth 3 (three) times daily.)   Calcium-Magnesium -Vitamin D  (CALCIUM 500 PO) Take by mouth.   Cholecalciferol (VITAMIN D ) 50 MCG (2000 UT) tablet Take 2,000 Units by mouth daily.   ciclopirox  (LOPROX ) 0.77 % cream Apply topically daily.   clobetasol  cream (TEMOVATE ) 0.05 % Apply to elbows twice a day as needed. Avoid face, groin, axilla.   CRANBERRY PO Take 4,800 mg by mouth daily.   Cyanocobalamin  (B-12 SL) Place 500 mcg under the tongue daily.   famotidine  (PEPCID ) 20 MG tablet Take 1 tablet (20 mg total) by mouth at bedtime.   fish oil-omega-3 fatty acids 1000 MG capsule Take 1 g by mouth daily. (Patient taking differently: Take 500 mg by mouth daily.)   gabapentin  (NEURONTIN ) 300 MG capsule Take 1 capsule (300 mg total) by mouth at bedtime.   Glucosamine-Chondroitin 1500-1200 MG/30ML LIQD    Magnesium  250 MG TABS    MISC NATURAL PRODUCTS PO Herbal Supplement   Multiple Vitamin (MULTIVITAMIN) tablet Take 1 tablet by mouth daily.   mupirocin  ointment (BACTROBAN ) 2 % Apply topically 3 (three) times daily.   omeprazole  (PRILOSEC) 20 MG capsule Take 20 mg by mouth 2 (two) times daily.   Probiotic Product (PROBIOTIC PO) Take by mouth.   SANTYL 250 UNIT/GM ointment Apply 1 Application topically daily.   sildenafil (REVATIO) 20 MG tablet Take 20 mg by mouth 2 (two) times daily.   tacrolimus  (PROTOPIC ) 0.1 % ointment APPLY TO AFFECTED AREAS ONCE TO TWICE A DAY.   Tavaborole  5 % SOLN APPLY TO AFFECTED TOENAIL EVERY NIGHT UNTIL IMPROVED.   [DISCONTINUED] doxycycline  (VIBRA -TABS) 100 MG tablet Take  1 tablet (100 mg total) by mouth 2 (two) times daily.   No facility-administered encounter medications on file as of 07/08/2024.     Lab Results  Component Value Date   WBC 4.9 09/11/2023   HGB 13.4 09/11/2023   HCT 40.1 09/11/2023   PLT 205.0 09/11/2023   GLUCOSE 80 05/20/2024   CHOL 187 04/05/2024   TRIG 118.0 04/05/2024   HDL 55.70  04/05/2024   LDLDIRECT 113.0 10/24/2022   LDLCALC 108 (H) 04/05/2024   ALT 10 04/05/2024   AST 16 04/05/2024   NA 141 05/20/2024   K 4.4 05/20/2024   CL 105 05/20/2024   CREATININE 0.85 05/20/2024   BUN 10 05/20/2024   CO2 27 05/20/2024   TSH 2.20 01/03/2024    MM 3D SCREENING MAMMOGRAM BILATERAL BREAST Result Date: 02/12/2024 CLINICAL DATA:  Screening. EXAM: DIGITAL SCREENING BILATERAL MAMMOGRAM WITH TOMOSYNTHESIS AND CAD TECHNIQUE: Bilateral screening digital craniocaudal and mediolateral oblique mammograms were obtained. Bilateral screening digital breast tomosynthesis was performed. The images were evaluated with computer-aided detection. COMPARISON:  Previous exam(s). ACR Breast Density Category b: There are scattered areas of fibroglandular density. FINDINGS: There are no findings suspicious for malignancy. IMPRESSION: No mammographic evidence of malignancy. A result letter of this screening mammogram will be mailed directly to the patient. RECOMMENDATION: Screening mammogram in one year. (Code:SM-B-01Y) BI-RADS CATEGORY  1: Negative. Electronically Signed   By: Reyes Phi M.D.   On: 02/12/2024 08:44       Assessment & Plan:  Vitamin D  deficiency Assessment & Plan: Vitamin D  level 09/11/23 - wnl.    Scleroderma (HCC) Assessment & Plan: Followed by rheumatology. Seeing Dr Tobie and Dr Loreli [facility]). Recent echo ok. Being followed by GI as oultined. Back on prilosec and pepcid . Controlling upper symptoms. Overall stable. Follow.    PAD (peripheral artery disease) Assessment & Plan: Saw vascular 08/16/23 - noninvasive studies showed improvement.  Recommended f/u in one year.    Open wound of lower extremity, unspecified laterality, subsequent encounter Assessment & Plan: S/p excision - dermatology. Healing well. No evidence of infection follow. Continue f/u with dermatology.    Hypercholesteremia Assessment & Plan: The 10-year ASCVD risk score (Arnett DK, et al., 2019) is:  3.4%   Values used to calculate the score:     Age: 37 years     Clinically relevant sex: Female     Is Non-Hispanic African American: No     Diabetic: No     Tobacco smoker: No     Systolic Blood Pressure: 120 mmHg     Is BP treated: No     HDL Cholesterol: 55.7 mg/dL     Total Cholesterol: 187 mg/dL Low cholesterol diet and exercise. Follow lipid panel.    History of colonic polyps Assessment & Plan: Colonoscopy 03/2021.    Gastroesophageal reflux disease with esophagitis without hemorrhage Assessment & Plan: Controlled on prilosec 20mg  in am and pepcid  q pm. Follow.    Anxiety Assessment & Plan: Continue buspar . Takin tid. Follow.       Allena Hamilton, MD "

## 2024-07-14 ENCOUNTER — Encounter: Payer: Self-pay | Admitting: Internal Medicine

## 2024-07-14 NOTE — Assessment & Plan Note (Signed)
 Saw vascular 08/16/23 - noninvasive studies showed improvement. Recommended f/u in one year.

## 2024-07-14 NOTE — Assessment & Plan Note (Signed)
Colonoscopy 03/2021.  

## 2024-07-14 NOTE — Assessment & Plan Note (Signed)
 Vitamin D  level 09/11/23 - wnl.

## 2024-07-14 NOTE — Assessment & Plan Note (Signed)
 Controlled on prilosec 20mg  in am and pepcid  q pm. Follow.

## 2024-07-14 NOTE — Assessment & Plan Note (Signed)
 S/p excision - dermatology. Healing well. No evidence of infection follow. Continue f/u with dermatology.

## 2024-07-14 NOTE — Assessment & Plan Note (Signed)
 Continue buspar . Takin tid. Follow.

## 2024-07-14 NOTE — Assessment & Plan Note (Signed)
 Followed by rheumatology. Seeing Dr Tobie and Dr Loreli [facility]). Recent echo ok. Being followed by GI as oultined. Back on prilosec and pepcid . Controlling upper symptoms. Overall stable. Follow.

## 2024-07-14 NOTE — Assessment & Plan Note (Signed)
 The 10-year ASCVD risk score (Arnett DK, et al., 2019) is: 3.4%   Values used to calculate the score:     Age: 63 years     Clinically relevant sex: Female     Is Non-Hispanic African American: No     Diabetic: No     Tobacco smoker: No     Systolic Blood Pressure: 120 mmHg     Is BP treated: No     HDL Cholesterol: 55.7 mg/dL     Total Cholesterol: 187 mg/dL Low cholesterol diet and exercise. Follow lipid panel.

## 2024-07-16 ENCOUNTER — Ambulatory Visit: Admitting: Dermatology

## 2024-07-16 DIAGNOSIS — B353 Tinea pedis: Secondary | ICD-10-CM

## 2024-07-16 DIAGNOSIS — L309 Dermatitis, unspecified: Secondary | ICD-10-CM | POA: Diagnosis not present

## 2024-07-16 DIAGNOSIS — Z1283 Encounter for screening for malignant neoplasm of skin: Secondary | ICD-10-CM | POA: Diagnosis not present

## 2024-07-16 DIAGNOSIS — M349 Systemic sclerosis, unspecified: Secondary | ICD-10-CM | POA: Diagnosis not present

## 2024-07-16 DIAGNOSIS — D2330 Other benign neoplasm of skin of unspecified part of face: Secondary | ICD-10-CM | POA: Diagnosis not present

## 2024-07-16 DIAGNOSIS — D489 Neoplasm of uncertain behavior, unspecified: Secondary | ICD-10-CM

## 2024-07-16 DIAGNOSIS — Z85828 Personal history of other malignant neoplasm of skin: Secondary | ICD-10-CM

## 2024-07-16 DIAGNOSIS — L578 Other skin changes due to chronic exposure to nonionizing radiation: Secondary | ICD-10-CM | POA: Diagnosis not present

## 2024-07-16 DIAGNOSIS — D229 Melanocytic nevi, unspecified: Secondary | ICD-10-CM

## 2024-07-16 DIAGNOSIS — D333 Benign neoplasm of cranial nerves: Secondary | ICD-10-CM | POA: Diagnosis not present

## 2024-07-16 DIAGNOSIS — S81801A Unspecified open wound, right lower leg, initial encounter: Secondary | ICD-10-CM | POA: Diagnosis not present

## 2024-07-16 DIAGNOSIS — T1490XD Injury, unspecified, subsequent encounter: Secondary | ICD-10-CM

## 2024-07-16 DIAGNOSIS — L821 Other seborrheic keratosis: Secondary | ICD-10-CM

## 2024-07-16 DIAGNOSIS — I781 Nevus, non-neoplastic: Secondary | ICD-10-CM

## 2024-07-16 DIAGNOSIS — L814 Other melanin hyperpigmentation: Secondary | ICD-10-CM

## 2024-07-16 DIAGNOSIS — W908XXA Exposure to other nonionizing radiation, initial encounter: Secondary | ICD-10-CM

## 2024-07-16 DIAGNOSIS — D1801 Hemangioma of skin and subcutaneous tissue: Secondary | ICD-10-CM

## 2024-07-16 MED ORDER — CLOBETASOL PROPIONATE 0.05 % EX CREA: TOPICAL_CREAM | CUTANEOUS | 1 refills | Status: AC

## 2024-07-16 MED ORDER — TACROLIMUS 0.1 % EX OINT: TOPICAL_OINTMENT | CUTANEOUS | 1 refills | Status: AC

## 2024-07-16 NOTE — Progress Notes (Signed)
 "  Follow-Up Visit   Subjective  Denise Arnold is a 63 y.o. female who presents for the following: Skin Cancer Screening and Full Body Skin Exam Hx scc at right lower leg, recently treated by dermatologist at [facility]. Would like to have checked.  Hx of scleroderma at hands, legs treated by [facility] dermatologist.   The patient presents for Total-Body Skin Exam (TBSE) for skin cancer screening and mole check. The patient has spots, moles and lesions to be evaluated, some may be new or changing and the patient may have concern these could be cancer.  The following portions of the chart were reviewed this encounter and updated as appropriate: medications, allergies, medical history  Review of Systems:  No other skin or systemic complaints except as noted in HPI or Assessment and Plan.  Objective  Well appearing patient in no apparent distress; mood and affect are within normal limits.  A full examination was performed including scalp, head, eyes, ears, nose, lips, neck, chest, axillae, abdomen, back, buttocks, bilateral upper extremities, bilateral lower extremities, hands, feet, fingers, toes, fingernails, and toenails. All findings within normal limits unless otherwise noted below.   Relevant physical exam findings are noted in the Assessment and Plan. Healing wound post excision for SCC at right pretibia   Healing wound post excision for SCC at right pretibia   lower chin 3 mm pink pearly papule    Assessment & Plan   SKIN CANCER SCREENING PERFORMED TODAY.  ACTINIC DAMAGE - Chronic condition, secondary to cumulative UV/sun exposure - diffuse scaly erythematous macules with underlying dyspigmentation - Recommend daily broad spectrum sunscreen SPF 30+ to sun-exposed areas, reapply every 2 hours as needed.  - Staying in the shade or wearing long sleeves, sun glasses (UVA+UVB protection) and wide brim hats (4-inch brim around the entire circumference of the hat) are also recommended for sun  protection.  - Call for new or changing lesions.  LENTIGINES, SEBORRHEIC KERATOSES, HEMANGIOMAS - Benign normal skin lesions - Benign-appearing - Call for any changes  MELANOCYTIC NEVI - right abdomen 6.0 mm light tan macule with darker center stable compared to prior photo - Tan-brown and/or pink-flesh-colored symmetric macules and papules - Benign appearing on exam today - Observation - Call clinic for new or changing moles - Recommend daily use of broad spectrum spf 30+ sunscreen to sun-exposed areas.   Dermal Nevus  Exam: 6 mm firm flesh papule at left mid jaw Treatment Plan: Benign-appearing.  Stable. Observation.  Call clinic for new or changing lesions.  Recommend daily use of broad spectrum spf 30+ sunscreen to sun-exposed areas.    Telangiectasia Face (due to scleroderma) On nose and cheeks - Dilated blood vessel - Benign appearing on exam - Call for changes    Tinea Pedis Exam : Mild scaling on R plantar foot and maceration web spaces clear at left foot with some nail dystrophy right great toenail   Treatment Plan:  Can continue clotrimazole cream otc use daily in between toes and bottom of foot     Dermatitis Exam: Pink scaly patches on the mcps of right> left hand with some focal papules  Treatment Plan: Start tacrolimus  ointment - apply  bid daily to affected areas  Continue Clobetasol  0.05 % - as a spot treatment 1 to 2 times daily to affected areas   Topical steroids (such as triamcinolone, fluocinolone, fluocinonide, mometasone, clobetasol , halobetasol, betamethasone, hydrocortisone) can cause thinning and lightening of the skin if they are used for too long in the  same area. Your physician has selected the right strength medicine for your problem and area affected on the body. Please use your medication only as directed by your physician to prevent side effects.       Scleroderma (HCC) face.hands,feet   Exam: induration of digits/fingertips with  swelling with some healed ulcerations at the tips and decrease mobility   With healed digital ulcerations. Pt with Raynaud's, Chronic and persistent condition with duration or expected duration over one year. Condition is improving with treatment but not currently at goal.    Patient has Botox injections for medical treatment of digital ulcerations at [facility]   Continue oral Sildenafil Citrate as prescribed by Dr Alonso Netter at [facility] .   Continue Compound Medication 0.5% timolol, 2% mupirocin , 5% sildenafil, 20% STS, and 0.5% clobetasol  in an ointment base. Dispense 15g. Apply twice daily to sores on fingers as needed.   Continue Compound Medication 5% sildenafil in petroleum jelly base, Apply twice daily to hands/fingers for Raynaud's.   Continue topical sulfa/sulfur compound as prescribed   Continue tacrolimus  0.1% ointment every day/bid as needed    Continue clobetasol  0.05 % cream Apply topically 2 (two) times daily prn flares 30 g 1    Healing wound post excision for SCC at right upper pretibia  Exam: 1.4 cm healing wound post excision for SCC at right upper pretibia, no evidence of infection  Treatment Plan: Healing well, continue wound care Recommend applying mupirocin  2 % ointment and covering daily with Hydrocolloid gel bandage or Duoderm bandage until healed    SCLERODERMA (HCC)   DERMATITIS   This Visit - tacrolimus  (PROTOPIC ) 0.1 % ointment - Apply topically twice daily for all over hands for dermatitis - clobetasol  cream (TEMOVATE ) 0.05 % - Apply to elbows twice a day as needed. Avoid face, groin, axilla. NEOPLASM OF UNCERTAIN BEHAVIOR lower chin - Epidermal / dermal shaving  Lesion diameter (cm):  0.3 Informed consent: discussed and consent obtained   Patient was prepped and draped in usual sterile fashion: Area prepped with alcohol. Anesthesia: the lesion was anesthetized in a standard fashion   Anesthetic:  1% lidocaine w/ epinephrine 1-100,000  buffered w/ 8.4% NaHCO3 Instrument used: flexible razor blade   Hemostasis achieved with: pressure, aluminum chloride and electrodesiccation   Outcome: patient tolerated procedure well   Post-procedure details: wound care instructions given   Post-procedure details comment:  Ointment and small bandage applied.   - Destruction of lesion  Destruction method: electrodesiccation and curettage   Informed consent: discussed and consent obtained   Curettage performed in three different directions: Yes   Electrodesiccation performed over the curetted area: Yes   Final wound size (cm):  0.3 Hemostasis achieved with:  pressure, aluminum chloride and electrodesiccation Outcome: patient tolerated procedure well with no complications   Post-procedure details: sterile dressing applied and wound care instructions given   Dressing type: bandage and petrolatum    Specimen 1 - Surgical pathology Differential Diagnosis: r/o bcc ED&C today   Check Margins: No R/o bcc   ED&C today  Return in 6 months (on 01/14/2025) for recheck previous bx and prev scc, 1 year tbse .  I, Eleanor Blush, CMA, am acting as scribe for Rexene Rattler, MD.   Documentation: I have reviewed the above documentation for accuracy and completeness, and I agree with the above.  Rexene Rattler, MD    "

## 2024-07-16 NOTE — Patient Instructions (Addendum)
 "    Recommend Hydrocolloid gel bandage wound care section of pharmacy or  Duoderm bandage can be found on medical supply company   For wound at right leg - apply mupirocin  2 % ointment to wound and cover with bandage daily     Electrodesiccation and Curettage (Scrape and Burn) Wound Care Instructions  Leave the original bandage on for 24 hours if possible.  If the bandage becomes soaked or soiled before that time, it is OK to remove it and examine the wound.  A small amount of post-operative bleeding is normal.  If excessive bleeding occurs, remove the bandage, place gauze over the site and apply continuous pressure (no peeking) over the area for 30 minutes. If this does not work, please call our clinic as soon as possible or [place] your doctor if it is after hours.   Once a day, cleanse the wound with soap and water. It is fine to shower. If a thick crust develops you may use a Q-tip dipped into dilute hydrogen peroxide (mix 1:1 with water) to dissolve it.  Hydrogen peroxide can slow the healing process, so use it only as needed.    After washing, apply petroleum jelly (Vaseline) or an antibiotic ointment if your doctor prescribed one for you, followed by a bandage.    For best healing, the wound should be covered with a layer of ointment at all times. If you are not able to keep the area covered with a bandage to hold the ointment in place, this may mean re-applying the ointment several times a day.  Continue this wound care until the wound has healed and is no longer open. It may take several weeks for the wound to heal and close.  Itching and mild discomfort is normal during the healing process.  If you have any discomfort, you can take Tylenol (acetaminophen) or ibuprofen as directed on the bottle. (Please do not take these if you have an allergy to them or cannot take them for another reason).  Some redness, tenderness and white or yellow material in the wound is normal healing.  If  the area becomes very sore and red, or develops a thick yellow-green material (pus), it may be infected; please notify us .    Wound healing continues for up to one year following surgery. It is not unusual to experience pain in the scar from time to time during the interval.  If the pain becomes severe or the scar thickens, you should notify the office.    A slight amount of redness in a scar is expected for the first six months.  After six months, the redness will fade and the scar will soften and fade.  The color difference becomes less noticeable with time.  If there are any problems, return for a post-op surgery check at your earliest convenience.  To improve the appearance of the scar, you can use silicone scar gel, cream, or sheets (such as Mederma or Serica) every night for up to one year. These are available over the counter (without a prescription).  Please call our office at [phone] for any questions or concerns.  Biopsy Wound Care Instructions  Leave the original bandage on for 24 hours if possible.  If the bandage becomes soaked or soiled before that time, it is OK to remove it and examine the wound.  A small amount of post-operative bleeding is normal.  If excessive bleeding occurs, remove the bandage, place gauze over the site and apply continuous pressure (  no peeking) over the area for 30 minutes. If this does not work, please call our clinic as soon as possible or [place] your doctor if it is after hours.   Once a day, cleanse the wound with soap and water. It is fine to shower. If a thick crust develops you may use a Q-tip dipped into dilute hydrogen peroxide (mix 1:1 with water) to dissolve it.  Hydrogen peroxide can slow the healing process, so use it only as needed.    After washing, apply petroleum jelly (Vaseline) or an antibiotic ointment if your doctor prescribed one for you, followed by a bandage.    For best healing, the wound should be covered with a layer of ointment  at all times. If you are not able to keep the area covered with a bandage to hold the ointment in place, this may mean re-applying the ointment several times a day.  Continue this wound care until the wound has healed and is no longer open.   Itching and mild discomfort is normal during the healing process. However, if you develop pain or severe itching, please call our office.   If you have any discomfort, you can take Tylenol (acetaminophen) or ibuprofen as directed on the bottle. (Please do not take these if you have an allergy to them or cannot take them for another reason).  Some redness, tenderness and white or yellow material in the wound is normal healing.  If the area becomes very sore and red, or develops a thick yellow-green material (pus), it may be infected; please notify us .    If you have stitches, return to clinic as directed to have the stitches removed. You will continue wound care for 2-3 days after the stitches are removed.   Wound healing continues for up to one year following surgery. It is not unusual to experience pain in the scar from time to time during the interval.  If the pain becomes severe or the scar thickens, you should notify the office.    A slight amount of redness in a scar is expected for the first six months.  After six months, the redness will fade and the scar will soften and fade.  The color difference becomes less noticeable with time.  If there are any problems, return for a post-op surgery check at your earliest convenience.  To improve the appearance of the scar, you can use silicone scar gel, cream, or sheets (such as Mederma or Serica) every night for up to one year. These are available over the counter (without a prescription).  Please call our office at [phone] for any questions or concerns.       Melanoma ABCDEs  Melanoma is the most dangerous type of skin cancer, and is the leading cause of death from skin disease.  You are more  likely to develop melanoma if you: Have light-colored skin, light-colored eyes, or red or blond hair Spend a lot of time in the sun Tan regularly, either outdoors or in a tanning bed Have had blistering sunburns, especially during childhood Have a close family member who has had a melanoma Have atypical moles or large birthmarks  Early detection of melanoma is key since treatment is typically straightforward and cure rates are extremely high if we catch it early.   The first sign of melanoma is often a change in a mole or a new dark spot.  The ABCDE system is a way of remembering the signs of melanoma.  A for  asymmetry:  The two halves do not match. B for border:  The edges of the growth are irregular. C for color:  A mixture of colors are present instead of an even brown color. D for diameter:  Melanomas are usually (but not always) greater than 6mm - the size of a pencil eraser. E for evolution:  The spot keeps changing in size, shape, and color.  Please check your skin once per month between visits. You can use a small mirror in front and a large mirror behind you to keep an eye on the back side or your body.   If you see any new or changing lesions before your next follow-up, please call to schedule a visit.  Please continue daily skin protection including broad spectrum sunscreen SPF 30+ to sun-exposed areas, reapplying every 2 hours as needed when you're outdoors.   Staying in the shade or wearing long sleeves, sun glasses (UVA+UVB protection) and wide brim hats (4-inch brim around the entire circumference of the hat) are also recommended for sun protection.    Due to recent changes in healthcare laws, you may see results of your pathology and/or laboratory studies on MyChart before the doctors have had a chance to review them. We understand that in some cases there may be results that are confusing or concerning to you. Please understand that not all results are received at the same  time and often the doctors may need to interpret multiple results in order to provide you with the best plan of care or course of treatment. Therefore, we ask that you please give us  2 business days to thoroughly review all your results before contacting the office for clarification. Should we see a critical lab result, you will be contacted sooner.   If You Need Anything After Your Visit  If you have any questions or concerns for your doctor, please call our main line at [phone] and press option 4 to reach your doctor's medical assistant. If no one answers, please leave a voicemail as directed and we will return your call as soon as possible. Messages left after 4 pm will be answered the following business day.   You may also send us  a message via MyChart. We typically respond to MyChart messages within 1-2 business days.  For prescription refills, please ask your pharmacy to contact our office. Our fax number is [phone].  If you have an urgent issue when the clinic is closed that cannot wait until the next business day, you can [place] your doctor at the number below.    Please note that while we do our best to be available for urgent issues outside of office hours, we are not available 24/7.   If you have an urgent issue and are unable to reach us , you may choose to seek medical care at your doctor's office, retail clinic, urgent care center, or emergency room.  If you have a medical emergency, please immediately call 911 or go to the emergency department.  Pager Numbers  - Dr. Hester: [phone]  - Dr. Jackquline: [phone]  - Dr. Claudene: [phone]   - Dr. Raymund: [phone]  In the event of inclement weather, please call our main line at [phone] for an update on the status of any delays or closures.  Dermatology Medication Tips: Please keep the boxes that topical medications come in in order to help keep track of the instructions about where and how to use  these. Pharmacies typically print the medication instructions only  on the boxes and not directly on the medication tubes.   If your medication is too expensive, please contact our office at [phone] option 4 or send us  a message through MyChart.   We are unable to tell what your co-pay for medications will be in advance as this is different depending on your insurance coverage. However, we may be able to find a substitute medication at lower cost or fill out paperwork to get insurance to cover a needed medication.   If a prior authorization is required to get your medication covered by your insurance company, please allow us  1-2 business days to complete this process.  Drug prices often vary depending on where the prescription is filled and some pharmacies may offer cheaper prices.  The website www.goodrx.com contains coupons for medications through different pharmacies. The prices here do not account for what the cost may be with help from insurance (it may be cheaper with your insurance), but the website can give you the price if you did not use any insurance.  - You can print the associated coupon and take it with your prescription to the pharmacy.  - You may also stop by our office during regular business hours and pick up a GoodRx coupon card.  - If you need your prescription sent electronically to a different pharmacy, notify our office through [facility] or by phone at [phone] option 4.     Si Usted Necesita Algo Despus de Su Visita  Tambin puede enviarnos un mensaje a travs de Clinical Cytogeneticist. Por lo general respondemos a los mensajes de MyChart en el transcurso de 1 a 2 das hbiles.  Para renovar recetas, por favor pida a su farmacia que se ponga en contacto con nuestra oficina. Randi lakes de fax es [place] [phone].  Si tiene un asunto urgente cuando la clnica est cerrada y que no puede esperar hasta el siguiente da hbil, puede llamar/localizar a su doctor(a) al  nmero que aparece a continuacin.   Por favor, tenga en cuenta que aunque hacemos todo lo posible para estar disponibles para asuntos urgentes fuera del horario de [place], no estamos disponibles las 24 horas del da, los 7 [address]  [address] de la [place].   Si tiene un problema urgente y no puede comunicarse con nosotros, puede optar por buscar atencin mdica  en el consultorio de su doctor(a), en una clnica privada, en un centro de atencin urgente o en una sala de emergencias.  Si tiene engineer, drilling, por favor llame inmediatamente al 911 o vaya a la sala de emergencias.  Nmeros de bper  - Dr. Hester: [phone]  - Dra. Jackquline: [phone]  - Dr. Claudene: [phone]  - Dra. Kitts: [phone]  En caso de inclemencias del [place], por favor llame a nuestra lnea principal al [phone] para una actualizacin sobre el estado de cualquier retraso o cierre.  Consejos para la medicacin en dermatologa: Por favor, guarde las cajas en las que vienen los medicamentos de uso tpico para ayudarle a seguir las instrucciones sobre dnde y cmo usarlos. Las farmacias generalmente imprimen las instrucciones del medicamento slo en las cajas y no directamente en los tubos del [employer].   Si su medicamento es muy caro, por favor, pngase en contacto con landry rieger llamando al [phone] y presione la opcin 4 o envenos un mensaje a travs de Clinical Cytogeneticist.   No podemos decirle cul ser su copago por los medicamentos por adelantado ya que esto es diferente dependiendo de la cobertura de su seguro.  Sin embargo, es posible que podamos encontrar un medicamento sustituto a audiological scientist un formulario para que el seguro cubra el medicamento que se considera necesario.   Si se requiere una autorizacin previa para que su compaa de seguros cubra su medicamento, por favor permtanos de 1 a 2 das hbiles para completar este proceso.  Los precios de los medicamentos varan con frecuencia  dependiendo del environmental consultant de dnde se surte la receta y alguna farmacias pueden ofrecer precios ms baratos.  El sitio web www.goodrx.com tiene cupones para medicamentos de health and safety inspector. Los precios aqu no tienen en cuenta lo que podra costar con la ayuda del seguro (puede ser ms barato con su seguro), pero el sitio web puede darle el precio si no utiliz tourist information centre manager.  - Puede imprimir el cupn correspondiente y llevarlo con su receta a la farmacia.  - Tambin puede pasar por nuestra oficina durante el horario de atencin regular y education officer, museum una tarjeta de cupones de GoodRx.  - Si necesita que su receta se enve electrnicamente a una farmacia diferente, informe a nuestra oficina a travs de MyChart de [place] o por telfono llamando al [phone] y presione la opcin 4.  "

## 2024-07-19 LAB — SURGICAL PATHOLOGY

## 2024-07-22 ENCOUNTER — Ambulatory Visit: Payer: Self-pay | Admitting: Dermatology

## 2024-07-22 NOTE — Telephone Encounter (Signed)
 Advised patient biopsy was benign, no further treatment needed.

## 2024-07-22 NOTE — Telephone Encounter (Signed)
-----   Message from Rexene Rattler, MD sent at 07/22/2024 11:56 AM EST ----- 1. Skin, lower chin :       PALISADED ENCAPSULATED NEUROMA   Benign growth of nerve, residual treated with EDC at time of biopsy so hopefully will not recur, no further treatment needed - please call patient

## 2024-08-03 ENCOUNTER — Telehealth: Admitting: Nurse Practitioner

## 2024-08-03 DIAGNOSIS — L03011 Cellulitis of right finger: Secondary | ICD-10-CM

## 2024-08-03 MED ORDER — DOXYCYCLINE HYCLATE 100 MG PO TABS: 100.0000 mg | ORAL_TABLET | Freq: Two times a day (BID) | ORAL | 0 refills | Status: AC

## 2024-08-03 NOTE — Progress Notes (Signed)
 Hello Denise Arnold,  Today I have sent Doxy however it is strongly recommended that you see one of your providers face to face for an in depth evaluation of recurring cellulitis. Please follow up with Dr. Glendia or Dermatology. It is not normal to keep getting infections in your fingertips. You have been treated with Doxy 3 times not including today within the past 4 months. Also Dr. Glendia suggested being seen in the office if a reoccurrence after prescribing Doxy on 06-20-2024.   E Visit for Cellulitis  We are sorry that you are not feeling well. Here is how we plan to help!  Based on what you shared, it appears you may have cellulitis.   Cellulitis is a bacterial skin infection that typically presents with redness, swelling, warmth, and tenderness in the affected area. Small red spots, minor bleeding under the skin, and fluid-filled blisters may also appear. Fever can sometimes accompany the infection.    Cellulitis usually affects only one side of the body, with the lower limbs being the most commonly involved area  I have prescribed:  Doxycycline  100 mg -- Take one by mouth twice daily for 7 days  HOME CARE:  Take all medications as prescribed and be sure to complete the full course - even if your skin appears to be improving.   GET HELP RIGHT AWAY IF:  Symptoms do not begin to improve within 48 hours. Severe redness persists or worsens. If the area turns color, spreads or swells. If it blisters and opens, develops yellow-brown crust or bleeds. You develop a fever or chills. If the pain increases or becomes unbearable.  Are unable to keep fluids and food down.  MAKE SURE YOU   Understand these instructions. Will watch your condition. Will get help right away if you are not doing well or get worse.  Thank you for choosing an e-visit.   Your e-visit answers were reviewed by a board certified advanced clinical practitioner to complete your personal care plan. Depending upon the  condition, your plan could have included both over the counter or prescription medications.   Please review your pharmacy choice. Make sure the pharmacy is open so you can pick up the prescription now. If there is a problem, you may contact your provider through [employer] and have the prescription routed to another pharmacy.   Your safety is important to us . If you have drug allergies, check your prescription carefully.   For the next 24 hours you can use MyChart to ask questions about today's visit, request a non-urgent call back, or ask for a work or school excuse.   You will receive an email in the next two days asking about your experience. I hope that your e-visit has been valuable and will speed up your recovery.    I have spent 5 minutes in review of e-visit questionnaire, review and updating patient chart, medical decision making and response to patient.   Denise Dewey W Valerio Pinard, NP

## 2024-08-08 ENCOUNTER — Ambulatory Visit: Admitting: Internal Medicine

## 2024-08-08 ENCOUNTER — Encounter: Payer: Self-pay | Admitting: Internal Medicine

## 2024-08-08 VITALS — BP 126/74 | HR 84 | Temp 98.0°F | Ht 64.5 in | Wt 155.0 lb

## 2024-08-08 DIAGNOSIS — F419 Anxiety disorder, unspecified: Secondary | ICD-10-CM

## 2024-08-08 DIAGNOSIS — K2100 Gastro-esophageal reflux disease with esophagitis, without bleeding: Secondary | ICD-10-CM | POA: Diagnosis not present

## 2024-08-08 DIAGNOSIS — M349 Systemic sclerosis, unspecified: Secondary | ICD-10-CM

## 2024-08-08 DIAGNOSIS — E7800 Pure hypercholesterolemia, unspecified: Secondary | ICD-10-CM | POA: Diagnosis not present

## 2024-08-08 DIAGNOSIS — I739 Peripheral vascular disease, unspecified: Secondary | ICD-10-CM | POA: Diagnosis not present

## 2024-08-08 DIAGNOSIS — Z860100 Personal history of colon polyps, unspecified: Secondary | ICD-10-CM

## 2024-08-08 MED ORDER — BUSPIRONE HCL 7.5 MG PO TABS: 7.5000 mg | ORAL_TABLET | Freq: Three times a day (TID) | ORAL | 1 refills | Status: AC

## 2024-08-08 NOTE — Progress Notes (Signed)
 "  Subjective:    Patient ID: Denise Arnold, female    DOB: 1960/09/10, 64 y.o.   MRN: [id number]  Patient here for  Chief Complaint  Patient presents with   Medical Management of Chronic Issues    HPI Here for a scheduled follow up. Saw rheumatology 08/06/24 - continue baby aspriin and sildenafil - mild PAD. Does increased recently. Continue tacrolimus . Continue botox injections with dermatology. Recent - skin lesion removed  - chin lesion. Ok. Annual echos and PFTs - due 8-03/2025. Taking prilosec q am. Breathing stable. No increased cough or congestion. No abdominal pain or bowel change reported.    Past Medical History:  Diagnosis Date   Allergy ?   Penicillin, sulpha drugs, Paxlovid,   Arthritis    GERD (gastroesophageal reflux disease)    History of endoscopy 04/05/2013   upper   Menorrhagia    Reynolds syndrome (HCC)    Scleroderma (HCC)    positive FANA, positive SCL-70 abs, raynauds, mild pulmonary hypertension, normal DLCO   Tricuspid regurgitation    Vitamin D  deficiency    Past Surgical History:  Procedure Laterality Date   CESAREAN SECTION  1994   COLONOSCOPY WITH PROPOFOL  N/A 05/29/2015   Procedure: COLONOSCOPY WITH PROPOFOL ;  Surgeon: Lamar ONEIDA Holmes, MD;  Location: [facility] ENDOSCOPY;  Service: Endoscopy;  Laterality: N/A;   DILATION AND CURETTAGE OF UTERUS  1992   DILATION AND CURETTAGE OF UTERUS  1993   DILATION AND CURETTAGE OF UTERUS  2011   VEIN SURGERY     Family History  Problem Relation Age of Onset   Heart disease Father        myocardial infarction   Arthritis/Rheumatoid Father    Hypertension Father    Arthritis Father    COPD Father    Hyperlipidemia Mother    Breast cancer Maternal Grandmother    Cancer Maternal Grandmother    Colon cancer Neg Hx    Social History   Socioeconomic History   Marital status: Married    Spouse name: Not on file   Number of children: 1   Years of education: Not on file   Highest education level: Bachelor's  degree (e.g., BA, AB, BS)  Occupational History   Not on file  Tobacco Use   Smoking status: Never   Smokeless tobacco: Never  Vaping Use   Vaping status: Never Used  Substance and Sexual Activity   Alcohol use: Yes    Comment: Occasionally   Drug use: No   Sexual activity: Yes    Birth control/protection: Condom  Other Topics Concern   Not on file  Social History Narrative   Not on file   Social Drivers of Health   Tobacco Use: Low Risk (08/11/2024)   Patient History    Smoking Tobacco Use: Never    Smokeless Tobacco Use: Never    Passive Exposure: Not on file  Financial Resource Strain: Low Risk (05/16/2024)   Overall Financial Resource Strain (CARDIA)    Difficulty of Paying Living Expenses: Not hard at all  Food Insecurity: No Food Insecurity (05/16/2024)   Epic    Worried About Programme Researcher, Broadcasting/film/video in the Last Year: Never true    Ran Out of Food in the Last Year: Never true  Transportation Needs: No Transportation Needs (05/16/2024)   Epic    Lack of Transportation (Medical): No    Lack of Transportation (Non-Medical): No  Physical Activity: Sufficiently Active (05/16/2024)   Exercise Vital Sign  Days of Exercise per Week: 4 days    Minutes of Exercise per Session: 50 min  Stress: Stress Concern Present (05/16/2024)   [employer] of Occupational Health - Occupational Stress Questionnaire    Feeling of Stress: To some extent  Social Connections: Socially Integrated (05/16/2024)   Social Connection and Isolation Panel    Frequency of Communication with Friends and Family: More than three times a week    Frequency of Social Gatherings with Friends and Family: Once a week    Attends Religious Services: More than 4 times per year    Active Member of Clubs or Organizations: Yes    Attends Banker Meetings: More than 4 times per year    Marital Status: Married  Depression (PHQ2-9): Low Risk (08/08/2024)   Depression (PHQ2-9)    PHQ-2 Score: 1   Alcohol Screen: Low Risk (05/16/2024)   Alcohol Screen    Last Alcohol Screening Score (AUDIT): 1  Housing: Low Risk  (08/03/2024)   Received from [facility]   Epic    In the last 12 months, was there a time when you were not able to pay the mortgage or rent on time?: No    In the past 12 months, how many times have you moved where you were living?: 0    At any time in the past 12 months, were you homeless or living in a shelter (including now)?: No  Utilities: Not At Risk (11/01/2023)   Received from [facility] Utilities    Threatened with loss of utilities: No  Health Literacy: Not on file     Review of Systems  Constitutional:  Negative for appetite change and unexpected weight change.  HENT:  Negative for congestion and sinus pressure.   Respiratory:  Negative for cough, chest tightness and shortness of breath.   Cardiovascular:  Negative for chest pain, palpitations and leg swelling.  Gastrointestinal:  Negative for abdominal pain, diarrhea, nausea and vomiting.  Genitourinary:  Negative for difficulty urinating and dysuria.  Musculoskeletal:  Negative for joint swelling and myalgias.  Skin:  Negative for color change and rash.  Neurological:  Negative for dizziness and headaches.  Psychiatric/Behavioral:  Negative for agitation and dysphoric mood.        Objective:     BP 126/74   Pulse 84   Temp 98 F (36.7 C) (Oral)   Ht 5' 4.5 (1.638 m)   Wt 155 lb (70.3 kg)   LMP 02/14/2010   SpO2 96%   BMI 26.19 kg/m  Wt Readings from Last 3 Encounters:  08/08/24 155 lb (70.3 kg)  07/08/24 157 lb (71.2 kg)  06/10/24 155 lb (70.3 kg)    Physical Exam Vitals reviewed.  Constitutional:      General: She is not in acute distress.    Appearance: Normal appearance.  HENT:     Head: Normocephalic and atraumatic.     Right Ear: External ear normal.     Left Ear: External ear normal.     Mouth/Throat:     Pharynx: No  oropharyngeal exudate or posterior oropharyngeal erythema.  Eyes:     General: No scleral icterus.       Right eye: No discharge.        Left eye: No discharge.     Conjunctiva/sclera: Conjunctivae normal.  Neck:     Thyroid : No thyromegaly.  Cardiovascular:     Rate and Rhythm: Normal rate and regular rhythm.  Pulmonary:     Effort: No respiratory distress.     Breath sounds: Normal breath sounds. No wheezing.  Abdominal:     General: Bowel sounds are normal.     Palpations: Abdomen is soft.     Tenderness: There is no abdominal tenderness.  Musculoskeletal:        General: No swelling or tenderness.     Cervical back: Neck supple. No tenderness.  Lymphadenopathy:     Cervical: No cervical adenopathy.  Skin:    Findings: No erythema or rash.  Neurological:     Mental Status: She is alert.  Psychiatric:        Mood and Affect: Mood normal.        Behavior: Behavior normal.         Outpatient Encounter Medications as of 08/08/2024  Medication Sig   aspirin 81 MG chewable tablet Chew 81 mg by mouth daily.   azelastine  (ASTELIN ) 0.1 % nasal spray PLACE 1 SPRAY INTO BOTH NOSTRILS 2 (TWO) TIMES DAILY. USE IN EACH NOSTRIL AS DIRECTED   Biotin 5000 MCG TABS    Calcium-Magnesium -Vitamin D  (CALCIUM 500 PO) Take by mouth.   Cholecalciferol (VITAMIN D ) 50 MCG (2000 UT) tablet Take 2,000 Units by mouth daily.   ciclopirox  (LOPROX ) 0.77 % cream Apply topically daily.   clobetasol  cream (TEMOVATE ) 0.05 % Apply to elbows twice a day as needed. Avoid face, groin, axilla.   CRANBERRY PO Take 4,800 mg by mouth daily.   Cyanocobalamin  (B-12 SL) Place 500 mcg under the tongue daily.   [EXPIRED] doxycycline  (VIBRA -TABS) 100 MG tablet Take 1 tablet (100 mg total) by mouth 2 (two) times daily for 7 days.   famotidine  (PEPCID ) 20 MG tablet Take 1 tablet (20 mg total) by mouth at bedtime.   fish oil-omega-3 fatty acids 1000 MG capsule Take 1 g by mouth daily. (Patient taking differently: Take  500 mg by mouth daily.)   gabapentin  (NEURONTIN ) 300 MG capsule Take 1 capsule (300 mg total) by mouth at bedtime.   Glucosamine-Chondroitin 1500-1200 MG/30ML LIQD    Magnesium  250 MG TABS    MISC NATURAL PRODUCTS PO Herbal Supplement   Multiple Vitamin (MULTIVITAMIN) tablet Take 1 tablet by mouth daily.   mupirocin  ointment (BACTROBAN ) 2 % Apply topically 3 (three) times daily.   omeprazole  (PRILOSEC) 20 MG capsule Take 20 mg by mouth 2 (two) times daily.   Probiotic Product (PROBIOTIC PO) Take by mouth.   SANTYL 250 UNIT/GM ointment Apply 1 Application topically daily.   sildenafil (VIAGRA) 25 MG tablet Take 25 mg by mouth.   tacrolimus  (PROTOPIC ) 0.1 % ointment Apply topically twice daily for all over hands for dermatitis   Tavaborole  5 % SOLN APPLY TO AFFECTED TOENAIL EVERY NIGHT UNTIL IMPROVED.   busPIRone  (BUSPAR ) 7.5 MG tablet Take 1 tablet (7.5 mg total) by mouth 3 (three) times daily.   [DISCONTINUED] busPIRone  (BUSPAR ) 7.5 MG tablet TAKE 1 TABLET BY MOUTH 2 TIMES DAILY. (Patient taking differently: Take 7.5 mg by mouth 3 (three) times daily.)   [DISCONTINUED] sildenafil (REVATIO) 20 MG tablet Take 20 mg by mouth 2 (two) times daily.   No facility-administered encounter medications on file as of 08/08/2024.     Lab Results  Component Value Date   WBC 4.9 09/11/2023   HGB 13.4 09/11/2023   HCT 40.1 09/11/2023   PLT 205.0 09/11/2023   GLUCOSE 80 05/20/2024   CHOL 187 04/05/2024   TRIG 118.0 04/05/2024   HDL 55.70 04/05/2024  LDLDIRECT 113.0 10/24/2022   LDLCALC 108 (H) 04/05/2024   ALT 10 04/05/2024   AST 16 04/05/2024   NA 141 05/20/2024   K 4.4 05/20/2024   CL 105 05/20/2024   CREATININE 0.85 05/20/2024   BUN 10 05/20/2024   CO2 27 05/20/2024   TSH 2.20 01/03/2024    MM 3D SCREENING MAMMOGRAM BILATERAL BREAST Result Date: 02/12/2024 CLINICAL DATA:  Screening. EXAM: DIGITAL SCREENING BILATERAL MAMMOGRAM WITH TOMOSYNTHESIS AND CAD TECHNIQUE: Bilateral screening  digital craniocaudal and mediolateral oblique mammograms were obtained. Bilateral screening digital breast tomosynthesis was performed. The images were evaluated with computer-aided detection. COMPARISON:  Previous exam(s). ACR Breast Density Category b: There are scattered areas of fibroglandular density. FINDINGS: There are no findings suspicious for malignancy. IMPRESSION: No mammographic evidence of malignancy. A result letter of this screening mammogram will be mailed directly to the patient. RECOMMENDATION: Screening mammogram in one year. (Code:SM-B-01Y) BI-RADS CATEGORY  1: Negative. Electronically Signed   By: Reyes Phi M.D.   On: 02/12/2024 08:44       Assessment & Plan:  Anxiety Assessment & Plan: Continue buspar . Continue tid dosing. Follow.    Hypercholesteremia Assessment & Plan: The 10-year ASCVD risk score (Arnett DK, et al., 2019) is: 3.7%   Values used to calculate the score:     Age: 50 years     Clinically relevant sex: Female     Is Non-Hispanic African American: No     Diabetic: No     Tobacco smoker: No     Systolic Blood Pressure: 126 mmHg     Is BP treated: No     HDL Cholesterol: 55.7 mg/dL     Total Cholesterol: 187 mg/dL Low cholesterol diet and exercise. Follow lipid panel.   Orders: -     Lipid panel; Future -     Hepatic function panel; Future -     Basic metabolic panel with GFR; Future  Scleroderma (HCC) Assessment & Plan: Followed by rheumatology. Seeing Dr Tobie and Dr Loreli [facility]). Recent echo ok. Annual echos and PFTs - due 8-03/2025. Saw rheumatology 08/06/24 - continue baby aspriin and sildenafil - mild PAD. Does increased recently. Continue tacrolimus . Continue botox injections with dermatology.   Orders: -     Basic metabolic panel with GFR; Future  Gastroesophageal reflux disease with esophagitis without hemorrhage Assessment & Plan: Taking prilosec q am. Doing well. Symptoms controlled.    History of colonic polyps Assessment &  Plan: Colonoscopy 03/2021.    PAD (peripheral artery disease) Assessment & Plan: Saw vascular 08/16/23 - noninvasive studies showed improvement.  Recommended f/u in one year.    Other orders -     busPIRone  HCl; Take 1 tablet (7.5 mg total) by mouth 3 (three) times daily.  Dispense: 270 tablet; Refill: 1     Allena Hamilton, MD "

## 2024-08-11 ENCOUNTER — Encounter: Payer: Self-pay | Admitting: Internal Medicine

## 2024-08-11 NOTE — Assessment & Plan Note (Signed)
 Saw vascular 08/16/23 - noninvasive studies showed improvement. Recommended f/u in one year.

## 2024-08-11 NOTE — Assessment & Plan Note (Signed)
Colonoscopy 03/2021.  

## 2024-08-11 NOTE — Assessment & Plan Note (Addendum)
 The 10-year ASCVD risk score (Arnett DK, et al., 2019) is: 3.7%   Values used to calculate the score:     Age: 65 years     Clinically relevant sex: Female     Is Non-Hispanic African American: No     Diabetic: No     Tobacco smoker: No     Systolic Blood Pressure: 126 mmHg     Is BP treated: No     HDL Cholesterol: 55.7 mg/dL     Total Cholesterol: 187 mg/dL Low cholesterol diet and exercise. Follow lipid panel.

## 2024-08-11 NOTE — Assessment & Plan Note (Signed)
 Followed by rheumatology. Seeing Dr Tobie and Dr Loreli [facility]). Recent echo ok. Annual echos and PFTs - due 8-03/2025. Saw rheumatology 08/06/24 - continue baby aspriin and sildenafil - mild PAD. Does increased recently. Continue tacrolimus . Continue botox injections with dermatology.

## 2024-08-11 NOTE — Assessment & Plan Note (Signed)
 Continue buspar . Continue tid dosing. Follow.

## 2024-08-11 NOTE — Assessment & Plan Note (Signed)
 Taking prilosec q am. Doing well. Symptoms controlled.

## 2024-08-15 ENCOUNTER — Other Ambulatory Visit (INDEPENDENT_AMBULATORY_CARE_PROVIDER_SITE_OTHER): Payer: Self-pay | Admitting: Nurse Practitioner

## 2024-08-15 DIAGNOSIS — I7301 Raynaud's syndrome with gangrene: Secondary | ICD-10-CM

## 2024-08-15 DIAGNOSIS — I739 Peripheral vascular disease, unspecified: Secondary | ICD-10-CM

## 2024-08-16 ENCOUNTER — Encounter (INDEPENDENT_AMBULATORY_CARE_PROVIDER_SITE_OTHER): Payer: Self-pay | Admitting: Nurse Practitioner

## 2024-08-16 ENCOUNTER — Ambulatory Visit (INDEPENDENT_AMBULATORY_CARE_PROVIDER_SITE_OTHER): Payer: 59

## 2024-08-16 ENCOUNTER — Ambulatory Visit (INDEPENDENT_AMBULATORY_CARE_PROVIDER_SITE_OTHER): Payer: 59 | Admitting: Nurse Practitioner

## 2024-08-16 VITALS — BP 112/70 | HR 63 | Resp 18 | Wt 160.0 lb

## 2024-08-16 DIAGNOSIS — I7300 Raynaud's syndrome without gangrene: Secondary | ICD-10-CM | POA: Diagnosis not present

## 2024-08-16 DIAGNOSIS — I7301 Raynaud's syndrome with gangrene: Secondary | ICD-10-CM

## 2024-08-16 DIAGNOSIS — I739 Peripheral vascular disease, unspecified: Secondary | ICD-10-CM | POA: Diagnosis not present

## 2024-08-19 ENCOUNTER — Encounter (INDEPENDENT_AMBULATORY_CARE_PROVIDER_SITE_OTHER): Payer: Self-pay | Admitting: Nurse Practitioner

## 2024-08-22 LAB — VAS US ABI WITH/WO TBI
Left ABI: 1.12
Right ABI: 1.15

## 2024-10-11 ENCOUNTER — Ambulatory Visit (INDEPENDENT_AMBULATORY_CARE_PROVIDER_SITE_OTHER): Admitting: Nurse Practitioner

## 2024-10-11 ENCOUNTER — Encounter (INDEPENDENT_AMBULATORY_CARE_PROVIDER_SITE_OTHER)

## 2024-10-14 ENCOUNTER — Ambulatory Visit: Admitting: Internal Medicine

## 2025-01-07 ENCOUNTER — Ambulatory Visit: Admitting: Dermatology

## 2025-07-22 ENCOUNTER — Ambulatory Visit: Admitting: Dermatology
